# Patient Record
Sex: Female | Born: 1960 | Race: White | Hispanic: No | Smoking: Former smoker
Health system: Southern US, Community
[De-identification: ages and names within clinical notes are randomized; demographics above are authoritative.]

## PROBLEM LIST (undated history)

## (undated) DIAGNOSIS — D131 Benign neoplasm of stomach: Secondary | ICD-10-CM

## (undated) DIAGNOSIS — F419 Anxiety disorder, unspecified: Secondary | ICD-10-CM

## (undated) DIAGNOSIS — R42 Dizziness and giddiness: Secondary | ICD-10-CM

## (undated) DIAGNOSIS — M1990 Unspecified osteoarthritis, unspecified site: Secondary | ICD-10-CM

## (undated) DIAGNOSIS — Z9221 Personal history of antineoplastic chemotherapy: Secondary | ICD-10-CM

## (undated) DIAGNOSIS — K219 Gastro-esophageal reflux disease without esophagitis: Secondary | ICD-10-CM

## (undated) DIAGNOSIS — C569 Malignant neoplasm of unspecified ovary: Secondary | ICD-10-CM

## (undated) DIAGNOSIS — K8020 Calculus of gallbladder without cholecystitis without obstruction: Secondary | ICD-10-CM

## (undated) HISTORY — PX: DIAGNOSTIC LAPAROSCOPY: SUR761

## (undated) HISTORY — PX: LAPAROSCOPIC OOPHERECTOMY: SHX6507

## (undated) HISTORY — DX: Unspecified osteoarthritis, unspecified site: M19.90

## (undated) HISTORY — DX: Dizziness and giddiness: R42

## (undated) HISTORY — DX: Malignant neoplasm of unspecified ovary: C56.9

## (undated) HISTORY — PX: CHOLECYSTECTOMY: SHX55

## (undated) HISTORY — PX: COLONOSCOPY: SHX5424

## (undated) HISTORY — PX: UPPER GI ENDOSCOPY: SHX6162

## (undated) HISTORY — DX: Gastro-esophageal reflux disease without esophagitis: K21.9

## (undated) HISTORY — DX: Benign neoplasm of stomach: D13.1

## (undated) HISTORY — DX: Calculus of gallbladder without cholecystitis without obstruction: K80.20

## (undated) HISTORY — PX: OOPHORECTOMY: SHX86

## (undated) HISTORY — DX: Anxiety disorder, unspecified: F41.9

---

## 1999-06-30 ENCOUNTER — Other Ambulatory Visit: Admission: RE | Admit: 1999-06-30 | Discharge: 1999-06-30 | Payer: Self-pay | Admitting: Obstetrics and Gynecology

## 2000-09-28 ENCOUNTER — Other Ambulatory Visit: Admission: RE | Admit: 2000-09-28 | Discharge: 2000-09-28 | Payer: Self-pay | Admitting: Obstetrics and Gynecology

## 2001-10-16 ENCOUNTER — Other Ambulatory Visit: Admission: RE | Admit: 2001-10-16 | Discharge: 2001-10-16 | Payer: Self-pay | Admitting: Obstetrics and Gynecology

## 2002-11-06 ENCOUNTER — Other Ambulatory Visit: Admission: RE | Admit: 2002-11-06 | Discharge: 2002-11-06 | Payer: Self-pay | Admitting: Obstetrics and Gynecology

## 2003-12-10 ENCOUNTER — Other Ambulatory Visit: Admission: RE | Admit: 2003-12-10 | Discharge: 2003-12-10 | Payer: Self-pay | Admitting: Obstetrics and Gynecology

## 2004-03-31 ENCOUNTER — Ambulatory Visit: Payer: Self-pay | Admitting: Family Medicine

## 2004-06-03 ENCOUNTER — Emergency Department (HOSPITAL_COMMUNITY): Admission: EM | Admit: 2004-06-03 | Discharge: 2004-06-03 | Payer: Self-pay | Admitting: Emergency Medicine

## 2004-09-10 ENCOUNTER — Ambulatory Visit: Payer: Self-pay | Admitting: Internal Medicine

## 2005-02-17 ENCOUNTER — Other Ambulatory Visit: Admission: RE | Admit: 2005-02-17 | Discharge: 2005-02-17 | Payer: Self-pay | Admitting: Obstetrics and Gynecology

## 2006-03-13 ENCOUNTER — Ambulatory Visit: Payer: Self-pay | Admitting: Family Medicine

## 2006-03-15 ENCOUNTER — Ambulatory Visit: Payer: Self-pay | Admitting: Family Medicine

## 2006-08-23 DIAGNOSIS — H019 Unspecified inflammation of eyelid: Secondary | ICD-10-CM | POA: Insufficient documentation

## 2006-08-25 ENCOUNTER — Ambulatory Visit: Payer: Self-pay | Admitting: *Deleted

## 2006-08-25 DIAGNOSIS — R42 Dizziness and giddiness: Secondary | ICD-10-CM | POA: Insufficient documentation

## 2006-12-07 ENCOUNTER — Ambulatory Visit: Payer: Self-pay | Admitting: Family Medicine

## 2006-12-07 DIAGNOSIS — S90129A Contusion of unspecified lesser toe(s) without damage to nail, initial encounter: Secondary | ICD-10-CM | POA: Insufficient documentation

## 2006-12-07 DIAGNOSIS — L408 Other psoriasis: Secondary | ICD-10-CM | POA: Insufficient documentation

## 2007-05-01 ENCOUNTER — Telehealth: Payer: Self-pay | Admitting: Family Medicine

## 2007-05-01 ENCOUNTER — Ambulatory Visit: Payer: Self-pay | Admitting: Family Medicine

## 2007-05-01 DIAGNOSIS — K219 Gastro-esophageal reflux disease without esophagitis: Secondary | ICD-10-CM | POA: Insufficient documentation

## 2007-07-02 ENCOUNTER — Ambulatory Visit: Payer: Self-pay | Admitting: Family Medicine

## 2007-07-02 DIAGNOSIS — H6990 Unspecified Eustachian tube disorder, unspecified ear: Secondary | ICD-10-CM | POA: Insufficient documentation

## 2007-07-02 DIAGNOSIS — H6980 Other specified disorders of Eustachian tube, unspecified ear: Secondary | ICD-10-CM | POA: Insufficient documentation

## 2008-01-02 ENCOUNTER — Ambulatory Visit: Payer: Self-pay | Admitting: Family Medicine

## 2008-01-02 DIAGNOSIS — R300 Dysuria: Secondary | ICD-10-CM | POA: Insufficient documentation

## 2008-01-02 LAB — CONVERTED CEMR LAB
Bilirubin Urine: NEGATIVE
Blood in Urine, dipstick: NEGATIVE
Glucose, Urine, Semiquant: NEGATIVE
Ketones, urine, test strip: NEGATIVE
Protein, U semiquant: NEGATIVE
Specific Gravity, Urine: 1.015
WBC Urine, dipstick: NEGATIVE
pH: 5

## 2008-01-04 ENCOUNTER — Encounter: Payer: Self-pay | Admitting: Family Medicine

## 2008-11-05 ENCOUNTER — Ambulatory Visit: Payer: Self-pay | Admitting: Family Medicine

## 2008-11-05 LAB — CONVERTED CEMR LAB
Bilirubin Urine: NEGATIVE
Glucose, Urine, Semiquant: NEGATIVE
Ketones, urine, test strip: NEGATIVE
RBC / HPF: 0
Specific Gravity, Urine: <1.005
pH: 7

## 2008-11-06 ENCOUNTER — Encounter: Payer: Self-pay | Admitting: Family Medicine

## 2008-11-21 ENCOUNTER — Ambulatory Visit: Payer: Self-pay | Admitting: Family Medicine

## 2008-11-21 DIAGNOSIS — M542 Cervicalgia: Secondary | ICD-10-CM | POA: Insufficient documentation

## 2009-01-23 ENCOUNTER — Telehealth: Payer: Self-pay | Admitting: Family Medicine

## 2010-06-11 ENCOUNTER — Telehealth: Payer: Self-pay | Admitting: Family Medicine

## 2010-06-15 NOTE — Progress Notes (Signed)
Summary: cough  Phone Note Call from Patient Call back at Home Phone [phone]   Caller: Patient Call For: Dr. Sharen Hones  Summary of Call: Patient says that she has had a dry cough that started early this week, is having no other symptoms. She is asking what she should take. Uses [facility] if needed.  Initial call taken by: Melody Comas,  June 11, 2010 8:54 AM  Follow-up for Phone Call        may try delsym or robitussin OTC.  no h/o HTN, unsure if smoker.  if not better, let us know. Follow-up by: Eustaquio Boyden  MD,  June 11, 2010 10:32 AM  Additional Follow-up for Phone Call Additional follow up Details #1::        Patient notified.  Additional Follow-up by: Melody Comas,  June 11, 2010 10:33 AM       Past History:  Past Medical History: Last updated: 07/02/2007 Dizziness or vertigo GERD PMH-FH-SH reviewed for relevance

## 2010-07-23 ENCOUNTER — Other Ambulatory Visit: Payer: Self-pay | Admitting: *Deleted

## 2010-07-23 MED ORDER — PANTOPRAZOLE SODIUM 40 MG PO TBEC: 40.0000 mg | DELAYED_RELEASE_TABLET | Freq: Every day | ORAL | Status: DC

## 2010-08-07 ENCOUNTER — Encounter: Payer: Self-pay | Admitting: Family Medicine

## 2010-08-10 ENCOUNTER — Ambulatory Visit: Payer: Self-pay | Admitting: Family Medicine

## 2010-08-13 ENCOUNTER — Encounter: Payer: Self-pay | Admitting: Family Medicine

## 2010-08-13 ENCOUNTER — Ambulatory Visit (INDEPENDENT_AMBULATORY_CARE_PROVIDER_SITE_OTHER): Payer: BC Managed Care – PPO | Admitting: Family Medicine

## 2010-08-13 DIAGNOSIS — K209 Esophagitis, unspecified without bleeding: Secondary | ICD-10-CM

## 2010-08-13 MED ORDER — PANTOPRAZOLE SODIUM 40 MG PO TBEC: 40.0000 mg | DELAYED_RELEASE_TABLET | Freq: Every day | ORAL | Status: DC

## 2010-08-13 NOTE — Assessment & Plan Note (Signed)
Doing well with protonix- does become symptomatic if she stops it  Will refil Disc inc in risk of OP with PPIs- and she voiced understanding  Will up the ca and vit D for that  Will disc screening with her gyn  refil for the year Disc diet for gerd  Will try to mt wt and exercise

## 2010-08-13 NOTE — Progress Notes (Signed)
  Subjective:    Patient ID: Debbie Richardson, female    DOB: [birth year], 50 y.o.   MRN: [id number]  HPI Here for f/u of GERD Needs a refil on her protonix -it works great  She can tell a big difference in how she feels - no more burning (symptoms were pretty severe in past)  If she goes off of it - entire digestive tract is messed up   Takes calcium and drinks a lot of milk  That has vitamin D in it   Sees gyn for annual exam and pap and OC - Dr Henderson Cloud  Going through some perimenopause -- and still has menses on her OC  Tried a different pill with less estrogen and that did not work --so back on yaz   Past Medical History  Diagnosis Date  . Dizziness     vertigo  . GERD (gastroesophageal reflux disease)     History   Social History  . Marital Status: Single    Spouse Name: N/A    Number of Children: 1  . Years of Education: N/A   Occupational History  . Not on file.   Social History Main Topics  . Smoking status: Never Smoker   . Smokeless tobacco: Not on file  . Alcohol Use: Not on file  . Drug Use: Not on file  . Sexually Active: Not on file   Other Topics Concern  . Not on file   Social History Narrative  . No narrative on file    Family History  Problem Relation Age of Onset  . Hypertension Mother   . Hyperlipidemia Mother   . GER disease Mother         Review of Systems Review of Systems  Constitutional: Negative for fever, appetite change, fatigue and unexpected weight change.  Eyes: Negative for pain and visual disturbance.  Respiratory: Negative for cough and shortness of breath.   Cardiovascular: Negative.   Gastrointestinal: Negative for nausea, diarrhea and constipation.neg for trouble swallowing   Genitourinary: Negative for urgency and frequency.  Skin: Negative for pallor.  Neurological: Negative for weakness, light-headedness, numbness and headaches.  Hematological: Negative for adenopathy. Does not bruise/bleed easily.    Psychiatric/Behavioral: Negative for dysphoric mood. The patient is not nervous/anxious.          Objective:   Physical Exam  Constitutional: She appears well-developed and well-nourished.  HENT:  Head: Normocephalic and atraumatic.  Mouth/Throat: Oropharynx is clear and moist.  Eyes: Conjunctivae and EOM are normal. Pupils are equal, round, and reactive to light.  Neck: No JVD present. No thyromegaly present.  Cardiovascular: Normal rate and regular rhythm.   Pulmonary/Chest: Effort normal and breath sounds normal. She has no wheezes.  Abdominal: Soft. Bowel sounds are normal. She exhibits no distension and no mass. There is no tenderness.  Lymphadenopathy:    She has no cervical adenopathy.  Neurological: She is alert. She has normal reflexes.  Skin: Skin is warm and dry.  Psychiatric: She has a normal mood and affect.          Assessment & Plan:

## 2010-08-13 NOTE — Patient Instructions (Signed)
Continue the protonix Try to get 1200-1500 mg of calcium per day with at least 1000 iu of vitamin D - for bone health  Avoid spicy foods and caffeine / and acidic foods with acid reflux Let me know if any problems

## 2011-04-01 ENCOUNTER — Ambulatory Visit (INDEPENDENT_AMBULATORY_CARE_PROVIDER_SITE_OTHER): Payer: BC Managed Care – PPO | Admitting: Family Medicine

## 2011-04-01 ENCOUNTER — Encounter: Payer: Self-pay | Admitting: Family Medicine

## 2011-04-01 VITALS — BP 110/80 | HR 85 | Temp 97.7°F | Wt 130.0 lb

## 2011-04-01 DIAGNOSIS — J069 Acute upper respiratory infection, unspecified: Secondary | ICD-10-CM

## 2011-04-01 MED ORDER — AZITHROMYCIN 250 MG PO TABS: ORAL_TABLET | ORAL | Status: AC

## 2011-04-01 NOTE — Progress Notes (Signed)
SUBJECTIVE:  Debbie Richardson is a 50 y.o. female who complains of coryza, congestion and bilateral sinus pain for 25 days. She denies a history of anorexia, chest pain, fatigue, shortness of breath, weakness, weight loss and wheezing and denies a history of asthma. Patient denies smoke cigarettes.   Patient Active Problem List  Diagnoses  . GERD  . PSORIASIS NEC  . NECK PAIN   Past Medical History  Diagnosis Date  . Dizziness     vertigo  . GERD (gastroesophageal reflux disease)    Past Surgical History  Procedure Date  . Cesarean section   . Cholecystectomy    History  Substance Use Topics  . Smoking status: Never Smoker   . Smokeless tobacco: Not on file  . Alcohol Use: Not on file   Family History  Problem Relation Age of Onset  . Hypertension Mother   . Hyperlipidemia Mother   . GER disease Mother    No Known Allergies Current Outpatient Prescriptions on File Prior to Visit  Medication Sig Dispense Refill  . drospirenone-ethinyl estradiol (YAZ) 3-0.02 MG per tablet Take 1 tablet by mouth daily.        . pantoprazole (PROTONIX) 40 MG tablet Take 1 tablet (40 mg total) by mouth daily.  30 tablet  11   The PMH, PSH, Social History, Family History, Medications, and allergies have been reviewed in [facility], and have been updated if relevant.  OBJECTIVE: BP 110/80  Pulse 85  Temp(Src) 97.7 F (36.5 C) (Oral)  Wt 130 lb (58.968 kg)  LMP 02/28/2011  She appears well, vital signs are as noted. TMs retracted bilaterally.  Throat and pharynx normal.  Neck supple. No adenopathy in the neck. Nose is congested. Sinuses tender to palp throughout. The chest is clear, without wheezes or rales.  ASSESSMENT:  sinusitis  PLAN: Given duration and progression of symptoms, will treat for bacterial sinusitis with Zpack (states she cannot tolerate GI side effects of amox or augmentin). Symptomatic therapy suggested: push fluids, rest and return office visit prn if symptoms persist or  worsen. Call or return to clinic prn if these symptoms worsen or fail to improve as anticipated.

## 2011-04-01 NOTE — Patient Instructions (Signed)
Hope you feel better soon.  Have a Happy New Year.  Take antibiotic as directed.  Drink lots of fluids.  Treat sympotmatically with Mucinex, nasal saline irrigation, and Tylenol/Ibuprofen. Also try claritin D or zyrtec D over the counter- two times a day as needed ( have to sign for them at pharmacy). You can use warm compresses.  Cough suppressant at night. Call if not improving as expected in 5-7 days.

## 2011-09-19 ENCOUNTER — Other Ambulatory Visit: Payer: Self-pay | Admitting: Family Medicine

## 2011-09-19 NOTE — Telephone Encounter (Signed)
Done 30-day supply; Pt overdue for OV/SLS

## 2011-11-09 ENCOUNTER — Other Ambulatory Visit: Payer: Self-pay | Admitting: Family Medicine

## 2011-11-09 NOTE — Telephone Encounter (Signed)
Last OV was 04/01/11. Last filled 09/19/11. Ok to refill?

## 2011-11-09 NOTE — Telephone Encounter (Signed)
Will refill electronically  

## 2011-12-16 ENCOUNTER — Encounter: Payer: Self-pay | Admitting: Internal Medicine

## 2011-12-16 ENCOUNTER — Ambulatory Visit (INDEPENDENT_AMBULATORY_CARE_PROVIDER_SITE_OTHER): Payer: BC Managed Care – PPO | Admitting: Internal Medicine

## 2011-12-16 VITALS — BP 112/72 | HR 86 | Temp 98.3°F | Wt 126.2 lb

## 2011-12-16 DIAGNOSIS — R300 Dysuria: Secondary | ICD-10-CM

## 2011-12-16 DIAGNOSIS — N390 Urinary tract infection, site not specified: Secondary | ICD-10-CM | POA: Insufficient documentation

## 2011-12-16 LAB — POCT URINALYSIS DIPSTICK: Spec Grav, UA: 1.01

## 2011-12-16 MED ORDER — CIPROFLOXACIN HCL 500 MG PO TABS: 500.0000 mg | ORAL_TABLET | Freq: Two times a day (BID) | ORAL | Status: AC

## 2011-12-16 NOTE — Assessment & Plan Note (Signed)
Not clear if cystitis only or UTI Will treat with cipro If goes away right away, may only need 3 days

## 2011-12-16 NOTE — Progress Notes (Signed)
  Subjective:    Patient ID: Debbie Richardson, female    DOB: [birth year], 51 y.o.   MRN: [id number]  HPI Feels like she has UTI Started with suprapubic pain and pressure for 2--3 days Tried some azo---helped and then stopped---the pain then recurred   Has had sporadic cystitis in past May have had low grade temp today May have seen a little blood this AM  Some burning dysuria Urgency also Has increased her fluids  Current Outpatient Prescriptions on File Prior to Visit  Medication Sig Dispense Refill  . drospirenone-ethinyl estradiol (YAZ) 3-0.02 MG per tablet Take 1 tablet by mouth daily.        . pantoprazole (PROTONIX) 40 MG tablet TAKE ONE (1) TABLET BY MOUTH EVERY      DAY  30 tablet  5    No Known Allergies  Past Medical History  Diagnosis Date  . Dizziness     vertigo  . GERD (gastroesophageal reflux disease)     Past Surgical History  Procedure Date  . Cesarean section   . Cholecystectomy     Family History  Problem Relation Age of Onset  . Hypertension Mother   . Hyperlipidemia Mother   . GER disease Mother     History   Social History  . Marital Status: Single    Spouse Name: N/A    Number of Children: 1  . Years of Education: N/A   Occupational History  . Not on file.   Social History Main Topics  . Smoking status: Never Smoker   . Smokeless tobacco: Not on file  . Alcohol Use: Not on file  . Drug Use: Not on file  . Sexually Active: Not on file   Other Topics Concern  . Not on file   Social History Narrative  . No narrative on file   Review of Systems No nausea or vomiting Last intercourse about 2 weeks ago--no problems Periods still regular    Objective:   Physical Exam  Constitutional: She appears well-developed and well-nourished. No distress.  Abdominal: Soft. She exhibits no distension.       Mild suprapubic tenderness  Musculoskeletal:       Slight left CVA tenderness          Assessment & Plan:

## 2012-05-07 ENCOUNTER — Ambulatory Visit (INDEPENDENT_AMBULATORY_CARE_PROVIDER_SITE_OTHER): Payer: BC Managed Care – PPO | Admitting: Family Medicine

## 2012-05-07 ENCOUNTER — Encounter: Payer: Self-pay | Admitting: Family Medicine

## 2012-05-07 VITALS — BP 122/82 | HR 74 | Temp 98.2°F | Ht 62.5 in | Wt 126.8 lb

## 2012-05-07 DIAGNOSIS — B309 Viral conjunctivitis, unspecified: Secondary | ICD-10-CM

## 2012-05-07 MED ORDER — PANTOPRAZOLE SODIUM 40 MG PO TBEC: 40.0000 mg | DELAYED_RELEASE_TABLET | Freq: Every day | ORAL | Status: DC

## 2012-05-07 NOTE — Assessment & Plan Note (Signed)
Suspect assoc with mild uri  Disc symptomatic care - see instructions on AVS  If worse - disc s/s of bacterial infx to watch for incl swelling/ colored eye d/c and inc pain  Update if not starting to improve in a week or if worsening

## 2012-05-07 NOTE — Progress Notes (Signed)
  Subjective:    Patient ID: Debbie Richardson, female    DOB: 05/30/1960, 52 y.o.   MRN: [id number]  HPI Here for eye problems   Started to bother her last week  R worse than the left  Itching and burning and also some swelling under the eye (puffiness) Had some sinus symptoms that are improved  Not too red Some discharge also dry crusting in the am   Used some allergy drops  No vision change   No known pink eye exposure   Patient Active Problem List  Diagnosis  . GERD  . PSORIASIS NEC  . NECK PAIN  . UTI (lower urinary tract infection)   Past Medical History  Diagnosis Date  . Dizziness     vertigo  . GERD (gastroesophageal reflux disease)    Past Surgical History  Procedure Date  . Cesarean section   . Cholecystectomy    History  Substance Use Topics  . Smoking status: Never Smoker   . Smokeless tobacco: Not on file  . Alcohol Use: No   Family History  Problem Relation Age of Onset  . Hypertension Mother   . Hyperlipidemia Mother   . GER disease Mother    No Known Allergies Current Outpatient Prescriptions on File Prior to Visit  Medication Sig Dispense Refill  . levonorgestrel-ethinyl estradiol (PORTIA-28) 0.15-30 MG-MCG tablet Take 1 tablet by mouth daily.      . pantoprazole (PROTONIX) 40 MG tablet TAKE ONE (1) TABLET BY MOUTH EVERY      DAY  30 tablet  5      Review of Systems Review of Systems  Constitutional: Negative for fever, appetite change, fatigue and unexpected weight change.  Eyes: Negative for pain and visual disturbance. pos for irritation/ redness and d/c ENT pos for some congestion and post nasal drip  Respiratory: Negative for cough and shortness of breath.   Cardiovascular: Negative for cp or palpitations    Gastrointestinal: Negative for nausea, diarrhea and constipation.  Genitourinary: Negative for urgency and frequency.  Skin: Negative for pallor or rash   Neurological: Negative for weakness, light-headedness, numbness and  headaches.  Hematological: Negative for adenopathy. Does not bruise/bleed easily.  Psychiatric/Behavioral: Negative for dysphoric mood. The patient is not nervous/anxious.         Objective:   Physical Exam  Constitutional: She appears well-developed and well-nourished. No distress.  HENT:  Head: Normocephalic and atraumatic.  Right Ear: External ear normal.  Left Ear: External ear normal.  Mouth/Throat: Oropharynx is clear and moist. No oropharyngeal exudate.       Nares are boggy  Eyes: EOM are normal. Pupils are equal, round, and reactive to light. Right eye exhibits discharge. Left eye exhibits discharge. No scleral icterus.       bilat conj injection (mild) worse on R Clear d/c and tearing  She is wearing eye makeup Nl vision No lid swelling or redness   Neck: Normal range of motion. Neck supple.  Cardiovascular: Normal rate and regular rhythm.   Pulmonary/Chest: Effort normal and breath sounds normal. She has no wheezes.  Lymphadenopathy:    She has no cervical adenopathy.  Neurological: She is alert.  Skin: Skin is warm and dry. No rash noted.  Psychiatric: She has a normal mood and affect.          Assessment & Plan:

## 2012-05-07 NOTE — Patient Instructions (Addendum)
I think you have viral conjunctivitis  Use saline / artificial tears  Wash hands frequently and no not touch eyes  Throw out any eye makeup you have worn and buy new when you are better  Cool compresses help itch and irritation Warm washcloth in am - will help get the gunk out of your lashes  If worse/ pain/swelling/ discharge or any vision change - call and let me know

## 2012-07-04 ENCOUNTER — Ambulatory Visit (INDEPENDENT_AMBULATORY_CARE_PROVIDER_SITE_OTHER): Payer: BC Managed Care – PPO | Admitting: Family Medicine

## 2012-07-04 ENCOUNTER — Encounter: Payer: Self-pay | Admitting: Family Medicine

## 2012-07-04 VITALS — BP 110/70 | HR 92 | Temp 98.4°F | Ht 62.5 in | Wt 128.0 lb

## 2012-07-04 DIAGNOSIS — H1013 Acute atopic conjunctivitis, bilateral: Secondary | ICD-10-CM

## 2012-07-04 DIAGNOSIS — J309 Allergic rhinitis, unspecified: Secondary | ICD-10-CM

## 2012-07-04 DIAGNOSIS — H1010 Acute atopic conjunctivitis, unspecified eye: Secondary | ICD-10-CM | POA: Insufficient documentation

## 2012-07-04 DIAGNOSIS — J019 Acute sinusitis, unspecified: Secondary | ICD-10-CM

## 2012-07-04 DIAGNOSIS — B9689 Other specified bacterial agents as the cause of diseases classified elsewhere: Secondary | ICD-10-CM

## 2012-07-04 MED ORDER — OLOPATADINE HCL 0.2 % OP SOLN: 1.0000 [drp] | Freq: Two times a day (BID) | OPHTHALMIC | Status: DC

## 2012-07-04 MED ORDER — FLUTICASONE PROPIONATE 50 MCG/ACT NA SUSP: 2.0000 | Freq: Every day | NASAL | Status: DC

## 2012-07-04 MED ORDER — AMOXICILLIN 500 MG PO CAPS: 500.0000 mg | ORAL_CAPSULE | Freq: Three times a day (TID) | ORAL | Status: DC

## 2012-07-04 NOTE — Patient Instructions (Addendum)
Use the eye drops twice daily one drop in each eye - for allergic conjunctivitis Try the flonase for allergies  Take the amoxicillin for sinus infection Update if not starting to improve in a week or if worsening

## 2012-07-04 NOTE — Assessment & Plan Note (Signed)
Ongoing intermittent mild eye itching and also congestion / drip Trial of flonase since pt does not like how dry she gets with claritin pataday drops Update if worse or not imp

## 2012-07-04 NOTE — Progress Notes (Signed)
Subjective:    Patient ID: Debbie Richardson, female    DOB: 02/05/1961, 52 y.o.   MRN: [id number]  HPI Her symptoms of presumed viral conjunctivitis - waxed and waned and not totally gone In the interim seen by dentist for facial pain - wonders about sinus or allergy problems   Worse in the L eye , is getting cloudy d/c from it - not crusting shut  At times is puffy (aleve helps this )  Other eye is a little irritated  Lower lids are the biggest problem Has thrown out all makeup   Last week thought she has a bit of a cold ? Allergies Took claritin - dried her out too much   Constant nose blowing  Itchy nose and throat  Throat feels full/not sore Ears were full last week   A little yellow nasal discharge  Sinus pain and pressure in R maxillary sinus -this continues with pressure in gums and mouth   No fever   Patient Active Problem List  Diagnosis  . GERD  . PSORIASIS NEC  . Viral conjunctivitis   Past Medical History  Diagnosis Date  . Dizziness     vertigo  . GERD (gastroesophageal reflux disease)    Past Surgical History  Procedure Laterality Date  . Cesarean section    . Cholecystectomy     History  Substance Use Topics  . Smoking status: Never Smoker   . Smokeless tobacco: Not on file  . Alcohol Use: No   Family History  Problem Relation Age of Onset  . Hypertension Mother   . Hyperlipidemia Mother   . GER disease Mother    No Known Allergies Current Outpatient Prescriptions on File Prior to Visit  Medication Sig Dispense Refill  . levonorgestrel-ethinyl estradiol (PORTIA-28) 0.15-30 MG-MCG tablet Take 1 tablet by mouth daily.      . pantoprazole (PROTONIX) 40 MG tablet Take 1 tablet (40 mg total) by mouth daily.  30 tablet  11   No current facility-administered medications on file prior to visit.      Review of Systems Review of Systems  Constitutional: Negative for fever, appetite change, fatigue and unexpected weight change.  ENT pos for  congestion/ facial puffiness/ sinus pain , neg for ear pain or st Eyes: Negative for pain and visual disturbance. pos for itching and slt redness of eyes Respiratory: Negative for cough and shortness of breath.  neg for wheeze  Cardiovascular: Negative for cp or palpitations    Gastrointestinal: Negative for nausea, diarrhea and constipation.  Genitourinary: Negative for urgency and frequency.  Skin: Negative for pallor or rash   Neurological: Negative for weakness, light-headedness, numbness and headaches.  Hematological: Negative for adenopathy. Does not bruise/bleed easily.  Psychiatric/Behavioral: Negative for dysphoric mood. The patient is not nervous/anxious.         Objective:   Physical Exam  Constitutional: She appears well-developed and well-nourished. No distress.  HENT:  Head: Normocephalic and atraumatic.  Right Ear: External ear normal.  Left Ear: External ear normal.  Mouth/Throat: Oropharynx is clear and moist. No oropharyngeal exudate.  Nares are injected and congested  R ethmoid and maxillary sinus tenderness Some clear rhinorrhea   Eyes: EOM are normal. Pupils are equal, round, and reactive to light. Right eye exhibits no discharge. Left eye exhibits no discharge. No scleral icterus.  Very mild conj injection bilat  R lower lid is slt pink No dishcarge  Neck: Normal range of motion. Neck supple.  Cardiovascular: Normal rate,  regular rhythm and normal heart sounds.   Pulmonary/Chest: Effort normal and breath sounds normal. No respiratory distress. She has no wheezes. She has no rales.  Musculoskeletal: She exhibits no edema.  Lymphadenopathy:    She has no cervical adenopathy.  Neurological: She is alert. No cranial nerve deficit.  Skin: Skin is warm and dry. No rash noted. No erythema. No pallor.  Psychiatric: She has a normal mood and affect.          Assessment & Plan:

## 2012-07-04 NOTE — Assessment & Plan Note (Signed)
Seems to be R maxillary sinus -pain/ pressure and some purulent d/c in setting of several uri and also all rhinitis tx with amox (augmentin upsets stomach) Disc symptomatic care - see instructions on AVS  Update if not starting to improve in a week or if worsening   Will also try flonase for congestion

## 2013-03-19 ENCOUNTER — Other Ambulatory Visit: Payer: Self-pay | Admitting: Obstetrics and Gynecology

## 2013-03-19 DIAGNOSIS — R928 Other abnormal and inconclusive findings on diagnostic imaging of breast: Secondary | ICD-10-CM

## 2013-04-02 ENCOUNTER — Ambulatory Visit
Admission: RE | Admit: 2013-04-02 | Discharge: 2013-04-02 | Disposition: A | Discharge status: Home / Self Care | Payer: BC Managed Care – PPO | Source: Ambulatory Visit | Attending: Obstetrics and Gynecology | Admitting: Obstetrics and Gynecology

## 2013-04-02 DIAGNOSIS — R928 Other abnormal and inconclusive findings on diagnostic imaging of breast: Secondary | ICD-10-CM

## 2013-07-10 ENCOUNTER — Other Ambulatory Visit: Payer: Self-pay | Admitting: Family Medicine

## 2013-07-11 NOTE — Telephone Encounter (Signed)
Pt does not have any recent appts or future appts scheduled, electronic RX request. Please advise.

## 2013-07-11 NOTE — Telephone Encounter (Signed)
Please refill for 6 month

## 2013-07-12 NOTE — Telephone Encounter (Signed)
done

## 2013-08-19 ENCOUNTER — Ambulatory Visit (INDEPENDENT_AMBULATORY_CARE_PROVIDER_SITE_OTHER): Payer: BC Managed Care – PPO | Admitting: Family Medicine

## 2013-08-19 ENCOUNTER — Encounter: Payer: Self-pay | Admitting: Family Medicine

## 2013-08-19 VITALS — BP 110/64 | HR 78 | Temp 98.5°F | Ht 62.5 in | Wt 132.0 lb

## 2013-08-19 DIAGNOSIS — Z23 Encounter for immunization: Secondary | ICD-10-CM

## 2013-08-19 DIAGNOSIS — S61411A Laceration without foreign body of right hand, initial encounter: Secondary | ICD-10-CM | POA: Insufficient documentation

## 2013-08-19 DIAGNOSIS — S61409A Unspecified open wound of unspecified hand, initial encounter: Secondary | ICD-10-CM

## 2013-08-19 MED ORDER — CEPHALEXIN 500 MG PO CAPS: 500.0000 mg | ORAL_CAPSULE | Freq: Three times a day (TID) | ORAL | Status: DC

## 2013-08-19 NOTE — Assessment & Plan Note (Signed)
From injury over 3 days ago  Is clean with scant redness Update Tdap  tx with keflex  Reassuring exam Disc dressing /will use abx ointment and clean with abx soap and water Adv not to submerge  Update if not starting to improve in a week or if worsening

## 2013-08-19 NOTE — Patient Instructions (Signed)
Keep wound clean with antibacterial soap and water  Use antibiotic ointment and cover it lightly to protect it  Take keflex as directed  If worse/red/swollen/drainge or fever - please alert me  Tetanus shot today

## 2013-08-19 NOTE — Progress Notes (Signed)
   Subjective:    Patient ID: Debbie Richardson, female    DOB: 10-05-1960, 53 y.o.   MRN: [id number]  HPI Here with R hand injury   Cut her hand cleaning out a garage - Friday  A pc of metal/ scrap - probably dirty  It did not bleed too much  Cleaned it with peroxide and neosporin on it   A little swollen today (had also been walking on the beach)   Needs Tetanus  update   Scant redness around it   Patient Active Problem List   Diagnosis Date Noted  . Allergic conjunctivitis and rhinitis 07/04/2012  . Acute bacterial sinusitis 07/04/2012  . GERD 05/01/2007  . PSORIASIS NEC 12/07/2006   Past Medical History  Diagnosis Date  . Dizziness     vertigo  . GERD (gastroesophageal reflux disease)    Past Surgical History  Procedure Laterality Date  . Cesarean section    . Cholecystectomy     History  Substance Use Topics  . Smoking status: Never Smoker   . Smokeless tobacco: Not on file  . Alcohol Use: No   Family History  Problem Relation Age of Onset  . Hypertension Mother   . Hyperlipidemia Mother   . GER disease Mother    Allergies  Allergen Reactions  . Ciprofloxacin    Current Outpatient Prescriptions on File Prior to Visit  Medication Sig Dispense Refill  . pantoprazole (PROTONIX) 40 MG tablet TAKE ONE (1) TABLET BY MOUTH EVERY DAY  30 tablet  5   No current facility-administered medications on file prior to visit.     Review of Systems Review of Systems  Constitutional: Negative for fever, appetite change, fatigue and unexpected weight change.  Eyes: Negative for pain and visual disturbance.  Respiratory: Negative for cough and shortness of breath.   Cardiovascular: Negative for cp or palpitations    Gastrointestinal: Negative for nausea, diarrhea and constipation.  Genitourinary: Negative for urgency and frequency.  Skin: Negative for pallor or rash  pos for laceration that is sore  Neurological: Negative for weakness, light-headedness, numbness  and headaches.  Hematological: Negative for adenopathy. Does not bruise/bleed easily.  Psychiatric/Behavioral: Negative for dysphoric mood. The patient is not nervous/anxious.         Objective:   Physical Exam  Constitutional: She appears well-developed and well-nourished. No distress.  HENT:  Head: Normocephalic and atraumatic.  Eyes: Conjunctivae and EOM are normal. Pupils are equal, round, and reactive to light.  Cardiovascular: Normal rate and regular rhythm.   Musculoskeletal:  No acute joint changes  Nl rom of R hand   Neurological: She is alert. No sensory deficit.  Skin: Skin is warm and dry. There is erythema.  1.5 cm laceration on dorsal lateral R  hand above thumb Appears clean with wound edges well approximated  Scant 1-2 mm erythema surrounding with scant swelling  Nl rom hand / tendon w/o pain  Nl perf and sensation   Psychiatric: She has a normal mood and affect.          Assessment & Plan:

## 2013-08-19 NOTE — Progress Notes (Signed)
Pre visit review using our clinic review tool, if applicable. No additional management support is needed unless otherwise documented below in the visit note. 

## 2014-01-15 ENCOUNTER — Telehealth: Payer: Self-pay | Admitting: Family Medicine

## 2014-01-15 NOTE — Telephone Encounter (Signed)
Pt notified of Dr. Tower's comments/recommendations and verbalized understanding  

## 2014-01-15 NOTE — Telephone Encounter (Signed)
Get some otc zantac and take 150 mg now and tonight and tomorrow am  Continue protonix  Will see her tomorrow

## 2014-01-15 NOTE — Telephone Encounter (Signed)
Patient Information:  Caller Name: Baldo Ash  Phone: [phone]  Patient: Debbie Richardson  Gender: Female  DOB: [birth year]  Age: 53 Years  PCP: Tower, [facility], [facility])  Pregnant: No  Office Follow Up:  Does the office need to follow up with this patient?: Yes  Instructions For The Office: Patient requesting to seen today or consideration of change in medication  RN Note:  Patient calling with c/o heartburn.  Currently taking Protonix 40 mg, but minimal relief last PM.  Also c/o mild diarrhea.  All triage negative.  Appointment is scheduled for 01/16/14 for visit.  Advised to eat bland diet, small frequent meals.  Patient requesting consideration of increase in medication verses visit today.  Symptoms  Reason For Call & Symptoms: heartburn, diarrhea  Reviewed Health History In EMR: Yes  Reviewed Medications In EMR: Yes  Reviewed Allergies In EMR: Yes  Reviewed Surgeries / Procedures: Yes  Date of Onset of Symptoms: 01/15/2014 OB / GYN:  LMP: Unknown  Guideline(s) Used:  Acid Indigestion, Heartburn, and Sour Stomach  Chest Pain  No Protocol Available - Sick Adult  Disposition Per Guideline:   See Today or Tomorrow in Office  Reason For Disposition Reached:   Nursing judgment  Advice Given:  N/A  Patient Will Follow Care Advice:  YES

## 2014-01-16 ENCOUNTER — Ambulatory Visit (INDEPENDENT_AMBULATORY_CARE_PROVIDER_SITE_OTHER): Payer: BC Managed Care – PPO | Admitting: Family Medicine

## 2014-01-16 ENCOUNTER — Encounter: Payer: Self-pay | Admitting: Family Medicine

## 2014-01-16 VITALS — BP 120/78 | HR 87 | Temp 98.3°F | Ht 62.5 in | Wt 128.5 lb

## 2014-01-16 DIAGNOSIS — A084 Viral intestinal infection, unspecified: Secondary | ICD-10-CM

## 2014-01-16 DIAGNOSIS — R079 Chest pain, unspecified: Secondary | ICD-10-CM

## 2014-01-16 DIAGNOSIS — K219 Gastro-esophageal reflux disease without esophagitis: Secondary | ICD-10-CM

## 2014-01-16 NOTE — Progress Notes (Signed)
Pre visit review using our clinic review tool, if applicable. No additional management support is needed unless otherwise documented below in the visit note. 

## 2014-01-16 NOTE — Progress Notes (Signed)
   Dr. Frederico Hamman T. Braedyn Kauk, MD, [facility] [place], [address] Phone: [phone] Fax: [phone]  01/16/2014  Patient: Debbie Richardson, MRN: [id number], DOB: 1960-05-07, 53 y.o.  Primary Physician:  Loura Pardon, MD  Chief Complaint: Heartburn and Diarrhea  Subjective:   Debbie Richardson is a 53 y.o. very pleasant female patient generally healthy with FH CAD in her father who presents with the following:  Bad heartburn for a couple of days. Woke up in the middle of the night. Tuesday, had dinner and felt like the diarrhea and up most of the night with diarrhea. Copious watery diarrhea without blood or mucous.  Did not sleep well. Drank fluids yesterday. Diarrhea gone. Has been stable on protonix for a long time now.  Did take some Zantac.   F with CHF, 20% EF Grandparents.  No tobacco or drug use. No HTN, normal lipids per report, no dm  Past Medical History, Surgical History, Social History, Family History, Problem List, Medications, and Allergies have been reviewed and updated if relevant.   GEN: as above GI: as above Pulm: No SOB Interactive and getting along well at home.  Otherwise, ROS is as per the HPI.  Objective:   BP 120/78  Pulse 87  Temp(Src) 98.3 F (36.8 C) (Oral)  Ht 5' 2.5" (1.588 m)  Wt 128 lb 8 oz (58.287 kg)  BMI 23.11 kg/m2  LMP 03/30/2013  GEN: WDWN, NAD, Non-toxic, A & O x 3 HEENT: Atraumatic, Normocephalic. Neck supple. No masses, No LAD. Ears and Nose: No external deformity. CV: RRR, No M/G/R. No JVD. No thrill. No extra heart sounds. PULM: CTA B, no wheezes, crackles, rhonchi. No retractions. No resp. distress. No accessory muscle use. ABD: S, NT, ND, hyperactive BS, No rebound, No HSM  EXTR: No c/c/e NEURO Normal gait.  PSYCH: Normally interactive. Conversant. Not depressed or anxious appearing.  Calm demeanor.   Laboratory and Imaging Data:  Assessment and Plan:    Chest pain, unspecified chest pain type - Plan: EKG 12-Lead  Viral gastroenteritis  Gastroesophageal reflux disease, esophagitis presence not specified  Resolved GI illness with exacerbated GERD. Bland diet, add zantac bid for the next week to protonix.  I think all heartburn. Good normal exercise tolerance with FH as only risk factor. Classic GERD.  EKG: NSR, no ST elevation or depression. Disagree with computer. Regular QRS complex and regular p waves  Follow-up: No Follow-up on file.  New Prescriptions   No medications on file   Orders Placed This Encounter  Procedures  . EKG 12-Lead    Signed,  Atzel Mccambridge T. Kazi Reppond, MD   Patient's Medications  New Prescriptions   No medications on file  Previous Medications   ESTRADIOL-NORETHINDRONE (COMBIPATCH) 0.05-0.14 MG/DAY    Place 1 patch onto the skin 2 (two) times a week.   PANTOPRAZOLE (PROTONIX) 40 MG TABLET    TAKE ONE (1) TABLET BY MOUTH EVERY DAY   RANITIDINE (ZANTAC) 150 MG TABLET    Take 150 mg by mouth 2 (two) times daily.  Modified Medications   No medications on file  Discontinued Medications   CEPHALEXIN (KEFLEX) 500 MG CAPSULE    Take 1 capsule (500 mg total) by mouth 3 (three) times daily.

## 2014-03-04 ENCOUNTER — Encounter: Payer: Self-pay | Admitting: Family Medicine

## 2014-03-04 ENCOUNTER — Ambulatory Visit (INDEPENDENT_AMBULATORY_CARE_PROVIDER_SITE_OTHER): Payer: BC Managed Care – PPO | Admitting: Family Medicine

## 2014-03-04 VITALS — BP 116/78 | HR 87 | Temp 98.6°F | Ht 62.5 in | Wt 130.0 lb

## 2014-03-04 DIAGNOSIS — K219 Gastro-esophageal reflux disease without esophagitis: Secondary | ICD-10-CM

## 2014-03-04 DIAGNOSIS — R1030 Lower abdominal pain, unspecified: Secondary | ICD-10-CM

## 2014-03-04 DIAGNOSIS — R194 Change in bowel habit: Secondary | ICD-10-CM

## 2014-03-04 LAB — POCT URINALYSIS DIPSTICK
Bilirubin, UA: NEGATIVE
GLUCOSE UA: NEGATIVE
Ketones, UA: NEGATIVE
Nitrite, UA: NEGATIVE
Protein, UA: NEGATIVE
Spec Grav, UA: 1.01
Urobilinogen, UA: 0.2
pH, UA: 6

## 2014-03-04 NOTE — Patient Instructions (Signed)
Make sure to drink lots of fluids Try citrucel fiber supplement over the counter once daily - to see if it helps bowel habits  Also continue protonix  Avoid heavy and spicy foods and carbonation  Stop at check out for referral to GI

## 2014-03-04 NOTE — Assessment & Plan Note (Signed)
Symptoms have worsened lately -even with protonix  Stress plays a role as well  Also ? New anemia when checked at gyn Disc gerd diet  Ref to GI

## 2014-03-04 NOTE — Assessment & Plan Note (Signed)
Intermittent diarrhea and constipation with low abd bloating and pain  Also new anemia at gyn  Will ref to GI for further eval  She desired colon cancer screening as well  Disc trial of daily fiber to see if this helps in the meantime

## 2014-03-04 NOTE — Progress Notes (Signed)
Subjective:    Patient ID: Debbie Richardson, female    DOB: 11/13/1960, 53 y.o.   MRN: [id number]  HPI Here for abdominal discomfort   Has a lot of low abdomen pressure along with change in bowel habits  Constipation intermittently with diarrhea  occ rectal pressure and she thinks she needs to go and cannot    Over the summer had a lot of constipation  More heartburn with stress (work) in the fall -- even on the protonix (bad enough to cause nausea) Added zantac and that helped   Ate stew at a family event  Since then just does not feel great    Saw Dr Gaetano Net for her gyn annual exam last week - all was good gyn wise  No ovarian masses  Hb was low - no longer having menses   (was around 9)  She asked for ref for colonoscopy   Patient Active Problem List   Diagnosis Date Noted  . Change in bowel habits 03/04/2014  . Laceration of hand, right 08/19/2013  . Allergic conjunctivitis and rhinitis 07/04/2012  . GERD (gastroesophageal reflux disease) 05/01/2007  . PSORIASIS NEC 12/07/2006   Past Medical History  Diagnosis Date  . Dizziness     vertigo  . GERD (gastroesophageal reflux disease)   . Anxiety   . Gallstones    Past Surgical History  Procedure Laterality Date  . Cesarean section    . Cholecystectomy     History  Substance Use Topics  . Smoking status: Former Research scientist (life sciences)  . Smokeless tobacco: Never Used  . Alcohol Use: 0.0 oz/week    0 Not specified per week     Comment: 1 drink daily   Family History  Problem Relation Age of Onset  . Hypertension Mother   . Hyperlipidemia Mother   . GER disease Mother   . Heart disease Paternal Grandmother   . Heart disease Father   . Breast cancer Paternal Grandmother   . Kidney disease Neg Hx   . Liver disease Neg Hx   . Colon cancer Neg Hx   . Colon polyps Neg Hx   . Esophageal cancer Neg Hx   . Pancreatic cancer Neg Hx    Allergies  Allergen Reactions  . Ciprofloxacin    Current Outpatient Prescriptions  on File Prior to Visit  Medication Sig Dispense Refill  . estradiol-norethindrone (COMBIPATCH) 0.05-0.14 MG/DAY Place 1 patch onto the skin 2 (two) times a week.     No current facility-administered medications on file prior to visit.    Review of Systems Review of Systems  Constitutional: Negative for fever, appetite change, fatigue and unexpected weight change.  Eyes: Negative for pain and visual disturbance.  Respiratory: Negative for cough and shortness of breath.   Cardiovascular: Negative for cp or palpitations    Gastrointestinal: Negative for blood in stool/ black colored stool or vomiting  Genitourinary: Negative for urgency and frequency.  Skin: Negative for pallor or rash   Neurological: Negative for weakness, light-headedness, numbness and headaches.  Hematological: Negative for adenopathy. Does not bruise/bleed easily.  Psychiatric/Behavioral: Negative for dysphoric mood. The patient is not nervous/anxious.         Objective:   Physical Exam  Constitutional: She appears well-developed and well-nourished. No distress.  HENT:  Head: Normocephalic and atraumatic.  Mouth/Throat: Oropharynx is clear and moist.  Eyes: Conjunctivae and EOM are normal. Pupils are equal, round, and reactive to light. Right eye exhibits no discharge. Left eye exhibits no  discharge. No scleral icterus.  Neck: Normal range of motion. Neck supple. No JVD present. Carotid bruit is not present. No thyromegaly present.  Cardiovascular: Normal rate, regular rhythm and normal heart sounds.   Pulmonary/Chest: Effort normal and breath sounds normal. No respiratory distress. She has no wheezes. She exhibits no tenderness.  Abdominal: Soft. Bowel sounds are normal. She exhibits no distension and no mass. There is no hepatosplenomegaly. There is tenderness in the epigastric area and left upper quadrant. There is no rigidity, no rebound, no guarding, no tenderness at McBurney's point and negative Murphy's sign.    Musculoskeletal: She exhibits no edema.  Lymphadenopathy:    She has no cervical adenopathy.  Neurological: She is alert. She has normal reflexes. No cranial nerve deficit. She exhibits normal muscle tone. Coordination normal.  Skin: Skin is warm and dry. No rash noted. No pallor.  No jaundice   Psychiatric: She has a normal mood and affect.          Assessment & Plan:   Problem List Items Addressed This Visit      Digestive   GERD (gastroesophageal reflux disease)    Symptoms have worsened lately -even with protonix  Stress plays a role as well  Also ? New anemia when checked at gyn Disc gerd diet  Ref to GI      Relevant Orders      Ambulatory referral to Gastroenterology     Other   Change in bowel habits    Intermittent diarrhea and constipation with low abd bloating and pain  Also new anemia at gyn  Will ref to GI for further eval  She desired colon cancer screening as well  Disc trial of daily fiber to see if this helps in the meantime     Relevant Orders      Ambulatory referral to Gastroenterology    Other Visit Diagnoses    Lower abdominal pain    -  Primary    Relevant Orders       POCT urinalysis dipstick (Completed)       Ambulatory referral to Gastroenterology

## 2014-03-04 NOTE — Progress Notes (Signed)
Pre visit review using our clinic review tool, if applicable. No additional management support is needed unless otherwise documented below in the visit note. 

## 2014-03-06 ENCOUNTER — Other Ambulatory Visit (INDEPENDENT_AMBULATORY_CARE_PROVIDER_SITE_OTHER): Payer: BC Managed Care – PPO

## 2014-03-06 ENCOUNTER — Ambulatory Visit (INDEPENDENT_AMBULATORY_CARE_PROVIDER_SITE_OTHER): Payer: BC Managed Care – PPO | Admitting: Physician Assistant

## 2014-03-06 ENCOUNTER — Encounter: Payer: Self-pay | Admitting: Physician Assistant

## 2014-03-06 VITALS — BP 110/80 | HR 96 | Ht 62.5 in | Wt 130.2 lb

## 2014-03-06 DIAGNOSIS — K219 Gastro-esophageal reflux disease without esophagitis: Secondary | ICD-10-CM

## 2014-03-06 DIAGNOSIS — K5900 Constipation, unspecified: Secondary | ICD-10-CM

## 2014-03-06 DIAGNOSIS — K625 Hemorrhage of anus and rectum: Secondary | ICD-10-CM

## 2014-03-06 DIAGNOSIS — K648 Other hemorrhoids: Secondary | ICD-10-CM

## 2014-03-06 LAB — CBC WITH DIFFERENTIAL/PLATELET
BASOS ABS: 0 10*3/uL (ref 0.0–0.1)
BASOS PCT: 0.4 % (ref 0.0–3.0)
Eosinophils Absolute: 0.1 10*3/uL (ref 0.0–0.7)
Eosinophils Relative: 1 % (ref 0.0–5.0)
HCT: 41 % (ref 36.0–46.0)
Hemoglobin: 13.9 g/dL (ref 12.0–15.0)
LYMPHS PCT: 27.7 % (ref 12.0–46.0)
Lymphs Abs: 1.5 10*3/uL (ref 0.7–4.0)
MCHC: 33.9 g/dL (ref 30.0–36.0)
MCV: 93.3 fl (ref 78.0–100.0)
Monocytes Absolute: 0.4 10*3/uL (ref 0.1–1.0)
Monocytes Relative: 7.3 % (ref 3.0–12.0)
Neutro Abs: 3.5 10*3/uL (ref 1.4–7.7)
Neutrophils Relative %: 63.6 % (ref 43.0–77.0)
Platelets: 255 10*3/uL (ref 150.0–400.0)
RBC: 4.39 Mil/uL (ref 3.87–5.11)
RDW: 13.1 % (ref 11.5–15.5)
WBC: 5.4 10*3/uL (ref 4.0–10.5)

## 2014-03-06 LAB — IRON: IRON: 113 ug/dL (ref 42–145)

## 2014-03-06 LAB — FERRITIN: Ferritin: 52.8 ng/mL (ref 10.0–291.0)

## 2014-03-06 LAB — TSH: TSH: 1.58 u[IU]/mL (ref 0.35–4.50)

## 2014-03-06 MED ORDER — RANITIDINE HCL 150 MG PO TABS: ORAL_TABLET | ORAL | Status: DC

## 2014-03-06 MED ORDER — PANTOPRAZOLE SODIUM 40 MG PO TBEC: 40.0000 mg | DELAYED_RELEASE_TABLET | Freq: Every day | ORAL | Status: DC

## 2014-03-06 MED ORDER — HYDROCORTISONE ACETATE 25 MG RE SUPP: 25.0000 mg | Freq: Two times a day (BID) | RECTAL | Status: DC

## 2014-03-06 NOTE — Progress Notes (Signed)
Agree w/ Ms. Hvozdovic's note and mangement.  

## 2014-03-06 NOTE — Patient Instructions (Addendum)
You have been scheduled for an endoscopy and colonoscopy. Please follow the written instructions given to you at your visit today. Please pick up your prep at the pharmacy within the next 1-3 days. If you use inhalers (even only as needed), please bring them with you on the day of your procedure. Your physician has requested that you go to www.startemmi.com and enter the access code given to you at your visit today. This web site gives a general overview about your procedure. However, you should still follow specific instructions given to you by our office regarding your preparation for the procedure.  High-Fiber Diet Fiber is found in fruits, vegetables, and grains. A high-fiber diet encourages the addition of more whole grains, legumes, fruits, and vegetables in your diet. The recommended amount of fiber for adult males is 38 g per day. For adult females, it is 25 g per day. Pregnant and lactating women should get 28 g of fiber per day. If you have a digestive or bowel problem, ask your caregiver for advice before adding high-fiber foods to your diet. Eat a variety of high-fiber foods instead of only a select few type of foods.  PURPOSE  To increase stool bulk.  To make bowel movements more regular to prevent constipation.  To lower cholesterol.  To prevent overeating. WHEN IS THIS DIET USED?  It may be used if you have constipation and hemorrhoids.  It may be used if you have uncomplicated diverticulosis (intestine condition) and irritable bowel syndrome.  It may be used if you need help with weight management.  It may be used if you want to add it to your diet as a protective measure against atherosclerosis, diabetes, and cancer. SOURCES OF FIBER  Whole-grain breads and cereals.  Fruits, such as apples, oranges, bananas, berries, prunes, and pears.  Vegetables, such as green peas, carrots, sweet potatoes, beets, broccoli, cabbage, spinach, and artichokes.  Legumes, such split  peas, soy, lentils.  Almonds. FIBER CONTENT IN FOODS Starches and Grains / Dietary Fiber (g)  Cheerios, 1 cup / 3 g  Corn Flakes cereal, 1 cup / 0.7 g  Rice crispy treat cereal, 1 cup / 0.3 g  Instant oatmeal (cooked),  cup / 2 g  Frosted wheat cereal, 1 cup / 5.1 g  Brown, long-grain rice (cooked), 1 cup / 3.5 g  White, long-grain rice (cooked), 1 cup / 0.6 g  Enriched macaroni (cooked), 1 cup / 2.5 g Legumes / Dietary Fiber (g)  Baked beans (canned, plain, or vegetarian),  cup / 5.2 g  Kidney beans (canned),  cup / 6.8 g  Pinto beans (cooked),  cup / 5.5 g Breads and Crackers / Dietary Fiber (g)  Plain or honey graham crackers, 2 squares / 0.7 g  Saltine crackers, 3 squares / 0.3 g  Plain, salted pretzels, 10 pieces / 1.8 g  Whole-wheat bread, 1 slice / 1.9 g  White bread, 1 slice / 0.7 g  Raisin bread, 1 slice / 1.2 g  Plain bagel, 3 oz / 2 g  Flour tortilla, 1 oz / 0.9 g  Corn tortilla, 1 small / 1.5 g  Hamburger or hotdog bun, 1 small / 0.9 g Fruits / Dietary Fiber (g)  Apple with skin, 1 medium / 4.4 g  Sweetened applesauce,  cup / 1.5 g  Banana,  medium / 1.5 g  Grapes, 10 grapes / 0.4 g  Orange, 1 small / 2.3 g  Raisin, 1.5 oz / 1.6 g  Melon,  1 cup / 1.4 g Vegetables / Dietary Fiber (g)  Green beans (canned),  cup / 1.3 g  Carrots (cooked),  cup / 2.3 g  Broccoli (cooked),  cup / 2.8 g  Peas (cooked),  cup / 4.4 g  Mashed potatoes,  cup / 1.6 g  Lettuce, 1 cup / 0.5 g  Corn (canned),  cup / 1.6 g  Tomato,  cup / 1.1 g Document Released: 03/21/2005 Document Revised: 09/20/2011 Document Reviewed: 06/23/2011 ExitCare Patient Information 2015 [place], [place]. This information is not intended to replace advice given to you by your health care provider. Make sure you discuss any questions you have with your health care provider.  Use Benefiber 1 heaping tbsp every day with water intake Use Colace as needed to soften  stools  Food Choices for Gastroesophageal Reflux Disease When you have gastroesophageal reflux disease (GERD), the foods you eat and your eating habits are very important. Choosing the right foods can help ease the discomfort of GERD. WHAT GENERAL GUIDELINES DO I NEED TO FOLLOW?  Choose fruits, vegetables, whole grains, low-fat dairy products, and low-fat meat, fish, and poultry.  Limit fats such as oils, salad dressings, butter, nuts, and avocado.  Keep a food diary to identify foods that cause symptoms.  Avoid foods that cause reflux. These may be different for different people.  Eat frequent small meals instead of three large meals each day.  Eat your meals slowly, in a relaxed setting.  Limit fried foods.  Cook foods using methods other than frying.  Avoid drinking alcohol.  Avoid drinking large amounts of liquids with your meals.  Avoid bending over or lying down until 2-3 hours after eating. WHAT FOODS ARE NOT RECOMMENDED? The following are some foods and drinks that may worsen your symptoms: Vegetables Tomatoes. Tomato juice. Tomato and spaghetti sauce. Chili peppers. Onion and garlic. Horseradish. Fruits Oranges, grapefruit, and lemon (fruit and juice). Meats High-fat meats, fish, and poultry. This includes hot dogs, ribs, ham, sausage, salami, and bacon. Dairy Whole milk and chocolate milk. Sour cream. Cream. Butter. Ice cream. Cream cheese.  Beverages Coffee and tea, with or without caffeine. Carbonated beverages or energy drinks. Condiments Hot sauce. Barbecue sauce.  Sweets/Desserts Chocolate and cocoa. Donuts. Peppermint and spearmint. Fats and Oils High-fat foods, including [place] fries and potato chips. Other Vinegar. Strong spices, such as black pepper, white pepper, red pepper, cayenne, curry powder, cloves, ginger, and chili powder. The items listed above may not be a complete list of foods and beverages to avoid. Contact your dietitian for more  information. Document Released: 03/21/2005 Document Revised: 03/26/2013 Document Reviewed: 01/23/2013 [facility] Patient Information 2015 [place], [place]. This information is not intended to replace advice given to you by your health care provider. Make sure you discuss any questions you have with your health care provider.   We will send in your prescriptions to your pharmacy Protonix,Ranitidine,Anusol [facility] suppositories  Go to the basement for labs today

## 2014-03-06 NOTE — Progress Notes (Signed)
Patient ID: Debbie Richardson, female   DOB: Feb 14, 1961, 53 y.o.   MRN: [id number]    HPI:  [name] is a 53 year old female referred by Dr. Glori Bickers due to reflux and a change in bowel habits.  [name] is a 53 year old female who states she has been troubled with heartburn for 7 or 8 years. Initially she would use toms or Rolaids but these fail to provide any relief. Approximately 5 years ago she was started on Protonix and it worked very well. About a year ago her insurance changed her to generic pantoprazole and she began to experience breakthrough heartburn. Over the past year she has also been under a lot of stress as her husband and daughter have been ill and she's been getting a lot of heartburn she takes her pantoprazole first thing in the morning on an empty stomach but by mid morning has breakthrough heartburn. She has no nocturnal cough but she feels as if she is getting choked in her sleep she has rare nausea but she has had a couple nights several weeks ago where she woke up with nausea. She saw her PCP for this and was advised to use Zantac at bedtime which has helped. She discontinued this a couple weeks ago because she was feeling better. She continues to feel that her heartburn still is not under as good control as it had been when she was on brand name Protonix. She has no dysphasia or odynophagia her appetite as been good and her weight has been stable. She states she had an EGD 20-22 years ago in [place] at which time she was diagnosed with H. pylori and was treated.  She also states that her stools have always been normal and she would have a bowel movement on a daily basis. However, over the past 6 months she has become very constipated she passes hard nugget like stools and skips a day between bowel movements. She feels this is due to stress. She has had blood on the toilet tissue and has attributed this to her hemorrhoids. She has no rectal pain but she has been having rectal itching.  She gets very bloated after meals and this is relieved with defecation. There is no family history of colon cancer colon polyps or inflammatory bowel disease.   Past Medical History  Diagnosis Date  . Dizziness     vertigo  . GERD (gastroesophageal reflux disease)   . Anxiety   . Gallstones     Past Surgical History  Procedure Laterality Date  . Cesarean section    . Cholecystectomy     Family History  Problem Relation Age of Onset  . Hypertension Mother   . Hyperlipidemia Mother   . GER disease Mother   . Heart disease Paternal Grandmother   . Heart disease Father   . Breast cancer Paternal Grandmother   . Kidney disease Neg Hx   . Liver disease Neg Hx   . Colon cancer Neg Hx   . Colon polyps Neg Hx   . Esophageal cancer Neg Hx   . Pancreatic cancer Neg Hx    History  Substance Use Topics  . Smoking status: Former Research scientist (life sciences)  . Smokeless tobacco: Never Used  . Alcohol Use: 0.0 oz/week    0 Not specified per week     Comment: 1 drink daily   Current Outpatient Prescriptions  Medication Sig Dispense Refill  . estradiol-norethindrone (COMBIPATCH) 0.05-0.14 MG/DAY Place 1 patch onto the skin 2 (two) times a week.    Marland Kitchen  pantoprazole (PROTONIX) 40 MG tablet Take 1 tablet (40 mg total) by mouth daily. 30 minutes prior to breakfast 30 tablet 5  . hydrocortisone (ANUCORT-HC) 25 MG suppository Place 1 suppository (25 mg total) rectally 2 (two) times daily. 14 suppository 1  . ranitidine (ZANTAC) 150 MG tablet Take 300mg  by mouth daily at bedtime 60 tablet 3   No current facility-administered medications for this visit.   Allergies  Allergen Reactions  . Ciprofloxacin      Review of Systems: Gen: Denies any fever, chills, sweats, anorexia, fatigue, weakness, malaise, weight loss, and sleep disorder CV: Denies chest pain, angina, palpitations, syncope, orthopnea, PND, peripheral edema, and claudication. Resp: Denies dyspnea at rest, dyspnea with exercise, cough, sputum,  wheezing, coughing up blood, and pleurisy. GI: Denies vomiting blood, jaundice, and fecal incontinence.   Denies dysphagia or odynophagia. GU : Denies urinary burning, blood in urine, urinary frequency, urinary hesitancy, nocturnal urination, and urinary incontinence. MS: Denies joint pain, limitation of movement, and swelling, stiffness, low back pain, extremity pain. Denies muscle weakness, cramps, atrophy.  Derm: Denies rash, itching, dry skin, hives, moles, warts, or unhealing ulcers.  Psych: Denies depression, anxiety, memory loss, suicidal ideation, hallucinations, paranoia, and confusion. Heme: Denies bruising, bleeding, and enlarged lymph nodes. Neuro:  Denies any headaches, dizziness, paresthesias. Endo:  Denies any problems with DM, thyroid, adrenal function   Physical Exam: BP 110/80 mmHg  Pulse 96  Ht 5' 2.5" (1.588 m)  Wt 130 lb 3.2 oz (59.058 kg)  BMI 23.42 kg/m2  LMP 03/30/2013 Constitutional: Pleasant,well-developed, pleasant,female in no acute distress. HEENT: Normocephalic and atraumatic. Conjunctivae are normal. No scleral icterus. Neck supple.  Cardiovascular: Normal rate, regular rhythm.  Pulmonary/chest: Effort normal and breath sounds normal. No wheezing, rales or rhonchi. Abdominal: Soft, nondistended, nontender. Bowel sounds active throughout. There are no masses palpable. No hepatomegaly. Rectal: hard stool, hem negative, small hemorrhoids Extremities: no edema Lymphadenopathy: No cervical adenopathy noted. Neurological: Alert and oriented to person place and time. Skin: Skin is warm and dry. No rashes noted. Psychiatric: Normal mood and affect. Behavior is normal.  ASSESSMENT AND PLAN: #1 GERD. An antireflux regimen has been reviewed with the patient she has been advised to continue Protonix 40 mg by mouth every morning 30 minutes prior to breakfast and will use ranitidine 300 mg at at bedtime. She will be scheduled for an upper endoscopy to evaluate for  esophagitis, gastritis, ulcer etc.The risks, benefits, and alternatives to endoscopy with possible biopsy and possible dilation were discussed with the patient and they consent to proceed.   #2. Constipation, rectal bleeding, hemorrhoids. Her hemorrhoids and rectal bleeding are likely due to the constipation. She has been advised to adhere to a high-fiber diet and to increase fluid in her diet she will try Benefiber a heaping tablespoon daily she will use Colace as needed to soften her stools for her hemorrhoids she will be given a trial of Anusol HC suppositories 1 per rectum twice daily for 7 days. She will be scheduled for a colonoscopy to evaluate for polyps, neoplasia, or possible inflammatory bowel disease.The risks, benefits, and alternatives to colonoscopy with possible biopsy and possible polypectomy were discussed with the patient and they consent to proceed. The procedures will be scheduled with Dr. Carlean Purl.  Further recommendations will be made pending the findings of her endoscopy and colonoscopy.    Pilar Westergaard, Vita Barley PA-C 03/06/2014, 11:37 AM

## 2014-03-21 ENCOUNTER — Encounter: Payer: Self-pay | Admitting: Internal Medicine

## 2014-04-04 DIAGNOSIS — C569 Malignant neoplasm of unspecified ovary: Secondary | ICD-10-CM

## 2014-04-04 HISTORY — DX: Malignant neoplasm of unspecified ovary: C56.9

## 2014-04-15 ENCOUNTER — Encounter: Payer: BC Managed Care – PPO | Admitting: Internal Medicine

## 2014-04-16 ENCOUNTER — Ambulatory Visit (AMBULATORY_SURGERY_CENTER): Payer: BLUE CROSS/BLUE SHIELD | Admitting: Internal Medicine

## 2014-04-16 ENCOUNTER — Encounter: Payer: Self-pay | Admitting: Internal Medicine

## 2014-04-16 VITALS — BP 108/79 | HR 85 | Temp 99.2°F | Resp 18 | Ht 62.5 in | Wt 130.0 lb

## 2014-04-16 DIAGNOSIS — R194 Change in bowel habit: Secondary | ICD-10-CM

## 2014-04-16 DIAGNOSIS — K649 Unspecified hemorrhoids: Secondary | ICD-10-CM

## 2014-04-16 DIAGNOSIS — K625 Hemorrhage of anus and rectum: Secondary | ICD-10-CM

## 2014-04-16 DIAGNOSIS — K317 Polyp of stomach and duodenum: Secondary | ICD-10-CM

## 2014-04-16 DIAGNOSIS — K219 Gastro-esophageal reflux disease without esophagitis: Secondary | ICD-10-CM

## 2014-04-16 DIAGNOSIS — K5730 Diverticulosis of large intestine without perforation or abscess without bleeding: Secondary | ICD-10-CM

## 2014-04-16 MED ORDER — SODIUM CHLORIDE 0.9 % IV SOLN: 500.0000 mL | INTRAVENOUS | Status: DC

## 2014-04-16 MED ORDER — OMEPRAZOLE 40 MG PO CPDR: 40.0000 mg | DELAYED_RELEASE_CAPSULE | Freq: Every day | ORAL | Status: DC

## 2014-04-16 NOTE — Op Note (Signed)
[place]  [address] [place], [id number]   ENDOSCOPY PROCEDURE REPORT  PATIENT: Xin, Klawitter  MR#: [id number] BIRTHDATE: 1960-07-03 , 53  yrs. old GENDER: female ENDOSCOPIST: Gatha Mayer, MD, [facility] PROCEDURE DATE:  04/16/2014 PROCEDURE:  EGD w/ biopsy ASA CLASS:     Class II INDICATIONS:  reflux despite PPI and H2 Blocker. MEDICATIONS: Propofol 150 mg IV and Monitored anesthesia care TOPICAL ANESTHETIC: none  DESCRIPTION OF PROCEDURE: After the risks benefits and alternatives of the procedure were thoroughly explained, informed consent was obtained.  The LB [id number] endoscope was introduced through the mouth and advanced to the second portion of the duodenum , Without limitations.  The instrument was slowly withdrawn as the mucosa was fully examined.    1) a few diminutive gastric polyps - biopsied 2) Otherwise normal esophagus, stomach and duodenum.  Retroflexed views revealed no abnormalities.     The scope was then withdrawn from the patient and the procedure completed.  COMPLICATIONS: There were no immediate complications.  ENDOSCOPIC IMPRESSION: 1) a ew diminutive gastric polyps - biopsied - do not think these are a problem 2) Otherwise normal esophagus, stomach and duodenum  RECOMMENDATIONS: 1.  Change pantoprazole to omeprazole 40 mg daily may need sucralfate await pathology 2.  Strict reflux prevention diet   eSigned:  Gatha Mayer, MD, [facility] 04/16/2014 3:36 PM    CC:The Patient and Loura Pardon, MD

## 2014-04-16 NOTE — Progress Notes (Signed)
Called to room to assist during endoscopic procedure.  Patient ID and intended procedure confirmed with present staff. Received instructions for my participation in the procedure from the performing physician.  

## 2014-04-16 NOTE — Progress Notes (Signed)
  [facility] Post-op Note  Patient: Debbie Richardson  Procedure(s) Performed: colonoscopy and endoscopy  Patient Location: [facility] - Recovery Area  Anesthesia Type: Deep Sedation/Propofol  Level of Consciousness: awake, oriented and patient cooperative  Airway and Oxygen Therapy: Patient Spontanous Breathing  Post-op Pain: none  Post-op Assessment:  Post-op Vital signs reviewed, Patient's Cardiovascular Status Stable, Respiratory Function Stable, Patent Airway, No signs of Nausea or vomiting and Pain level controlled  Post-op Vital Signs: Reviewed and stable  Complications: No apparent anesthesia complications  Madi Bonfiglio E 3:29 PM

## 2014-04-16 NOTE — Op Note (Signed)
[place]  [address]. [place], [address]   COLONOSCOPY PROCEDURE REPORT  PATIENT: Debbie, Richardson  MR#: [id number] BIRTHDATE: [birth year] , 40  yrs. old GENDER: female ENDOSCOPIST: Gatha Mayer, MD, [facility] PROCEDURE DATE:  04/16/2014 PROCEDURE:   Colonoscopy, diagnostic First Screening Colonoscopy - Avg.  risk and is 50 yrs.  old or older - No.  Prior Negative Screening - Now for repeat screening. N/A  History of Adenoma - Now for follow-up colonoscopy & has been > or = to 3 yrs.  N/A  Polyps Removed Today? No.  Polyps Removed Today? No.  Recommend repeat exam, <10 yrs? Polyps Removed Today? No.  Recommend repeat exam, <10 yrs? No. ASA CLASS:   Class II INDICATIONS:change in bowel habits and rectal bleeding. MEDICATIONS: Residual sedation present, Monitored anesthesia care, and Propofol 250 mg IV  DESCRIPTION OF PROCEDURE:   After the risks benefits and alternatives of the procedure were thoroughly explained, informed consent was obtained.  The digital rectal exam revealed no abnormalities of the rectum.   The LB [id number]  endoscope was introduced through the anus and advanced to the cecum, which was identified by both the appendix and ileocecal valve. No adverse events experienced.   The quality of the prep was excellent, using MiraLax  The instrument was then slowly withdrawn as the colon was fully examined.   COLON FINDINGS: 1) Mild-moderate sigmoid diverticulosis 2) small external hemorrhoids 3) Otherwise normal colonoscopy.  Retroflexed views revealed no abnormalities. The time to cecum=3 minutes 03 seconds.  Withdrawal time=7 minutes 07 seconds.  The scope was withdrawn and the procedure completed. COMPLICATIONS: There were no immediate complications.  ENDOSCOPIC IMPRESSION: 1) Mild-moderate sigmoid diverticulosis 2) small external hemorrhoids 3) Otherwise normal colonoscopy  RECOMMENDATIONS: Benefiber 2 tbsp daily Anusol HC  prn repeat colonoscopy 10 years  eSigned:  Gatha Mayer, MD, [facility] [address] 04/16/2014 3:41 PM   cc: The Patient and Loura Pardon, MD

## 2014-04-16 NOTE — Patient Instructions (Addendum)
There were some small polyps in the stomach - I took biopsies. They are not usually a problem and do not cause symptoms. I will let you know about biopsy results.  The colonoscopy showed diverticulosis and small hemorrhoids - no polyps or cancer. Please take Benefiber 2 tablespoons daily to regulate bowels and reduce hemorrhoid problems.  Next routine colonoscopy in 10 years - 2026  I appreciate the opportunity to care for you. Gatha Mayer, MD, FACG     YOU HAD AN ENDOSCOPIC PROCEDURE TODAY AT [facility]: Refer to the procedure report that was given to you for any specific questions about what was found during the examination.  If the procedure report does not answer your questions, please call your gastroenterologist to clarify.  If you requested that your care partner not be given the details of your procedure findings, then the procedure report has been included in a sealed envelope for you to review at your convenience later.  YOU SHOULD EXPECT: Some feelings of bloating in the abdomen. Passage of more gas than usual.  Walking can help get rid of the air that was put into your GI tract during the procedure and reduce the bloating. If you had a lower endoscopy (such as a colonoscopy or flexible sigmoidoscopy) you may notice spotting of blood in your stool or on the toilet paper. If you underwent a bowel prep for your procedure, then you may not have a normal bowel movement for a few days.  DIET: Your first meal following the procedure should be a light meal and then it is ok to progress to your normal diet.  A half-sandwich or bowl of soup is an example of a good first meal.  Heavy or fried foods are harder to digest and may make you feel nauseous or bloated.  Likewise meals heavy in dairy and vegetables can cause extra gas to form and this can also increase the bloating.  Drink plenty of fluids but you should avoid alcoholic beverages for 24 hours.  ACTIVITY:  Your care partner should take you home directly after the procedure.  You should plan to take it easy, moving slowly for the rest of the day.  You can resume normal activity the day after the procedure however you should NOT DRIVE or use heavy machinery for 24 hours (because of the sedation medicines used during the test).    SYMPTOMS TO REPORT IMMEDIATELY: A gastroenterologist can be reached at any hour.  During normal business hours, 8:30 AM to 5:00 PM Monday through Friday, call [phone].  After hours and on weekends, please call the GI answering service at [phone] who will take a message and have the physician on call contact you.   Following lower endoscopy (colonoscopy or flexible sigmoidoscopy):  Excessive amounts of blood in the stool  Significant tenderness or worsening of abdominal pains  Swelling of the abdomen that is new, acute  Fever of 100F or higher  Following upper endoscopy (EGD)  Vomiting of blood or coffee ground material  New chest pain or pain under the shoulder blades  Painful or persistently difficult swallowing  New shortness of breath  Fever of 100F or higher  Black, tarry-looking stools  FOLLOW UP: If any biopsies were taken you will be contacted by phone or by letter within the next 1-3 weeks.  Call your gastroenterologist if you have not heard about the biopsies in 3 weeks.  Our staff will call the home number listed  on your records the next business day following your procedure to check on you and address any questions or concerns that you may have at that time regarding the information given to you following your procedure. This is a courtesy call and so if there is no answer at the home number and we have not heard from you through the emergency physician on call, we will assume that you have returned to your regular daily activities without incident.  SIGNATURES/CONFIDENTIALITY: You and/or your care partner have signed paperwork which will be  entered into your electronic medical record.  These signatures attest to the fact that that the information above on your After Visit Summary has been reviewed and is understood.  Full responsibility of the confidentiality of this discharge information lies with you and/or your care-partner.   Resume medications. Information given on GERD, Diverticulosis and Hemorrhoids with discharge instructions.

## 2014-04-17 ENCOUNTER — Telehealth: Payer: Self-pay | Admitting: *Deleted

## 2014-04-17 NOTE — Telephone Encounter (Signed)
  Follow up Call-  Call back number 04/16/2014  Post procedure Call Back phone  # [phone]  Permission to leave phone message Yes     Patient questions:  Do you have a fever, pain , or abdominal swelling? No. Pain Score  0 *  Have you tolerated food without any problems? Yes.    Have you been able to return to your normal activities? Yes.    Do you have any questions about your discharge instructions: Diet   No. Medications  No. Follow up visit  No.  Do you have questions or concerns about your Care? Yes.    Actions: * If pain score is 4 or above: No action needed, pain <4. Patient stating she has had a lot of friends tel her to check into whether she is gluten intolerant. Requested if [facility] offers dietary classes. Informed her to call main [facility] # and ask for Dietary services. Encouraged patient to follow through with Dr. Celesta Aver recommendations. Patient agreed.

## 2014-04-23 ENCOUNTER — Encounter: Payer: Self-pay | Admitting: Internal Medicine

## 2014-04-24 ENCOUNTER — Encounter: Payer: Self-pay | Admitting: Internal Medicine

## 2014-04-24 NOTE — Progress Notes (Signed)
Quick Note:  Fundic gland polyps No f/u needed ______

## 2014-04-26 LAB — CBC WITH DIFFERENTIAL/PLATELET
Basophil #: 0 10*3/uL (ref 0.0–0.1)
Basophil %: 0.3 %
EOS PCT: 1 %
Eosinophil #: 0.1 10*3/uL (ref 0.0–0.7)
HCT: 41.2 % (ref 35.0–47.0)
HGB: 14 g/dL (ref 12.0–16.0)
LYMPHS ABS: 1.4 10*3/uL (ref 1.0–3.6)
Lymphocyte %: 15.6 %
MCH: 32 pg (ref 26.0–34.0)
MCHC: 34 g/dL (ref 32.0–36.0)
MCV: 94 fL (ref 80–100)
Monocyte #: 0.5 x10 3/mm (ref 0.2–0.9)
Monocyte %: 5.4 %
NEUTROS ABS: 7.1 10*3/uL — AB (ref 1.4–6.5)
Neutrophil %: 77.7 %
Platelet: 237 10*3/uL (ref 150–440)
RBC: 4.37 10*6/uL (ref 3.80–5.20)
RDW: 12.8 % (ref 11.5–14.5)
WBC: 9.1 10*3/uL (ref 3.6–11.0)

## 2014-04-26 LAB — URINALYSIS, COMPLETE
BACTERIA: NONE SEEN
Bilirubin,UR: NEGATIVE
Blood: NEGATIVE
Glucose,UR: NEGATIVE mg/dL (ref 0–75)
Ketone: NEGATIVE
Leukocyte Esterase: NEGATIVE
NITRITE: NEGATIVE
Ph: 5 (ref 4.5–8.0)
Protein: NEGATIVE
Specific Gravity: 1.024 (ref 1.003–1.030)
Squamous Epithelial: <1

## 2014-04-26 LAB — COMPREHENSIVE METABOLIC PANEL
ALK PHOS: 47 U/L
ALT: 24 U/L
Albumin: 4.3 g/dL (ref 3.4–5.0)
Anion Gap: 8 (ref 7–16)
BILIRUBIN TOTAL: 0.5 mg/dL (ref 0.2–1.0)
BUN: 13 mg/dL (ref 7–18)
CO2: 26 mmol/L (ref 21–32)
Calcium, Total: 8.6 mg/dL (ref 8.5–10.1)
Chloride: 106 mmol/L (ref 98–107)
Creatinine: 0.8 mg/dL (ref 0.60–1.30)
EGFR (African American): >60
EGFR (Non-African Amer.): >60
Glucose: 97 mg/dL (ref 65–99)
Osmolality: 279 (ref 275–301)
Potassium: 4.1 mmol/L (ref 3.5–5.1)
SGOT(AST): 25 U/L (ref 15–37)
Sodium: 140 mmol/L (ref 136–145)
Total Protein: 7.6 g/dL (ref 6.4–8.2)

## 2014-04-26 LAB — LIPASE, BLOOD: Lipase: 192 U/L (ref 73–393)

## 2014-04-27 ENCOUNTER — Observation Stay: Payer: Self-pay | Admitting: Obstetrics and Gynecology

## 2014-04-28 LAB — CA 125: CA 125: 45.5 U/mL — ABNORMAL HIGH (ref 0.0–34.0)

## 2014-05-07 ENCOUNTER — Ambulatory Visit: Payer: Self-pay | Admitting: Obstetrics and Gynecology

## 2014-05-19 ENCOUNTER — Ambulatory Visit: Payer: Self-pay | Admitting: Vascular Surgery

## 2014-06-03 ENCOUNTER — Ambulatory Visit
Admit: 2014-06-03 | Disposition: A | Discharge status: Home / Self Care | Payer: Self-pay | Attending: Oncology | Admitting: Oncology

## 2014-06-03 ENCOUNTER — Ambulatory Visit
Admit: 2014-06-03 | Disposition: A | Discharge status: Home / Self Care | Payer: Self-pay | Attending: Obstetrics and Gynecology | Admitting: Obstetrics and Gynecology

## 2014-06-30 LAB — CBC CANCER CENTER
BASOS ABS: 0.2 x10 3/mm — AB (ref 0.0–0.1)
BASOS PCT: 1.2 %
EOS ABS: 0 x10 3/mm (ref 0.0–0.7)
EOS PCT: 0.3 %
HCT: 34.2 % — ABNORMAL LOW (ref 35.0–47.0)
HGB: 11.9 g/dL — ABNORMAL LOW (ref 12.0–16.0)
LYMPHS ABS: 2.5 x10 3/mm (ref 1.0–3.6)
Lymphocyte %: 16.5 %
MCH: 31.6 pg (ref 26.0–34.0)
MCHC: 34.7 g/dL (ref 32.0–36.0)
MCV: 91 fL (ref 80–100)
MONO ABS: 1.4 x10 3/mm — AB (ref 0.2–0.9)
Monocyte %: 9.4 %
NEUTROS ABS: 10.9 x10 3/mm — AB (ref 1.4–6.5)
Neutrophil %: 72.6 %
PLATELETS: 193 x10 3/mm (ref 150–440)
RBC: 3.76 10*6/uL — ABNORMAL LOW (ref 3.80–5.20)
RDW: 14.6 % — ABNORMAL HIGH (ref 11.5–14.5)
WBC: 15.1 x10 3/mm — ABNORMAL HIGH (ref 3.6–11.0)

## 2014-07-04 ENCOUNTER — Ambulatory Visit
Admit: 2014-07-04 | Disposition: A | Discharge status: Home / Self Care | Payer: Self-pay | Attending: Oncology | Admitting: Oncology

## 2014-07-04 ENCOUNTER — Ambulatory Visit
Admit: 2014-07-04 | Disposition: A | Discharge status: Home / Self Care | Payer: Self-pay | Attending: Obstetrics and Gynecology | Admitting: Obstetrics and Gynecology

## 2014-07-05 LAB — CREATININE, SERUM: Creatine, Serum: 0.73

## 2014-07-07 LAB — CBC CANCER CENTER
Basophil #: 0 x10 3/mm (ref 0.0–0.1)
Basophil %: 0.3 %
EOS ABS: 0 x10 3/mm (ref 0.0–0.7)
Eosinophil %: 0 %
HCT: 35.1 % (ref 35.0–47.0)
HGB: 12 g/dL (ref 12.0–16.0)
LYMPHS ABS: 1 x10 3/mm (ref 1.0–3.6)
Lymphocyte %: 10.4 %
MCH: 32.1 pg (ref 26.0–34.0)
MCHC: 34.3 g/dL (ref 32.0–36.0)
MCV: 94 fL (ref 80–100)
MONO ABS: 0.3 x10 3/mm (ref 0.2–0.9)
Monocyte %: 3 %
Neutrophil #: 8 x10 3/mm — ABNORMAL HIGH (ref 1.4–6.5)
Neutrophil %: 86.3 %
PLATELETS: 135 x10 3/mm — AB (ref 150–440)
RBC: 3.75 10*6/uL — ABNORMAL LOW (ref 3.80–5.20)
RDW: 15.7 % — AB (ref 11.5–14.5)
WBC: 9.2 x10 3/mm (ref 3.6–11.0)

## 2014-07-14 LAB — COMPREHENSIVE METABOLIC PANEL
Albumin: 4.4 g/dL
Alkaline Phosphatase: 45 U/L
Anion Gap: 8 (ref 7–16)
BUN: 10 mg/dL
Bilirubin,Total: 0.7 mg/dL
CALCIUM: 9.1 mg/dL
Chloride: 106 mmol/L
Co2: 21 mmol/L — ABNORMAL LOW
Creatinine: 0.6 mg/dL
GLUCOSE: 138 mg/dL — AB
Potassium: 2.9 mmol/L — ABNORMAL LOW
SGOT(AST): 24 U/L
SGPT (ALT): 20 U/L
Sodium: 135 mmol/L
Total Protein: 7.1 g/dL

## 2014-07-14 LAB — CBC CANCER CENTER
BASOS ABS: 0 x10 3/mm (ref 0.0–0.1)
Basophil %: 0.1 %
EOS ABS: 0 x10 3/mm (ref 0.0–0.7)
Eosinophil %: 0 %
HCT: 35.5 % (ref 35.0–47.0)
HGB: 12.2 g/dL (ref 12.0–16.0)
Lymphocyte #: 1.1 x10 3/mm (ref 1.0–3.6)
Lymphocyte %: 11 %
MCH: 32.4 pg (ref 26.0–34.0)
MCHC: 34.4 g/dL (ref 32.0–36.0)
MCV: 94 fL (ref 80–100)
Monocyte #: 0.7 x10 3/mm (ref 0.2–0.9)
Monocyte %: 6.8 %
NEUTROS ABS: 8.1 x10 3/mm — AB (ref 1.4–6.5)
NEUTROS PCT: 82.1 %
PLATELETS: 271 x10 3/mm (ref 150–440)
RBC: 3.77 10*6/uL — ABNORMAL LOW (ref 3.80–5.20)
RDW: 16.6 % — ABNORMAL HIGH (ref 11.5–14.5)
WBC: 9.8 x10 3/mm (ref 3.6–11.0)

## 2014-07-14 LAB — CREATININE, SERUM: Creatine, Serum: 0.6

## 2014-07-15 LAB — CA 125: CA 125: 16.5 U/mL (ref 0.0–34.0)

## 2014-07-21 LAB — MAGNESIUM: Magnesium: 1.8 mg/dL

## 2014-07-21 LAB — POTASSIUM: Potassium: 3.2 mmol/L — ABNORMAL LOW

## 2014-07-22 LAB — CBC CANCER CENTER
Basophil #: 0.1 x10 3/mm (ref 0.0–0.1)
Basophil %: 0.7 %
EOS ABS: 0 x10 3/mm (ref 0.0–0.7)
Eosinophil %: 0.3 %
HCT: 32.8 % — AB (ref 35.0–47.0)
HGB: 11.3 g/dL — ABNORMAL LOW (ref 12.0–16.0)
LYMPHS ABS: 1.6 x10 3/mm (ref 1.0–3.6)
Lymphocyte %: 18.4 %
MCH: 32.6 pg (ref 26.0–34.0)
MCHC: 34.5 g/dL (ref 32.0–36.0)
MCV: 95 fL (ref 80–100)
Monocyte #: 0.6 x10 3/mm (ref 0.2–0.9)
Monocyte %: 6.3 %
Neutrophil #: 6.6 x10 3/mm — ABNORMAL HIGH (ref 1.4–6.5)
Neutrophil %: 74.3 %
PLATELETS: 138 x10 3/mm — AB (ref 150–440)
RBC: 3.47 10*6/uL — ABNORMAL LOW (ref 3.80–5.20)
RDW: 15.8 % — AB (ref 11.5–14.5)
WBC: 8.9 x10 3/mm (ref 3.6–11.0)

## 2014-07-23 ENCOUNTER — Other Ambulatory Visit: Payer: Self-pay | Admitting: Family Medicine

## 2014-07-23 DIAGNOSIS — C569 Malignant neoplasm of unspecified ovary: Secondary | ICD-10-CM

## 2014-07-28 ENCOUNTER — Other Ambulatory Visit: Payer: Self-pay | Admitting: Oncology

## 2014-07-28 LAB — CBC CANCER CENTER
Basophil #: 0 x10 3/mm (ref 0.0–0.1)
Basophil %: 0.4 %
EOS ABS: 0 x10 3/mm (ref 0.0–0.7)
Eosinophil %: 0.1 %
HCT: 36.2 % (ref 35.0–47.0)
HGB: 12.3 g/dL (ref 12.0–16.0)
LYMPHS PCT: 24.8 %
Lymphocyte #: 1.7 x10 3/mm (ref 1.0–3.6)
MCH: 32.9 pg (ref 26.0–34.0)
MCHC: 34.1 g/dL (ref 32.0–36.0)
MCV: 97 fL (ref 80–100)
MONOS PCT: 4.8 %
Monocyte #: 0.3 x10 3/mm (ref 0.2–0.9)
NEUTROS ABS: 4.8 x10 3/mm (ref 1.4–6.5)
NEUTROS PCT: 69.9 %
Platelet: 150 x10 3/mm (ref 150–440)
RBC: 3.75 10*6/uL — AB (ref 3.80–5.20)
RDW: 16.9 % — ABNORMAL HIGH (ref 11.5–14.5)
WBC: 6.9 x10 3/mm (ref 3.6–11.0)

## 2014-07-28 LAB — SURGICAL PATHOLOGY

## 2014-07-29 ENCOUNTER — Other Ambulatory Visit: Payer: Self-pay | Admitting: Family Medicine

## 2014-07-29 DIAGNOSIS — C569 Malignant neoplasm of unspecified ovary: Secondary | ICD-10-CM

## 2014-07-30 ENCOUNTER — Other Ambulatory Visit: Payer: Self-pay

## 2014-07-30 ENCOUNTER — Other Ambulatory Visit: Payer: Self-pay | Admitting: Family Medicine

## 2014-07-30 DIAGNOSIS — C569 Malignant neoplasm of unspecified ovary: Secondary | ICD-10-CM

## 2014-08-03 NOTE — Op Note (Signed)
PATIENT NAME:  NAVADA, OSTERHOUT MR#:  [id number] DATE OF BIRTH:  [birth year]  DATE OF PROCEDURE:  05/19/2014  PREOPERATIVE DIAGNOSIS:  Ovarian cancer with poor venous access.   POSTOPERATIVE DIAGNOSIS:  Ovarian cancer with poor venous access.   PROCEDURES:  1.  Ultrasound guidance for vascular access, right internal jugular vein.  2.  Fluoroscopic guidance for placement of catheter.  3.  Placement of CT compatible Port-A-Cath, right internal jugular vein.   SURGEON:  Leotis Pain, MD  ANESTHESIA:  Local with moderate conscious sedation.   FLUOROSCOPY TIME:  Less than 1 minute.   CONTRAST:  Zero.   ESTIMATED BLOOD LOSS: 25 mL.  INDICATION FOR PROCEDURE: A 54 year old female with ovarian cancer who needs a Port-A-Cath for chemotherapy and durable venous access. We are asked to place this by the oncology service. Risks and benefits were discussed. Informed consent was obtained.   DESCRIPTION OF THE PROCEDURE:  The patient was brought to the vascular and interventional radiology suite. The right neck and chest were sterilely prepped and draped, and a sterile surgical field was created. Ultrasound was used to help visualize a patent right internal jugular vein. This was then accessed under direct ultrasound guidance without difficulty with a Seldinger needle and a permanent image was recorded. A J-wire was placed. After skin nick and dilatation, the peel-away sheath was then placed over the wire. I then anesthetized an area under the clavicle approximately 2 fingerbreadths. A transverse incision was created and an inferior pocket was created with electrocautery and blunt dissection. The port was then brought onto the field, placed into the pocket and secured to the chest wall with 2 Prolene sutures. The catheter was connected to the port and tunneled from the subclavicular incision to the access site. Fluoroscopic guidance was used to cut the catheter to an appropriate length. The catheter was then  placed through the peel-away sheath and the peel-away sheath was removed. The catheter tip was parked in excellent location in the cavoatrial junction. The pocket was then irrigated with antibiotic-impregnated saline and the wound was closed with a running 3-0 Vicryl and a 4-0 Monocryl. The access incision was closed with a single 4-0 Monocryl. The Huber needle was used to withdraw blood and flush the port with heparinized saline. Dermabond was then placed as a dressing. The patient tolerated the procedure well and was taken to the recovery room in stable condition.   ____________________________ Algernon Huxley, MD jsd:sb D: 05/19/2014 16:32:13 ET T: 05/19/2014 16:58:29 ET JOB#: [id number]  cc: Algernon Huxley, MD, <Dictator> Algernon Huxley MD ELECTRONICALLY SIGNED 05/28/2014 16:37

## 2014-08-03 NOTE — Op Note (Signed)
PATIENT NAME:  Debbie Richardson, Debbie Richardson MR#:  [id number] DATE OF BIRTH:  22-Jan-1961  DATE OF PROCEDURE:  04/27/2014  PREOPERATIVE DIAGNOSES:  1.  Acute left lower quadrant abdominal pain.  2.  Bilateral complex ovarian cyst.  3.  Concern for left ovarian torsion.   POSTOPERATIVE DIAGNOSES: 1.  Acute left lower quadrant abdominal pain.  2.  Bilateral complex ovarian cyst.  3.  Concern for left ovarian torsion.   PROCEDURE PERFORMED:  1.  Operative laparoscopy.  2.  Bilateral ovarian cystectomy.  3.  Bilateral salpingo-oophorectomy.  4.  Cystoscopy.   ANESTHESIA: General.   SURGEON: Will Bonnet, MD   ASSISTANT SURGEON: Colleen L. Danise Mina, CNM  ESTIMATED BLOOD LOSS: 75 mL.   OPERATIVE FLUIDS: 1700 mL.   COMPLICATIONS: None.   FINDINGS:  1.  Normal-appearing liver capsule.  2.  Normal-appearing appendix.  3.  Large complex right and left ovarian cysts.  4.  Evidence of torsion of left ovary.  5.  Bilateral efflux of ureters on cystoscopy.   SPECIMENS:   1.  Pelvic washings.  2.  Right ovary and fallopian tube.  3.  Left ovary and fallopian tube.   CONDITION AT END OF THE PROCEDURE: Stable.   PROCEDURE IN DETAIL: The patient was taken to the Operating Room where general anesthesia was administered and found to be adequate. The patient was placed in the dorsal supine lithotomy position in [place] stirrups and prepped and draped in the usual sterile fashion. After a timeout was called, an indwelling catheter was placed in her bladder. A sterile speculum was placed in the vagina and a single-tooth tenaculum was affixed to the anterior lip of the cervix. An acorn uterine manipulator was affixed to the tenaculum.   Attention was turned to the abdomen, where after injection of 0.5% Sensorcaine for local anesthetic, a 5 mm incision was made in the umbilicus. Laparoscopic entrance into the abdomen was gained via direct visualization with the camera through the trocar. Verification of  entry into the abdomen was obtained through opening pressures. After verification, the abdomen was insufflated with CO2. The camera was reintroduced through the trocar and atraumatic entry was verified. A left lower quadrant 11 mm port was placed under direct intra-abdominal camera visualization without difficulty. A 5 mm suprapubic trocar was placed under direct intra-abdominal camera visualization without difficulty. The abdomen and pelvis were surveyed with the above-noted findings. Pelvic washing were obtained at this point.  The right ovary was identified and the IP ligament was identified. The right ureter was identified and found to be well away from the operative area of interest. The IP ligament was cauterized using bipolar electrocautery and transected using the Harmonic scalpel. The rest of the ovary and fallopian tube pedicle was taken using the Harmonic scalpel from lateral to medial taking the fallopian tube as proximal to the uterus as possible. After liberation of the right ovary, the Endo Catch bag was introduced through the left lower quadrant port and the ovary and fallopian tube were placed into the Endo Catch bag. Of note, the ovary did rupture and contents of the cyst were spilled into the pelvis. This was due to some adherence of the right ovary to the posterior leaf of the broad ligament.  Rupture occurred during careful dissection of the ovary, as this was near the course of the ureter.  The ovary was removed through the left lower quadrant port. The pedicle on the right adnexa where the ovary and fallopian tube were removed was  inspected and the ureter was identified and found to be difficult to visualize, but did not appear to be transected.   Attention was turned to the left ureter which was unable to be visualized; however, the IP ligament was visualized and no ureter was found to be close to the IP ligament. In a similar fashion, the left IP ligament was cauterized and transected, and  the entire left ovary and cyst and fallopian tube were removed in a lateral to medial fashion without difficulty. The ovary had to be de-torsed prior to removal.  The Endo Catch bag was placed in left lower quadrant and the fallopian tube and ovary were placed in the Endo Catch bag and the left ovary and fallopian tube were removed without spilling any ovarian contents. At this point, hemostasis was noted at both pedicles.   Cystoscopy was undertaken with noted bilateral efflux of urine from the ureteral orifices. Also noted was no damage to the bladder. The bladder was drained using a red rubber catheter at this point. The acorn uterine manipulator and single-tooth tenaculum were also removed. Hemostasis was noted on the cervix. The vagina was free of any instrumentation at this point.   The abdomen was desufflated of CO2 and all trocars removed without difficulty. The port sites were closed using a subcutaneous 4-0 Vicryl stitch and the skin was reapproximated using Dermabond. A total of 17 mL of 0.5% Sensorcaine plain was used for local anesthetic at the incision sites.   The patient tolerated the procedure well. Sponge, lap, and needle counts were correct x 2. The patient was awakened in the Operating Room and taken to recovery in stable condition.     ____________________________ Will Bonnet, MD sdj:ts D: 04/27/2014 02:50:00 ET T: 04/27/2014 04:33:07 ET JOB#: [id number]  cc: Will Bonnet, MD, <Dictator> Will Bonnet MD ELECTRONICALLY SIGNED 05/07/2014 0:27

## 2014-08-04 ENCOUNTER — Other Ambulatory Visit: Payer: Self-pay | Admitting: *Deleted

## 2014-08-04 ENCOUNTER — Encounter: Payer: Self-pay | Admitting: Oncology

## 2014-08-04 ENCOUNTER — Inpatient Hospital Stay: Payer: BLUE CROSS/BLUE SHIELD | Attending: Oncology

## 2014-08-04 ENCOUNTER — Inpatient Hospital Stay: Payer: BLUE CROSS/BLUE SHIELD

## 2014-08-04 ENCOUNTER — Inpatient Hospital Stay (HOSPITAL_BASED_OUTPATIENT_CLINIC_OR_DEPARTMENT_OTHER): Payer: BLUE CROSS/BLUE SHIELD | Admitting: Oncology

## 2014-08-04 VITALS — BP 122/80 | HR 103 | Temp 96.4°F | Wt 124.6 lb

## 2014-08-04 DIAGNOSIS — L658 Other specified nonscarring hair loss: Secondary | ICD-10-CM | POA: Insufficient documentation

## 2014-08-04 DIAGNOSIS — C569 Malignant neoplasm of unspecified ovary: Secondary | ICD-10-CM | POA: Diagnosis not present

## 2014-08-04 DIAGNOSIS — R109 Unspecified abdominal pain: Secondary | ICD-10-CM | POA: Diagnosis not present

## 2014-08-04 DIAGNOSIS — Z87891 Personal history of nicotine dependence: Secondary | ICD-10-CM | POA: Insufficient documentation

## 2014-08-04 DIAGNOSIS — K219 Gastro-esophageal reflux disease without esophagitis: Secondary | ICD-10-CM

## 2014-08-04 DIAGNOSIS — Z5111 Encounter for antineoplastic chemotherapy: Secondary | ICD-10-CM | POA: Insufficient documentation

## 2014-08-04 DIAGNOSIS — M1990 Unspecified osteoarthritis, unspecified site: Secondary | ICD-10-CM | POA: Diagnosis not present

## 2014-08-04 DIAGNOSIS — Z79899 Other long term (current) drug therapy: Secondary | ICD-10-CM | POA: Diagnosis not present

## 2014-08-04 DIAGNOSIS — T451X5S Adverse effect of antineoplastic and immunosuppressive drugs, sequela: Secondary | ICD-10-CM | POA: Insufficient documentation

## 2014-08-04 DIAGNOSIS — R21 Rash and other nonspecific skin eruption: Secondary | ICD-10-CM

## 2014-08-04 DIAGNOSIS — G620 Drug-induced polyneuropathy: Secondary | ICD-10-CM | POA: Diagnosis not present

## 2014-08-04 DIAGNOSIS — Z888 Allergy status to other drugs, medicaments and biological substances status: Secondary | ICD-10-CM | POA: Diagnosis not present

## 2014-08-04 DIAGNOSIS — Z418 Encounter for other procedures for purposes other than remedying health state: Secondary | ICD-10-CM | POA: Insufficient documentation

## 2014-08-04 LAB — CBC WITH DIFFERENTIAL/PLATELET
Basophils Absolute: 0 10*3/uL (ref 0–0.1)
Basophils Relative: 0 %
Eosinophils Absolute: 0 10*3/uL (ref 0–0.7)
Eosinophils Relative: 0 %
HEMATOCRIT: 33 % — AB (ref 35.0–47.0)
HEMOGLOBIN: 11.2 g/dL — AB (ref 12.0–16.0)
LYMPHS ABS: 0.9 10*3/uL — AB (ref 1.0–3.6)
Lymphocytes Relative: 12 %
MCH: 33.8 pg (ref 26.0–34.0)
MCHC: 34 g/dL (ref 32.0–36.0)
MCV: 99.2 fL (ref 80.0–100.0)
Monocytes Absolute: 0.5 10*3/uL (ref 0.2–0.9)
Monocytes Relative: 7 %
NEUTROS ABS: 5.8 10*3/uL (ref 1.4–6.5)
Neutrophils Relative %: 81 %
PLATELETS: 222 10*3/uL (ref 150–440)
RBC: 3.33 MIL/uL — ABNORMAL LOW (ref 3.80–5.20)
RDW: 17.9 % — AB (ref 11.5–14.5)
WBC: 7.2 10*3/uL (ref 3.6–11.0)

## 2014-08-04 LAB — COMPREHENSIVE METABOLIC PANEL
ALT: 20 U/L (ref 14–54)
AST: 27 U/L (ref 15–41)
Albumin: 4.2 g/dL (ref 3.5–5.0)
Alkaline Phosphatase: 47 U/L (ref 38–126)
Anion gap: 5 (ref 5–15)
BILIRUBIN TOTAL: 0.6 mg/dL (ref 0.3–1.2)
BUN: 13 mg/dL (ref 6–20)
CO2: 20 mmol/L — ABNORMAL LOW (ref 22–32)
CREATININE: 0.61 mg/dL (ref 0.44–1.00)
Calcium: 8.9 mg/dL (ref 8.9–10.3)
Chloride: 110 mmol/L (ref 101–111)
GFR calc Af Amer: >60 mL/min (ref >60)
GLUCOSE: 134 mg/dL — AB (ref 65–99)
POTASSIUM: 3.1 mmol/L — AB (ref 3.5–5.1)
Sodium: 135 mmol/L (ref 135–145)
Total Protein: 6.6 g/dL (ref 6.5–8.1)

## 2014-08-04 LAB — MAGNESIUM: MAGNESIUM: 1.9 mg/dL (ref 1.7–2.4)

## 2014-08-04 MED ORDER — DIPHENHYDRAMINE HCL 50 MG/ML IJ SOLN
INTRAMUSCULAR | Status: AC
  Filled 2014-08-04: qty 1

## 2014-08-04 MED ORDER — DEXAMETHASONE 4 MG PO TABS: 4.0000 mg | ORAL_TABLET | Freq: Two times a day (BID) | ORAL | Status: DC

## 2014-08-04 MED ORDER — PALONOSETRON HCL INJECTION 0.25 MG/5ML
0.2500 mg | Freq: Once | INTRAVENOUS | Status: AC
  Administered 2014-08-04: 0.25 mg via INTRAVENOUS
  Filled 2014-08-04: qty 5

## 2014-08-04 MED ORDER — SODIUM CHLORIDE 0.9 % IV SOLN
Freq: Once | INTRAVENOUS | Status: AC
  Administered 2014-08-04: 12:00:00 via INTRAVENOUS
  Filled 2014-08-04: qty 5

## 2014-08-04 MED ORDER — SODIUM CHLORIDE 0.9 % IV SOLN
491.0000 mg | Freq: Once | INTRAVENOUS | Status: AC
  Administered 2014-08-04: 490 mg via INTRAVENOUS
  Filled 2014-08-04: qty 49

## 2014-08-04 MED ORDER — SODIUM CHLORIDE 0.9 % IV SOLN
Freq: Once | INTRAVENOUS | Status: AC
  Administered 2014-08-04: 12:00:00 via INTRAVENOUS
  Filled 2014-08-04: qty 250

## 2014-08-04 MED ORDER — HEPARIN SOD (PORK) LOCK FLUSH 100 UNIT/ML IV SOLN
500.0000 [IU] | Freq: Once | INTRAVENOUS | Status: AC | PRN
  Filled 2014-08-04: qty 5

## 2014-08-04 MED ORDER — DOCETAXEL CHEMO INJECTION 160 MG/16ML
70.0000 mg/m2 | Freq: Once | INTRAVENOUS | Status: AC
  Administered 2014-08-04: 110 mg via INTRAVENOUS
  Filled 2014-08-04: qty 11

## 2014-08-04 MED ORDER — DIPHENHYDRAMINE HCL 50 MG/ML IJ SOLN
25.0000 mg | Freq: Once | INTRAMUSCULAR | Status: AC
  Administered 2014-08-04: 25 mg via INTRAVENOUS

## 2014-08-04 MED ORDER — ONDANSETRON HCL 4 MG PO TABS: 4.0000 mg | ORAL_TABLET | Freq: Four times a day (QID) | ORAL | Status: DC | PRN

## 2014-08-04 MED ORDER — SODIUM CHLORIDE 0.9 % IJ SOLN
10.0000 mL | INTRAMUSCULAR | Status: DC | PRN
  Administered 2014-08-04: 10 mL
  Filled 2014-08-04: qty 10

## 2014-08-04 NOTE — Progress Notes (Signed)
[place] @ [facility] [phone]  Fax:(336) [place] OB: [birth year]  MR#: [id number]  QZR#:[id number]  Patient Care Team: [name], MD as PCP - General  CHIEF COMPLAINT:  Chief Complaint  Patient presents with  . 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 patient had severe allergic reaction to Taxol.  Given during desensitizing protocol with Taxol patient broke out in hives.  Patient is here for initiation of chemotherapy with carboplatinum and Taxotere (June 02, 2014)     Ovarian cancer   07/23/2014 Initial Diagnosis Ovarian cancer    No flowsheet data found.   INTERVAL HISTORY:  SHE  is here for further follow-up is finished total 6 cycles of chemotherapy for ovarian cancer. Grade 1 neuropathy.  Alopecia.  No other significant side effect.   REVIEW OF SYSTEMS:   Review of Systems  Constitutional: Negative for fever, chills, weight loss, malaise/fatigue and diaphoresis.  HENT: Negative for congestion, ear discharge, ear pain, hearing loss, nosebleeds, sore throat and tinnitus.   Eyes: Negative for blurred vision, double vision, photophobia, pain, discharge and redness.  Respiratory: Negative for cough, hemoptysis, sputum production, shortness of breath, wheezing and stridor.   Cardiovascular: Negative for chest pain, palpitations, orthopnea, claudication, leg swelling and PND.  Gastrointestinal: Negative for heartburn, nausea, vomiting, abdominal pain, diarrhea, constipation, blood in stool and melena.  Genitourinary: Negative for dysuria, urgency, frequency, hematuria and flank pain.  Musculoskeletal: Positive for joint pain. Negative for myalgias, back pain, falls and neck pain.  Skin: Positive for rash (Rash improved). Negative for itching.  Neurological: Negative for  dizziness, tingling, tremors, sensory change, speech change, focal weakness, seizures, loss of consciousness, weakness and headaches.  Endo/Heme/Allergies: Negative for environmental allergies and polydipsia. Does not bruise/bleed easily.  Psychiatric/Behavioral: Negative for depression, suicidal ideas, hallucinations, memory loss and substance abuse. The patient is not nervous/anxious and does not have insomnia.     As per HPI. Otherwise, a complete review of systems is negatve.  PAST MEDICAL HISTORY: Past Medical History  Diagnosis Date  . Dizziness     vertigo  . GERD (gastroesophageal reflux disease)   . Anxiety   . Gallstones   . Benign fundic gland polyps of stomach   . Ovarian cancer   . Arthritis     PAST SURGICAL HISTORY: Past Surgical History  Procedure Laterality Date  . Cholecystectomy    . Cesarean section    . Laparoscopic oopherectomy    . Colonoscopy    . Upper gi endoscopy    . Oophorectomy      FAMILY HISTORY Family History  Problem Relation Age of Onset  . Hypertension Mother   . Hyperlipidemia Mother   . GER disease Mother   . Heart disease Paternal Grandmother   . Heart disease Father   . Breast cancer Paternal Grandmother   . Kidney disease Neg Hx   . Liver disease Neg Hx   . Colon cancer Neg Hx   . Colon polyps Neg Hx   . Esophageal cancer Neg Hx   . Pancreatic cancer Neg Hx     GYNECOLOGIC HISTORY:  Patient's last menstrual period was 03/30/2013.     ADVANCED DIRECTIVES:    HEALTH MAINTENANCE: History  Substance Use Topics  . Smoking status: Former Research scientist (life sciences)  . Smokeless  tobacco: Never Used  . Alcohol Use: 0.0 oz/week    0 Standard drinks or equivalent per week     Comment: 1 drink daily     Colonoscopy:  PAP:  Bone density:  Lipid panel:  Allergies  Allergen Reactions  . Ciprofloxacin Anaphylaxis  . Paclitaxel Shortness Of Breath and Other (See Comments)    Other reaction(s): Tight chest (finding) Chest and facial  flushing Flushing, chest tightness, SOB w/ Taxol on 05/26/14 at [place].    Current Outpatient Prescriptions  Medication Sig Dispense Refill  . dexamethasone (DECADRON) 4 MG tablet Take 1 tablet (4 mg total) by mouth 2 (two) times daily with a meal. Day before chemo and day after chemo 30 tablet 0  . lidocaine-prilocaine (EMLA) cream Apply 1 application topically as needed.    . loratadine (CLARITIN) 10 MG tablet Take 10 mg by mouth daily.    Marland Kitchen LORazepam (ATIVAN) 0.5 MG tablet Take 0.5 mg by mouth every 8 (eight) hours.    Marland Kitchen LORazepam (ATIVAN) 1 MG tablet Take 1 mg by mouth every 6 (six) hours as needed for anxiety.    Marland Kitchen omeprazole (PRILOSEC) 40 MG capsule Take 1 capsule (40 mg total) by mouth daily before breakfast. 30 capsule 11  . ondansetron (ZOFRAN) 4 MG tablet Take 1 tablet (4 mg total) by mouth every 6 (six) hours as needed for nausea or vomiting. 30 tablet 3  . potassium chloride (K-DUR) 10 MEQ tablet Take 10 mEq by mouth daily.    . COMBIPATCH 0.05-0.25 MG/DAY   0  . hydrocortisone (ANUCORT-HC) 25 MG suppository Place 1 suppository (25 mg total) rectally 2 (two) times daily. (Patient not taking: Reported on 04/16/2014) 14 suppository 1  . ibuprofen (ADVIL,MOTRIN) 600 MG tablet Take 600 mg by mouth every 6 (six) hours as needed.    . ranitidine (ZANTAC) 150 MG tablet Take 300mg  by mouth daily at bedtime (Patient not taking: Reported on 07/15/2014) 60 tablet 3   No current facility-administered medications for this visit.    OBJECTIVE: Filed Vitals:   08/04/14 0859  BP: 122/80  Pulse: 103  Temp: 96.4 F (35.8 C)     Body mass index is 21.74 kg/(m^2).    ECOG FS:0 - Asymptomatic  Physical Exam  Constitutional: She is oriented to person, place, and time and well-developed, well-nourished, and in no distress. No distress.  HENT:  Head: Atraumatic.  Right Ear: External ear normal.  Left Ear: External ear normal.  Nose: Nose normal.  Mouth/Throat: Oropharynx is clear and  moist.  Eyes: Conjunctivae and EOM are normal.  Neck: Normal range of motion. No JVD present. No tracheal deviation present. No thyromegaly present.  Cardiovascular: Normal rate and regular rhythm.  Exam reveals no friction rub.   No murmur heard. Pulmonary/Chest: No stridor. No respiratory distress. She has no wheezes. She has no rales. She exhibits no tenderness.  Abdominal: Soft. Bowel sounds are normal. She exhibits no distension and no mass. There is no tenderness. There is no rebound.  Musculoskeletal: She exhibits no edema or tenderness.  Neurological: She is alert and oriented to person, place, and time. She has normal reflexes. Gait normal. GCS score is 15.  Skin: Skin is warm and dry. No rash noted. She is not diaphoretic.  Psychiatric: Mood, memory and affect normal.     LAB RESULTS:     Component Value Date/Time   NA 135 08/04/2014 0835   NA 135 07/14/2014 0955   K 3.1* 08/04/2014 7622  K 3.2* 07/21/2014 1518   CL 110 08/04/2014 0835   CL 106 07/14/2014 0955   CO2 20* 08/04/2014 0835   CO2 21* 07/14/2014 0955   GLUCOSE 134* 08/04/2014 0835   GLUCOSE 138* 07/14/2014 0955   BUN 13 08/04/2014 0835   BUN 10 07/14/2014 0955   CREATININE 0.61 08/04/2014 0835   CREATININE 0.60 07/14/2014 0955   CALCIUM 8.9 08/04/2014 0835   CALCIUM 9.1 07/14/2014 0955   PROT 6.6 08/04/2014 0835   PROT 7.1 07/14/2014 0955   ALBUMIN 4.2 08/04/2014 0835   ALBUMIN 4.4 07/14/2014 0955   AST 27 08/04/2014 0835   AST 24 07/14/2014 0955   ALT 20 08/04/2014 0835   ALT 20 07/14/2014 0955   ALKPHOS 47 08/04/2014 0835   ALKPHOS 45 07/14/2014 0955   BILITOT 0.6 08/04/2014 0835   GFRNONAA >60 08/04/2014 0835   GFRNONAA >60 07/14/2014 0955   GFRAA >60 08/04/2014 0835   GFRAA >60 07/14/2014 0955    No results found for: SPEP, UPEP  Lab Results  Component Value Date   WBC 6.9 07/28/2014   NEUTROABS 4.8 07/28/2014   HGB 12.3 07/28/2014   HCT 36.2 07/28/2014   MCV 97 07/28/2014   PLT  150 07/28/2014      Chemistry      Component Value Date/Time   NA 135 08/04/2014 0835   NA 135 07/14/2014 0955   K 3.1* 08/04/2014 0835   K 3.2* 07/21/2014 1518   CL 110 08/04/2014 0835   CL 106 07/14/2014 0955   CO2 20* 08/04/2014 0835   CO2 21* 07/14/2014 0955   BUN 13 08/04/2014 0835   BUN 10 07/14/2014 0955   CREATININE 0.61 08/04/2014 0835   CREATININE 0.60 07/14/2014 0955      Component Value Date/Time   CALCIUM 8.9 08/04/2014 0835   CALCIUM 9.1 07/14/2014 0955   ALKPHOS 47 08/04/2014 0835   ALKPHOS 45 07/14/2014 0955   AST 27 08/04/2014 0835   AST 24 07/14/2014 0955   ALT 20 08/04/2014 0835   ALT 20 07/14/2014 0955   BILITOT 0.6 08/04/2014 0835       Lab Results  Component Value Date   CA125 18.0 10/08/2014       Component Value Date/Time   COLORURINE yellow 11/05/2008 1453   APPEARANCEUR Clear 11/05/2008 1453   LABSPEC <1.005 11/05/2008 1453   PHURINE 7.0 11/05/2008 1453   HGBUR moderate 11/05/2008 1453   BILIRUBINUR neg. 03/04/2014 1558   BILIRUBINUR negative 11/05/2008 1453   PROTEINUR neg. 03/04/2014 1558   UROBILINOGEN 0.2 03/04/2014 1558   UROBILINOGEN 0.2 11/05/2008 1453   NITRITE neg. 03/04/2014 1558   NITRITE negative 11/05/2008 1453   LEUKOCYTESUR Trace 03/04/2014 1558    STUDIES: No results found.  ASSESSMENT: Carcinoma of ovary stage IIb status post initial surgery Patient has finished total 6 cycles of chemotherapy No significant side effect except for alopecia and grade 1 neuropathy  PLAN:   CT scan of abdomen and pelvis has been reviewed.  CT scan of the chest has been reviewed there is no evidence of recurrent disease.  CA-125 is within normal limit. Patient will be referred back to Dr. Alto Denver for possibility of surgical intervention.\ Total duration of visit was 45 minutes.  50% or more time was spent in counseling patient and family regarding prognosis and options of treatment and available resources Patient expressed  understanding and was in agreement with this plan. She also understands that She can call clinic at any time  with any questions, concerns, or complaints.    Ovarian cancer   Staging form: Ovary, AJCC 7th Edition     Clinical stage from 07/23/2014: Stage Unknown (yT2c, NX, M0) - Signed by Evlyn Kanner, NP on 07/23/2014   Forest Gleason, MD   08/04/2014 10:30 AM

## 2014-08-05 ENCOUNTER — Inpatient Hospital Stay: Payer: BLUE CROSS/BLUE SHIELD | Admitting: *Deleted

## 2014-08-05 VITALS — BP 105/74 | HR 87 | Temp 98.3°F | Resp 18

## 2014-08-05 DIAGNOSIS — C569 Malignant neoplasm of unspecified ovary: Secondary | ICD-10-CM | POA: Diagnosis not present

## 2014-08-05 MED ORDER — PEGFILGRASTIM INJECTION 6 MG/0.6ML
6.0000 mg | Freq: Once | SUBCUTANEOUS | Status: AC
  Administered 2014-08-05: 6 mg via SUBCUTANEOUS
  Filled 2014-08-05: qty 0.6

## 2014-08-11 ENCOUNTER — Telehealth: Payer: Self-pay | Admitting: *Deleted

## 2014-08-11 ENCOUNTER — Other Ambulatory Visit: Payer: Self-pay | Admitting: *Deleted

## 2014-08-11 ENCOUNTER — Inpatient Hospital Stay: Payer: BLUE CROSS/BLUE SHIELD

## 2014-08-11 DIAGNOSIS — E876 Hypokalemia: Secondary | ICD-10-CM

## 2014-08-11 DIAGNOSIS — C569 Malignant neoplasm of unspecified ovary: Secondary | ICD-10-CM

## 2014-08-11 LAB — CBC WITH DIFFERENTIAL/PLATELET
Basophils Absolute: 0.1 10*3/uL (ref 0–0.1)
Basophils Relative: 1 %
EOS ABS: 0 10*3/uL (ref 0–0.7)
HEMATOCRIT: 35.1 % (ref 35.0–47.0)
HEMOGLOBIN: 12 g/dL (ref 12.0–16.0)
LYMPHS ABS: 2 10*3/uL (ref 1.0–3.6)
MCH: 33.3 pg (ref 26.0–34.0)
MCHC: 34.1 g/dL (ref 32.0–36.0)
MCV: 97.6 fL (ref 80.0–100.0)
Monocytes Absolute: 1.7 10*3/uL — ABNORMAL HIGH (ref 0.2–0.9)
Monocytes Relative: 12 %
Neutro Abs: 10.3 10*3/uL — ABNORMAL HIGH (ref 1.4–6.5)
Neutrophils Relative %: 73 %
Platelets: 180 10*3/uL (ref 150–440)
RBC: 3.59 MIL/uL — ABNORMAL LOW (ref 3.80–5.20)
RDW: 16.5 % — ABNORMAL HIGH (ref 11.5–14.5)
WBC: 14.2 10*3/uL — ABNORMAL HIGH (ref 3.6–11.0)

## 2014-08-11 LAB — POTASSIUM: POTASSIUM: 3.2 mmol/L — AB (ref 3.5–5.1)

## 2014-08-11 NOTE — Telephone Encounter (Signed)
Called pt to inform her K+ was 3.2 today. MD wants her to increase her K+ from 10 meq to 20 meq daily.  Will recheck at next lab appt. Pt verbalized understanding.

## 2014-08-18 ENCOUNTER — Inpatient Hospital Stay: Payer: BLUE CROSS/BLUE SHIELD

## 2014-08-18 DIAGNOSIS — C569 Malignant neoplasm of unspecified ovary: Secondary | ICD-10-CM | POA: Diagnosis not present

## 2014-08-18 LAB — CBC WITH DIFFERENTIAL/PLATELET
Basophils Absolute: 0 10*3/uL (ref 0–0.1)
Basophils Relative: 1 %
Eosinophils Absolute: 0 10*3/uL (ref 0–0.7)
Eosinophils Relative: 0 %
HCT: 36.5 % (ref 35.0–47.0)
HEMOGLOBIN: 12.2 g/dL (ref 12.0–16.0)
LYMPHS ABS: 1.6 10*3/uL (ref 1.0–3.6)
LYMPHS PCT: 20 %
MCH: 33.5 pg (ref 26.0–34.0)
MCHC: 33.5 g/dL (ref 32.0–36.0)
MCV: 100 fL (ref 80.0–100.0)
MONOS PCT: 5 %
Monocytes Absolute: 0.4 10*3/uL (ref 0.2–0.9)
NEUTROS ABS: 5.8 10*3/uL (ref 1.4–6.5)
Neutrophils Relative %: 74 %
Platelets: 146 10*3/uL — ABNORMAL LOW (ref 150–440)
RBC: 3.65 MIL/uL — AB (ref 3.80–5.20)
RDW: 16.8 % — ABNORMAL HIGH (ref 11.5–14.5)
WBC: 7.8 10*3/uL (ref 3.6–11.0)

## 2014-08-25 ENCOUNTER — Encounter (INDEPENDENT_AMBULATORY_CARE_PROVIDER_SITE_OTHER): Payer: Self-pay

## 2014-08-25 ENCOUNTER — Inpatient Hospital Stay (HOSPITAL_BASED_OUTPATIENT_CLINIC_OR_DEPARTMENT_OTHER): Payer: BLUE CROSS/BLUE SHIELD | Admitting: Oncology

## 2014-08-25 ENCOUNTER — Inpatient Hospital Stay: Payer: BLUE CROSS/BLUE SHIELD

## 2014-08-25 VITALS — BP 118/78 | HR 102 | Temp 97.0°F | Wt 128.3 lb

## 2014-08-25 DIAGNOSIS — K219 Gastro-esophageal reflux disease without esophagitis: Secondary | ICD-10-CM

## 2014-08-25 DIAGNOSIS — F419 Anxiety disorder, unspecified: Secondary | ICD-10-CM

## 2014-08-25 DIAGNOSIS — Z888 Allergy status to other drugs, medicaments and biological substances status: Secondary | ICD-10-CM | POA: Diagnosis not present

## 2014-08-25 DIAGNOSIS — Z90722 Acquired absence of ovaries, bilateral: Secondary | ICD-10-CM | POA: Diagnosis not present

## 2014-08-25 DIAGNOSIS — M1990 Unspecified osteoarthritis, unspecified site: Secondary | ICD-10-CM

## 2014-08-25 DIAGNOSIS — R109 Unspecified abdominal pain: Secondary | ICD-10-CM | POA: Diagnosis not present

## 2014-08-25 DIAGNOSIS — C569 Malignant neoplasm of unspecified ovary: Secondary | ICD-10-CM

## 2014-08-25 DIAGNOSIS — Z79899 Other long term (current) drug therapy: Secondary | ICD-10-CM

## 2014-08-25 LAB — CBC WITH DIFFERENTIAL/PLATELET
BASOS PCT: 0 %
Basophils Absolute: 0 10*3/uL (ref 0–0.1)
EOS ABS: 0 10*3/uL (ref 0–0.7)
Eosinophils Relative: 0 %
HCT: 34.5 % — ABNORMAL LOW (ref 35.0–47.0)
Hemoglobin: 11.9 g/dL — ABNORMAL LOW (ref 12.0–16.0)
LYMPHS PCT: 13 %
Lymphs Abs: 0.9 10*3/uL — ABNORMAL LOW (ref 1.0–3.6)
MCH: 34.6 pg — ABNORMAL HIGH (ref 26.0–34.0)
MCHC: 34.6 g/dL (ref 32.0–36.0)
MCV: 99.8 fL (ref 80.0–100.0)
MONO ABS: 0.6 10*3/uL (ref 0.2–0.9)
Monocytes Relative: 9 %
Neutro Abs: 5.3 10*3/uL (ref 1.4–6.5)
Neutrophils Relative %: 78 %
Platelets: 200 10*3/uL (ref 150–440)
RBC: 3.45 MIL/uL — ABNORMAL LOW (ref 3.80–5.20)
RDW: 16.3 % — ABNORMAL HIGH (ref 11.5–14.5)
WBC: 6.8 10*3/uL (ref 3.6–11.0)

## 2014-08-25 LAB — COMPREHENSIVE METABOLIC PANEL
ALBUMIN: 4.4 g/dL (ref 3.5–5.0)
ALT: 24 U/L (ref 14–54)
AST: 36 U/L (ref 15–41)
Alkaline Phosphatase: 45 U/L (ref 38–126)
Anion gap: 7 (ref 5–15)
BUN: 14 mg/dL (ref 6–20)
CHLORIDE: 105 mmol/L (ref 101–111)
CO2: 21 mmol/L — ABNORMAL LOW (ref 22–32)
Calcium: 8.8 mg/dL — ABNORMAL LOW (ref 8.9–10.3)
Creatinine, Ser: 0.68 mg/dL (ref 0.44–1.00)
Glucose, Bld: 140 mg/dL — ABNORMAL HIGH (ref 65–99)
POTASSIUM: 3.1 mmol/L — AB (ref 3.5–5.1)
Sodium: 133 mmol/L — ABNORMAL LOW (ref 135–145)
TOTAL PROTEIN: 6.9 g/dL (ref 6.5–8.1)
Total Bilirubin: 0.9 mg/dL (ref 0.3–1.2)

## 2014-08-25 MED ORDER — DIPHENHYDRAMINE HCL 50 MG/ML IJ SOLN
25.0000 mg | Freq: Once | INTRAMUSCULAR | Status: AC
  Administered 2014-08-25: 25 mg via INTRAVENOUS
  Filled 2014-08-25: qty 1

## 2014-08-25 MED ORDER — SODIUM CHLORIDE 0.9 % IV SOLN
Freq: Once | INTRAVENOUS | Status: AC
  Administered 2014-08-25: 10:00:00 via INTRAVENOUS
  Filled 2014-08-25: qty 250

## 2014-08-25 MED ORDER — SODIUM CHLORIDE 0.9 % IV SOLN
Freq: Once | INTRAVENOUS | Status: AC
  Administered 2014-08-25: 10:00:00 via INTRAVENOUS
  Filled 2014-08-25: qty 5

## 2014-08-25 MED ORDER — HEPARIN SOD (PORK) LOCK FLUSH 100 UNIT/ML IV SOLN: 500.0000 [IU] | Freq: Once | INTRAVENOUS | Status: AC

## 2014-08-25 MED ORDER — PALONOSETRON HCL INJECTION 0.25 MG/5ML
0.2500 mg | Freq: Once | INTRAVENOUS | Status: AC
  Administered 2014-08-25: 0.25 mg via INTRAVENOUS
  Filled 2014-08-25: qty 5

## 2014-08-25 MED ORDER — HEPARIN SOD (PORK) LOCK FLUSH 100 UNIT/ML IV SOLN
500.0000 [IU] | Freq: Once | INTRAVENOUS | Status: AC | PRN
  Administered 2014-08-25: 500 [IU]
  Filled 2014-08-25: qty 5

## 2014-08-25 MED ORDER — SODIUM CHLORIDE 0.9 % IJ SOLN
10.0000 mL | INTRAMUSCULAR | Status: DC | PRN
  Administered 2014-08-25: 10 mL via INTRAVENOUS
  Filled 2014-08-25: qty 10

## 2014-08-25 MED ORDER — POTASSIUM CHLORIDE 20 MEQ/100ML IV SOLN: 20.0000 meq | Freq: Once | INTRAVENOUS | Status: DC

## 2014-08-25 MED ORDER — SODIUM CHLORIDE 0.9 % IJ SOLN
10.0000 mL | INTRAMUSCULAR | Status: DC | PRN
  Filled 2014-08-25: qty 10

## 2014-08-25 MED ORDER — ESTRADIOL-NORETHINDRONE ACET 0.05-0.14 MG/DAY TD PTTW: 1.0000 | MEDICATED_PATCH | TRANSDERMAL | Status: DC

## 2014-08-25 MED ORDER — FAMOTIDINE IN NACL 20-0.9 MG/50ML-% IV SOLN
20.0000 mg | Freq: Once | INTRAVENOUS | Status: AC
  Administered 2014-08-25: 20 mg via INTRAVENOUS
  Filled 2014-08-25: qty 50

## 2014-08-25 MED ORDER — SODIUM CHLORIDE 0.9 % IV SOLN
491.0000 mg | Freq: Once | INTRAVENOUS | Status: AC
  Administered 2014-08-25: 490 mg via INTRAVENOUS
  Filled 2014-08-25: qty 49

## 2014-08-25 MED ORDER — DOCETAXEL CHEMO INJECTION 160 MG/16ML
70.0000 mg/m2 | Freq: Once | INTRAVENOUS | Status: AC
  Administered 2014-08-25: 110 mg via INTRAVENOUS
  Filled 2014-08-25: qty 11

## 2014-08-26 ENCOUNTER — Encounter: Payer: Self-pay | Admitting: Oncology

## 2014-08-26 ENCOUNTER — Ambulatory Visit: Payer: BLUE CROSS/BLUE SHIELD

## 2014-08-26 LAB — CA 125: CA 125: 16.1 U/mL (ref 0.0–38.1)

## 2014-08-26 NOTE — Progress Notes (Signed)
[place] @ [facility] Telephone:([phone]  Fax:(336) [place] OB: [birth year]  MR#: [id number]  [id number]#:[id number]  Patient Care Team: [name], MD as PCP - General  CHIEF COMPLAINT:  Chief Complaint  Patient presents with  . 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 patient had severe allergic reaction to Taxol.  Given during desensitizing protocol with Taxol patient broke out in hives.  Patient is here for initiation of chemotherapy with carboplatinum and Taxotere (June 02, 2014)     Ovarian cancer   07/23/2014 Initial Diagnosis Ovarian cancer    Oncology Flowsheet 08/04/2014 08/05/2014 08/25/2014  Day, Cycle Day 1, Cycle 4 - Day 1, Cycle 5  CARBOplatin (PARAPLATIN) IV 490 mg - 490 mg  dexamethasone (DECADRON) IV [ 12 mg ] - [ 12 mg ]  DOCEtaxel (TAXOTERE) IV 70 mg/m2 - 70 mg/m2  fosaprepitant (EMEND) IV [ 150 mg ] - [ 150 mg ]  palonosetron (ALOXI) IV 0.25 mg - 0.25 mg  pegfilgrastim (NEULASTA) [place] - 6 mg -    INTERVAL HISTORY: 54 year old lady came today with a history of stage III ovarian cancer.  This is the fifth cycle of chemotherapy.  Tolerating treatment very well.  No chills.  No fever.  No rash.  No tingling numbness.  Patient has intermittent abdominal pain.  REVIEW OF SYSTEMS:   Review of Systems  Gastrointestinal: Negative for melena.   Gen. status: No chills and fever.  Tiredness. HEENT: No headache.  No dizziness.  No soreness in the mouth.  No difficulty in swallowing.  No neck pain.  No palpable masses in the neck.  Alopecia. Cardiac: No chest pain.  No paroxysmal dyspnea.  No orthopnea.  No palpitation Lungs: No cough.  No hemoptysis.  No chest pain.  No shortness of breath. Abdomen: No abdominal pain.  No diarrhea.  No nausea.  No vomiting.  Appetite is stable.  No rectal  bleeding.  Tenderness in epigastric area and the left upper quadrant intermittent no constipation Skin: No ecchymosis.  No rash.  No itching. Neuro: No headache.  No dizziness.  No weakness in upper or lower extremity.  No evidence of tingling numbness. GU: No hematuria or dysuria As per HPI. Otherwise, a complete review of systems is negatve.  PAST MEDICAL HISTORY: Past Medical History  Diagnosis Date  . Dizziness     vertigo  . GERD (gastroesophageal reflux disease)   . Anxiety   . Gallstones   . Benign fundic gland polyps of stomach   . Ovarian cancer   . Arthritis     PAST SURGICAL HISTORY: Past Surgical History  Procedure Laterality Date  . Cholecystectomy    . Cesarean section    . Laparoscopic oopherectomy    . Colonoscopy    . Upper gi endoscopy    . Oophorectomy      FAMILY HISTORY Family History  Problem Relation Age of Onset  . Hypertension Mother   . Hyperlipidemia Mother   . GER disease Mother   . Heart disease Paternal Grandmother   . Heart disease Father   . Breast cancer Paternal Grandmother   . Kidney disease Neg Hx   . Liver disease Neg Hx   . Colon cancer Neg Hx   . Colon polyps Neg Hx   .  Esophageal cancer Neg Hx   . Pancreatic cancer Neg Hx     GYNECOLOGIC HISTORY:  Patient's last menstrual period was 03/30/2013.     ADVANCED DIRECTIVES: Does not have advance care directive.. Information has been given.  Appointment with chaplain has been discussed with the patient and family   HEALTH MAINTENANCE: History  Substance Use Topics  . Smoking status: Former Research scientist (life sciences)  . Smokeless tobacco: Never Used  . Alcohol Use: 0.0 oz/week    0 Standard drinks or equivalent per week     Comment: 1 drink daily     Colonoscopy:  PAP:  Bone density:  Lipid panel:  Allergies  Allergen Reactions  . Ciprofloxacin Anaphylaxis  . Paclitaxel Shortness Of Breath and Other (See Comments)    Other reaction(s): Tight chest (finding) Chest and facial  flushing Flushing, chest tightness, SOB w/ Taxol on 05/26/14 at [place].    Current Outpatient Prescriptions  Medication Sig Dispense Refill  . dexamethasone (DECADRON) 4 MG tablet Take 1 tablet (4 mg total) by mouth 2 (two) times daily with a meal. Day before chemo and day after chemo 30 tablet 0  . lidocaine-prilocaine (EMLA) cream Apply 1 application topically as needed.    . loratadine (CLARITIN) 10 MG tablet Take 10 mg by mouth daily.    Marland Kitchen LORazepam (ATIVAN) 0.5 MG tablet Take 0.5 mg by mouth every 8 (eight) hours.    Marland Kitchen omeprazole (PRILOSEC) 40 MG capsule Take 1 capsule (40 mg total) by mouth daily before breakfast. 30 capsule 11  . ondansetron (ZOFRAN) 4 MG tablet Take 1 tablet (4 mg total) by mouth every 6 (six) hours as needed for nausea or vomiting. 30 tablet 3  . potassium chloride (K-DUR) 10 MEQ tablet Take 20 mEq by mouth daily.     . ranitidine (ZANTAC) 150 MG tablet Take 300mg  by mouth daily at bedtime 60 tablet 3  . COMBIPATCH 0.05-0.25 MG/DAY   0  . estradiol-norethindrone (COMBIPATCH) 0.05-0.14 MG/DAY Place 1 patch onto the skin 2 (two) times a week. 8 patch 0  . hydrocortisone (ANUCORT-HC) 25 MG suppository Place 1 suppository (25 mg total) rectally 2 (two) times daily. (Patient not taking: Reported on 04/16/2014) 14 suppository 1  . ibuprofen (ADVIL,MOTRIN) 600 MG tablet Take 600 mg by mouth every 6 (six) hours as needed.    Marland Kitchen LORazepam (ATIVAN) 1 MG tablet Take 1 mg by mouth every 6 (six) hours as needed for anxiety.     No current facility-administered medications for this visit.   Facility-Administered Medications Ordered in Other Visits  Medication Dose Route Frequency Provider Last Rate Last Dose  . sodium chloride 0.9 % injection 10 mL  10 mL Intracatheter PRN Forest Gleason, MD   10 mL at 08/04/14 1130    OBJECTIVE: Filed Vitals:   08/25/14 0858  BP: 118/78  Pulse: 102  Temp: 97 F (36.1 C)     Body mass index is 22.4 kg/(m^2).    ECOG FS:0 -  Asymptomatic  Physical Exam  Constitutional: She is oriented to person, place, and time and well-developed, well-nourished, and in no distress. No distress.  HENT:  Head: Atraumatic.  Right Ear: External ear normal.  Left Ear: External ear normal.  Nose: Nose normal.  Mouth/Throat: Oropharynx is clear and moist.  Eyes: Conjunctivae and EOM are normal.  Neck: Normal range of motion. No JVD present. No tracheal deviation present. No thyromegaly present.  Cardiovascular: Normal rate and regular rhythm.  Exam reveals no friction rub.  No murmur heard. Pulmonary/Chest: No stridor. No respiratory distress. She has no wheezes. She has no rales. She exhibits no tenderness.  Abdominal: Soft. Bowel sounds are normal. She exhibits no distension and no mass. There is no tenderness. There is no rebound.  Musculoskeletal: She exhibits no edema or tenderness.  Neurological: She is alert and oriented to person, place, and time. She has normal reflexes. Gait normal. GCS score is 15.  Skin: Skin is warm and dry. No rash noted. She is not diaphoretic.  Psychiatric: Mood, memory and affect normal.     LAB RESULTS:     Component Value Date/Time   NA 133* 08/25/2014 0839   NA 135 07/14/2014 0955   K 3.1* 08/25/2014 0839   K 3.2* 07/21/2014 1518   CL 105 08/25/2014 0839   CL 106 07/14/2014 0955   CO2 21* 08/25/2014 0839   CO2 21* 07/14/2014 0955   GLUCOSE 140* 08/25/2014 0839   GLUCOSE 138* 07/14/2014 0955   BUN 14 08/25/2014 0839   BUN 10 07/14/2014 0955   CREATININE 0.68 08/25/2014 0839   CREATININE 0.60 07/14/2014 0955   CALCIUM 8.8* 08/25/2014 0839   CALCIUM 9.1 07/14/2014 0955   PROT 6.9 08/25/2014 0839   PROT 7.1 07/14/2014 0955   ALBUMIN 4.4 08/25/2014 0839   ALBUMIN 4.4 07/14/2014 0955   AST 36 08/25/2014 0839   AST 24 07/14/2014 0955   ALT 24 08/25/2014 0839   ALT 20 07/14/2014 0955   ALKPHOS 45 08/25/2014 0839   ALKPHOS 45 07/14/2014 0955   BILITOT 0.9 08/25/2014 0839    GFRNONAA >60 08/25/2014 0839   GFRNONAA >60 07/14/2014 0955   GFRAA >60 08/25/2014 0839   GFRAA >60 07/14/2014 0955    No results found for: SPEP, UPEP  Lab Results  Component Value Date   WBC 6.8 08/25/2014   NEUTROABS 5.3 08/25/2014   HGB 11.9* 08/25/2014   HCT 34.5* 08/25/2014   MCV 99.8 08/25/2014   PLT 200 08/25/2014      Chemistry      Component Value Date/Time   NA 133* 08/25/2014 0839   NA 135 07/14/2014 0955   K 3.1* 08/25/2014 0839   K 3.2* 07/21/2014 1518   CL 105 08/25/2014 0839   CL 106 07/14/2014 0955   CO2 21* 08/25/2014 0839   CO2 21* 07/14/2014 0955   BUN 14 08/25/2014 0839   BUN 10 07/14/2014 0955   CREATININE 0.68 08/25/2014 0839   CREATININE 0.60 07/14/2014 0955      Component Value Date/Time   CALCIUM 8.8* 08/25/2014 0839   CALCIUM 9.1 07/14/2014 0955   ALKPHOS 45 08/25/2014 0839   ALKPHOS 45 07/14/2014 0955   AST 36 08/25/2014 0839   AST 24 07/14/2014 0955   ALT 24 08/25/2014 0839   ALT 20 07/14/2014 0955   BILITOT 0.9 08/25/2014 0839       No results found for: LABCA2  No components found for: ONGEX528  No results for input(s): INR in the last 168 hours.     Component Value Date/Time   COLORURINE yellow 11/05/2008 1453   APPEARANCEUR Clear 11/05/2008 1453   LABSPEC <1.005 11/05/2008 1453   PHURINE 7.0 11/05/2008 1453   HGBUR moderate 11/05/2008 1453   BILIRUBINUR neg. 03/04/2014 1558   BILIRUBINUR negative 11/05/2008 1453   PROTEINUR neg. 03/04/2014 1558   UROBILINOGEN 0.2 03/04/2014 1558   UROBILINOGEN 0.2 11/05/2008 1453   NITRITE neg. 03/04/2014 1558   NITRITE negative 11/05/2008 1453   LEUKOCYTESUR Trace 03/04/2014  59     ASSESSMENT: Carcinoma of ovary stage IIb status post initial surgery Patient starting neoadjuvant chemotherapy Patient has received fifth cycle of chemotherapy.  Overall tolerance is fairly good Tolerating Neulasta very well In my absence patient would be seen by a nurse practitioner or my  associate and patient will receive 6 cycle of chemotherapy.  After that a CT scan of abdomen pelvis and chest would be ordered and patient will be evaluated by a doctor [place] or Dr. Georgia Dom for debulking surgery  PLAN:  No problem-specific assessment & plan notes found for this encounter. 1.  Carcinoma of ovary patient is starting fourth cycle of chemotherapy Tolerating treatment very well  Patient expressed understanding and was in agreement with this plan. She also understands that She can call clinic at any time with any questions, concerns, or complaints.    Ovarian cancer   Staging form: Ovary, AJCC 7th Edition     Clinical stage from 07/23/2014: Stage Unknown (yT2c, NX, M0) - Signed by Evlyn Kanner, NP on 07/23/2014   Forest Gleason, MD   08/26/2014 8:30 AM

## 2014-08-27 ENCOUNTER — Inpatient Hospital Stay: Payer: BLUE CROSS/BLUE SHIELD

## 2014-08-27 DIAGNOSIS — C569 Malignant neoplasm of unspecified ovary: Secondary | ICD-10-CM

## 2014-08-27 MED ORDER — PEGFILGRASTIM INJECTION 6 MG/0.6ML
6.0000 mg | Freq: Once | SUBCUTANEOUS | Status: AC
  Administered 2014-08-27: 6 mg via SUBCUTANEOUS
  Filled 2014-08-27: qty 0.6

## 2014-09-02 ENCOUNTER — Inpatient Hospital Stay: Payer: BLUE CROSS/BLUE SHIELD

## 2014-09-02 DIAGNOSIS — C569 Malignant neoplasm of unspecified ovary: Secondary | ICD-10-CM

## 2014-09-02 LAB — CBC WITH DIFFERENTIAL/PLATELET
Basophils Absolute: 0.1 10*3/uL (ref 0–0.1)
Basophils Relative: 1 %
EOS PCT: 0 %
Eosinophils Absolute: 0 10*3/uL (ref 0–0.7)
HEMATOCRIT: 33.6 % — AB (ref 35.0–47.0)
HEMOGLOBIN: 11.3 g/dL — AB (ref 12.0–16.0)
LYMPHS ABS: 2.1 10*3/uL (ref 1.0–3.6)
LYMPHS PCT: 16 %
MCH: 33.7 pg (ref 26.0–34.0)
MCHC: 33.8 g/dL (ref 32.0–36.0)
MCV: 99.9 fL (ref 80.0–100.0)
MONOS PCT: 9 %
Monocytes Absolute: 1.2 10*3/uL — ABNORMAL HIGH (ref 0.2–0.9)
Neutro Abs: 9.6 10*3/uL — ABNORMAL HIGH (ref 1.4–6.5)
Neutrophils Relative %: 74 %
Platelets: 128 10*3/uL — ABNORMAL LOW (ref 150–440)
RBC: 3.36 MIL/uL — AB (ref 3.80–5.20)
RDW: 14.9 % — ABNORMAL HIGH (ref 11.5–14.5)
WBC: 12.9 10*3/uL — ABNORMAL HIGH (ref 3.6–11.0)

## 2014-09-03 ENCOUNTER — Telehealth: Payer: Self-pay | Admitting: *Deleted

## 2014-09-03 NOTE — Telephone Encounter (Signed)
Patient states she felt really bad after her last neulasta injection due to it being delayed 48 hrs after her chemo tx. Wants to know if she can be given additional steroids to get her through that period.  Will discuss at next appt, per [place].

## 2014-09-04 NOTE — Telephone Encounter (Signed)
Called patient back to inform her this will be taken care of at her next appointment.  Patient verbalized understanding.

## 2014-09-08 ENCOUNTER — Inpatient Hospital Stay: Payer: BLUE CROSS/BLUE SHIELD | Attending: Oncology

## 2014-09-08 DIAGNOSIS — M791 Myalgia: Secondary | ICD-10-CM | POA: Insufficient documentation

## 2014-09-08 DIAGNOSIS — C569 Malignant neoplasm of unspecified ovary: Secondary | ICD-10-CM | POA: Diagnosis present

## 2014-09-08 DIAGNOSIS — Z5111 Encounter for antineoplastic chemotherapy: Secondary | ICD-10-CM | POA: Insufficient documentation

## 2014-09-08 DIAGNOSIS — F419 Anxiety disorder, unspecified: Secondary | ICD-10-CM | POA: Insufficient documentation

## 2014-09-08 DIAGNOSIS — G4700 Insomnia, unspecified: Secondary | ICD-10-CM | POA: Diagnosis not present

## 2014-09-08 DIAGNOSIS — K317 Polyp of stomach and duodenum: Secondary | ICD-10-CM | POA: Diagnosis not present

## 2014-09-08 DIAGNOSIS — K219 Gastro-esophageal reflux disease without esophagitis: Secondary | ICD-10-CM | POA: Diagnosis not present

## 2014-09-08 DIAGNOSIS — Z87891 Personal history of nicotine dependence: Secondary | ICD-10-CM | POA: Insufficient documentation

## 2014-09-08 DIAGNOSIS — Z888 Allergy status to other drugs, medicaments and biological substances status: Secondary | ICD-10-CM | POA: Insufficient documentation

## 2014-09-08 DIAGNOSIS — Z79899 Other long term (current) drug therapy: Secondary | ICD-10-CM | POA: Insufficient documentation

## 2014-09-08 DIAGNOSIS — Z418 Encounter for other procedures for purposes other than remedying health state: Secondary | ICD-10-CM | POA: Insufficient documentation

## 2014-09-08 DIAGNOSIS — Z800 Family history of malignant neoplasm of digestive organs: Secondary | ICD-10-CM | POA: Insufficient documentation

## 2014-09-08 LAB — CBC WITH DIFFERENTIAL/PLATELET
Basophils Absolute: 0 10*3/uL (ref 0–0.1)
Basophils Relative: 0 %
Eosinophils Absolute: 0 10*3/uL (ref 0–0.7)
Eosinophils Relative: 0 %
HEMATOCRIT: 36.3 % (ref 35.0–47.0)
Hemoglobin: 12 g/dL (ref 12.0–16.0)
Lymphocytes Relative: 13 %
Lymphs Abs: 1 10*3/uL (ref 1.0–3.6)
MCH: 33.5 pg (ref 26.0–34.0)
MCHC: 33.2 g/dL (ref 32.0–36.0)
MCV: 100.9 fL — AB (ref 80.0–100.0)
MONOS PCT: 4 %
Monocytes Absolute: 0.3 10*3/uL (ref 0.2–0.9)
NEUTROS ABS: 5.9 10*3/uL (ref 1.4–6.5)
NEUTROS PCT: 83 %
Platelets: 144 10*3/uL — ABNORMAL LOW (ref 150–440)
RBC: 3.59 MIL/uL — AB (ref 3.80–5.20)
RDW: 15.4 % — AB (ref 11.5–14.5)
WBC: 7.2 10*3/uL (ref 3.6–11.0)

## 2014-09-16 ENCOUNTER — Inpatient Hospital Stay (HOSPITAL_BASED_OUTPATIENT_CLINIC_OR_DEPARTMENT_OTHER): Payer: BLUE CROSS/BLUE SHIELD | Admitting: Hematology and Oncology

## 2014-09-16 ENCOUNTER — Inpatient Hospital Stay: Payer: BLUE CROSS/BLUE SHIELD

## 2014-09-16 ENCOUNTER — Encounter: Payer: Self-pay | Admitting: Hematology and Oncology

## 2014-09-16 ENCOUNTER — Ambulatory Visit: Payer: BLUE CROSS/BLUE SHIELD

## 2014-09-16 VITALS — BP 120/80 | HR 92 | Resp 16

## 2014-09-16 DIAGNOSIS — G4700 Insomnia, unspecified: Secondary | ICD-10-CM

## 2014-09-16 DIAGNOSIS — M791 Myalgia: Secondary | ICD-10-CM

## 2014-09-16 DIAGNOSIS — K219 Gastro-esophageal reflux disease without esophagitis: Secondary | ICD-10-CM | POA: Diagnosis not present

## 2014-09-16 DIAGNOSIS — C569 Malignant neoplasm of unspecified ovary: Secondary | ICD-10-CM

## 2014-09-16 DIAGNOSIS — F419 Anxiety disorder, unspecified: Secondary | ICD-10-CM

## 2014-09-16 DIAGNOSIS — K317 Polyp of stomach and duodenum: Secondary | ICD-10-CM

## 2014-09-16 DIAGNOSIS — Z79899 Other long term (current) drug therapy: Secondary | ICD-10-CM

## 2014-09-16 DIAGNOSIS — Z87891 Personal history of nicotine dependence: Secondary | ICD-10-CM

## 2014-09-16 DIAGNOSIS — Z888 Allergy status to other drugs, medicaments and biological substances status: Secondary | ICD-10-CM

## 2014-09-16 LAB — COMPREHENSIVE METABOLIC PANEL
ALT: 21 U/L (ref 14–54)
AST: 26 U/L (ref 15–41)
Albumin: 4.3 g/dL (ref 3.5–5.0)
Alkaline Phosphatase: 45 U/L (ref 38–126)
Anion gap: 7 (ref 5–15)
BUN: 13 mg/dL (ref 6–20)
CALCIUM: 9.3 mg/dL (ref 8.9–10.3)
CO2: 22 mmol/L (ref 22–32)
CREATININE: 0.56 mg/dL (ref 0.44–1.00)
Chloride: 109 mmol/L (ref 101–111)
GFR calc non Af Amer: >60 mL/min (ref >60)
Glucose, Bld: 112 mg/dL — ABNORMAL HIGH (ref 65–99)
Potassium: 3.7 mmol/L (ref 3.5–5.1)
Sodium: 138 mmol/L (ref 135–145)
Total Bilirubin: 0.6 mg/dL (ref 0.3–1.2)
Total Protein: 6.8 g/dL (ref 6.5–8.1)

## 2014-09-16 LAB — CBC WITH DIFFERENTIAL/PLATELET
Basophils Absolute: 0 10*3/uL (ref 0–0.1)
Basophils Relative: 0 %
Eosinophils Absolute: 0 10*3/uL (ref 0–0.7)
Eosinophils Relative: 0 %
HEMATOCRIT: 35.1 % (ref 35.0–47.0)
Hemoglobin: 11.8 g/dL — ABNORMAL LOW (ref 12.0–16.0)
Lymphocytes Relative: 9 %
Lymphs Abs: 0.6 10*3/uL — ABNORMAL LOW (ref 1.0–3.6)
MCH: 34.3 pg — ABNORMAL HIGH (ref 26.0–34.0)
MCHC: 33.7 g/dL (ref 32.0–36.0)
MCV: 101.9 fL — ABNORMAL HIGH (ref 80.0–100.0)
MONO ABS: 0.7 10*3/uL (ref 0.2–0.9)
Monocytes Relative: 10 %
NEUTROS ABS: 5.7 10*3/uL (ref 1.4–6.5)
Neutrophils Relative %: 81 %
PLATELETS: 209 10*3/uL (ref 150–440)
RBC: 3.44 MIL/uL — AB (ref 3.80–5.20)
RDW: 15.4 % — AB (ref 11.5–14.5)
WBC: 7.1 10*3/uL (ref 3.6–11.0)

## 2014-09-16 MED ORDER — SODIUM CHLORIDE 0.9 % IV SOLN
Freq: Once | INTRAVENOUS | Status: AC
  Administered 2014-09-16: 10:00:00 via INTRAVENOUS
  Filled 2014-09-16: qty 5

## 2014-09-16 MED ORDER — DIPHENHYDRAMINE HCL 50 MG/ML IJ SOLN
25.0000 mg | Freq: Once | INTRAMUSCULAR | Status: AC
  Administered 2014-09-16: 25 mg via INTRAVENOUS

## 2014-09-16 MED ORDER — DOCETAXEL CHEMO INJECTION 160 MG/16ML
70.0000 mg/m2 | Freq: Once | INTRAVENOUS | Status: AC
  Administered 2014-09-16 (×2): 110 mg via INTRAVENOUS
  Filled 2014-09-16: qty 11

## 2014-09-16 MED ORDER — CARBOPLATIN CHEMO INJECTION 600 MG/60ML
491.0000 mg | Freq: Once | INTRAVENOUS | Status: AC
  Administered 2014-09-16: 490 mg via INTRAVENOUS
  Filled 2014-09-16: qty 49

## 2014-09-16 MED ORDER — HEPARIN SOD (PORK) LOCK FLUSH 100 UNIT/ML IV SOLN
500.0000 [IU] | Freq: Once | INTRAVENOUS | Status: AC | PRN
  Administered 2014-09-16: 500 [IU]
  Filled 2014-09-16: qty 5

## 2014-09-16 MED ORDER — SODIUM CHLORIDE 0.9 % IJ SOLN
10.0000 mL | INTRAMUSCULAR | Status: DC | PRN
Start: 2014-09-16 — End: 2014-09-16
  Administered 2014-09-16: 10 mL
  Filled 2014-09-16: qty 10

## 2014-09-16 MED ORDER — SODIUM CHLORIDE 0.9 % IV SOLN
Freq: Once | INTRAVENOUS | Status: AC
  Administered 2014-09-16: 10:00:00 via INTRAVENOUS
  Filled 2014-09-16: qty 1000

## 2014-09-16 MED ORDER — DIPHENHYDRAMINE HCL 50 MG/ML IJ SOLN
INTRAMUSCULAR | Status: AC
  Filled 2014-09-16: qty 1

## 2014-09-16 MED ORDER — FAMOTIDINE IN NACL 20-0.9 MG/50ML-% IV SOLN
20.0000 mg | Freq: Once | INTRAVENOUS | Status: AC
  Administered 2014-09-16: 20 mg via INTRAVENOUS

## 2014-09-16 MED ORDER — PALONOSETRON HCL INJECTION 0.25 MG/5ML
0.2500 mg | Freq: Once | INTRAVENOUS | Status: AC
  Administered 2014-09-16: 0.25 mg via INTRAVENOUS
  Filled 2014-09-16: qty 5

## 2014-09-16 NOTE — Progress Notes (Signed)
[facility] day:  09/16/2014  Chief Complaint: Debbie Richardson is an 54 y.o. 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  HPI: The patient was last seen in the medical oncology clinic on 08/25/2014.  At that time,  she received cycle #5 carboplatin and Taxotere. She states that her chemotherapy is going well. She has had little nausea.  She now receives her Neulasta  48 hours after chemotherapy. She notes that she has poor sleep. Her muscles hurt "like a train hit me".  Symptoms typically last a day.  She denies any neuropathy.  She notes a little bit of pain in her left side since surgery. She describes the pain as annoying imprints he is level 2-4).  She does well with eating small meals.  Past Medical History  Diagnosis Date  . Dizziness     vertigo  . GERD (gastroesophageal reflux disease)   . Anxiety   . Gallstones   . Benign fundic gland polyps of stomach   . Arthritis   . Ovarian cancer     Past Surgical History  Procedure Laterality Date  . Cholecystectomy    . Cesarean section    . Laparoscopic oopherectomy    . Colonoscopy    . Upper gi endoscopy    . Oophorectomy    . Laparoscopic hysterectomy N/A 10/29/2014    Procedure: HYSTERECTOMY TOTAL LAPAROSCOPIC, OMENTUMECTOMY;  Surgeon: Gillis Ends, MD;  Location: [facility] ORS;  Service: Gynecology;  Laterality: N/A;    Family History  Problem Relation Age of Onset  . Hypertension Mother   . Hyperlipidemia Mother   . GER disease Mother   . Heart disease Paternal Grandmother   . Heart disease Father   . Breast cancer Paternal Grandmother   . Kidney disease Neg Hx   . Liver disease Neg Hx   . Colon cancer Neg Hx   . Colon polyps Neg Hx   . Esophageal cancer Neg Hx   . Pancreatic cancer Neg Hx     Social History:  reports that she has quit smoking. She has never used smokeless  tobacco. She reports that she drinks alcohol. She reports that she does not use illicit drugs.  The patient is accompanied by her daughter.  Allergies:  Allergies  Allergen Reactions  . Ciprofloxacin Anaphylaxis  . Paclitaxel Shortness Of Breath and Other (See Comments)    Other reaction(s): Tight chest (finding) Chest and facial flushing Flushing, chest tightness, SOB w/ Taxol on 05/26/14 at [place].    Current Medications: Current Outpatient Prescriptions  Medication Sig Dispense Refill  . loratadine (CLARITIN) 10 MG tablet Take 10 mg by mouth daily.    Marland Kitchen omeprazole (PRILOSEC) 40 MG capsule Take 1 capsule (40 mg total) by mouth daily before breakfast. 30 capsule 11  . acetaminophen (TYLENOL) 500 MG tablet Take 2 tablets (1,000 mg total) by mouth every 6 (six) hours. 30 tablet 0  . lidocaine (LIDODERM) 5 % Place 1 patch onto the skin daily. Remove & Discard patch within 12 hours or as directed by MD 30 patch 0   No current facility-administered medications for this visit.   Facility-Administered Medications Ordered in Other Visits  Medication Dose Route Frequency Provider Last Rate Last Dose  . sodium chloride 0.9 % injection 10 mL  10 mL Intracatheter PRN Forest Gleason, MD   10 mL at 08/04/14 1130  .  sodium chloride 0.9 % injection 10 mL  10 mL Intravenous PRN Forest Gleason, MD   10 mL at 11/19/14 1628    Review of Systems:  GENERAL:  Feels pretty good.  No fevers, sweats or weight loss. PERFORMANCE STATUS (ECOG):  1 HEENT:  No visual changes, runny nose, sore throat, mouth sores or tenderness. Lungs: No shortness of breath or cough.  No hemoptysis. Cardiac:  No chest pain, palpitations, orthopnea, or PND. GI:  Little nausea.  No vomiting, diarrhea, constipation, melena or hematochezia. GU:  No urgency, frequency, dysuria, or hematuria. Musculoskeletal:  No back pain.  No joint pain.  No muscle tenderness. Extremities:  No pain or swelling. Skin:  No rashes or skin  changes. Neuro:  No headache, numbness or weakness, balance or coordination issues. Endocrine:  No diabetes, thyroid issues, hot flashes or night sweats. Psych:  No mood changes, depression or anxiety. Pain:  No focal pain. Review of systems:  All other systems reviewed and found to be negative.   Physical Exam: Blood pressure 119/82, pulse 96, temperature 97.7 F (36.5 C), temperature source Tympanic, resp. rate 16, weight 129 lb 3 oz (58.6 kg), last menstrual period 03/30/2013. GENERAL:  Well developed, well nourished, sitting comfortably in the exam room in no acute distress. MENTAL STATUS:  Alert and oriented to person, place and time. HEAD:  Wearing a hat.  Alopecia.  Normocephalic, atraumatic, face symmetric, no Cushingoid features. EYES:  Glasses.  Pupils equal round and reactive to light and accomodation.  No conjunctivitis or scleral icterus. ENT:  Oropharynx clear without lesion.  Tongue normal. Mucous membranes moist.  RESPIRATORY:  Clear to auscultation without rales, wheezes or rhonchi. CARDIOVASCULAR:  Regular rate and rhythm without murmur, rub or gallop. ABDOMEN:  Soft, non-tender, with active bowel sounds, and no hepatosplenomegaly.  No masses. SKIN:  No rashes, ulcers or lesions. EXTREMITIES: No edema, no skin discoloration or tenderness.  No palpable cords. LYMPH NODES: No palpable cervical, supraclavicular, axillary or inguinal adenopathy  NEUROLOGICAL: Unremarkable. PSYCH:  Appropriate.  Office Visit on 09/16/2014  Component Date Value Ref Range Status  . Sodium 09/16/2014 138  135 - 145 mmol/L Final  . Potassium 09/16/2014 3.7  3.5 - 5.1 mmol/L Final  . Chloride 09/16/2014 109  101 - 111 mmol/L Final  . CO2 09/16/2014 22  22 - 32 mmol/L Final  . Glucose, Bld 09/16/2014 112* 65 - 99 mg/dL Final  . BUN 09/16/2014 13  6 - 20 mg/dL Final  . Creatinine, Ser 09/16/2014 0.56  0.44 - 1.00 mg/dL Final  . Calcium 09/16/2014 9.3  8.9 - 10.3 mg/dL Final  . Total Protein  09/16/2014 6.8  6.5 - 8.1 g/dL Final  . Albumin 09/16/2014 4.3  3.5 - 5.0 g/dL Final  . AST 09/16/2014 26  15 - 41 U/L Final  . ALT 09/16/2014 21  14 - 54 U/L Final  . Alkaline Phosphatase 09/16/2014 45  38 - 126 U/L Final  . Total Bilirubin 09/16/2014 0.6  0.3 - 1.2 mg/dL Final  . GFR calc non Af Amer 09/16/2014 >60  >60 mL/min Final  . GFR calc Af Amer 09/16/2014 >60  >60 mL/min Final   Comment: (NOTE) The eGFR has been calculated using the CKD EPI equation. This calculation has not been validated in all clinical situations. eGFR's persistently <60 mL/min signify possible Chronic Kidney Disease.   . Anion gap 09/16/2014 7  5 - 15 Final  . WBC 09/16/2014 7.1  3.6 - 11.0 K/uL Final  A-LINE DRAW  . RBC 09/16/2014 3.44* 3.80 - 5.20 MIL/uL Final  . Hemoglobin 09/16/2014 11.8* 12.0 - 16.0 g/dL Final  . HCT 09/16/2014 35.1  35.0 - 47.0 % Final  . MCV 09/16/2014 101.9* 80.0 - 100.0 fL Final  . MCH 09/16/2014 34.3* 26.0 - 34.0 pg Final  . MCHC 09/16/2014 33.7  32.0 - 36.0 g/dL Final  . RDW 09/16/2014 15.4* 11.5 - 14.5 % Final  . Platelets 09/16/2014 209  150 - 440 K/uL Final  . Neutrophils Relative % 09/16/2014 81   Final  . Neutro Abs 09/16/2014 5.7  1.4 - 6.5 K/uL Final  . Lymphocytes Relative 09/16/2014 9   Final  . Lymphs Abs 09/16/2014 0.6* 1.0 - 3.6 K/uL Final  . Monocytes Relative 09/16/2014 10   Final  . Monocytes Absolute 09/16/2014 0.7  0.2 - 0.9 K/uL Final  . Eosinophils Relative 09/16/2014 0   Final  . Eosinophils Absolute 09/16/2014 0.0  0 - 0.7 K/uL Final  . Basophils Relative 09/16/2014 0   Final  . Basophils Absolute 09/16/2014 0.0  0 - 0.1 K/uL Final    Assessment:  Debbie Richardson is an 54 y.o. 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  She experienced an allergic reaction to Taxol.  Desensitzation to Taxol was unsuccessful.  She has received 5 cycles of carboplatin and Taxotere (last 08/25/2014).  She is tolerating her chemotherapy well.  Exam is  unremarkable.  Plan: 1. Labs today:  CBC with diff, CMP. 2. Cycle # 6 carboplatin and Taxotere today. 3. RTC at least 24 hours after chemotherapy for Neulasta. 4. Chest/abdomen/pelvic CT scan in 2 1/2 weeks. 5. RTC in 3 weeks for MD (Dr. Oliva Bustard) assessment and review of CT scan.   Lequita Asal, MD  09/16/2014, 8:36 AM

## 2014-09-17 ENCOUNTER — Inpatient Hospital Stay: Payer: BLUE CROSS/BLUE SHIELD

## 2014-09-17 VITALS — BP 112/78 | HR 98 | Temp 98.0°F | Resp 18

## 2014-09-17 DIAGNOSIS — C569 Malignant neoplasm of unspecified ovary: Secondary | ICD-10-CM | POA: Diagnosis not present

## 2014-09-17 MED ORDER — PEGFILGRASTIM INJECTION 6 MG/0.6ML
6.0000 mg | Freq: Once | SUBCUTANEOUS | Status: AC
  Administered 2014-09-17: 6 mg via SUBCUTANEOUS
  Filled 2014-09-17: qty 0.6

## 2014-09-18 ENCOUNTER — Other Ambulatory Visit: Payer: BLUE CROSS/BLUE SHIELD

## 2014-09-18 ENCOUNTER — Ambulatory Visit: Payer: BLUE CROSS/BLUE SHIELD

## 2014-09-18 ENCOUNTER — Other Ambulatory Visit: Payer: Self-pay | Admitting: *Deleted

## 2014-09-18 ENCOUNTER — Ambulatory Visit: Payer: BLUE CROSS/BLUE SHIELD | Admitting: Family Medicine

## 2014-09-18 DIAGNOSIS — C569 Malignant neoplasm of unspecified ovary: Secondary | ICD-10-CM

## 2014-09-22 ENCOUNTER — Other Ambulatory Visit: Payer: BLUE CROSS/BLUE SHIELD

## 2014-09-22 ENCOUNTER — Ambulatory Visit: Payer: BLUE CROSS/BLUE SHIELD

## 2014-09-22 ENCOUNTER — Ambulatory Visit: Payer: BLUE CROSS/BLUE SHIELD | Admitting: Oncology

## 2014-09-23 ENCOUNTER — Inpatient Hospital Stay: Payer: BLUE CROSS/BLUE SHIELD

## 2014-09-23 DIAGNOSIS — C569 Malignant neoplasm of unspecified ovary: Secondary | ICD-10-CM | POA: Diagnosis not present

## 2014-09-23 LAB — CBC WITH DIFFERENTIAL/PLATELET
BASOS ABS: 0.1 10*3/uL (ref 0–0.1)
BASOS PCT: 1 %
Eosinophils Absolute: 0 10*3/uL (ref 0–0.7)
Eosinophils Relative: 0 %
HCT: 34 % — ABNORMAL LOW (ref 35.0–47.0)
Hemoglobin: 11.7 g/dL — ABNORMAL LOW (ref 12.0–16.0)
Lymphocytes Relative: 21 %
Lymphs Abs: 1.5 10*3/uL (ref 1.0–3.6)
MCH: 34.1 pg — AB (ref 26.0–34.0)
MCHC: 34.3 g/dL (ref 32.0–36.0)
MCV: 99.4 fL (ref 80.0–100.0)
Monocytes Absolute: 0.9 10*3/uL (ref 0.2–0.9)
Monocytes Relative: 13 %
Neutro Abs: 4.8 10*3/uL (ref 1.4–6.5)
Neutrophils Relative %: 65 %
PLATELETS: 127 10*3/uL — AB (ref 150–440)
RBC: 3.42 MIL/uL — ABNORMAL LOW (ref 3.80–5.20)
RDW: 14.6 % — AB (ref 11.5–14.5)
WBC: 7.3 10*3/uL (ref 3.6–11.0)

## 2014-09-25 ENCOUNTER — Telehealth: Payer: Self-pay | Admitting: *Deleted

## 2014-09-25 NOTE — Telephone Encounter (Signed)
Patient called with the following questions:  Is MD okay with her having scan next week?  Per Dr. Oliva Bustard - yes.  Is there anything she needs to do to prepare for scan?  There is no preparation unless she has been instructed.  Does she need to follow up with Dr. [name]?  Appointment will be scheduled and mailed to patient.  Called patient and left message to answer her questions.

## 2014-09-30 ENCOUNTER — Inpatient Hospital Stay: Payer: BLUE CROSS/BLUE SHIELD

## 2014-09-30 DIAGNOSIS — C569 Malignant neoplasm of unspecified ovary: Secondary | ICD-10-CM

## 2014-09-30 LAB — CBC WITH DIFFERENTIAL/PLATELET
Basophils Absolute: 0 10*3/uL (ref 0–0.1)
Basophils Relative: 0 %
EOS ABS: 0 10*3/uL (ref 0–0.7)
Eosinophils Relative: 0 %
HCT: 35.7 % (ref 35.0–47.0)
Hemoglobin: 11.8 g/dL — ABNORMAL LOW (ref 12.0–16.0)
Lymphocytes Relative: 13 %
Lymphs Abs: 1.1 10*3/uL (ref 1.0–3.6)
MCH: 34 pg (ref 26.0–34.0)
MCHC: 33.2 g/dL (ref 32.0–36.0)
MCV: 102.4 fL — ABNORMAL HIGH (ref 80.0–100.0)
Monocytes Absolute: 0.3 10*3/uL (ref 0.2–0.9)
Monocytes Relative: 4 %
Neutro Abs: 7.1 10*3/uL — ABNORMAL HIGH (ref 1.4–6.5)
Neutrophils Relative %: 83 %
PLATELETS: 152 10*3/uL (ref 150–440)
RBC: 3.48 MIL/uL — ABNORMAL LOW (ref 3.80–5.20)
RDW: 15.5 % — ABNORMAL HIGH (ref 11.5–14.5)
WBC: 8.5 10*3/uL (ref 3.6–11.0)

## 2014-10-02 ENCOUNTER — Ambulatory Visit
Admission: RE | Admit: 2014-10-02 | Discharge: 2014-10-02 | Disposition: A | Discharge status: Home / Self Care | Payer: BLUE CROSS/BLUE SHIELD | Source: Ambulatory Visit | Attending: Hematology and Oncology | Admitting: Hematology and Oncology

## 2014-10-02 ENCOUNTER — Ambulatory Visit: Admission: RE | Admit: 2014-10-02 | Payer: BLUE CROSS/BLUE SHIELD | Source: Ambulatory Visit

## 2014-10-02 DIAGNOSIS — C569 Malignant neoplasm of unspecified ovary: Secondary | ICD-10-CM | POA: Diagnosis not present

## 2014-10-02 DIAGNOSIS — K5730 Diverticulosis of large intestine without perforation or abscess without bleeding: Secondary | ICD-10-CM | POA: Diagnosis not present

## 2014-10-02 DIAGNOSIS — D259 Leiomyoma of uterus, unspecified: Secondary | ICD-10-CM | POA: Insufficient documentation

## 2014-10-02 MED ORDER — IOHEXOL 350 MG/ML SOLN
100.0000 mL | Freq: Once | INTRAVENOUS | Status: AC | PRN
  Administered 2014-10-02: 100 mL via INTRAVENOUS

## 2014-10-07 ENCOUNTER — Ambulatory Visit: Payer: BLUE CROSS/BLUE SHIELD | Admitting: Oncology

## 2014-10-08 ENCOUNTER — Inpatient Hospital Stay: Payer: BLUE CROSS/BLUE SHIELD | Attending: Oncology | Admitting: Oncology

## 2014-10-08 ENCOUNTER — Inpatient Hospital Stay: Payer: BLUE CROSS/BLUE SHIELD

## 2014-10-08 ENCOUNTER — Ambulatory Visit: Payer: BLUE CROSS/BLUE SHIELD | Admitting: Oncology

## 2014-10-08 VITALS — BP 113/78 | HR 101 | Temp 98.6°F | Wt 129.4 lb

## 2014-10-08 DIAGNOSIS — Z452 Encounter for adjustment and management of vascular access device: Secondary | ICD-10-CM | POA: Diagnosis not present

## 2014-10-08 DIAGNOSIS — Z87891 Personal history of nicotine dependence: Secondary | ICD-10-CM | POA: Diagnosis not present

## 2014-10-08 DIAGNOSIS — Z79899 Other long term (current) drug therapy: Secondary | ICD-10-CM | POA: Diagnosis not present

## 2014-10-08 DIAGNOSIS — Z888 Allergy status to other drugs, medicaments and biological substances status: Secondary | ICD-10-CM

## 2014-10-08 DIAGNOSIS — R5383 Other fatigue: Secondary | ICD-10-CM | POA: Diagnosis not present

## 2014-10-08 DIAGNOSIS — Z90722 Acquired absence of ovaries, bilateral: Secondary | ICD-10-CM | POA: Insufficient documentation

## 2014-10-08 DIAGNOSIS — K219 Gastro-esophageal reflux disease without esophagitis: Secondary | ICD-10-CM | POA: Diagnosis not present

## 2014-10-08 DIAGNOSIS — F419 Anxiety disorder, unspecified: Secondary | ICD-10-CM | POA: Diagnosis not present

## 2014-10-08 DIAGNOSIS — E876 Hypokalemia: Secondary | ICD-10-CM | POA: Insufficient documentation

## 2014-10-08 DIAGNOSIS — R109 Unspecified abdominal pain: Secondary | ICD-10-CM | POA: Diagnosis not present

## 2014-10-08 DIAGNOSIS — C569 Malignant neoplasm of unspecified ovary: Secondary | ICD-10-CM | POA: Insufficient documentation

## 2014-10-08 DIAGNOSIS — D259 Leiomyoma of uterus, unspecified: Secondary | ICD-10-CM | POA: Diagnosis not present

## 2014-10-08 DIAGNOSIS — M1990 Unspecified osteoarthritis, unspecified site: Secondary | ICD-10-CM | POA: Diagnosis not present

## 2014-10-08 DIAGNOSIS — R531 Weakness: Secondary | ICD-10-CM | POA: Insufficient documentation

## 2014-10-08 DIAGNOSIS — Z9221 Personal history of antineoplastic chemotherapy: Secondary | ICD-10-CM | POA: Diagnosis not present

## 2014-10-08 LAB — CBC WITH DIFFERENTIAL/PLATELET
Basophils Absolute: 0 10*3/uL (ref 0–0.1)
Basophils Relative: 1 %
EOS PCT: 1 %
Eosinophils Absolute: 0 10*3/uL (ref 0–0.7)
HEMATOCRIT: 37.5 % (ref 35.0–47.0)
Hemoglobin: 12.5 g/dL (ref 12.0–16.0)
LYMPHS ABS: 1.3 10*3/uL (ref 1.0–3.6)
Lymphocytes Relative: 24 %
MCH: 34.1 pg — ABNORMAL HIGH (ref 26.0–34.0)
MCHC: 33.4 g/dL (ref 32.0–36.0)
MCV: 102.1 fL — AB (ref 80.0–100.0)
MONO ABS: 0.5 10*3/uL (ref 0.2–0.9)
Monocytes Relative: 10 %
Neutro Abs: 3.5 10*3/uL (ref 1.4–6.5)
Neutrophils Relative %: 64 %
Platelets: 193 10*3/uL (ref 150–440)
RBC: 3.68 MIL/uL — ABNORMAL LOW (ref 3.80–5.20)
RDW: 15 % — AB (ref 11.5–14.5)
WBC: 5.3 10*3/uL (ref 3.6–11.0)

## 2014-10-08 LAB — COMPREHENSIVE METABOLIC PANEL
ALBUMIN: 4.5 g/dL (ref 3.5–5.0)
ALK PHOS: 45 U/L (ref 38–126)
ALT: 27 U/L (ref 14–54)
AST: 30 U/L (ref 15–41)
Anion gap: 9 (ref 5–15)
BILIRUBIN TOTAL: 0.7 mg/dL (ref 0.3–1.2)
BUN: 17 mg/dL (ref 6–20)
CHLORIDE: 105 mmol/L (ref 101–111)
CO2: 25 mmol/L (ref 22–32)
CREATININE: 0.82 mg/dL (ref 0.44–1.00)
Calcium: 8.8 mg/dL — ABNORMAL LOW (ref 8.9–10.3)
GFR calc Af Amer: >60 mL/min (ref >60)
Glucose, Bld: 88 mg/dL (ref 65–99)
POTASSIUM: 3.4 mmol/L — AB (ref 3.5–5.1)
Sodium: 139 mmol/L (ref 135–145)
Total Protein: 6.9 g/dL (ref 6.5–8.1)

## 2014-10-08 NOTE — Progress Notes (Signed)
Patient does have living will.  Former smoker. 

## 2014-10-09 ENCOUNTER — Encounter: Payer: Self-pay | Admitting: Oncology

## 2014-10-09 LAB — CA 125: CA 125: 18 U/mL (ref 0.0–38.1)

## 2014-10-09 NOTE — Progress Notes (Signed)
[place] @ [facility] Telephone:([phone]) [phone]  Fax:(336) [place] [facility]: 24-Jul-1960  MR#: [id number]  KCM#:[id number]  Patient Care Team: [name], MD as PCP - General  CHIEF COMPLAINT:  Chief Complaint  Patient presents with  . 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 patient had severe allergic reaction to Taxol.  Given during desensitizing protocol with Taxol patient broke out in hives.  Patient is here for initiation of chemotherapy with carboplatinum and Taxotere (June 02, 2014) 2.  Finished total 6 cycles of chemotherapy with carboplatinum and Taxotere June of 2016     Ovarian cancer   07/23/2014 Initial Diagnosis Ovarian cancer    Oncology Flowsheet 08/04/2014 08/05/2014 08/25/2014 08/27/2014 09/16/2014 09/16/2014 09/17/2014  Day, Cycle Day 1, Cycle 4 - Day 1, Cycle 5 - Day 1, Cycle 6   -  CARBOplatin (PARAPLATIN) IV 490 mg - 490 mg - 490 mg   -  dexamethasone (DECADRON) IV [ 12 mg ] - [ 12 mg ] - [ 12 mg ]   -  DOCEtaxel (TAXOTERE) IV 70 mg/m2 - 70 mg/m2 - 70 mg/m2 70 mg/m2 -  fosaprepitant (EMEND) IV [ 150 mg ] - [ 150 mg ] - [ 150 mg ]   -  palonosetron (ALOXI) IV 0.25 mg - 0.25 mg - 0.25 mg   -  pegfilgrastim (NEULASTA) [place] - 6 mg - 6 mg - - 6 mg    INTERVAL HISTORY: 54 year old lady came today with a history of stage III ovarian cancer.  This is the fifth cycle of chemotherapy.  Tolerating treatment very well.  No chills.  No fever.  No rash.  No tingling numbness.  Patient has intermittent abdominal pain. Patient is here for ongoing evaluation and treatment consideration.  Patient has finished total 6 cycles of chemotherapy tolerated treatment very well without any significant problem.  Patient had a rash after first chemotherapy with Taxotere.  With the help of Claritin and steroid rash resolved. A  repeat CT scan here to discuss the result and further planning of treatment  REVIEW OF SYSTEMS:   ROS  general status: Patient is feeling weak and tired.  No change in a performance status.  No chills.  No fever. HEENT: Alopecia.  No evidence of stomatitis Lungs: No cough or shortness of breath Cardiac: No chest pain or paroxysmal nocturnal dyspnea GI: No nausea no vomiting no diarrhea no abdominal pain Skin: No rash Lower extremity no swelling Neurological system: No tingling.  No numbness.  No other focal signs Musculoskeletal system no bony pains   PAST MEDICAL HISTORY: Past Medical History  Diagnosis Date  . Dizziness     vertigo  . GERD (gastroesophageal reflux disease)   . Anxiety   . Gallstones   . Benign fundic gland polyps of stomach   . Arthritis   . Ovarian cancer     PAST SURGICAL HISTORY: Past Surgical History  Procedure Laterality Date  . Cholecystectomy    . Cesarean section    . Laparoscopic oopherectomy    . Colonoscopy    . Upper gi endoscopy    . Oophorectomy      FAMILY HISTORY Family History  Problem Relation Age of Onset  . Hypertension Mother   . Hyperlipidemia Mother   . GER disease  Mother   . Heart disease Paternal Grandmother   . Heart disease Father   . Breast cancer Paternal Grandmother   . Kidney disease Neg Hx   . Liver disease Neg Hx   . Colon cancer Neg Hx   . Colon polyps Neg Hx   . Esophageal cancer Neg Hx   . Pancreatic cancer Neg Hx         ADVANCED DIRECTIVES: Does not have advance care directive.. Information has been given.  Appointment with chaplain has been discussed with the patient and family   HEALTH MAINTENANCE: History  Substance Use Topics  . Smoking status: Former Research scientist (life sciences)  . Smokeless tobacco: Never Used  . Alcohol Use: 0.0 oz/week    0 Standard drinks or equivalent per week     Comment: 1 drink daily      Allergies  Allergen Reactions  . Ciprofloxacin Anaphylaxis  . Paclitaxel Shortness Of  Breath and Other (See Comments)    Other reaction(s): Tight chest (finding) Chest and facial flushing Flushing, chest tightness, SOB w/ Taxol on 05/26/14 at [place].    Current Outpatient Prescriptions  Medication Sig Dispense Refill  . COMBIPATCH 0.05-0.25 MG/DAY   0  . dexamethasone (DECADRON) 4 MG tablet Take 1 tablet (4 mg total) by mouth 2 (two) times daily with a meal. Day before chemo and day after chemo 30 tablet 0  . estradiol-norethindrone (COMBIPATCH) 0.05-0.14 MG/DAY Place 1 patch onto the skin 2 (two) times a week. 8 patch 0  . hydrocortisone (ANUCORT-HC) 25 MG suppository Place 1 suppository (25 mg total) rectally 2 (two) times daily. 14 suppository 1  . ibuprofen (ADVIL,MOTRIN) 600 MG tablet Take 600 mg by mouth every 6 (six) hours as needed.    . lidocaine-prilocaine (EMLA) cream Apply 1 application topically as needed.    . loratadine (CLARITIN) 10 MG tablet Take 10 mg by mouth daily.    Marland Kitchen LORazepam (ATIVAN) 0.5 MG tablet Take 0.5 mg by mouth every 8 (eight) hours.    Marland Kitchen LORazepam (ATIVAN) 1 MG tablet Take 1 mg by mouth every 6 (six) hours as needed for anxiety.    Marland Kitchen omeprazole (PRILOSEC) 40 MG capsule Take 1 capsule (40 mg total) by mouth daily before breakfast. 30 capsule 11  . ondansetron (ZOFRAN) 4 MG tablet Take 1 tablet (4 mg total) by mouth every 6 (six) hours as needed for nausea or vomiting. 30 tablet 3  . potassium chloride (K-DUR) 10 MEQ tablet Take 20 mEq by mouth daily.     . ranitidine (ZANTAC) 150 MG tablet Take 300mg  by mouth daily at bedtime 60 tablet 3   No current facility-administered medications for this visit.   Facility-Administered Medications Ordered in Other Visits  Medication Dose Route Frequency Provider Last Rate Last Dose  . sodium chloride 0.9 % injection 10 mL  10 mL Intracatheter PRN Forest Gleason, MD   10 mL at 08/04/14 1130    OBJECTIVE: Filed Vitals:   10/08/14 1449  BP: 113/78  Pulse: 101  Temp: 98.6 F (37 C)     Body  mass index is 22.59 kg/(m^2).    ECOG FS:0 - Asymptomatic  Physical Exam GENERAL:  Well developed, well nourished, sitting comfortably in the exam room in no acute distress. MENTAL STATUS:  Alert and oriented to person, place and time. HEAD:  Alopecia  Normocephalic, atraumatic, face symmetric, no Cushingoid features. EYES:  .  Pupils equal round and reactive to light and accomodation.  No conjunctivitis or  scleral icterus. ENT:  Oropharynx clear without lesion.  Tongue normal. Mucous membranes moist.  RESPIRATORY:  Clear to auscultation without rales, wheezes or rhonchi. CARDIOVASCULAR:  Regular rate and rhythm without murmur, rub or gallop.  ABDOMEN:  Soft, non-tender, with active bowel sounds, and no hepatosplenomegaly.  No masses. BACK:  No CVA tenderness.  No tenderness on percussion of the back or rib cage. SKIN:  No rashes, ulcers or lesions. EXTREMITIES: No edema, no skin discoloration or tenderness.  No palpable cords. LYMPH NODES: No palpable cervical, supraclavicular, axillary or inguinal adenopathy  NEUROLOGICAL: Unremarkable. PSYCH:  Appropriate.  LAB RESULTS:     Component Value Date/Time   NA 139 10/08/2014 1543   NA 135 07/14/2014 0955   K 3.4* 10/08/2014 1543   K 3.2* 07/21/2014 1518   CL 105 10/08/2014 1543   CL 106 07/14/2014 0955   CO2 25 10/08/2014 1543   CO2 21* 07/14/2014 0955   GLUCOSE 88 10/08/2014 1543   GLUCOSE 138* 07/14/2014 0955   BUN 17 10/08/2014 1543   BUN 10 07/14/2014 0955   CREATININE 0.82 10/08/2014 1543   CREATININE 0.60 07/14/2014 0955   CALCIUM 8.8* 10/08/2014 1543   CALCIUM 9.1 07/14/2014 0955   PROT 6.9 10/08/2014 1543   PROT 7.1 07/14/2014 0955   ALBUMIN 4.5 10/08/2014 1543   ALBUMIN 4.4 07/14/2014 0955   AST 30 10/08/2014 1543   AST 24 07/14/2014 0955   ALT 27 10/08/2014 1543   ALT 20 07/14/2014 0955   ALKPHOS 45 10/08/2014 1543   ALKPHOS 45 07/14/2014 0955   BILITOT 0.7 10/08/2014 1543   GFRNONAA >60 10/08/2014 1543    GFRNONAA >60 07/14/2014 0955   GFRAA >60 10/08/2014 1543   GFRAA >60 07/14/2014 0955    No results found for: SPEP, UPEP  Lab Results  Component Value Date   WBC 5.3 10/08/2014   NEUTROABS 3.5 10/08/2014   HGB 12.5 10/08/2014   HCT 37.5 10/08/2014   MCV 102.1* 10/08/2014   PLT 193 10/08/2014      Chemistry      Component Value Date/Time   NA 139 10/08/2014 1543   NA 135 07/14/2014 0955   K 3.4* 10/08/2014 1543   K 3.2* 07/21/2014 1518   CL 105 10/08/2014 1543   CL 106 07/14/2014 0955   CO2 25 10/08/2014 1543   CO2 21* 07/14/2014 0955   BUN 17 10/08/2014 1543   BUN 10 07/14/2014 0955   CREATININE 0.82 10/08/2014 1543   CREATININE 0.60 07/14/2014 0955      Component Value Date/Time   CALCIUM 8.8* 10/08/2014 1543   CALCIUM 9.1 07/14/2014 0955   ALKPHOS 45 10/08/2014 1543   ALKPHOS 45 07/14/2014 0955   AST 30 10/08/2014 1543   AST 24 07/14/2014 0955   ALT 27 10/08/2014 1543   ALT 20 07/14/2014 0955   BILITOT 0.7 10/08/2014 1543            Component Value Date/Time   COLORURINE yellow 11/05/2008 1453   APPEARANCEUR Clear 11/05/2008 1453   LABSPEC <1.005 11/05/2008 1453   PHURINE 7.0 11/05/2008 1453   HGBUR moderate 11/05/2008 1453   BILIRUBINUR neg. 03/04/2014 1558   BILIRUBINUR negative 11/05/2008 1453   PROTEINUR neg. 03/04/2014 1558   UROBILINOGEN 0.2 03/04/2014 1558   UROBILINOGEN 0.2 11/05/2008 1453   NITRITE neg. 03/04/2014 1558   NITRITE negative 11/05/2008 1453   LEUKOCYTESUR Trace 03/04/2014 1558   Lab Results  Component Value Date   CA125 18.0 10/08/2014  ASSESSMENT: Carcinoma of ovary stage IIb status post initial surgery Patient had scrotal 6 cycles of chemotherapy with carboplatinum and Taxotere tolerated well. Repeat CT scan is essentially without any evidence of metastases. Hypokalemia CT scan has been reviewed independently  PLAN:   It is scan has been reviewed.  CA-125 is 18 which is stable.  Patient will be referred to  gynecologist for definitive surgery.  Depending on the surgical results further maintenance therapy would be discussed.  Patient expressed understanding and was in agreement with this plan. She also understands that She can call clinic at any time with any questions, concerns, or complaints.    Ovarian cancer   Staging form: Ovary, AJCC 7th Edition     Clinical stage from 07/23/2014: Stage Unknown (yT2c, NX, M0) - Signed by Evlyn Kanner, NP on 07/23/2014   Forest Gleason, MD   10/09/2014 8:44 AM

## 2014-10-13 ENCOUNTER — Encounter: Payer: Self-pay | Admitting: Oncology

## 2014-10-15 ENCOUNTER — Inpatient Hospital Stay: Payer: BLUE CROSS/BLUE SHIELD

## 2014-10-15 ENCOUNTER — Inpatient Hospital Stay (HOSPITAL_BASED_OUTPATIENT_CLINIC_OR_DEPARTMENT_OTHER): Payer: BLUE CROSS/BLUE SHIELD | Admitting: Obstetrics and Gynecology

## 2014-10-15 VITALS — BP 119/82 | HR 92 | Temp 98.4°F | Wt 129.9 lb

## 2014-10-15 DIAGNOSIS — Z90722 Acquired absence of ovaries, bilateral: Secondary | ICD-10-CM | POA: Diagnosis not present

## 2014-10-15 DIAGNOSIS — Z9221 Personal history of antineoplastic chemotherapy: Secondary | ICD-10-CM

## 2014-10-15 DIAGNOSIS — D259 Leiomyoma of uterus, unspecified: Secondary | ICD-10-CM | POA: Diagnosis not present

## 2014-10-15 DIAGNOSIS — C569 Malignant neoplasm of unspecified ovary: Secondary | ICD-10-CM

## 2014-10-15 DIAGNOSIS — M1990 Unspecified osteoarthritis, unspecified site: Secondary | ICD-10-CM

## 2014-10-15 DIAGNOSIS — Z79899 Other long term (current) drug therapy: Secondary | ICD-10-CM

## 2014-10-15 DIAGNOSIS — K219 Gastro-esophageal reflux disease without esophagitis: Secondary | ICD-10-CM

## 2014-10-15 DIAGNOSIS — C801 Malignant (primary) neoplasm, unspecified: Secondary | ICD-10-CM

## 2014-10-15 MED ORDER — HEPARIN SOD (PORK) LOCK FLUSH 100 UNIT/ML IV SOLN
500.0000 [IU] | Freq: Once | INTRAVENOUS | Status: AC
  Administered 2014-10-15: 500 [IU] via INTRAVENOUS

## 2014-10-15 MED ORDER — HEPARIN SOD (PORK) LOCK FLUSH 100 UNIT/ML IV SOLN
INTRAVENOUS | Status: AC
  Filled 2014-10-15: qty 5

## 2014-10-15 MED ORDER — SODIUM CHLORIDE 0.9 % IJ SOLN
10.0000 mL | Freq: Once | INTRAMUSCULAR | Status: AC
  Administered 2014-10-15: 10 mL via INTRAVENOUS
  Filled 2014-10-15: qty 10

## 2014-10-15 NOTE — Progress Notes (Signed)
Gynecologic Oncology Interval Note  Referring Provider: Dr. Forest Gleason, Dr. 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 woman who presents today for continued management of ovarian cancer.  She has completed chemotherapy and needs to be scheduled for completion surgery.  CT scan two weeks ago was negative. She feels fine and has no major complaints complaints.  She has some thigh weakness and fatigue.  No neuropathy.  CT IMPRESSION 10/02/14: 1. Interval bilateral salpingo-oophorectomy. No recurrent adnexal mass or evidence of metastatic disease in the chest, abdomen or pelvis. 2. Lobularity of the uterus with heterogeneous enhancement consistent with fibroids. 3. Mild distal colonic diverticulosis.  Oncology Treatment History:  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 Her history is as follows. She presented to the emergency room with acute pelvic pain in the left lower quadrant and suspected by ovarian torsion. Pelvic ultrasound on 04/26/2014 revealed multiple large septated cyst within both ovaries with mild thickening in septations. A uterine fibroid was noted in the uterine fundus measuring 2.5 cm. The uterus measured 7.9 x 4.2 x 4.7 cm. A CT scan was negative for any evidence of metastatic disease. CA125 was 45.  She was taken emergently to the operating room on 04/28/2014 by Dr. Glennon Mac and a laparoscopic procedure was performed.  Bilateral salpingo-oophorectomy with  placement of the cystic lesions in the Endo Catch bag and removal through the LLQ port. Per the operative note the right ovary did rupture and contents of the cysts were spilled into the pelvis. At the time of the surgery the upper abdominal exam was unremarkable and a cystoscopy was also performed which was negative. The final pathology revealed high-grade serous adenocarcinoma involving both ovaries and  fallopian tubes. Washings also revealed clusters of highly atypical glandular cells and surface involvement of the ovary was present.   She has received 6 cycles of carboplatin/taxane chemotherapy via an IV port, last treatment 4 weeks ago.  She had an allergy to taxol and was switched to taxotere for the last 4 cycles.    Genetic testing was negative.   Problem List: Patient Active Problem List   Diagnosis Date Noted  . Ovarian cancer 07/23/2014  . Change in bowel habits 03/04/2014  . Laceration of hand, right 08/19/2013  . Allergic conjunctivitis and rhinitis 07/04/2012  . GERD (gastroesophageal reflux disease) 05/01/2007  . PSORIASIS NEC 12/07/2006    Past Medical History: Past Medical History  Diagnosis Date  . Dizziness     vertigo  . GERD (gastroesophageal reflux disease)   . Anxiety   . Gallstones   . Benign fundic gland polyps of stomach   . Arthritis   . Ovarian cancer     Past Surgical History: Past Surgical History  Procedure Laterality Date  . Cholecystectomy    . Cesarean section    . Laparoscopic oopherectomy    . Colonoscopy    . Upper gi endoscopy    . Oophorectomy      Family History: Family History  Problem Relation Age of Onset  . Hypertension Mother   . Hyperlipidemia Mother   . GER disease Mother   . Heart disease Paternal Grandmother   . Heart disease Father   . Breast cancer Paternal Grandmother   . Kidney disease Neg Hx   . Liver disease Neg Hx   . Colon cancer Neg Hx   . Colon polyps Neg  Hx   . Esophageal cancer Neg Hx   . Pancreatic cancer Neg Hx     Social History: History   Social History  . Marital Status: Married    Spouse Name: N/A  . Number of Children: 1  . Years of Education: N/A   Occupational History  . Not on file.   Social History Main Topics  . Smoking status: Former Research scientist (life sciences)  . Smokeless tobacco: Never Used  . Alcohol Use: 0.0 oz/week    0 Standard drinks or equivalent per week     Comment: 1 drink daily   . Drug Use: No  . Sexual Activity: Not on file   Other Topics Concern  . Not on file   Social History Narrative    Allergies: Allergies  Allergen Reactions  . Ciprofloxacin Anaphylaxis  . Paclitaxel Shortness Of Breath and Other (See Comments)    Other reaction(s): Tight chest (finding) Chest and facial flushing Flushing, chest tightness, SOB w/ Taxol on 05/26/14 at [facility].    Current Medications: Current Outpatient Prescriptions  Medication Sig Dispense Refill  . COMBIPATCH 0.05-0.25 MG/DAY   0  . dexamethasone (DECADRON) 4 MG tablet Take 1 tablet (4 mg total) by mouth 2 (two) times daily with a meal. Day before chemo and day after chemo 30 tablet 0  . estradiol-norethindrone (COMBIPATCH) 0.05-0.14 MG/DAY Place 1 patch onto the skin 2 (two) times a week. 8 patch 0  . hydrocortisone (ANUCORT-HC) 25 MG suppository Place 1 suppository (25 mg total) rectally 2 (two) times daily. 14 suppository 1  . ibuprofen (ADVIL,MOTRIN) 600 MG tablet Take 600 mg by mouth every 6 (six) hours as needed.    . lidocaine-prilocaine (EMLA) cream Apply 1 application topically as needed.    . loratadine (CLARITIN) 10 MG tablet Take 10 mg by mouth daily.    Marland Kitchen LORazepam (ATIVAN) 0.5 MG tablet Take 0.5 mg by mouth every 8 (eight) hours.    Marland Kitchen LORazepam (ATIVAN) 1 MG tablet Take 1 mg by mouth every 6 (six) hours as needed for anxiety.    Marland Kitchen omeprazole (PRILOSEC) 40 MG capsule Take 1 capsule (40 mg total) by mouth daily before breakfast. 30 capsule 11  . ondansetron (ZOFRAN) 4 MG tablet Take 1 tablet (4 mg total) by mouth every 6 (six) hours as needed for nausea or vomiting. 30 tablet 3  . potassium chloride (K-DUR) 10 MEQ tablet Take 20 mEq by mouth daily.     . ranitidine (ZANTAC) 150 MG tablet Take 300mg  by mouth daily at bedtime 60 tablet 3   No current facility-administered medications for this visit.   Facility-Administered Medications Ordered in Other Visits  Medication Dose Route Frequency  Provider Last Rate Last Dose  . sodium chloride 0.9 % injection 10 mL  10 mL Intracatheter PRN Forest Gleason, MD   10 mL at 08/04/14 1130    Genetic Testing: negative   Review of Systems A comprehensive review of systems was negative.  Objective:  LMP 03/30/2013   ECOG Performance Status: 0 - Asymptomatic  General appearance: alert, cooperative and appears stated age HEENT:PERRLA, extra ocular movement intact, sclera clear, anicteric, neck supple with midline trachea and thyroid without masses Lymph node survey: non-palpable Chest: IV port in place in right upper chest. Cardiovascular: regular rate and rhythm Respiratory: normal air entry, lungs clear to auscultation Abdomen: soft, non-tender, without masses or organomegaly, no hernias and well healed incision Back: inspection of back is normal. Extremities: extremities normal, atraumatic, no cyanosis or  edema Skin exam - normal coloration and turgor, no rashes, no suspicious skin lesions noted. Neurological exam reveals alert, oriented, normal speech, no focal findings or movement disorder noted.  Pelvic: exam chaperoned by nurse;  Vulva: normal appearing vulva with no masses, tenderness or lesions; Vagina: normal vagina; Adnexa: nontender and no masses; Uterus: uterus is normal size, shape, consistency and nontender; Cervix: no lesions; Rectal: not indicated and normal rectal, no masses  Lab Review CA-125 pending. Lab Results  Component Value Date   CA125 18.0 10/08/2014   CA125 16.1 08/25/2014   CA125 16.5 07/14/2014    Assessment:  Debbie Richardson is a 54 y.o. female with a history of stage II at least high grade serous ovarian cancer. Status post LS BSO and 6 cycles of platin/taxane chemotherapy.  No evidence of disease on CT or exam and CA125 normal.  Genetic testing .  Plan:   Problem List Items Addressed This Visit    Ovarian cancer - Primary     She will undergo completion surgery with diagnostic laparoscopy,  TLH, omentectomy and biopsies on 10/29/14 with Dr [name] and Dr Glennon Mac to assist.   Risks including but not limited to:   Bleeding - possibly requiring transfusion  Infection- possibly requiring antibiotics, drain placement, and/or opening of incision  Damage to nearby organs: bowel, bladder, blood vessels, nerves, ureters.  Need for further surgery or re-exploration   Hernia or breakdown of incision  Separation of vaginal cuff  Delayed wound healing  Anesthesia risks  Medical complications: thromboembolic events (blood clot to lung, brain, legs), pneumonia, chronic pain, heart attack, stroke, death  Lymphedema or lymphocyst if lymph nodes removed    Suggested return to clinic postop.  She will call in the interval with any concerning symptoms.  She is comfortable with the plan and had her questions answered.    Mellody Drown, MD  CC:  [name], MD [address] [address] [place], [place] [address] [phone]

## 2014-10-16 ENCOUNTER — Telehealth: Payer: Self-pay | Admitting: *Deleted

## 2014-10-16 NOTE — Telephone Encounter (Signed)
Patient contacted. Discussed with patient that her preadmit testing will be on Monday, July 18 at 1430 in [place], [address].  Surgery date will be with Dr. [name] on October 29, 2014.  I am still waiting to confirm which [facility] provider will be assisting. Teach back process peformed.

## 2014-10-19 ENCOUNTER — Other Ambulatory Visit: Payer: Self-pay | Admitting: Oncology

## 2014-10-20 ENCOUNTER — Encounter
Admission: RE | Admit: 2014-10-20 | Discharge: 2014-10-20 | Disposition: A | Discharge status: Home / Self Care | Payer: BLUE CROSS/BLUE SHIELD | Source: Ambulatory Visit | Attending: Obstetrics and Gynecology | Admitting: Obstetrics and Gynecology

## 2014-10-20 DIAGNOSIS — Z01812 Encounter for preprocedural laboratory examination: Secondary | ICD-10-CM | POA: Insufficient documentation

## 2014-10-20 LAB — COMPREHENSIVE METABOLIC PANEL
ALBUMIN: 4.5 g/dL (ref 3.5–5.0)
ALK PHOS: 43 U/L (ref 38–126)
ALT: 15 U/L (ref 14–54)
ANION GAP: 9 (ref 5–15)
AST: 23 U/L (ref 15–41)
BUN: 12 mg/dL (ref 6–20)
CHLORIDE: 103 mmol/L (ref 101–111)
CO2: 28 mmol/L (ref 22–32)
Calcium: 9.1 mg/dL (ref 8.9–10.3)
Creatinine, Ser: 0.68 mg/dL (ref 0.44–1.00)
Glucose, Bld: 95 mg/dL (ref 65–99)
POTASSIUM: 3 mmol/L — AB (ref 3.5–5.1)
SODIUM: 140 mmol/L (ref 135–145)
Total Bilirubin: 0.9 mg/dL (ref 0.3–1.2)
Total Protein: 7 g/dL (ref 6.5–8.1)

## 2014-10-20 LAB — CBC WITH DIFFERENTIAL/PLATELET
BASOS PCT: 1 %
Basophils Absolute: 0 10*3/uL (ref 0–0.1)
EOS PCT: 3 %
Eosinophils Absolute: 0.1 10*3/uL (ref 0–0.7)
HEMATOCRIT: 37.3 % (ref 35.0–47.0)
HEMOGLOBIN: 12.9 g/dL (ref 12.0–16.0)
Lymphocytes Relative: 32 %
Lymphs Abs: 1.5 10*3/uL (ref 1.0–3.6)
MCH: 34.8 pg — ABNORMAL HIGH (ref 26.0–34.0)
MCHC: 34.5 g/dL (ref 32.0–36.0)
MCV: 100.6 fL — ABNORMAL HIGH (ref 80.0–100.0)
MONO ABS: 0.3 10*3/uL (ref 0.2–0.9)
Monocytes Relative: 7 %
NEUTROS ABS: 2.7 10*3/uL (ref 1.4–6.5)
NEUTROS PCT: 57 %
PLATELETS: 196 10*3/uL (ref 150–440)
RBC: 3.71 MIL/uL — ABNORMAL LOW (ref 3.80–5.20)
RDW: 14 % (ref 11.5–14.5)
WBC: 4.7 10*3/uL (ref 3.6–11.0)

## 2014-10-20 LAB — PROTIME-INR
INR: 1.1
Prothrombin Time: 14.4 seconds (ref 11.4–15.0)

## 2014-10-20 LAB — TYPE AND SCREEN
ABO/RH(D): A NEG
Antibody Screen: NEGATIVE

## 2014-10-20 LAB — APTT: aPTT: 28 seconds (ref 24–36)

## 2014-10-20 NOTE — Patient Instructions (Signed)
  Your procedure is scheduled on: Wednesday 10/29/2014 Report to Day Surgery. 2ND FLOOR MEDICAL MALL ENTRANCE To find out your arrival time please call [phone] between 1PM - 3PM on Tuesday 10/28/2014.  Remember: Instructions that are not followed completely may result in serious medical risk, up to and including death, or upon the discretion of your surgeon and anesthesiologist your surgery may need to be rescheduled.    __X__ 1. Do not eat food or drink liquids after midnight. No gum chewing or hard candies.     __X__ 2. No Alcohol for 24 hours before or after surgery.   ____ 3. Bring all medications with you on the day of surgery if instructed.    __X__ 4. Notify your doctor if there is any change in your medical condition     (cold, fever, infections).     Do not wear jewelry, make-up, hairpins, clips or nail polish.  Do not wear lotions, powders, or perfumes.   Do not shave 48 hours prior to surgery. Men may shave face and neck.  Do not bring valuables to the hospital.    [facility] is not responsible for any belongings or valuables.               Contacts, dentures or bridgework may not be worn into surgery.  Leave your suitcase in the car. After surgery it may be brought to your room.  For patients admitted to the hospital, discharge time is determined by your                treatment team.   Patients discharged the day of surgery will not be allowed to drive home.   Please read over the following fact sheets that you were given:   Surgical Site Infection Prevention   __X__ Take these medicines the morning of surgery with A SIP OF WATER:    1. OMEPRAZOLE  2.   3.   4.  5.  6.  ____ Fleet Enema (as directed)   __X__ Use CHG Soap as directed  ____ Use inhalers on the day of surgery  ____ Stop metformin 2 days prior to surgery    ____ Take 1/2 of usual insulin dose the night before surgery and none on the morning of surgery.   ____ Stop  Coumadin/Plavix/aspirin on   __X__ Stop Anti-inflammatories on  STOP IBUPROFEN UNTIL AFTER SURGERY   ____ Stop supplements until after surgery.    ____ Bring C-Pap to the hospital.

## 2014-10-21 ENCOUNTER — Other Ambulatory Visit: Payer: Self-pay | Admitting: *Deleted

## 2014-10-21 LAB — ABO/RH: ABO/RH(D): A NEG

## 2014-10-21 NOTE — Progress Notes (Signed)
Received message from Preadmit testing. Patient's potassium level is 3.0.  This serum potassium needs to increase in level before next Wednesday.  I spoke with Dr. Oliva Bustard. He recommended that the patient take Potassium 20 meq daily x 7 days. A redraw potassium has been ordered by anesthesia.  I contacted the patient and she has not taken her potassium in several weeks. The potassium makes my nauseated and I don't like to take it. Pt has potassium 10 meq at home. Pt instructed to take 2 tablets of potassium 10 meq to equal 20 meq. She was instructed to take the tablet with a full glass of water and applesauce to ease the gastric distress. Pt expressed verbal understanding. Teach back process performed.  Sherry in preadmit testing made aware of plan.

## 2014-10-22 ENCOUNTER — Ambulatory Visit: Payer: BLUE CROSS/BLUE SHIELD

## 2014-10-29 ENCOUNTER — Ambulatory Visit: Payer: BLUE CROSS/BLUE SHIELD | Admitting: Anesthesiology

## 2014-10-29 ENCOUNTER — Encounter
Admission: RE | Disposition: A | Discharge status: Home / Self Care | Payer: Self-pay | Source: Ambulatory Visit | Attending: Obstetrics and Gynecology

## 2014-10-29 ENCOUNTER — Encounter: Payer: Self-pay | Admitting: *Deleted

## 2014-10-29 ENCOUNTER — Observation Stay
Admission: RE | Admit: 2014-10-29 | Discharge: 2014-10-30 | Disposition: A | Discharge status: Home / Self Care | Payer: BLUE CROSS/BLUE SHIELD | Source: Ambulatory Visit | Attending: Obstetrics and Gynecology | Admitting: Obstetrics and Gynecology

## 2014-10-29 DIAGNOSIS — Z87892 Personal history of anaphylaxis: Secondary | ICD-10-CM | POA: Diagnosis not present

## 2014-10-29 DIAGNOSIS — Z90722 Acquired absence of ovaries, bilateral: Secondary | ICD-10-CM | POA: Diagnosis not present

## 2014-10-29 DIAGNOSIS — Z87891 Personal history of nicotine dependence: Secondary | ICD-10-CM | POA: Insufficient documentation

## 2014-10-29 DIAGNOSIS — Z881 Allergy status to other antibiotic agents status: Secondary | ICD-10-CM | POA: Diagnosis not present

## 2014-10-29 DIAGNOSIS — Z9889 Other specified postprocedural states: Secondary | ICD-10-CM | POA: Insufficient documentation

## 2014-10-29 DIAGNOSIS — K5730 Diverticulosis of large intestine without perforation or abscess without bleeding: Secondary | ICD-10-CM | POA: Diagnosis not present

## 2014-10-29 DIAGNOSIS — Z8489 Family history of other specified conditions: Secondary | ICD-10-CM | POA: Insufficient documentation

## 2014-10-29 DIAGNOSIS — Z7952 Long term (current) use of systemic steroids: Secondary | ICD-10-CM | POA: Diagnosis not present

## 2014-10-29 DIAGNOSIS — Z79899 Other long term (current) drug therapy: Secondary | ICD-10-CM | POA: Diagnosis not present

## 2014-10-29 DIAGNOSIS — N879 Dysplasia of cervix uteri, unspecified: Secondary | ICD-10-CM | POA: Diagnosis not present

## 2014-10-29 DIAGNOSIS — Z793 Long term (current) use of hormonal contraceptives: Secondary | ICD-10-CM | POA: Diagnosis not present

## 2014-10-29 DIAGNOSIS — K219 Gastro-esophageal reflux disease without esophagitis: Secondary | ICD-10-CM | POA: Insufficient documentation

## 2014-10-29 DIAGNOSIS — Z791 Long term (current) use of non-steroidal anti-inflammatories (NSAID): Secondary | ICD-10-CM | POA: Insufficient documentation

## 2014-10-29 DIAGNOSIS — Z888 Allergy status to other drugs, medicaments and biological substances status: Secondary | ICD-10-CM | POA: Insufficient documentation

## 2014-10-29 DIAGNOSIS — J309 Allergic rhinitis, unspecified: Secondary | ICD-10-CM | POA: Insufficient documentation

## 2014-10-29 DIAGNOSIS — Z9049 Acquired absence of other specified parts of digestive tract: Secondary | ICD-10-CM | POA: Diagnosis not present

## 2014-10-29 DIAGNOSIS — Z8249 Family history of ischemic heart disease and other diseases of the circulatory system: Secondary | ICD-10-CM | POA: Insufficient documentation

## 2014-10-29 DIAGNOSIS — Z9221 Personal history of antineoplastic chemotherapy: Secondary | ICD-10-CM | POA: Diagnosis not present

## 2014-10-29 DIAGNOSIS — F419 Anxiety disorder, unspecified: Secondary | ICD-10-CM | POA: Diagnosis not present

## 2014-10-29 DIAGNOSIS — Z803 Family history of malignant neoplasm of breast: Secondary | ICD-10-CM | POA: Insufficient documentation

## 2014-10-29 DIAGNOSIS — L409 Psoriasis, unspecified: Secondary | ICD-10-CM | POA: Insufficient documentation

## 2014-10-29 DIAGNOSIS — D251 Intramural leiomyoma of uterus: Secondary | ICD-10-CM | POA: Insufficient documentation

## 2014-10-29 DIAGNOSIS — M1990 Unspecified osteoarthritis, unspecified site: Secondary | ICD-10-CM | POA: Insufficient documentation

## 2014-10-29 DIAGNOSIS — Z90710 Acquired absence of both cervix and uterus: Secondary | ICD-10-CM | POA: Diagnosis present

## 2014-10-29 DIAGNOSIS — N809 Endometriosis, unspecified: Secondary | ICD-10-CM | POA: Diagnosis not present

## 2014-10-29 DIAGNOSIS — C569 Malignant neoplasm of unspecified ovary: Principal | ICD-10-CM | POA: Insufficient documentation

## 2014-10-29 HISTORY — PX: LAPAROSCOPIC HYSTERECTOMY: SHX1926

## 2014-10-29 LAB — URINALYSIS COMPLETE WITH MICROSCOPIC (ARMC ONLY)
BACTERIA UA: NONE SEEN
Bilirubin Urine: NEGATIVE
GLUCOSE, UA: NEGATIVE mg/dL
Leukocytes, UA: NEGATIVE
NITRITE: NEGATIVE
Protein, ur: 30 mg/dL — AB
Specific Gravity, Urine: 1.03 (ref 1.005–1.030)
pH: 5 (ref 5.0–8.0)

## 2014-10-29 LAB — GLUCOSE, CAPILLARY: GLUCOSE-CAPILLARY: 106 mg/dL — AB (ref 65–99)

## 2014-10-29 LAB — POTASSIUM: Potassium: 4.2 mmol/L (ref 3.5–5.1)

## 2014-10-29 SURGERY — HYSTERECTOMY, TOTAL, LAPAROSCOPIC
Anesthesia: General

## 2014-10-29 MED ORDER — OXYCODONE HCL 5 MG PO TABS: 10.0000 mg | ORAL_TABLET | ORAL | Status: DC | PRN

## 2014-10-29 MED ORDER — KETAMINE HCL 50 MG/ML IJ SOLN
INTRAMUSCULAR | Status: DC | PRN
  Administered 2014-10-29: 35 mg via INTRAMUSCULAR

## 2014-10-29 MED ORDER — MENTHOL 3 MG MT LOZG: 1.0000 | LOZENGE | OROMUCOSAL | Status: DC | PRN

## 2014-10-29 MED ORDER — CEFAZOLIN SODIUM-DEXTROSE 2-3 GM-% IV SOLR
INTRAVENOUS | Status: AC
  Filled 2014-10-29: qty 50

## 2014-10-29 MED ORDER — ACETAMINOPHEN 500 MG PO TABS
1000.0000 mg | ORAL_TABLET | Freq: Four times a day (QID) | ORAL | Status: DC
  Administered 2014-10-29 – 2014-10-30 (×3): 1000 mg via ORAL
  Filled 2014-10-29 (×3): qty 2

## 2014-10-29 MED ORDER — ONDANSETRON HCL 4 MG PO TABS: 4.0000 mg | ORAL_TABLET | Freq: Four times a day (QID) | ORAL | Status: DC | PRN

## 2014-10-29 MED ORDER — LACTATED RINGERS IV SOLN: INTRAVENOUS | Status: DC

## 2014-10-29 MED ORDER — ONDANSETRON HCL 4 MG/2ML IJ SOLN: 4.0000 mg | Freq: Four times a day (QID) | INTRAMUSCULAR | Status: DC | PRN

## 2014-10-29 MED ORDER — LACTATED RINGERS IV SOLN
INTRAVENOUS | Status: DC
  Administered 2014-10-29 (×2): via INTRAVENOUS

## 2014-10-29 MED ORDER — ONDANSETRON HCL 4 MG/2ML IJ SOLN
INTRAMUSCULAR | Status: DC | PRN
  Administered 2014-10-29: 4 mg via INTRAVENOUS

## 2014-10-29 MED ORDER — FENTANYL CITRATE (PF) 100 MCG/2ML IJ SOLN
INTRAMUSCULAR | Status: AC
  Administered 2014-10-29: 25 ug via INTRAVENOUS
  Filled 2014-10-29: qty 2

## 2014-10-29 MED ORDER — OXYCODONE HCL 5 MG PO TABS
5.0000 mg | ORAL_TABLET | ORAL | Status: DC | PRN
  Administered 2014-10-29: 5 mg via ORAL
  Filled 2014-10-29 (×2): qty 1

## 2014-10-29 MED ORDER — GLYCOPYRROLATE 0.2 MG/ML IJ SOLN
INTRAMUSCULAR | Status: DC | PRN
  Administered 2014-10-29: 0.6 mg via INTRAVENOUS

## 2014-10-29 MED ORDER — MIDAZOLAM HCL 2 MG/2ML IJ SOLN
INTRAMUSCULAR | Status: DC | PRN
  Administered 2014-10-29: 2 mg via INTRAVENOUS

## 2014-10-29 MED ORDER — ACETAMINOPHEN 10 MG/ML IV SOLN
INTRAVENOUS | Status: DC | PRN
  Administered 2014-10-29: 1000 mg via INTRAVENOUS

## 2014-10-29 MED ORDER — DEXAMETHASONE SODIUM PHOSPHATE 4 MG/ML IJ SOLN
INTRAMUSCULAR | Status: DC | PRN
  Administered 2014-10-29: 5 mg via INTRAVENOUS

## 2014-10-29 MED ORDER — LIDOCAINE HCL (CARDIAC) 20 MG/ML IV SOLN
INTRAVENOUS | Status: DC | PRN
  Administered 2014-10-29: 100 mg via INTRAVENOUS

## 2014-10-29 MED ORDER — PANTOPRAZOLE SODIUM 40 MG PO TBEC
40.0000 mg | DELAYED_RELEASE_TABLET | Freq: Every day | ORAL | Status: DC
  Administered 2014-10-30: 40 mg via ORAL
  Filled 2014-10-29: qty 1

## 2014-10-29 MED ORDER — CHLORHEXIDINE GLUCONATE 4 % EX LIQD: 1.0000 "application " | Freq: Once | CUTANEOUS | Status: DC

## 2014-10-29 MED ORDER — BUPIVACAINE HCL 0.5 % IJ SOLN
INTRAMUSCULAR | Status: DC | PRN
  Administered 2014-10-29: 9 mL

## 2014-10-29 MED ORDER — DOCUSATE SODIUM 100 MG PO CAPS
100.0000 mg | ORAL_CAPSULE | Freq: Two times a day (BID) | ORAL | Status: DC
  Administered 2014-10-29 – 2014-10-30 (×2): 100 mg via ORAL
  Filled 2014-10-29 (×2): qty 1

## 2014-10-29 MED ORDER — NEOSTIGMINE METHYLSULFATE 10 MG/10ML IV SOLN
INTRAVENOUS | Status: DC | PRN
  Administered 2014-10-29: 3.5 mg via INTRAVENOUS

## 2014-10-29 MED ORDER — ROCURONIUM BROMIDE 100 MG/10ML IV SOLN
INTRAVENOUS | Status: DC | PRN
  Administered 2014-10-29: 15 mg via INTRAVENOUS
  Administered 2014-10-29: 35 mg via INTRAVENOUS
  Administered 2014-10-29 (×2): 10 mg via INTRAVENOUS

## 2014-10-29 MED ORDER — SIMETHICONE 80 MG PO CHEW
80.0000 mg | CHEWABLE_TABLET | Freq: Four times a day (QID) | ORAL | Status: DC | PRN
  Administered 2014-10-29 – 2014-10-30 (×3): 80 mg via ORAL
  Filled 2014-10-29 (×3): qty 1

## 2014-10-29 MED ORDER — PROPOFOL 10 MG/ML IV BOLUS
INTRAVENOUS | Status: DC | PRN
  Administered 2014-10-29: 140 mg via INTRAVENOUS

## 2014-10-29 MED ORDER — ACETAMINOPHEN 10 MG/ML IV SOLN
INTRAVENOUS | Status: AC
  Filled 2014-10-29: qty 100

## 2014-10-29 MED ORDER — ONDANSETRON HCL 4 MG/2ML IJ SOLN: 4.0000 mg | Freq: Once | INTRAMUSCULAR | Status: DC | PRN

## 2014-10-29 MED ORDER — IBUPROFEN 600 MG PO TABS
600.0000 mg | ORAL_TABLET | Freq: Four times a day (QID) | ORAL | Status: DC | PRN
  Administered 2014-10-29 – 2014-10-30 (×3): 600 mg via ORAL
  Filled 2014-10-29 (×3): qty 1

## 2014-10-29 MED ORDER — FENTANYL CITRATE (PF) 100 MCG/2ML IJ SOLN
25.0000 ug | INTRAMUSCULAR | Status: DC | PRN
  Administered 2014-10-29 (×5): 25 ug via INTRAVENOUS

## 2014-10-29 MED ORDER — PHENYLEPHRINE HCL 10 MG/ML IJ SOLN
INTRAMUSCULAR | Status: DC | PRN
  Administered 2014-10-29 (×3): 200 ug via INTRAVENOUS

## 2014-10-29 MED ORDER — FLEET ENEMA 7-19 GM/118ML RE ENEM
1.0000 | ENEMA | Freq: Once | RECTAL | Status: AC
  Administered 2014-10-29: 1 via RECTAL

## 2014-10-29 MED ORDER — BUPIVACAINE HCL (PF) 0.5 % IJ SOLN
INTRAMUSCULAR | Status: AC
  Filled 2014-10-29: qty 30

## 2014-10-29 MED ORDER — CEFAZOLIN SODIUM-DEXTROSE 2-3 GM-% IV SOLR
2.0000 g | INTRAVENOUS | Status: AC
  Administered 2014-10-29: 2 g via INTRAVENOUS

## 2014-10-29 MED ORDER — FENTANYL CITRATE (PF) 100 MCG/2ML IJ SOLN
INTRAMUSCULAR | Status: DC | PRN
  Administered 2014-10-29: 50 ug via INTRAVENOUS
  Administered 2014-10-29 (×2): 100 ug via INTRAVENOUS
  Administered 2014-10-29: 150 ug via INTRAVENOUS
  Administered 2014-10-29: 100 ug via INTRAVENOUS

## 2014-10-29 SURGICAL SUPPLY — 73 items
BAG URO DRAIN 2000ML W/SPOUT (MISCELLANEOUS) ×2 IMPLANT
BLADE SURG SZ11 CARB STEEL (BLADE) ×2 IMPLANT
CANISTER SUCT 1200ML W/VALVE (MISCELLANEOUS) ×3 IMPLANT
CANNULA DILATOR 5 W/SLV (CANNULA) ×2 IMPLANT
CATH FOLEY 2WAY  5CC 16FR (CATHETERS) ×1
CATH FOLEY 2WAY 5CC 16FR (CATHETERS) ×1
CATH TRAY 16F METER LATEX (MISCELLANEOUS) ×2 IMPLANT
CATH URTH 16FR FL 2W BLN LF (CATHETERS) ×1 IMPLANT
CHLORAPREP W/TINT 26ML (MISCELLANEOUS) ×2 IMPLANT
CNTNR SPEC 2.5X3XGRAD LEK (MISCELLANEOUS)
CONT SPEC 4OZ STER OR WHT (MISCELLANEOUS)
CONT SPEC 4OZ STRL OR WHT (MISCELLANEOUS)
CONTAINER SPEC 2.5X3XGRAD LEK (MISCELLANEOUS) ×1 IMPLANT
DEVICE SUTURE ENDOST 10MM (ENDOMECHANICALS) ×1 IMPLANT
DRAPE LAPAROTOMY 100X77 ABD (DRAPES) ×2 IMPLANT
DRAPE UNDER BUTTOCK W/FLU (DRAPES) ×2 IMPLANT
DRESSING SURGICEL FIBRLLR 1X2 (HEMOSTASIS) IMPLANT
DRSG SURGICEL FIBRILLAR 1X2 (HEMOSTASIS) ×2
DRSG TELFA 3X8 NADH (GAUZE/BANDAGES/DRESSINGS) ×2 IMPLANT
ELECT BLADE 6 FLAT ULTRCLN (ELECTRODE) ×1 IMPLANT
ELECT CAUTERY BLADE 6.4 (BLADE) ×1 IMPLANT
FILTER LAP SMOKE EVAC STRL (MISCELLANEOUS) ×2 IMPLANT
GAUZE SPONGE 4X4 12PLY STRL (GAUZE/BANDAGES/DRESSINGS) ×2 IMPLANT
GLOVE BIO SURGEON STRL SZ 6.5 (GLOVE) ×17 IMPLANT
GLOVE INDICATOR 7.0 STRL GRN (GLOVE) ×8 IMPLANT
GOWN STRL REUS W/ TWL LRG LVL3 (GOWN DISPOSABLE) ×4 IMPLANT
GOWN STRL REUS W/TWL LRG LVL3 (GOWN DISPOSABLE) ×16
GRASPER ENDO ROTC 5X36 (INSTRUMENTS) ×1 IMPLANT
GRASPER SUT TROCAR 14GX15 (MISCELLANEOUS) ×1 IMPLANT
IRRIGATION STRYKERFLOW (MISCELLANEOUS) ×1 IMPLANT
IRRIGATOR STRYKERFLOW (MISCELLANEOUS) ×2
IV LACTATED RINGERS 1000ML (IV SOLUTION) ×2 IMPLANT
KIT RM TURNOVER CYSTO AR (KITS) ×2 IMPLANT
LABEL OR SOLS (LABEL) ×2 IMPLANT
LIGASURE BLUNT 5MM 37CM (INSTRUMENTS) ×1 IMPLANT
LIQUID BAND (GAUZE/BANDAGES/DRESSINGS) ×3 IMPLANT
MORCELLATOR XCISE  COR (MISCELLANEOUS)
MORCELLATOR XCISE COR (MISCELLANEOUS) IMPLANT
NDL INSUFF 14G 150MM VS150000 (NEEDLE) IMPLANT
NDL INSUFF ACCESS 14 VERSASTEP (NEEDLE) ×1 IMPLANT
NS IRRIG 1000ML POUR BTL (IV SOLUTION) ×2 IMPLANT
NS IRRIG 500ML POUR BTL (IV SOLUTION) ×2 IMPLANT
OCCLUDER COLPOPNEUMO (BALLOONS) ×1 IMPLANT
PACK BASIN MAJOR ARMC (MISCELLANEOUS) ×3 IMPLANT
PACK GYN LAPAROSCOPIC (MISCELLANEOUS) ×3 IMPLANT
PAD DRESSING TELFA 3X8 NADH (GAUZE/BANDAGES/DRESSINGS) ×1 IMPLANT
PAD GROUND ADULT SPLIT (MISCELLANEOUS) ×2 IMPLANT
PAD OB MATERNITY 4.3X12.25 (PERSONAL CARE ITEMS) ×2 IMPLANT
PAD PREP 24X41 OB/GYN DISP (PERSONAL CARE ITEMS) ×2 IMPLANT
POUCH ENDO CATCH II 15MM (MISCELLANEOUS) ×1 IMPLANT
SCISSORS METZENBAUM CVD 33 (INSTRUMENTS) ×1 IMPLANT
SHEARS HARMONIC ACE PLUS 36CM (ENDOMECHANICALS) ×2 IMPLANT
SLEEVE ENDOPATH XCEL 5M (ENDOMECHANICALS) ×2 IMPLANT
SPONGE LAP 18X18 5 PK (GAUZE/BANDAGES/DRESSINGS) ×1 IMPLANT
STAPLER SKIN PROX 35W (STAPLE) ×1 IMPLANT
STRAP SAFETY BODY (MISCELLANEOUS) ×1 IMPLANT
SUT MAXON ABS #0 GS21 30IN (SUTURE) ×1 IMPLANT
SUT MNCRL 3 0 RB1 (SUTURE) IMPLANT
SUT MONOCRYL 3 0 RB1 (SUTURE) ×2
SUT PDS AB 1 TP1 96 (SUTURE) ×1 IMPLANT
SUT VIC AB 0 CT1 27 (SUTURE)
SUT VIC AB 0 CT1 27XCR 8 STRN (SUTURE) ×2 IMPLANT
SUT VIC AB 0 CT1 36 (SUTURE) ×1 IMPLANT
SUT VICRYL 0 AB UR-6 (SUTURE) ×6 IMPLANT
SUT VICRYL AB 3-0 FS1 BRD 27IN (SUTURE) ×1 IMPLANT
SYR 50ML LL SCALE MARK (SYRINGE) ×1 IMPLANT
SYR BULB IRRIG 60ML STRL (SYRINGE) ×2 IMPLANT
SYRINGE 10CC LL (SYRINGE) ×2 IMPLANT
TROCAR 130MM GELPORT  DAV (MISCELLANEOUS) ×1 IMPLANT
TROCAR ENDO BLADELESS 11MM (ENDOMECHANICALS) ×1 IMPLANT
TROCAR VERSASTEP PLUS 12MM (TROCAR) ×1 IMPLANT
TROCAR XCEL NON-BLD 5MMX100MML (ENDOMECHANICALS) ×1 IMPLANT
TUBING INSUFFLATOR HEATED (MISCELLANEOUS) ×2 IMPLANT

## 2014-10-29 NOTE — Transfer of Care (Signed)
Immediate Anesthesia Transfer of Care Note  Patient: Debbie Richardson  Procedure(s) Performed: Procedure(s): HYSTERECTOMY TOTAL LAPAROSCOPIC, OMENTUMECTOMY (N/A)  Patient Location: PACU  Anesthesia Type:General  Level of Consciousness: awake, alert , oriented and patient cooperative  Airway & Oxygen Therapy: Patient Spontanous Breathing and Patient connected to nasal cannula oxygen  Post-op Assessment: Report given to RN and Post -op Vital signs reviewed and stable  Post vital signs: Reviewed and stable  Last Vitals:  Filed Vitals:   10/29/14 1439  BP: 114/79  Pulse:   Temp: 37.7 C  Resp: 14    Complications: No apparent anesthesia complications

## 2014-10-29 NOTE — Op Note (Addendum)
Operative Note   10/29/2014 2:29 PM  PRE-OP DIAGNOSIS: OVARIAN CANCER    POST-OP DIAGNOSIS: Same  SURGEON: Surgeon(s) and Role:    * Debbie Bonnet, MD  - Primary    *  Debbie Gaetana Michaelis, MD- Assisting  ANESTHESIA: General  PROCEDURE: Procedure(s): HYSTERECTOMY TOTAL LAPAROSCOPIC, INFRACOLIC OMENTECTOMY, AND WASHINGS  ESTIMATED BLOOD LOSS: less than 50 mL  DRAINS: NONE   SPECIMENS: UTERUS, CERVIX, LEFT IP LIGAMENT, OMENTUM, WASHINGS  COMPLICATIONS: NONE   DISPOSITION: PACU - hemodynamically stable.  CONDITION: stable  INDICATIONS: History of stage IIc ovarian cancer s/p chemotherapy for completion hysterectomy and omentectomy.   FINDINGS: Exam under anesthesia negative for masses. The uterus was small to palpation. On laparoscopic assessment the upper abdomen including omentum, stomach, liver, diaphragm and bowel were normal. There was no evidence of right para-aortic or pelvic adenopathy. The uterus was notable for anterior fibroid and nodularity on the posterior surface. The uterus was small, 4 cm, and retroverted. The cervix was very stenotic.   PROCEDURE IN DETAIL: After informed consent was obtained, the patient was taken to the operating room where anesthesia was obtained without difficulty. The patient was positioned in the dorsal lithotomy position in [place] and her arms were carefully tucked at her sides and the usual precautions were taken.  She was prepped and draped in normal sterile fashion.  Time-out was performed and a Foley catheter was placed into the bladder. The cervix was very stenotic and dilation was performed. The uterus was very small and perforation occurred.  A standard VCare uterine manipulator could not be placed because of the uterine size. Therefore the manipulator was sewn to the cervix with 0-vicryl at 6 and 12 o'clock to maintain its position and delineate the vaginal cuff laparoscopically.   An open Hasson technique was used to  place an infraumbilical 24-MW baloon trocar under direct visualization. The laparoscope was introduced and CO2 gas was infused for pneumoperitoneum to a pressure of 15 mm Hg.  Right and left lateral 5-mm ports and a 5-12 mm suprapubic port were placed under direct visualization of the laparoscope using an EndoStep technique.  Cytologic washings were obtained.  The patient was placed in Trendelenburg and the bowel was displaced up into the upper abdomen.  Round ligaments were divided on each side with the EndoShears and the retroperitoneal space was opened on the left.  The ureters were identified and preserved. The remainder of the left infundibulopelvic ligaments were skeletonized with care to avoid the ureter, sealed and divided with the LigaSure device. On the right the entire IP ligament has been removed. A bladder flap was created and the bladder was dissected down off the lower uterine segment and cervix using endoshears and electrocautery.  The uterine arteries were skeletonized bilaterally, sealed and divided with the LigaSure device.  A colpotomy was performed circumferentially along the V-Care ring with electrocautery and the cervix was incised from the vagina and the specimen was removed through the vagina.  A pneumo balloon was placed in the vagina.    Infracolic omentectomy was performed using the Ligasure. The specimen was placed in the 15 mm EndoCatch bag and removed through the vagina.   A pneumo balloon was placed in the vagina and the vaginal cuff was then closed in a running continuous fashion using the EndoStitch technique with 0 V-Lock suture with careful attention to include the vaginal cuff angles and the vaginal mucosa within the closure.   Hemostasis was observed. The intraperitoneal pressure was dropped,  and all planes of dissection, vascular pedicles and the vaginal cuff were found to be hemostatic.  The suprapubic trocar was removed and the fascia was closed with 0 Vicryl suture  using the Endoclose technique. The lateral trocars were removed under visualization.   Before the umbilical trocar was removed the CO2 gas was released.  The fascia there was closed with 0 Vicryl suture in interrupted technique.   The skin incision at the umbilicus was closed with subcuticular stitch.  The remaining skin incisions were closed with Indermil glue.  The patient tolerated the procedure well.  Sponge, lap and needle counts were correct x2.  The patient was taken to recovery room in excellent condition.  Antibiotics: Ancef given within one hour of incision, discontinued after one dose.   VTE prophylaxis: was ordered perioperatively.  Wound: clean contaminated   Gillis Ends, MD   I was present throughout this entire procedure and participated as the primary surgeon in every aspect of this surgery.  I have read through Dr. Gershon Crane account of the surgery and agree with the account in its entirety.  Debbie Bonnet, MD, [place] 10/29/2014 5:17 PM

## 2014-10-29 NOTE — Progress Notes (Signed)
Ok to start antibiotic on call per Gustavo Lah, RN Do not need to send sacral dressing to OR with patient per Gustavo Lah, RN

## 2014-10-29 NOTE — Interval H&P Note (Signed)
History and Physical Interval Note:  10/29/2014 10:14 AM  Debbie Richardson  has presented today for surgery, with the diagnosis of OVARIAN CANCER  The various methods of treatment have been discussed with the patient by Dr. Mellody Drown. After consideration of risks, benefits and other options for treatment, the patient has consented to  Procedure(s): LAPAROSCOPIC SUPRACERVICAL HYSTERECTOMY (N/A) PELVIC LYMPH NODE DISSECTION (N/A) as a surgical intervention .  The patient's history has been reviewed, patient examined, no change in status, stable for surgery.  I have reviewed the patient's chart and labs.  Questions were answered to the patient's satisfaction.     Devrin Monforte ALVAREZ

## 2014-10-29 NOTE — H&P (View-Only) (Signed)
Gynecologic Oncology Interval Note  Referring Provider: Dr. Forest Gleason, Dr. 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 woman who presents today for continued management of ovarian cancer.  She has completed chemotherapy and needs to be scheduled for completion surgery.  CT scan two weeks ago was negative. She feels fine and has no major complaints complaints.  She has some thigh weakness and fatigue.  No neuropathy.  CT IMPRESSION 10/02/14: 1. Interval bilateral salpingo-oophorectomy. No recurrent adnexal mass or evidence of metastatic disease in the chest, abdomen or pelvis. 2. Lobularity of the uterus with heterogeneous enhancement consistent with fibroids. 3. Mild distal colonic diverticulosis.  Oncology Treatment History:  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 Her history is as follows. She presented to the emergency room with acute pelvic pain in the left lower quadrant and suspected by ovarian torsion. Pelvic ultrasound on 04/26/2014 revealed multiple large septated cyst within both ovaries with mild thickening in septations. A uterine fibroid was noted in the uterine fundus measuring 2.5 cm. The uterus measured 7.9 x 4.2 x 4.7 cm. A CT scan was negative for any evidence of metastatic disease. CA125 was 45.  She was taken emergently to the operating room on 04/28/2014 by Dr. Glennon Mac and a laparoscopic procedure was performed.  Bilateral salpingo-oophorectomy with  placement of the cystic lesions in the Endo Catch bag and removal through the LLQ port. Per the operative note the right ovary did rupture and contents of the cysts were spilled into the pelvis. At the time of the surgery the upper abdominal exam was unremarkable and a cystoscopy was also performed which was negative. The final pathology revealed high-grade serous adenocarcinoma involving both ovaries and  fallopian tubes. Washings also revealed clusters of highly atypical glandular cells and surface involvement of the ovary was present.   She has received 6 cycles of carboplatin/taxane chemotherapy via an IV port, last treatment 4 weeks ago.  She had an allergy to taxol and was switched to taxotere for the last 4 cycles.    Genetic testing was negative.   Problem List: Patient Active Problem List   Diagnosis Date Noted  . Ovarian cancer 07/23/2014  . Change in bowel habits 03/04/2014  . Laceration of hand, right 08/19/2013  . Allergic conjunctivitis and rhinitis 07/04/2012  . GERD (gastroesophageal reflux disease) 05/01/2007  . PSORIASIS NEC 12/07/2006    Past Medical History: Past Medical History  Diagnosis Date  . Dizziness     vertigo  . GERD (gastroesophageal reflux disease)   . Anxiety   . Gallstones   . Benign fundic gland polyps of stomach   . Arthritis   . Ovarian cancer     Past Surgical History: Past Surgical History  Procedure Laterality Date  . Cholecystectomy    . Cesarean section    . Laparoscopic oopherectomy    . Colonoscopy    . Upper gi endoscopy    . Oophorectomy      Family History: Family History  Problem Relation Age of Onset  . Hypertension Mother   . Hyperlipidemia Mother   . GER disease Mother   . Heart disease Paternal Grandmother   . Heart disease Father   . Breast cancer Paternal Grandmother   . Kidney disease Neg Hx   . Liver disease Neg Hx   . Colon cancer Neg Hx   . Colon polyps Neg  Hx   . Esophageal cancer Neg Hx   . Pancreatic cancer Neg Hx     Social History: History   Social History  . Marital Status: Married    Spouse Name: N/A  . Number of Children: 1  . Years of Education: N/A   Occupational History  . Not on file.   Social History Main Topics  . Smoking status: Former Research scientist (life sciences)  . Smokeless tobacco: Never Used  . Alcohol Use: 0.0 oz/week    0 Standard drinks or equivalent per week     Comment: 1 drink daily   . Drug Use: No  . Sexual Activity: Not on file   Other Topics Concern  . Not on file   Social History Narrative    Allergies: Allergies  Allergen Reactions  . Ciprofloxacin Anaphylaxis  . Paclitaxel Shortness Of Breath and Other (See Comments)    Other reaction(s): Tight chest (finding) Chest and facial flushing Flushing, chest tightness, SOB w/ Taxol on 05/26/14 at [place].    Current Medications: Current Outpatient Prescriptions  Medication Sig Dispense Refill  . COMBIPATCH 0.05-0.25 MG/DAY   0  . dexamethasone (DECADRON) 4 MG tablet Take 1 tablet (4 mg total) by mouth 2 (two) times daily with a meal. Day before chemo and day after chemo 30 tablet 0  . estradiol-norethindrone (COMBIPATCH) 0.05-0.14 MG/DAY Place 1 patch onto the skin 2 (two) times a week. 8 patch 0  . hydrocortisone (ANUCORT-HC) 25 MG suppository Place 1 suppository (25 mg total) rectally 2 (two) times daily. 14 suppository 1  . ibuprofen (ADVIL,MOTRIN) 600 MG tablet Take 600 mg by mouth every 6 (six) hours as needed.    . lidocaine-prilocaine (EMLA) cream Apply 1 application topically as needed.    . loratadine (CLARITIN) 10 MG tablet Take 10 mg by mouth daily.    Marland Kitchen LORazepam (ATIVAN) 0.5 MG tablet Take 0.5 mg by mouth every 8 (eight) hours.    Marland Kitchen LORazepam (ATIVAN) 1 MG tablet Take 1 mg by mouth every 6 (six) hours as needed for anxiety.    Marland Kitchen omeprazole (PRILOSEC) 40 MG capsule Take 1 capsule (40 mg total) by mouth daily before breakfast. 30 capsule 11  . ondansetron (ZOFRAN) 4 MG tablet Take 1 tablet (4 mg total) by mouth every 6 (six) hours as needed for nausea or vomiting. 30 tablet 3  . potassium chloride (K-DUR) 10 MEQ tablet Take 20 mEq by mouth daily.     . ranitidine (ZANTAC) 150 MG tablet Take 300mg  by mouth daily at bedtime 60 tablet 3   No current facility-administered medications for this visit.   Facility-Administered Medications Ordered in Other Visits  Medication Dose Route Frequency  Provider Last Rate Last Dose  . sodium chloride 0.9 % injection 10 mL  10 mL Intracatheter PRN Forest Gleason, MD   10 mL at 08/04/14 1130    Genetic Testing: negative   Review of Systems A comprehensive review of systems was negative.  Objective:  LMP 03/30/2013   ECOG Performance Status: 0 - Asymptomatic  General appearance: alert, cooperative and appears stated age HEENT:PERRLA, extra ocular movement intact, sclera clear, anicteric, neck supple with midline trachea and thyroid without masses Lymph node survey: non-palpable Chest: IV port in place in right upper chest. Cardiovascular: regular rate and rhythm Respiratory: normal air entry, lungs clear to auscultation Abdomen: soft, non-tender, without masses or organomegaly, no hernias and well healed incision Back: inspection of back is normal. Extremities: extremities normal, atraumatic, no cyanosis or  edema Skin exam - normal coloration and turgor, no rashes, no suspicious skin lesions noted. Neurological exam reveals alert, oriented, normal speech, no focal findings or movement disorder noted.  Pelvic: exam chaperoned by nurse;  Vulva: normal appearing vulva with no masses, tenderness or lesions; Vagina: normal vagina; Adnexa: nontender and no masses; Uterus: uterus is normal size, shape, consistency and nontender; Cervix: no lesions; Rectal: not indicated and normal rectal, no masses  Lab Review CA-125 pending. Lab Results  Component Value Date   CA125 18.0 10/08/2014   CA125 16.1 08/25/2014   CA125 16.5 07/14/2014    Assessment:  Debbie Richardson is a 54 y.o. female with a history of stage II at least high grade serous ovarian cancer. Status post LS BSO and 6 cycles of platin/taxane chemotherapy.  No evidence of disease on CT or exam and CA125 normal.  Genetic testing .  Plan:   Problem List Items Addressed This Visit    Ovarian cancer - Primary     She will undergo completion surgery with diagnostic laparoscopy,  TLH, omentectomy and biopsies on 10/29/14 with Dr [name] and Dr Glennon Mac to assist.   Risks including but not limited to:   Bleeding - possibly requiring transfusion  Infection- possibly requiring antibiotics, drain placement, and/or opening of incision  Damage to nearby organs: bowel, bladder, blood vessels, nerves, ureters.  Need for further surgery or re-exploration   Hernia or breakdown of incision  Separation of vaginal cuff  Delayed wound healing  Anesthesia risks  Medical complications: thromboembolic events (blood clot to lung, brain, legs), pneumonia, chronic pain, heart attack, stroke, death  Lymphedema or lymphocyst if lymph nodes removed    Suggested return to clinic postop.  She will call in the interval with any concerning symptoms.  She is comfortable with the plan and had her questions answered.    Mellody Drown, MD  CC:  [name], MD [address] [place], [place] [address] [phone]

## 2014-10-29 NOTE — Anesthesia Preprocedure Evaluation (Signed)
Anesthesia Evaluation  Patient identified by MRN, date of birth, ID band Patient awake    Reviewed: Allergy & Precautions, NPO status , Patient's Chart, lab work & pertinent test results  History of Anesthesia Complications Negative for: history of anesthetic complications  Airway Mallampati: II       Dental  (+) Teeth Intact   Pulmonary former smoker,    Pulmonary exam normal       Cardiovascular negative cardio ROS Normal cardiovascular exam    Neuro/Psych    GI/Hepatic Neg liver ROS, GERD-  ,  Endo/Other  negative endocrine ROS  Renal/GU negative Renal ROS     Musculoskeletal   Abdominal   Peds negative pediatric ROS (+)  Hematology negative hematology ROS (+)   Anesthesia Other Findings   Reproductive/Obstetrics                             Anesthesia Physical Anesthesia Plan  ASA: II  Anesthesia Plan: General   Post-op Pain Management:    Induction: Intravenous  Airway Management Planned: Oral ETT  Additional Equipment:   Intra-op Plan:   Post-operative Plan: Extubation in OR  Informed Consent: I have reviewed the patients History and Physical, chart, labs and discussed the procedure including the risks, benefits and alternatives for the proposed anesthesia with the patient or authorized representative who has indicated his/her understanding and acceptance.     Plan Discussed with: CRNA  Anesthesia Plan Comments:         Anesthesia Quick Evaluation

## 2014-10-29 NOTE — Anesthesia Procedure Notes (Signed)
Procedure Name: Intubation Date/Time: 10/29/2014 11:30 AM Performed by: Rosaria Ferries, Dannya Pitkin Pre-anesthesia Checklist: Patient identified, Emergency Drugs available, Suction available and Patient being monitored Patient Re-evaluated:Patient Re-evaluated prior to inductionOxygen Delivery Method: Circle system utilized Preoxygenation: Pre-oxygenation with 100% oxygen Intubation Type: IV induction Laryngoscope Size: Mac and 3 Grade View: Grade I Tube type: Oral Tube size: 7.0 mm Number of attempts: 1

## 2014-10-30 DIAGNOSIS — C569 Malignant neoplasm of unspecified ovary: Secondary | ICD-10-CM | POA: Diagnosis not present

## 2014-10-30 LAB — CBC
HCT: 32.8 % — ABNORMAL LOW (ref 35.0–47.0)
Hemoglobin: 11.4 g/dL — ABNORMAL LOW (ref 12.0–16.0)
MCH: 34.5 pg — ABNORMAL HIGH (ref 26.0–34.0)
MCHC: 34.6 g/dL (ref 32.0–36.0)
MCV: 99.7 fL (ref 80.0–100.0)
PLATELETS: 127 10*3/uL — AB (ref 150–440)
RBC: 3.29 MIL/uL — ABNORMAL LOW (ref 3.80–5.20)
RDW: 13 % (ref 11.5–14.5)
WBC: 5.9 10*3/uL (ref 3.6–11.0)

## 2014-10-30 LAB — BASIC METABOLIC PANEL
Anion gap: 6 (ref 5–15)
BUN: 8 mg/dL (ref 6–20)
CO2: 26 mmol/L (ref 22–32)
Calcium: 8.4 mg/dL — ABNORMAL LOW (ref 8.9–10.3)
Chloride: 107 mmol/L (ref 101–111)
Creatinine, Ser: 0.57 mg/dL (ref 0.44–1.00)
GFR calc Af Amer: >60 mL/min (ref >60)
GFR calc non Af Amer: >60 mL/min (ref >60)
GLUCOSE: 93 mg/dL (ref 65–99)
Potassium: 3.8 mmol/L (ref 3.5–5.1)
Sodium: 139 mmol/L (ref 135–145)

## 2014-10-30 MED ORDER — IBUPROFEN 600 MG PO TABS: 600.0000 mg | ORAL_TABLET | Freq: Four times a day (QID) | ORAL | Status: DC | PRN

## 2014-10-30 MED ORDER — OXYCODONE HCL 5 MG PO TABS: 5.0000 mg | ORAL_TABLET | ORAL | Status: DC | PRN

## 2014-10-30 MED ORDER — ACETAMINOPHEN 500 MG PO TABS: 1000.0000 mg | ORAL_TABLET | Freq: Four times a day (QID) | ORAL | Status: DC

## 2014-10-30 NOTE — Discharge Summary (Signed)
DC Summary Discharge Summary   Patient ID: Debbie Richardson [id number] 54 y.o. Jun 19, 1960  Admit date: 10/29/2014  Discharge date: 10/30/2014  Principal Diagnoses:  Ovarian cancer  Secondary Diagnoses:  none  Procedures performed during the hospitalization:  Total laparoscopic hysterectomy, omentectomy  HPI: the patient is a 54 y.o. female who has a history of at least stage IIC ovarian cancer diagnosed in January of this year, who is status post BSO and now s/p 6 cycles of chemotherapy.  She presents for completion surgery.  Past Medical History  Diagnosis Date  . Dizziness     vertigo  . GERD (gastroesophageal reflux disease)   . Anxiety   . Gallstones   . Benign fundic gland polyps of stomach   . Arthritis   . Ovarian cancer     Past Surgical History  Procedure Laterality Date  . Cholecystectomy    . Cesarean section    . Laparoscopic oopherectomy    . Colonoscopy    . Upper gi endoscopy    . Oophorectomy    . Laparoscopic hysterectomy N/A 10/29/2014    Procedure: HYSTERECTOMY TOTAL LAPAROSCOPIC, OMENTUMECTOMY;  Surgeon: Gillis Ends, MD;  Location: [facility] ORS;  Service: Gynecology;  Laterality: N/A;    Allergies  Allergen Reactions  . Ciprofloxacin Anaphylaxis  . Paclitaxel Shortness Of Breath and Other (See Comments)    Other reaction(s): Tight chest (finding) Chest and facial flushing Flushing, chest tightness, SOB w/ Taxol on 05/26/14 at [place].    History  Substance Use Topics  . Smoking status: Former Research scientist (life sciences)  . Smokeless tobacco: Never Used  . Alcohol Use: 0.0 oz/week    0 Standard drinks or equivalent per week     Comment: 1 drink daily    Family History  Problem Relation Age of Onset  . Hypertension Mother   . Hyperlipidemia Mother   . GER disease Mother   . Heart disease Paternal Grandmother   . Heart disease Father   . Breast cancer Paternal Grandmother   . Kidney disease Neg Hx   . Liver disease Neg Hx   . Colon  cancer Neg Hx   . Colon polyps Neg Hx   . Esophageal cancer Neg Hx   . Pancreatic cancer Neg Hx     Hospital Course:  Admitted for the above surgery, which occurred without difficulty. She had a routine post-operative course and was ambulating, tolerating a PO diet, voiding spontaneously, and her pain is well controlled on PO pain medication.  Throughout her hospital course her vitals were normal and stable and her labs were appropriate on postop day #1.  Discharge Exam: BP 111/68 mmHg  Pulse 88  Temp(Src) 98.1 F (36.7 C) (Oral)  Resp 18  Ht 5\' 2"  (1.575 m)  Wt 129 lb (58.514 kg)  BMI 23.59 kg/m2  SpO2 99%  LMP 03/30/2013 General  no apparent distress   CV  RRR   Pulmonary  clear to ausculatation bllaterally   Abdomen  Bowel sounds: present  Incision: clean, dry, intact   Extremities  no edema, symmetric, SCDs in place    Condition at Discharge: Stable  Complications affecting treatment: None  Discharge Medications:    Medication List    STOP taking these medications        COMBIPATCH 0.05-0.14 MG/DAY  Generic drug:  estradiol-norethindrone     COMBIPATCH 0.05-0.25 MG/DAY  Generic drug:  estradiol-norethindrone     dexamethasone 4 MG tablet  Commonly known as:  DECADRON  LORazepam 0.5 MG tablet  Commonly known as:  ATIVAN     LORazepam 1 MG tablet  Commonly known as:  ATIVAN     ondansetron 4 MG tablet  Commonly known as:  ZOFRAN     ranitidine 150 MG tablet  Commonly known as:  ZANTAC      TAKE these medications        acetaminophen 500 MG tablet  Commonly known as:  TYLENOL  Take 2 tablets (1,000 mg total) by mouth every 6 (six) hours.     ibuprofen 600 MG tablet  Commonly known as:  ADVIL,MOTRIN  Take 1 tablet (600 mg total) by mouth every 6 (six) hours as needed (mild pain).     lidocaine-prilocaine cream  Commonly known as:  EMLA  Apply 1 application topically as needed.     loratadine 10 MG tablet  Commonly known as:  CLARITIN  Take  10 mg by mouth daily.     omeprazole 40 MG capsule  Commonly known as:  PRILOSEC  Take 1 capsule (40 mg total) by mouth daily before breakfast.     oxyCODONE 5 MG immediate release tablet  Commonly known as:  Oxy IR/ROXICODONE  Take 1 tablet (5 mg total) by mouth every 4 (four) hours as needed for moderate pain or severe pain (pain 4-7 out of 10).     potassium chloride 10 MEQ tablet  Commonly known as:  K-DUR  Take 20 mEq by mouth daily.        Follow-up arrangements:  Follow-up Information    Follow up with Gillis Ends, MD. Schedule an appointment as soon as possible for a visit in 6 weeks.   Specialty:  Obstetrics and Gynecology   Why:  post op check at [facility] (not [facility] in [place])   [place] information:   [facility] [address] [address] [phone]       Discharge Disposition: Home in stable condition.  Signed: Will Bonnet, MD 10/30/2014 7:35 AM

## 2014-10-30 NOTE — Progress Notes (Signed)
pt discharged homept discharged home with infant.  Discharge instructions, prescriptions and follow up appointment given to and reviewed with pt.  Pt verbalized understanding, all questions answered.  Escorted by auxiliary.Marland Kitchen  Discharge instructions, prescriptions and follow up appointment given to and reviewed with pt.  Pt verbalized understanding, all questions answered.  Escorted by auxiliary.

## 2014-10-31 LAB — SURGICAL PATHOLOGY

## 2014-10-31 LAB — CYTOLOGY - NON PAP

## 2014-10-31 NOTE — Anesthesia Postprocedure Evaluation (Signed)
  Anesthesia Post-op Note  Patient: Debbie Richardson  Procedure(s) Performed: Procedure(s): HYSTERECTOMY TOTAL LAPAROSCOPIC, OMENTUMECTOMY (N/A)  Anesthesia type:General  Patient location: PACU  Post pain: Pain level controlled  Post assessment: Post-op Vital signs reviewed, Patient's Cardiovascular Status Stable, Respiratory Function Stable, Patent Airway and No signs of Nausea or vomiting  Post vital signs: Reviewed and stable  Last Vitals:  Filed Vitals:   10/30/14 1100  BP: 122/77  Pulse: 90  Temp: 36.8 C  Resp: 18    Level of consciousness: awake, alert  and patient cooperative  Complications: No apparent anesthesia complications

## 2014-11-03 ENCOUNTER — Telehealth: Payer: Self-pay | Admitting: *Deleted

## 2014-11-03 NOTE — Telephone Encounter (Signed)
Received msg from patient. Pt wants to know if she needs to see the gyn sooner than her 6 week post op appointment. She states that her incision site is a little swollen. No signs of infection.  Attempted to call patient back at 1328. No need to see gyn oncologist any sooner than scheduled in Sept. However, if patient continues to have problems or concerns, she can see Dr. Glennon Mac or [name] or [facility] if necessary.

## 2014-11-05 ENCOUNTER — Telehealth: Payer: Self-pay | Admitting: *Deleted

## 2014-11-05 NOTE — Telephone Encounter (Signed)
Patient called. Would like RN to review pathology.  Discussed with patient that the final pathology did not demonstrate any signs of cancer. Pathology did demonstrate a leiomyoma and endometriosis. Patient states that she is grateful for the information.

## 2014-11-10 ENCOUNTER — Telehealth: Payer: Self-pay | Admitting: *Deleted

## 2014-11-10 NOTE — Telephone Encounter (Signed)
I spoke with Dr. Fransisca Connors via email and he was reassured as pt denied fever and redness to site. He stated that pt could be seen between his OR procedures if need be... I reassured Ms. Faerber that she was in the healing phase, but to give a call back tomorrow should the incision become tender and red; or if she should develop a fever. Pt was very agreeable to this.Marland KitchenMarland Kitchen

## 2014-11-10 NOTE — Telephone Encounter (Signed)
Pt reports that she noticed some puffiness and swelling at the incision in her belly button; the last few days. Denies having a fever... 1510~I returned pt's call and informed her that I was waiting to hear back from Dr. Fransisca Connors; and that would I give her a call back soon.Marland KitchenMarland Kitchen

## 2014-11-19 ENCOUNTER — Inpatient Hospital Stay (HOSPITAL_BASED_OUTPATIENT_CLINIC_OR_DEPARTMENT_OTHER): Payer: BLUE CROSS/BLUE SHIELD | Admitting: Oncology

## 2014-11-19 ENCOUNTER — Inpatient Hospital Stay: Payer: BLUE CROSS/BLUE SHIELD | Attending: Oncology

## 2014-11-19 ENCOUNTER — Encounter: Payer: Self-pay | Admitting: *Deleted

## 2014-11-19 ENCOUNTER — Inpatient Hospital Stay: Payer: BLUE CROSS/BLUE SHIELD

## 2014-11-19 VITALS — BP 113/78 | HR 105 | Temp 98.1°F | Wt 124.2 lb

## 2014-11-19 DIAGNOSIS — R630 Anorexia: Secondary | ICD-10-CM | POA: Diagnosis not present

## 2014-11-19 DIAGNOSIS — Z9221 Personal history of antineoplastic chemotherapy: Secondary | ICD-10-CM | POA: Diagnosis not present

## 2014-11-19 DIAGNOSIS — F419 Anxiety disorder, unspecified: Secondary | ICD-10-CM | POA: Insufficient documentation

## 2014-11-19 DIAGNOSIS — Z888 Allergy status to other drugs, medicaments and biological substances status: Secondary | ICD-10-CM | POA: Insufficient documentation

## 2014-11-19 DIAGNOSIS — K219 Gastro-esophageal reflux disease without esophagitis: Secondary | ICD-10-CM

## 2014-11-19 DIAGNOSIS — Z9889 Other specified postprocedural states: Secondary | ICD-10-CM | POA: Diagnosis not present

## 2014-11-19 DIAGNOSIS — M1990 Unspecified osteoarthritis, unspecified site: Secondary | ICD-10-CM | POA: Insufficient documentation

## 2014-11-19 DIAGNOSIS — Z90722 Acquired absence of ovaries, bilateral: Secondary | ICD-10-CM | POA: Diagnosis not present

## 2014-11-19 DIAGNOSIS — C569 Malignant neoplasm of unspecified ovary: Secondary | ICD-10-CM | POA: Diagnosis not present

## 2014-11-19 DIAGNOSIS — R5383 Other fatigue: Secondary | ICD-10-CM | POA: Insufficient documentation

## 2014-11-19 DIAGNOSIS — R531 Weakness: Secondary | ICD-10-CM

## 2014-11-19 DIAGNOSIS — Z79899 Other long term (current) drug therapy: Secondary | ICD-10-CM | POA: Diagnosis not present

## 2014-11-19 DIAGNOSIS — R109 Unspecified abdominal pain: Secondary | ICD-10-CM | POA: Diagnosis not present

## 2014-11-19 DIAGNOSIS — C801 Malignant (primary) neoplasm, unspecified: Secondary | ICD-10-CM

## 2014-11-19 DIAGNOSIS — K5730 Diverticulosis of large intestine without perforation or abscess without bleeding: Secondary | ICD-10-CM | POA: Diagnosis not present

## 2014-11-19 LAB — COMPREHENSIVE METABOLIC PANEL
ALT: 18 U/L (ref 14–54)
ANION GAP: 9 (ref 5–15)
AST: 22 U/L (ref 15–41)
Albumin: 4.9 g/dL (ref 3.5–5.0)
Alkaline Phosphatase: 41 U/L (ref 38–126)
BUN: 12 mg/dL (ref 6–20)
CO2: 25 mmol/L (ref 22–32)
Calcium: 8.9 mg/dL (ref 8.9–10.3)
Chloride: 99 mmol/L — ABNORMAL LOW (ref 101–111)
Creatinine, Ser: 0.67 mg/dL (ref 0.44–1.00)
GFR calc Af Amer: >60 mL/min (ref >60)
GFR calc non Af Amer: >60 mL/min (ref >60)
Glucose, Bld: 86 mg/dL (ref 65–99)
POTASSIUM: 3.2 mmol/L — AB (ref 3.5–5.1)
SODIUM: 133 mmol/L — AB (ref 135–145)
Total Bilirubin: 1 mg/dL (ref 0.3–1.2)
Total Protein: 7.4 g/dL (ref 6.5–8.1)

## 2014-11-19 LAB — CBC WITH DIFFERENTIAL/PLATELET
BASOS PCT: 1 %
Basophils Absolute: 0 10*3/uL (ref 0–0.1)
EOS ABS: 0.1 10*3/uL (ref 0–0.7)
Eosinophils Relative: 2 %
HCT: 35.6 % (ref 35.0–47.0)
Hemoglobin: 12.3 g/dL (ref 12.0–16.0)
Lymphocytes Relative: 33 %
Lymphs Abs: 1.6 10*3/uL (ref 1.0–3.6)
MCH: 33.2 pg (ref 26.0–34.0)
MCHC: 34.6 g/dL (ref 32.0–36.0)
MCV: 95.8 fL (ref 80.0–100.0)
MONOS PCT: 6 %
Monocytes Absolute: 0.3 10*3/uL (ref 0.2–0.9)
NEUTROS ABS: 2.9 10*3/uL (ref 1.4–6.5)
NEUTROS PCT: 58 %
Platelets: 194 10*3/uL (ref 150–440)
RBC: 3.71 MIL/uL — AB (ref 3.80–5.20)
RDW: 12.2 % (ref 11.5–14.5)
WBC: 4.9 10*3/uL (ref 3.6–11.0)

## 2014-11-19 MED ORDER — HEPARIN SOD (PORK) LOCK FLUSH 100 UNIT/ML IV SOLN
500.0000 [IU] | Freq: Once | INTRAVENOUS | Status: AC
  Administered 2014-11-19: 500 [IU] via INTRAVENOUS
  Filled 2014-11-19: qty 5

## 2014-11-19 MED ORDER — SODIUM CHLORIDE 0.9 % IJ SOLN
10.0000 mL | INTRAMUSCULAR | Status: DC | PRN
  Administered 2014-11-19: 10 mL via INTRAVENOUS
  Filled 2014-11-19: qty 10

## 2014-11-19 NOTE — Progress Notes (Signed)
Patient does not have living will.  Former smoker.  Patient here today for survivorship visit.

## 2014-11-19 NOTE — Progress Notes (Unsigned)
Survivorship care plan visit completed.  SCP reviewed along with treatment summary and given to patient.  Talked to patient about cancer transitions and other available resources.  CARE program and exercise programs encouraged.  Encouraged patient to call or notify us for any questions or concerns.

## 2014-11-20 LAB — CA 125: CA 125: 30.8 U/mL (ref 0.0–38.1)

## 2014-11-21 ENCOUNTER — Encounter: Payer: Self-pay | Admitting: Oncology

## 2014-11-21 NOTE — Progress Notes (Signed)
Debbie Richardson @ [facility] [phone]  Fax:([phone]) [place]: 02-21-1961  MR#: [id number]  CBJ#:[id number]  Patient Care Team: [name], MD as PCP - General  CHIEF COMPLAINT:  Chief Complaint  Patient presents with  . 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 patient had severe allergic reaction to Taxol.  Given during desensitizing protocol with Taxol patient broke out in hives.  Patient is here for initiation of chemotherapy with carboplatinum and Taxotere (June 02, 2014) 2.  Finished total 6 cycles of chemotherapy with carboplatinum and Taxotere June of 2016 3.  Patient underwent exploratory laparotomy bilateral oophorectomy and omentectomy in July of 2016 evidence of malignancy was found.     Ovarian cancer   07/23/2014 Initial Diagnosis Ovarian cancer    Oncology Flowsheet 08/05/2014 08/25/2014 08/27/2014 09/16/2014 09/16/2014 09/17/2014 10/29/2014  Day, Cycle - Day 1, Cycle 5 - Day 1, Cycle 6   - -  CARBOplatin (PARAPLATIN) IV - 490 mg - 490 mg   - -  dexamethasone (DECADRON) IJ - - - - - - -  dexamethasone (DECADRON) IV - [ 12 mg ] - [ 12 mg ]   - -  DOCEtaxel (TAXOTERE) IV - 70 mg/m2 - 70 mg/m2 70 mg/m2 - -  fosaprepitant (EMEND) IV - [ 150 mg ] - [ 150 mg ]   - -  ondansetron (ZOFRAN) IV - - - - - - -  palonosetron (ALOXI) IV - 0.25 mg - 0.25 mg   - -  pegfilgrastim (NEULASTA) [place] 6 mg - 6 mg - - 6 mg -    INTERVAL HISTORY: 54 year old lady lady came today with a history of stage III ovarian cancer.  This is the fifth cycle of chemotherapy.  Tolerating treatment very well.  No chills.  No fever.  No rash.  No tingling numbness.  Patient has intermittent abdominal pain. Patient is here for ongoing evaluation and treatment consideration.  Patient has finished total 6 cycles of chemotherapy tolerated  treatment very well without any significant problem.  Patient had a rash after first chemotherapy with Taxotere.  With the help of Claritin and steroid rash resolved. A repeat CT scan here to discuss the result and further planning of treatment November 20, 2014 Patient came back for further evaluation after surgical intervention.  No evidence of malignancy was found. She is gradually recovering from surgery.  He has lower abdominal discomfort.  Wound has healed well.  No chills.  No fever.   REVIEW OF SYSTEMS:   ROS  general status: Patient is feeling weak and tired.  No change in a performance status.  No chills.  No fever. HEENT: Alopecia.  No evidence of stomatitis Lungs: No cough or shortness of breath Cardiac: No chest pain or paroxysmal nocturnal dyspnea GI: No nausea no vomiting no diarrhea no abdominal pain Skin: No rash Lower extremity no swelling Neurological system: No tingling.  No numbness.  No other focal signs Musculoskeletal system no bony pains   PAST MEDICAL HISTORY: Past Medical History  Diagnosis Date  . Dizziness     vertigo  . GERD (gastroesophageal reflux disease)   . Anxiety   . Gallstones   . Benign fundic gland polyps of stomach   . Arthritis   . Ovarian cancer  PAST SURGICAL HISTORY: Past Surgical History  Procedure Laterality Date  . Cholecystectomy    . Cesarean section    . Laparoscopic oopherectomy    . Colonoscopy    . Upper gi endoscopy    . Oophorectomy    . Laparoscopic hysterectomy N/A 10/29/2014    Procedure: HYSTERECTOMY TOTAL LAPAROSCOPIC, OMENTUMECTOMY;  Surgeon: Gillis Ends, MD;  Location: [facility] ORS;  Service: Gynecology;  Laterality: N/A;    FAMILY HISTORY Family History  Problem Relation Age of Onset  . Hypertension Mother   . Hyperlipidemia Mother   . GER disease Mother   . Heart disease Paternal Grandmother   . Heart disease Father   . Breast cancer Paternal Grandmother   . Kidney disease Neg Hx   . Liver  disease Neg Hx   . Colon cancer Neg Hx   . Colon polyps Neg Hx   . Esophageal cancer Neg Hx   . Pancreatic cancer Neg Hx         ADVANCED DIRECTIVES: Does not have advance care directive.. Information has been given.  Appointment with chaplain has been discussed with the patient and family   HEALTH MAINTENANCE: Social History  Substance Use Topics  . Smoking status: Former Research scientist (life sciences)  . Smokeless tobacco: Never Used  . Alcohol Use: 0.0 oz/week    0 Standard drinks or equivalent per week     Comment: 1 drink daily      Allergies  Allergen Reactions  . Ciprofloxacin Anaphylaxis  . Paclitaxel Shortness Of Breath and Other (See Comments)    Other reaction(s): Tight chest (finding) Chest and facial flushing Flushing, chest tightness, SOB w/ Taxol on 05/26/14 at [place].    Current Outpatient Prescriptions  Medication Sig Dispense Refill  . acetaminophen (TYLENOL) 500 MG tablet Take 2 tablets (1,000 mg total) by mouth every 6 (six) hours. 30 tablet 0  . ibuprofen (ADVIL,MOTRIN) 600 MG tablet Take 1 tablet (600 mg total) by mouth every 6 (six) hours as needed (mild pain). 30 tablet 0  . lidocaine-prilocaine (EMLA) cream Apply 1 application topically as needed.    . loratadine (CLARITIN) 10 MG tablet Take 10 mg by mouth daily.    Marland Kitchen omeprazole (PRILOSEC) 40 MG capsule Take 1 capsule (40 mg total) by mouth daily before breakfast. 30 capsule 11  . oxyCODONE (OXY IR/ROXICODONE) 5 MG immediate release tablet Take 1 tablet (5 mg total) by mouth every 4 (four) hours as needed for moderate pain or severe pain (pain 4-7 out of 10). 30 tablet 0  . potassium chloride (K-DUR) 10 MEQ tablet Take 20 mEq by mouth daily.      No current facility-administered medications for this visit.   Facility-Administered Medications Ordered in Other Visits  Medication Dose Route Frequency Provider Last Rate Last Dose  . sodium chloride 0.9 % injection 10 mL  10 mL Intracatheter PRN Forest Gleason, MD    10 mL at 08/04/14 1130  . sodium chloride 0.9 % injection 10 mL  10 mL Intravenous PRN Forest Gleason, MD   10 mL at 11/19/14 1628    OBJECTIVE: Filed Vitals:   11/19/14 1524  BP: 113/78  Pulse: 105  Temp: 98.1 F (36.7 C)     Body mass index is 22.72 kg/(m^2).    ECOG FS:0 - Asymptomatic  Physical Exam GENERAL:  Well developed, well nourished, sitting comfortably in the exam room in no acute distress. MENTAL STATUS:  Alert and oriented to person, place and time. HEAD:  Alopecia  Normocephalic, atraumatic, face symmetric, no Cushingoid features. EYES:  .  Pupils equal round and reactive to light and accomodation.  No conjunctivitis or scleral icterus. ENT:  Oropharynx clear without lesion.  Tongue normal. Mucous membranes moist.  RESPIRATORY:  Clear to auscultation without rales, wheezes or rhonchi. CARDIOVASCULAR:  Regular rate and rhythm without murmur, rub or gallop.  ABDOMEN:  Soft, non-tender, with active bowel sounds, and no hepatosplenomegaly.  No masses. Tenderness in lower abdominal area which is postoperative.  Wound has healed. BACK:  No CVA tenderness.  No tenderness on percussion of the back or rib cage. SKIN:  No rashes, ulcers or lesions. EXTREMITIES: No edema, no skin discoloration or tenderness.  No palpable cords. LYMPH NODES: No palpable cervical, supraclavicular, axillary or inguinal adenopathy  NEUROLOGICAL: Unremarkable. PSYCH:  Appropriate.  LAB RESULTS:     Component Value Date/Time   NA 133* 11/19/2014 1624   NA 135 07/14/2014 0955   K 3.2* 11/19/2014 1624   K 3.2* 07/21/2014 1518   CL 99* 11/19/2014 1624   CL 106 07/14/2014 0955   CO2 25 11/19/2014 1624   CO2 21* 07/14/2014 0955   GLUCOSE 86 11/19/2014 1624   GLUCOSE 138* 07/14/2014 0955   BUN 12 11/19/2014 1624   BUN 10 07/14/2014 0955   CREATININE 0.67 11/19/2014 1624   CREATININE 0.60 07/14/2014 0955   CALCIUM 8.9 11/19/2014 1624   CALCIUM 9.1 07/14/2014 0955   PROT 7.4 11/19/2014 1624    PROT 7.1 07/14/2014 0955   ALBUMIN 4.9 11/19/2014 1624   ALBUMIN 4.4 07/14/2014 0955   AST 22 11/19/2014 1624   AST 24 07/14/2014 0955   ALT 18 11/19/2014 1624   ALT 20 07/14/2014 0955   ALKPHOS 41 11/19/2014 1624   ALKPHOS 45 07/14/2014 0955   BILITOT 1.0 11/19/2014 1624   BILITOT 0.7 07/14/2014 0955   GFRNONAA >60 11/19/2014 1624   GFRNONAA >60 07/14/2014 0955   GFRNONAA >60 04/26/2014 1622   GFRAA >60 11/19/2014 1624   GFRAA >60 07/14/2014 0955   GFRAA >60 04/26/2014 1622    No results found for: SPEP, UPEP  Lab Results  Component Value Date   WBC 4.9 11/19/2014   NEUTROABS 2.9 11/19/2014   HGB 12.3 11/19/2014   HCT 35.6 11/19/2014   MCV 95.8 11/19/2014   PLT 194 11/19/2014      Chemistry      Component Value Date/Time   NA 133* 11/19/2014 1624   NA 135 07/14/2014 0955   K 3.2* 11/19/2014 1624   K 3.2* 07/21/2014 1518   CL 99* 11/19/2014 1624   CL 106 07/14/2014 0955   CO2 25 11/19/2014 1624   CO2 21* 07/14/2014 0955   BUN 12 11/19/2014 1624   BUN 10 07/14/2014 0955   CREATININE 0.67 11/19/2014 1624   CREATININE 0.60 07/14/2014 0955      Component Value Date/Time   CALCIUM 8.9 11/19/2014 1624   CALCIUM 9.1 07/14/2014 0955   ALKPHOS 41 11/19/2014 1624   ALKPHOS 45 07/14/2014 0955   AST 22 11/19/2014 1624   AST 24 07/14/2014 0955   ALT 18 11/19/2014 1624   ALT 20 07/14/2014 0955   BILITOT 1.0 11/19/2014 1624   BILITOT 0.7 07/14/2014 0955            Component Value Date/Time   COLORURINE AMBER* 10/29/2014 1406   COLORURINE Yellow 04/26/2014 1622   APPEARANCEUR CLOUDY* 10/29/2014 1406   APPEARANCEUR Clear 04/26/2014 1622   LABSPEC 1.030 10/29/2014 1406  LABSPEC 1.024 04/26/2014 1622   PHURINE 5.0 10/29/2014 1406   PHURINE 5.0 04/26/2014 1622   GLUCOSEU NEGATIVE 10/29/2014 1406   GLUCOSEU Negative 04/26/2014 1622   HGBUR 1+* 10/29/2014 1406   HGBUR Negative 04/26/2014 1622   HGBUR moderate 11/05/2008 1453   BILIRUBINUR NEGATIVE 10/29/2014  1406   BILIRUBINUR Negative 04/26/2014 1622   BILIRUBINUR neg. 03/04/2014 1558   KETONESUR TRACE* 10/29/2014 1406   KETONESUR Negative 04/26/2014 1622   PROTEINUR 30* 10/29/2014 1406   PROTEINUR Negative 04/26/2014 1622   PROTEINUR neg. 03/04/2014 1558   UROBILINOGEN 0.2 03/04/2014 1558   UROBILINOGEN 0.2 11/05/2008 1453   NITRITE NEGATIVE 10/29/2014 1406   NITRITE Negative 04/26/2014 1622   NITRITE neg. 03/04/2014 1558   LEUKOCYTESUR NEGATIVE 10/29/2014 1406   LEUKOCYTESUR Negative 04/26/2014 1622   Lab Results  Component Value Date   CA125 30.8 11/19/2014    ASSESSMENT: Carcinoma of ovary stage IIb status post initial surgery Patient had scrotal 6 cycles of chemotherapy with carboplatinum and Taxotere tolerated well. Patient has undergone bilateral oophorectomy omentectomy.  No evidence of tumor was found. Patient tolerated treatment very well without any significant neuropathy.  Recovering from the side effects.  PLAN:  All lab data has been reviewed  Tumor markers remain stable  Patient and number of questions regarding lifestyle which has been answered.  Patient has met our survivorship beam.  I also discussed possibility of GOG 205 study of lifestyle intervention Information was given and patient is going to think about participating in the trial. Evaluation in 6 months and in the interim.  Patient would be evaluated by Dr. [name] in November of 2016  Patient expressed understanding and was in agreement with this plan. She also understands that She can call clinic at any time with any questions, concerns, or complaints.    Ovarian cancer   Staging form: Ovary, AJCC 7th Edition     Clinical stage from 07/23/2014: Stage Unknown (yT2c, NX, M0) - Signed by Evlyn Kanner, NP on 07/23/2014   Forest Gleason, MD   11/21/2014 8:03 AM

## 2014-11-26 ENCOUNTER — Inpatient Hospital Stay (HOSPITAL_BASED_OUTPATIENT_CLINIC_OR_DEPARTMENT_OTHER): Payer: BLUE CROSS/BLUE SHIELD | Admitting: Obstetrics and Gynecology

## 2014-11-26 ENCOUNTER — Telehealth: Payer: Self-pay | Admitting: *Deleted

## 2014-11-26 ENCOUNTER — Encounter: Payer: Self-pay | Admitting: Obstetrics and Gynecology

## 2014-11-26 VITALS — BP 113/59 | HR 89 | Temp 97.7°F | Resp 18 | Ht 62.0 in | Wt 121.2 lb

## 2014-11-26 DIAGNOSIS — R1030 Lower abdominal pain, unspecified: Secondary | ICD-10-CM | POA: Insufficient documentation

## 2014-11-26 DIAGNOSIS — C569 Malignant neoplasm of unspecified ovary: Secondary | ICD-10-CM

## 2014-11-26 DIAGNOSIS — Z48816 Encounter for surgical aftercare following surgery on the genitourinary system: Secondary | ICD-10-CM

## 2014-11-26 DIAGNOSIS — R1032 Left lower quadrant pain: Secondary | ICD-10-CM

## 2014-11-26 MED ORDER — LIDOCAINE 5 % EX PTCH: 1.0000 | MEDICATED_PATCH | CUTANEOUS | Status: DC

## 2014-11-26 NOTE — Telephone Encounter (Signed)
Patient Spoke with md. Will see patient today as add on at 115 or 130pm.

## 2014-11-26 NOTE — Patient Instructions (Signed)
Lidocaine dermal patch What is this medicine? LIDOCAINE (LYE doe kane) causes loss of feeling in the skin and surrounding area. The medicine helps treat nerve pain from herpes (shingles) infection. This medicine may be used for other purposes; ask your health care provider or pharmacist if you have questions. COMMON BRAND NAME(S): Lidoderm What should I tell my health care provider before I take this medicine? They need to know if you have any of these conditions: -liver disease -skin rash or inflamed and irritated skin -an unusual or allergic reaction to parabens, lidocaine, other medicines, foods, dyes, or preservatives -pregnant or trying to get pregnant -breast-feeding How should I use this medicine? Apply the patches over the most painful areas of skin. Make sure the skin does not have any open sores or rashes. If irritation or burning feelings occur, remove the patch or patches, and do not apply the patch again until the irritation resolves. Do not touch your eyes after touching a patch. The medicine can irritate your eyes. If medicine gets in your eye, flush the eye with water, and protect the eye until sensation returns. You may apply up to 3 patches to different skin areas at one time. Leave the patches on for only 12 hours. After a patch has been on your skin for up to 12 hours, remove the patch and throw it away. Do not apply another patch or patches for at least 12 hours. If you use more than 3 patches at a time or leave a patch on your skin for more than 12 hours, you may have serious side effects. Patches may be cut into smaller sizes with scissors before removing the adhesive liner. You may wear clothing over the patches. Do not use a heating pad or electric blanket over the patch. The patch may not stick to the skin if it gets wet. Avoid contact with water when wearing the patch. Use this medicine as directed. Talk to your pediatrician regarding the use of this medicine in children.  Special care may be needed. Overdosage: If you think you have taken too much of this medicine contact a poison control center or emergency room at once. NOTE: This medicine is only for you. Do not share this medicine with others. What if I miss a dose? Apply the patches as needed for pain. What may interact with this medicine? -dofetilide -heart medicines -MAOIs like Carbex, Eldepryl, Marplan, Nardil, and Parnate -other ointments or creams that may contain anesthetic medicine This list may not describe all possible interactions. Give your health care provider a list of all the medicines, herbs, non-prescription drugs, or dietary supplements you use. Also tell them if you smoke, drink alcohol, or use illegal drugs. Some items may interact with your medicine. What should I watch for while using this medicine? Be careful to avoid injury while the area is numb from the medicine, and you are not aware of pain. If you are going to have a MRI procedure, let your MRI technician know about the use of these patches. Some drug patches contain an aluminized backing that can become heated when exposed to MRI and may cause burns. You may need to temporarily remove the patch during the MRI procedure. What side effects may I notice from receiving this medicine? Side effects that you should report to your doctor or health care professional as soon as possible: -chest pain -dizziness -skin rash -swelling of face, lips, or tongue -wheezing or difficulty breathing Side effects that usually do not require medical  attention (report to your doctor or health care professional if they continue or are bothersome): -skin irritation such as redness or swelling -unusual sensations such as numbness, tingling, or burning feelings This list may not describe all possible side effects. Call your doctor for medical advice about side effects. You may report side effects to FDA at 1-800-FDA-1088. Where should I keep my  medicine? Keep out of the reach of children or pets. Accidental chewing or swallowing of a new or used patch may cause serious and life-threatening effects. A used patch still contains enough medicine to cause serious side effects and even death to children or pets. Store at room temperature between 15 and 30 degrees C (59 and 86 degrees F). Do not store the patches out of their wrappers. Throw away any unused medicine after the expiration date. NOTE: This sheet is a summary. It may not cover all possible information. If you have questions about this medicine, talk to your doctor, pharmacist, or health care provider.  2015, [employer]vier/Gold Standard. (2012-11-13 15:01:52)

## 2014-11-26 NOTE — Progress Notes (Signed)
Gynecologic Oncology Interval Note  Referring Provider: Dr. Forest Gleason, Dr. 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 woman who presents today for postoperative evaluation after undergoing interval TLH/omentectomy on 10/29/2014 for ovarian cancer. Her final pathology was negative. She complains of incisional pain involving her umbilical port but is otherwise doing well.     DIAGNOSIS:  A. UTERUS WITH CERVIX; HYSTERECTOMY:  - CERVIX WITH TUBAL METAPLASIA.  - INACTIVE ENDOMETRIUM.  - INTRAMURAL, SUBSEROSAL, AND SUBMUCOSAL LEIOMYOMAS (75 G UTERUS).  - FOCAL SEROSAL ENDOMETRIOSIS.  - NEGATIVE FOR MALIGNANCY.   B. OMENTUM; OMENTECTOMY:  - NEGATIVE FOR MALIGNANCY.   CYTOLOGYDIAGNOSIS:  A. PELVIC WASHINGS:  - NEGATIVE FOR MALIGNANCY.  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 Her history is as follows. She presented to the emergency room with acute pelvic pain in the left lower quadrant and suspected by ovarian torsion. Pelvic ultrasound on 04/26/2014 revealed multiple large septated cyst within both ovaries with mild thickening in septations. A uterine fibroid was noted in the uterine fundus measuring 2.5 cm. The uterus measured 7.9 x 4.2 x 4.7 cm. A CT scan was negative for any evidence of metastatic disease. CA125 was 45.  She was taken emergently to the operating room on 04/28/2014 by Dr. Glennon Mac and a laparoscopic procedure was performed.  Bilateral salpingo-oophorectomy with  placement of the cystic lesions in the Endo Catch bag and removal through the LLQ port. Per the operative note the right ovary did rupture and contents of the cysts were spilled into the pelvis. At the time of the surgery the upper abdominal exam was unremarkable and a cystoscopy was also performed which was negative. The final pathology  revealed high-grade serous adenocarcinoma involving both ovaries and fallopian tubes. Washings also revealed clusters of highly atypical glandular cells and surface involvement of the ovary was present. Staging was felt to be stage II at least high grade serous ovarian cancer.  She has received 6 cycles of carboplatin/taxane chemotherapy via an IV port, last treatment June 2016.  She had an allergy to taxol and was switched to taxotere for the last 4 cycles.    Postchemotherapy CT IMPRESSION 10/02/14: 1. Interval bilateral salpingo-oophorectomy. No recurrent adnexal mass or evidence of metastatic disease in the chest, abdomen or pelvis. 2. Lobularity of the uterus with heterogeneous enhancement consistent with fibroids. 3. Mild distal colonic diverticulosis.   Genetic testing was negative.   Problem List: Patient Active Problem List   Diagnosis Date Noted  . Abdominal pain 11/26/2014  . Status post laparoscopic hysterectomy 10/29/2014  . Ovarian cancer 07/23/2014  . Change in bowel habits 03/04/2014  . Laceration of hand, right 08/19/2013  . Allergic conjunctivitis and rhinitis 07/04/2012  . GERD (gastroesophageal reflux disease) 05/01/2007  . PSORIASIS NEC 12/07/2006    Past Medical History: Past Medical History  Diagnosis Date  . Dizziness     vertigo  . GERD (gastroesophageal reflux disease)   . Anxiety   . Gallstones   . Benign fundic gland polyps of stomach   . Arthritis   . Ovarian cancer     Past Surgical History: Past Surgical History  Procedure Laterality Date  . Cholecystectomy    . Cesarean section    . Laparoscopic oopherectomy    . Colonoscopy    . Upper gi endoscopy    . Oophorectomy    .  Laparoscopic hysterectomy N/A 10/29/2014    Procedure: HYSTERECTOMY TOTAL LAPAROSCOPIC, OMENTUMECTOMY;  Surgeon: Gillis Ends, MD;  Location: [facility] ORS;  Service: Gynecology;  Laterality: N/A;    Family History: Family History  Problem Relation Age of Onset   . Hypertension Mother   . Hyperlipidemia Mother   . GER disease Mother   . Heart disease Paternal Grandmother   . Heart disease Father   . Breast cancer Paternal Grandmother   . Kidney disease Neg Hx   . Liver disease Neg Hx   . Colon cancer Neg Hx   . Colon polyps Neg Hx   . Esophageal cancer Neg Hx   . Pancreatic cancer Neg Hx     Social History: Social History   Social History  . Marital Status: Married    Spouse Name: N/A  . Number of Children: 1  . Years of Education: N/A   Occupational History  . Not on file.   Social History Main Topics  . Smoking status: Former Research scientist (life sciences)  . Smokeless tobacco: Never Used  . Alcohol Use: 0.0 oz/week    0 Standard drinks or equivalent per week     Comment: 1 drink daily  . Drug Use: No  . Sexual Activity: Not on file   Other Topics Concern  . Not on file   Social History Narrative    Allergies: Allergies  Allergen Reactions  . Ciprofloxacin Anaphylaxis  . Paclitaxel Shortness Of Breath and Other (See Comments)    Other reaction(s): Tight chest (finding) Chest and facial flushing Flushing, chest tightness, SOB w/ Taxol on 05/26/14 at [place].    Current Medications: Current Outpatient Prescriptions  Medication Sig Dispense Refill  . acetaminophen (TYLENOL) 500 MG tablet Take 2 tablets (1,000 mg total) by mouth every 6 (six) hours. 30 tablet 0  . loratadine (CLARITIN) 10 MG tablet Take 10 mg by mouth daily.    Marland Kitchen omeprazole (PRILOSEC) 40 MG capsule Take 1 capsule (40 mg total) by mouth daily before breakfast. 30 capsule 11  . lidocaine (LIDODERM) 5 % Place 1 patch onto the skin daily. Remove & Discard patch within 12 hours or as directed by MD 30 patch 0   No current facility-administered medications for this visit.   Facility-Administered Medications Ordered in Other Visits  Medication Dose Route Frequency Provider Last Rate Last Dose  . sodium chloride 0.9 % injection 10 mL  10 mL Intracatheter PRN Forest Gleason, MD   10 mL at 08/04/14 1130  . sodium chloride 0.9 % injection 10 mL  10 mL Intravenous PRN Forest Gleason, MD   10 mL at 11/19/14 1628    Genetic Testing: negative   Review of Systems General: decreased appetite  HEENT: no complaints  Lungs: no complaints  Cardiac: no complaints  GI: abdominal pain around the left aspect of the umbilical incision  GU: no complaints  Musculoskeletal: no complaints  Extremities: no complaints  Skin: no complaints  Neuro: no complaints  Endocrine: no complaints  Psych: no complaints       Objective:   Filed Vitals:   11/26/14 1402  BP: 113/59  Pulse: 89  Temp: 97.7 F (36.5 C)  Resp: 18    Body mass index is 22.17 kg/(m^2).  ECOG Performance Status: 0 - Asymptomatic  General appearance: alert, cooperative and appears stated age HEENT:PERRLA, extra ocular movement intact, sclera clear, anicteric Abdomen: soft, ND, tender at the LLQ approximately 4-6 cm away from the umbilicus and fullness present. No  hernias or ascites and incisions well healed.  Extremities: extremities normal, atraumatic, no cyanosis or edema Neurological exam reveals alert, oriented, normal speech, no focal findings or movement disorder noted.  Pelvic: exam chaperoned by nurse;  Vulva: normal appearing vulva with no masses, tenderness or lesions; Vagina: normal vagina and cuff healing well with sutures intact; BME: nontender and no masses; UterusCervix: surgically absent. Rectal: not indicated and normal rectal, no masses  Lab Review CA-125 pending. Lab Results  Component Value Date   CA125 30.8 11/19/2014   CA125 18.0 10/08/2014   CA125 16.1 08/25/2014    Assessment:  NEELIE WELSHANS is a 54 y.o. female with a history of stage II at least high grade serous ovarian cancer. Status post LS BSO and 6 cycles of platin/taxane chemotherapy.  No evidence of disease on CT or exam and CA125 normal.  Interval TLH/Omentectomy negative for malignancy.   Plan:    Problem List Items Addressed This Visit      Genitourinary   Ovarian cancer (Chronic)   Relevant Orders   CA 125     Other   Abdominal pain - Primary   Relevant Medications   lidocaine (LIDODERM) 5 %     I recommended the lidoderm patch for pain control. We will continue to follow closely. If symptoms not improved in 4 weeks then obtain CT scan. If symptoms resolve we will start surveillance and alternate visits with Dr. Glennon Mac and [facility] every 3-4 months.   If continued NED I have recommended continued close follow up with exams, including pelvic exams, and CA125 levels every 3-4 months for the first 2 years, then every 6 months until 5 years and then annually thereafter. Gillis Ends, MD  CC:  Dr. Prentice Docker  Dr. Blain Pais

## 2014-11-26 NOTE — Telephone Encounter (Signed)
c/o belly button incision discomfort. "There is a nagging pinching sensation at belly button; pain is worse when sitting down for long periods of time. My umbilical area is tender and swollen. Pain gets better with standing and lying flat and is worse with moving feels pain with moving. states no fever; takes tylenol otc po for tenderness" pt reports normal bowel function. Pt does not want to wait until 3 more weeks to see gyn oncologist."

## 2014-12-17 ENCOUNTER — Ambulatory Visit: Payer: BLUE CROSS/BLUE SHIELD

## 2014-12-31 ENCOUNTER — Inpatient Hospital Stay (HOSPITAL_BASED_OUTPATIENT_CLINIC_OR_DEPARTMENT_OTHER): Payer: BLUE CROSS/BLUE SHIELD | Admitting: Obstetrics and Gynecology

## 2014-12-31 ENCOUNTER — Inpatient Hospital Stay: Payer: BLUE CROSS/BLUE SHIELD | Attending: Oncology

## 2014-12-31 VITALS — BP 118/83 | HR 96 | Temp 98.8°F | Ht 62.0 in | Wt 124.6 lb

## 2014-12-31 DIAGNOSIS — C569 Malignant neoplasm of unspecified ovary: Secondary | ICD-10-CM

## 2014-12-31 DIAGNOSIS — Z452 Encounter for adjustment and management of vascular access device: Secondary | ICD-10-CM | POA: Insufficient documentation

## 2014-12-31 DIAGNOSIS — C801 Malignant (primary) neoplasm, unspecified: Secondary | ICD-10-CM

## 2014-12-31 DIAGNOSIS — Z48816 Encounter for surgical aftercare following surgery on the genitourinary system: Secondary | ICD-10-CM

## 2014-12-31 DIAGNOSIS — R1032 Left lower quadrant pain: Secondary | ICD-10-CM

## 2014-12-31 MED ORDER — SODIUM CHLORIDE 0.9 % IJ SOLN
10.0000 mL | Freq: Once | INTRAMUSCULAR | Status: AC
  Administered 2014-12-31: 10 mL via INTRAVENOUS
  Filled 2014-12-31: qty 10

## 2014-12-31 MED ORDER — HEPARIN SOD (PORK) LOCK FLUSH 100 UNIT/ML IV SOLN
INTRAVENOUS | Status: AC
  Filled 2014-12-31: qty 5

## 2014-12-31 MED ORDER — HEPARIN SOD (PORK) LOCK FLUSH 100 UNIT/ML IV SOLN
500.0000 [IU] | Freq: Once | INTRAVENOUS | Status: AC
  Administered 2014-12-31: 500 [IU] via INTRAVENOUS

## 2014-12-31 NOTE — Addendum Note (Signed)
Addended by: Roselind Messier T on: 12/31/2014 10:26 AM   Modules accepted: Orders

## 2014-12-31 NOTE — Progress Notes (Signed)
Gynecologic Oncology Interval Note  Referring Keilani Terrance: Dr. Forest Gleason, Dr. 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 woman who presents today for postoperative evaluation after undergoing interval TLH/omentectomy on 10/29/2014 for ovarian cancer. Her final pathology was negative. She continues to complain of incisional pain involving the area to the left of her umbilical port.  It is tender and hurts when she moves.  No GI or GU complaints and is otherwise doing well.     DIAGNOSIS:  A. UTERUS WITH CERVIX; HYSTERECTOMY:  - CERVIX WITH TUBAL METAPLASIA.  - INACTIVE ENDOMETRIUM.  - INTRAMURAL, SUBSEROSAL, AND SUBMUCOSAL LEIOMYOMAS (75 G UTERUS).  - FOCAL SEROSAL ENDOMETRIOSIS.  - NEGATIVE FOR MALIGNANCY.   B. OMENTUM; OMENTECTOMY:  - NEGATIVE FOR MALIGNANCY.   CYTOLOGYDIAGNOSIS:  A. PELVIC WASHINGS:  - NEGATIVE FOR MALIGNANCY.  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 Her history is as follows. She presented to the emergency room with acute pelvic pain in the left lower quadrant and suspected by ovarian torsion. Pelvic ultrasound on 04/26/2014 revealed multiple large septated cyst within both ovaries with mild thickening in septations. A uterine fibroid was noted in the uterine fundus measuring 2.5 cm. The uterus measured 7.9 x 4.2 x 4.7 cm. A CT scan was negative for any evidence of metastatic disease. CA125 was 45.  She was taken emergently to the operating room on 04/28/2014 by Dr. Glennon Mac and a laparoscopic procedure was performed.  Bilateral salpingo-oophorectomy with  placement of the cystic lesions in the Endo Catch bag and removal through the LLQ port. Per the operative note the right ovary did rupture and contents of the cysts were spilled into the pelvis. At the time of the  surgery the upper abdominal exam was unremarkable and a cystoscopy was also performed which was negative. The final pathology revealed high-grade serous adenocarcinoma involving both ovaries and fallopian tubes. Washings also revealed clusters of highly atypical glandular cells and surface involvement of the ovary was present. Staging was felt to be stage II at least high grade serous ovarian cancer.  She received 6 cycles of carboplatin/taxane chemotherapy via an IV port, last treatment June 2016.  She had an allergy to taxol and was switched to taxotere for the last 4 cycles.    Postchemotherapy CT IMPRESSION 10/02/14: 1. Interval bilateral salpingo-oophorectomy. No recurrent adnexal mass or evidence of metastatic disease in the chest, abdomen or pelvis. 2. Lobularity of the uterus with heterogeneous enhancement consistent with fibroids. 3. Mild distal colonic diverticulosis.  Genetic testing was negative.   Problem List: Patient Active Problem List   Diagnosis Date Noted  . Abdominal pain 11/26/2014  . Status post laparoscopic hysterectomy 10/29/2014  . Ovarian cancer 07/23/2014  . Change in bowel habits 03/04/2014  . Laceration of hand, right 08/19/2013  . Allergic conjunctivitis and rhinitis 07/04/2012  . GERD (gastroesophageal reflux disease) 05/01/2007  . PSORIASIS NEC 12/07/2006    Past Medical History: Past Medical History  Diagnosis Date  . Dizziness     vertigo  . GERD (gastroesophageal reflux disease)   . Anxiety   . Gallstones   . Benign fundic gland polyps of stomach   . Arthritis   . Ovarian cancer     Past Surgical History: Past Surgical History  Procedure Laterality Date  . Cholecystectomy    . Cesarean  section    . Laparoscopic oopherectomy    . Colonoscopy    . Upper gi endoscopy    . Oophorectomy    . Laparoscopic hysterectomy N/A 10/29/2014    Procedure: HYSTERECTOMY TOTAL LAPAROSCOPIC, OMENTUMECTOMY;  Surgeon: Gillis Ends, MD;  Location:  [facility] ORS;  Service: Gynecology;  Laterality: N/A;    Family History: Family History  Problem Relation Age of Onset  . Hypertension Mother   . Hyperlipidemia Mother   . GER disease Mother   . Heart disease Paternal Grandmother   . Heart disease Father   . Breast cancer Paternal Grandmother   . Kidney disease Neg Hx   . Liver disease Neg Hx   . Colon cancer Neg Hx   . Colon polyps Neg Hx   . Esophageal cancer Neg Hx   . Pancreatic cancer Neg Hx     Social History: Social History   Social History  . Marital Status: Married    Spouse Name: N/A  . Number of Children: 1  . Years of Education: N/A   Occupational History  . Not on file.   Social History Main Topics  . Smoking status: Former Research scientist (life sciences)  . Smokeless tobacco: Never Used  . Alcohol Use: 0.0 oz/week    0 Standard drinks or equivalent per week     Comment: 1 drink daily  . Drug Use: No  . Sexual Activity: Not on file   Other Topics Concern  . Not on file   Social History Narrative    Allergies: Allergies  Allergen Reactions  . Ciprofloxacin Anaphylaxis  . Paclitaxel Shortness Of Breath and Other (See Comments)    Other reaction(s): Tight chest (finding) Chest and facial flushing Flushing, chest tightness, SOB w/ Taxol on 05/26/14 at [place].    Current Medications: Current Outpatient Prescriptions  Medication Sig Dispense Refill  . acetaminophen (TYLENOL) 500 MG tablet Take 2 tablets (1,000 mg total) by mouth every 6 (six) hours. 30 tablet 0  . lidocaine (LIDODERM) 5 % Place 1 patch onto the skin daily. Remove & Discard patch within 12 hours or as directed by MD 30 patch 0  . loratadine (CLARITIN) 10 MG tablet Take 10 mg by mouth daily.    Marland Kitchen omeprazole (PRILOSEC) 40 MG capsule Take 1 capsule (40 mg total) by mouth daily before breakfast. 30 capsule 11   No current facility-administered medications for this visit.   Facility-Administered Medications Ordered in Other Visits  Medication Dose  Route Frequency Danzel Marszalek Last Rate Last Dose  . sodium chloride 0.9 % injection 10 mL  10 mL Intracatheter PRN Forest Gleason, MD   10 mL at 08/04/14 1130  . sodium chloride 0.9 % injection 10 mL  10 mL Intravenous PRN Forest Gleason, MD   10 mL at 11/19/14 1628    Genetic Testing: negative   Review of Systems General: good energy  HEENT: no complaints  Lungs: no complaints  Cardiac: no complaints  GI: abdominal pain around the left aspect of the umbilical incision  GU: no complaints  Musculoskeletal: no complaints  Extremities: no complaints  Skin: no complaints  Neuro: no complaints  Endocrine: no complaints  Psych: no complaints       Objective:   Filed Vitals:   12/31/14 0824  BP: 118/83  Pulse: 96  Temp: 98.8 F (37.1 C)    Body mass index is 22.78 kg/(m^2).  ECOG Performance Status: 0 - Asymptomatic  General appearance: alert, cooperative and appears stated age 54,  extra ocular movement intact, sclera clear, anicteric Abdomen: soft, tender at the LLQ approximately 4-6 cm away from the umbilicus, no fullness present. No hernias, masses, infection or ascites and incisions well healed.  Extremities: extremities normal, atraumatic, no cyanosis or edema Neurological exam reveals alert, oriented, normal speech, no focal findings or movement disorder noted.  Pelvic: exam chaperoned by nurse;  Vulva: normal appearing vulva with no masses, tenderness or lesions; Vagina: normal vagina and cuff healing well; BME: nontender and no masses; UterusCervix: surgically absent. Rectal: no masses  Lab Results  Component Value Date   CA125 30.8 11/19/2014   CA125 18.0 10/08/2014   CA125 16.1 08/25/2014    Assessment:  Debbie Richardson is a 54 y.o. female with a history of stage II at least high grade serous ovarian cancer. Status post LS BSO and 6 cycles of platin/taxane chemotherapy.  No evidence of disease on CT or exam and CA125 normal.  Interval TLH/Omentectomy 7/16  negative for malignancy.  She comes back today because of continued tenderness in the abdominal wall lateral to the umbilical port site.  There is no evidence of hernia, fluid collection, infection or mass.   Plan:   Problem List Items Addressed This Visit      Genitourinary   Ovarian cancer - Primary (Chronic)     We will obtain CT scan of abdomen and pelvis in view of continued abdominal pain.  This seems to be in the abdominal wall, but I cannot detect a hernia or fluid collection. Perhaps the CT scan will be more revealing.  If it is normal it may be worthwhile referring her to the pain clinic to consider the possibility of some sort of nerve entrapment issue.  They might want to do a local anesthetic block to see if this alleviates the symptoms.   We will start surveillance and alternate visits with Dr. Glennon Mac and [facility] every 3-4 months with CA125.  Last CA125 was a bit higher, but this is consistent with being 1 month post op.   If continued NED I have recommended continued close follow up with exams, including pelvic exams, and CA125 levels every 3-4 months for the first 2 years, then every 6 months until 5 years and then annually thereafter. Mellody Drown, MD  CC:  Dr. Prentice Docker  Dr. Blain Pais

## 2014-12-31 NOTE — Progress Notes (Signed)
Patient here for follow-up complaints of persistent pain at the umbilicus.

## 2014-12-31 NOTE — Progress Notes (Signed)
Corrected order for CT

## 2015-01-06 ENCOUNTER — Ambulatory Visit: Payer: BLUE CROSS/BLUE SHIELD

## 2015-01-08 ENCOUNTER — Other Ambulatory Visit: Payer: BLUE CROSS/BLUE SHIELD

## 2015-01-08 ENCOUNTER — Ambulatory Visit: Payer: BLUE CROSS/BLUE SHIELD | Admitting: Oncology

## 2015-01-09 ENCOUNTER — Ambulatory Visit
Admission: RE | Admit: 2015-01-09 | Discharge: 2015-01-09 | Disposition: A | Discharge status: Home / Self Care | Payer: BLUE CROSS/BLUE SHIELD | Source: Ambulatory Visit | Attending: Obstetrics and Gynecology | Admitting: Obstetrics and Gynecology

## 2015-01-09 DIAGNOSIS — C569 Malignant neoplasm of unspecified ovary: Secondary | ICD-10-CM

## 2015-01-09 DIAGNOSIS — R1032 Left lower quadrant pain: Secondary | ICD-10-CM

## 2015-01-09 MED ORDER — IOHEXOL 300 MG/ML  SOLN
100.0000 mL | Freq: Once | INTRAMUSCULAR | Status: AC | PRN
  Administered 2015-01-09: 100 mL via INTRAVENOUS

## 2015-01-14 ENCOUNTER — Telehealth: Payer: Self-pay | Admitting: *Deleted

## 2015-01-14 NOTE — Telephone Encounter (Signed)
CLINICAL DATA: Left abdominal pain. Recent laparoscopic surgery for oophorectomy  EXAM: CT ABDOMEN AND PELVIS WITH CONTRAST  TECHNIQUE: Multidetector CT imaging of the abdomen and pelvis was performed using the standard protocol following bolus administration of intravenous contrast.  CONTRAST: 131mL OMNIPAQUE IOHEXOL 300 MG/ML SOLN  COMPARISON: 10/02/2014  FINDINGS: Lower chest: No pleural or pericardial effusion identified.  Hepatobiliary: There is no suspicious liver abnormality identified. Previous cholecystectomy. No significant biliary dilatation.  Pancreas: The pancreas appears normal.  Spleen: Negative.  Adrenals/Urinary Tract: The adrenal glands are normal. Normal appearance of the kidneys. Urinary bladder appears normal.  Stomach/Bowel: The stomach is within normal limits. The small bowel loops have a normal course and caliber. No obstruction. Normal appearance of the colon. The appendix is visualized and appears normal.  Vascular/Lymphatic: Normal appearance of the abdominal aorta. No enlarged retroperitoneal or mesenteric adenopathy. No enlarged pelvic or inguinal lymph nodes.  Reproductive: Previous hysterectomy and bilateral oophorectomy. No adnexal mass identified.  Other: No free fluid or fluid collections identified within the abdomen or the pelvis.  Musculoskeletal: No aggressive lytic or sclerotic bone lesions.  IMPRESSION: 1. No acute findings within the abdomen or pelvis. 2. No findings identified to explain patient's abdominal pain.   Electronically Signed  By: Kerby Moors M.D.  On: 01/09/2015 09:35

## 2015-01-22 ENCOUNTER — Telehealth: Payer: Self-pay | Admitting: *Deleted

## 2015-01-22 NOTE — Telephone Encounter (Signed)
Telephoned patient regarding Dr. Gershon Crane request that she have a PET if her pain was not improved. Patient requested that Dr. [name] regarding the need for a PET.

## 2015-01-26 ENCOUNTER — Telehealth: Payer: Self-pay

## 2015-01-26 ENCOUNTER — Telehealth: Payer: Self-pay | Admitting: Obstetrics and Gynecology

## 2015-01-26 NOTE — Telephone Encounter (Signed)
  Oncology Nurse Navigator Documentation    Navigator Encounter Type: Telephone (01/26/15 1000)                      Time Spent with Patient: 30 (01/26/15 1000)   Contacted Debbie Richardson regarding her recent questions about Pet scan. She reports that she is anxious that Dr [name] recommends PET scan if she is still having abdominal pain. Reiterated that recent CT was negative. She would like see Dr [name] or talk to her regarding this. Will help coordinate.

## 2015-01-26 NOTE — Telephone Encounter (Signed)
I contacted the patient and spoke with her regarding her abdominal pain and my recommendation for a PET scan. She states that her pain in improving. We will cancel the PET scan. If her pain persists and may be nerve pain we will have her see anesthesiology and determine if local anesthetic may be helpful. She is scheduled to see Dr. Oliva Bustard in one week. I asked our team to please set up an appt for her to see me Jan 2017 for follow up and surveillance.   We also discussed vasomotor symptoms. She is having severe hot flashes at night which started after she stopped the HRT combipatch. She has a serous cancer so if her symptoms are severe and limiting her QoL I think it is reasonable to start low dose ERT. At this time she declined.   Gillis Ends, MD

## 2015-01-26 NOTE — Telephone Encounter (Signed)
  Oncology Nurse Navigator Documentation    Navigator Encounter Type: Telephone (01/26/15 1600)                      Time Spent with Patient: 15 (01/26/15 1600)   Dr [name] called and spoke with Debbie Richardson. They have decided not to do PET scan and to schedule follow up in January. Appt made for 04/15/15 at 1000. Voicemail left regarding appt and mailed to home address.

## 2015-02-09 ENCOUNTER — Telehealth: Payer: Self-pay | Admitting: *Deleted

## 2015-02-09 DIAGNOSIS — R5383 Other fatigue: Secondary | ICD-10-CM

## 2015-02-09 DIAGNOSIS — R634 Abnormal weight loss: Secondary | ICD-10-CM

## 2015-02-09 DIAGNOSIS — C569 Malignant neoplasm of unspecified ovary: Secondary | ICD-10-CM

## 2015-02-09 NOTE — Telephone Encounter (Signed)
Called to report that she is feeling very sluggish, has decreased appetite, and has lost 10 lbs since July unintentionally. Has an appt for 11/16, but asking to move appt up. She has been rescheduled to 11/9

## 2015-02-11 ENCOUNTER — Inpatient Hospital Stay: Payer: BLUE CROSS/BLUE SHIELD

## 2015-02-11 ENCOUNTER — Encounter: Payer: Self-pay | Admitting: Oncology

## 2015-02-11 ENCOUNTER — Inpatient Hospital Stay: Payer: BLUE CROSS/BLUE SHIELD | Attending: Oncology | Admitting: Oncology

## 2015-02-11 VITALS — BP 101/73 | HR 116 | Temp 97.5°F | Wt 122.1 lb

## 2015-02-11 DIAGNOSIS — Z87891 Personal history of nicotine dependence: Secondary | ICD-10-CM

## 2015-02-11 DIAGNOSIS — C569 Malignant neoplasm of unspecified ovary: Secondary | ICD-10-CM

## 2015-02-11 DIAGNOSIS — Z79899 Other long term (current) drug therapy: Secondary | ICD-10-CM

## 2015-02-11 DIAGNOSIS — K317 Polyp of stomach and duodenum: Secondary | ICD-10-CM | POA: Diagnosis not present

## 2015-02-11 DIAGNOSIS — R634 Abnormal weight loss: Secondary | ICD-10-CM

## 2015-02-11 DIAGNOSIS — Z9221 Personal history of antineoplastic chemotherapy: Secondary | ICD-10-CM

## 2015-02-11 DIAGNOSIS — R5383 Other fatigue: Secondary | ICD-10-CM

## 2015-02-11 DIAGNOSIS — K219 Gastro-esophageal reflux disease without esophagitis: Secondary | ICD-10-CM | POA: Diagnosis not present

## 2015-02-11 DIAGNOSIS — Z803 Family history of malignant neoplasm of breast: Secondary | ICD-10-CM | POA: Insufficient documentation

## 2015-02-11 DIAGNOSIS — Z452 Encounter for adjustment and management of vascular access device: Secondary | ICD-10-CM | POA: Diagnosis not present

## 2015-02-11 DIAGNOSIS — F419 Anxiety disorder, unspecified: Secondary | ICD-10-CM

## 2015-02-11 DIAGNOSIS — Z90722 Acquired absence of ovaries, bilateral: Secondary | ICD-10-CM | POA: Diagnosis not present

## 2015-02-11 DIAGNOSIS — R531 Weakness: Secondary | ICD-10-CM | POA: Diagnosis not present

## 2015-02-11 DIAGNOSIS — Z888 Allergy status to other drugs, medicaments and biological substances status: Secondary | ICD-10-CM | POA: Insufficient documentation

## 2015-02-11 LAB — CBC WITH DIFFERENTIAL/PLATELET
BASOS ABS: 0 10*3/uL (ref 0–0.1)
Basophils Relative: 1 %
EOS ABS: 0 10*3/uL (ref 0–0.7)
EOS PCT: 0 %
HCT: 39 % (ref 35.0–47.0)
Hemoglobin: 13.6 g/dL (ref 12.0–16.0)
LYMPHS ABS: 1.7 10*3/uL (ref 1.0–3.6)
Lymphocytes Relative: 31 %
MCH: 32.1 pg (ref 26.0–34.0)
MCHC: 34.8 g/dL (ref 32.0–36.0)
MCV: 92 fL (ref 80.0–100.0)
Monocytes Absolute: 0.3 10*3/uL (ref 0.2–0.9)
Monocytes Relative: 6 %
Neutro Abs: 3.3 10*3/uL (ref 1.4–6.5)
Neutrophils Relative %: 62 %
PLATELETS: 184 10*3/uL (ref 150–440)
RBC: 4.24 MIL/uL (ref 3.80–5.20)
RDW: 12.8 % (ref 11.5–14.5)
WBC: 5.3 10*3/uL (ref 3.6–11.0)

## 2015-02-11 LAB — COMPREHENSIVE METABOLIC PANEL
ALBUMIN: 4.7 g/dL (ref 3.5–5.0)
ALK PHOS: 44 U/L (ref 38–126)
ALT: 16 U/L (ref 14–54)
AST: 21 U/L (ref 15–41)
Anion gap: 8 (ref 5–15)
BUN: 17 mg/dL (ref 6–20)
CALCIUM: 9.2 mg/dL (ref 8.9–10.3)
CHLORIDE: 101 mmol/L (ref 101–111)
CO2: 26 mmol/L (ref 22–32)
CREATININE: 0.67 mg/dL (ref 0.44–1.00)
GFR calc Af Amer: >60 mL/min (ref >60)
GFR calc non Af Amer: >60 mL/min (ref >60)
GLUCOSE: 144 mg/dL — AB (ref 65–99)
Potassium: 3.8 mmol/L (ref 3.5–5.1)
SODIUM: 135 mmol/L (ref 135–145)
Total Bilirubin: 0.9 mg/dL (ref 0.3–1.2)
Total Protein: 7.1 g/dL (ref 6.5–8.1)

## 2015-02-11 MED ORDER — CITALOPRAM HYDROBROMIDE 20 MG PO TABS: 20.0000 mg | ORAL_TABLET | Freq: Every day | ORAL | Status: DC

## 2015-02-11 NOTE — Progress Notes (Signed)
Patient here for follow up post hysterectomy.  States it took her a full 8 weeks to recuperate.  She has noticed some weight loss and is concerned about it.  She was scheduled for appointment for next week but wanted to be seen earlier due to concerns.

## 2015-02-11 NOTE — Progress Notes (Signed)
[address] @ [facility] [phone]  Fax:([phone]) [place] OB: 1960/05/19  MR#: [id number]  ONG#:[id number]  Patient Care Team: Debbie Greenspan, MD as PCP - General  CHIEF COMPLAINT:  Chief Complaint  Patient presents with  . 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 patient had severe allergic reaction to Taxol.  Given during desensitizing protocol with Taxol patient broke out in hives.  Patient is here for initiation of chemotherapy with carboplatinum and Taxotere (June 02, 2014) 2.  Finished total 6 cycles of chemotherapy with carboplatinum and Taxotere June of 2016 3.  Patient underwent exploratory laparotomy bilateral oophorectomy and omentectomy in July of 2016   No evidence of malignancy was found.       Oncology Flowsheet 08/05/2014 08/25/2014 08/27/2014 09/16/2014 09/16/2014 09/17/2014 10/29/2014  Day, Cycle - Day 1, Cycle 5 - Day 1, Cycle 6   - -  CARBOplatin (PARAPLATIN) IV - 490 mg - 490 mg   - -  dexamethasone (DECADRON) IJ - - - - - - -  dexamethasone (DECADRON) IV - [ 12 mg ] - [ 12 mg ]   - -  DOCEtaxel (TAXOTERE) IV - 70 mg/m2 - 70 mg/m2 70 mg/m2 - -  fosaprepitant (EMEND) IV - [ 150 mg ] - [ 150 mg ]   - -  ondansetron (ZOFRAN) IV - - - - - - -  palonosetron (ALOXI) IV - 0.25 mg - 0.25 mg   - -  pegfilgrastim (NEULASTA) [place] 6 mg - 6 mg - - 6 mg -    INTERVAL HISTORY: 54 year old lady came today with a history of stage III ovarian cancer.  This is the fifth cycle of chemotherapy.  Tolerating treatment very well.  No chills.  No fever.  No rash.  No tingling numbness.  Patient has intermittent abdominal pain. Patient is here for ongoing evaluation and treatment consideration.  Patient has finished total 6 cycles of chemotherapy tolerated treatment very well without any significant problem.     Patient has finished total 6 cycles of chemotherapy also had a surgical intervention Since then most of the chemotherapy side effects has been resolving.  But patient continues to feel weak and tired. There are no significant issues with appetite.  Alopecia has resolved. Generalized feeling off weak and tiredness  REVIEW OF SYSTEMS:   ROS  general status: Patient is feeling weak and tired.  No change in a performance status.  No chills.  No fever. HEENT: .  No evidence of stomatitis Lungs: No cough or shortness of breath Cardiac: No chest pain or paroxysmal nocturnal dyspnea GI: No nausea no vomiting no diarrhea no abdominal pain Skin: No rash Lower extremity no swelling Neurological system: No tingling.  No numbness.  No other focal signs Musculoskeletal system no bony pains   PAST MEDICAL HISTORY: Past Medical History  Diagnosis Date  . Dizziness     vertigo  . GERD (gastroesophageal reflux disease)   . Anxiety   . Gallstones   . Benign fundic gland polyps of stomach   . Arthritis   . Ovarian cancer ([place])     PAST SURGICAL HISTORY: Past Surgical History  Procedure Laterality Date  . Cholecystectomy    . Cesarean section    . Laparoscopic oopherectomy    . Colonoscopy    .  Upper gi endoscopy    . Oophorectomy    . Laparoscopic hysterectomy N/A 10/29/2014    Procedure: HYSTERECTOMY TOTAL LAPAROSCOPIC, OMENTUMECTOMY;  Surgeon: Gillis Ends, MD;  Location: [facility] ORS;  Service: Gynecology;  Laterality: N/A;    FAMILY HISTORY Family History  Problem Relation Age of Onset  . Hypertension Mother   . Hyperlipidemia Mother   . GER disease Mother   . Heart disease Paternal Grandmother   . Heart disease Father   . Breast cancer Paternal Grandmother   . Kidney disease Neg Hx   . Liver disease Neg Hx   . Colon cancer Neg Hx   . Colon polyps Neg Hx   . Esophageal cancer Neg Hx   . Pancreatic cancer Neg Hx         ADVANCED DIRECTIVES: Does not have advance  care directive.. Information has been given.  Appointment with chaplain has been discussed with the patient and family   HEALTH MAINTENANCE: Social History  Substance Use Topics  . Smoking status: Former Research scientist (life sciences)  . Smokeless tobacco: Never Used  . Alcohol Use: 0.0 oz/week    0 Standard drinks or equivalent per week     Comment: 1 drink daily      Allergies  Allergen Reactions  . Ciprofloxacin Anaphylaxis  . Paclitaxel Shortness Of Breath and Other (See Comments)    Other reaction(s): Tight chest (finding) Chest and facial flushing Flushing, chest tightness, SO[facility]/ [facility]    Current Outpatient Prescriptions  Medication Sig Dispense Refill  . acetaminophen (TYLENOL) 500 MG tablet Take 2 tablets (1,000 mg total) by mouth every 6 (six) hours. 30 tablet 0  . lidocaine (LIDODERM) 5 % Place 1 patch onto the skin daily. Remove & Discard patch within 12 hours or as directed by MD 30 patch 0  . loratadine (CLARITIN) 10 MG tablet Take 10 mg by mouth daily.    Marland Kitchen omeprazole (PRILOSEC) 40 MG capsule Take 1 capsule (40 mg total) by mouth daily before breakfast. 30 capsule 11   No current facility-administered medications for this visit.   Facility-Administered Medications Ordered in Other Visits  Medication Dose Route Frequency Provider Last Rate Last Dose  . sodium chloride 0.9 % injection 10 mL  10 mL Intracatheter PRN Forest Gleason, MD   10 mL at 08/04/14 1130  . sodium chloride 0.9 % injection 10 mL  10 mL Intravenous PRN Forest Gleason, MD   10 mL at 11/19/14 1628    OBJECTIVE: Filed Vitals:   02/11/15 1540  BP: 101/73  Pulse: 116  Temp: 97.5 F (36.4 C)     Body mass index is 22.33 kg/(m^2).    ECOG FS:0 - Asymptomatic  Physical Exam GENERAL:  Well developed, well nourished, sitting comfortably in the exam room in no acute distress. MENTAL STATUS:  Alert and oriented to person, place and time. HEAD:  Alopecia  Normocephalic, atraumatic, face  symmetric, no Cushingoid features. EYES:  .  Pupils equal round and reactive to light and accomodation.  No conjunctivitis or scleral icterus. ENT:  Oropharynx clear without lesion.  Tongue normal. Mucous membranes moist.  RESPIRATORY:  Clear to auscultation without rales, wheezes or rhonchi. CARDIOVASCULAR:  Regular rate and rhythm without murmur, rub or gallop.  ABDOMEN:  Soft, non-tender, with active bowel sounds, and no hepatosplenomegaly.  No masses. Tenderness in lower abdominal area which is postoperative.  Wound has healed. BACK:  No CVA tenderness.  No tenderness on percussion of  the back or rib cage. SKIN:  No rashes, ulcers or lesions. EXTREMITIES: No edema, no skin discoloration or tenderness.  No palpable cords. LYMPH NODES: No palpable cervical, supraclavicular, axillary or inguinal adenopathy  NEUROLOGICAL: Unremarkable. PSYCH:  Appropriate.  LAB RESULTS:   No results found for: SPEP, UPEP  Lab Results  Component Value Date   WBC 5.3 02/11/2015   NEUTROABS 3.3 02/11/2015   HGB 13.6 02/11/2015   HCT 39.0 02/11/2015   MCV 92.0 02/11/2015   PLT 184 02/11/2015      Chemistry      Component Value Date/Time   NA 135 02/11/2015 1511   NA 135 07/14/2014 0955   K 3.8 02/11/2015 1511   K 3.2* 07/21/2014 1518   CL 101 02/11/2015 1511   CL 106 07/14/2014 0955   CO2 26 02/11/2015 1511   CO2 21* 07/14/2014 0955   BUN 17 02/11/2015 1511   BUN 10 07/14/2014 0955   CREATININE 0.67 02/11/2015 1511   CREATININE 0.60 07/14/2014 0955      Component Value Date/Time   CALCIUM 9.2 02/11/2015 1511   CALCIUM 9.1 07/14/2014 0955   ALKPHOS 44 02/11/2015 1511   ALKPHOS 45 07/14/2014 0955   AST 21 02/11/2015 1511   AST 24 07/14/2014 0955   ALT 16 02/11/2015 1511   ALT 20 07/14/2014 0955   BILITOT 0.9 02/11/2015 1511   BILITOT 0.7 07/14/2014 0955            Component Value Date/Time   COLORURINE AMBER* 10/29/2014 1406   COLORURINE Yellow 04/26/2014 1622    APPEARANCEUR CLOUDY* 10/29/2014 1406   APPEARANCEUR Clear 04/26/2014 1622   LABSPEC 1.030 10/29/2014 1406   LABSPEC 1.024 04/26/2014 1622   PHURINE 5.0 10/29/2014 1406   PHURINE 5.0 04/26/2014 1622   GLUCOSEU NEGATIVE 10/29/2014 1406   GLUCOSEU Negative 04/26/2014 1622   HGBUR 1+* 10/29/2014 1406   HGBUR Negative 04/26/2014 1622   HGBUR moderate 11/05/2008 1453   BILIRUBINUR NEGATIVE 10/29/2014 1406   BILIRUBINUR Negative 04/26/2014 1622   BILIRUBINUR neg. 03/04/2014 1558   KETONESUR TRACE* 10/29/2014 1406   KETONESUR Negative 04/26/2014 1622   PROTEINUR 30* 10/29/2014 1406   PROTEINUR Negative 04/26/2014 1622   PROTEINUR neg. 03/04/2014 1558   UROBILINOGEN 0.2 03/04/2014 1558   UROBILINOGEN 0.2 11/05/2008 1453   NITRITE NEGATIVE 10/29/2014 1406   NITRITE Negative 04/26/2014 1622   NITRITE neg. 03/04/2014 1558   LEUKOCYTESUR NEGATIVE 10/29/2014 1406   LEUKOCYTESUR Negative 04/26/2014 1622   Lab Results  Component Value Date   CA125 30.8 11/19/2014    ASSESSMENT: Carcinoma of ovary stage IIb status post initial surgery Patient had scrotal 6 cycles of chemotherapy with carboplatinum and Taxotere tolerated well. Patient has undergone bilateral oophorectomy omentectomy.  No evidence of tumor was found. Patient tolerated treatment very well without any significant neuropathy.  Recovering from the side effects.  PLAN:  All lab data has been reviewed Part of the problem may be mild depression associated with undergoing chemotherapy and surgical treatment Patient would be started on Celexa Patient was encouraged to attend survivorship meeting Was referred to the care program for physiotherapy And was encouraged to participate the lifestyle intervention study  Patient and number of questions regarding lifestyle which has been answered.  Patient has met our survivorship beam.  I also discussed possibility of GOG 205 study of lifestyle intervention Information was given and  patient is going to think about participating in the trial. Evaluation in 6 months and in the interim.  Patient would be evaluated by Dr. [name] in November of 2016  Patient expressed understanding and was in agreement with this plan. She also understands that She can call clinic at any time with any questions, concerns, or complaints.    Ovarian cancer   Staging form: Ovary, AJCC 7th Edition     Clinical stage from 07/23/2014: Stage Unknown (yT2c, NX, M0) - Signed by Evlyn Kanner, NP on 07/23/2014   Forest Gleason, MD   02/11/2015 3:47 PM

## 2015-02-12 LAB — CA 125: CA 125: 10.4 U/mL (ref 0.0–38.1)

## 2015-02-18 ENCOUNTER — Other Ambulatory Visit: Payer: BLUE CROSS/BLUE SHIELD

## 2015-02-18 ENCOUNTER — Ambulatory Visit: Payer: BLUE CROSS/BLUE SHIELD | Admitting: Oncology

## 2015-02-24 ENCOUNTER — Emergency Department
Admission: EM | Admit: 2015-02-24 | Discharge: 2015-02-24 | Disposition: A | Discharge status: Home / Self Care | Payer: BLUE CROSS/BLUE SHIELD | Attending: Emergency Medicine | Admitting: Emergency Medicine

## 2015-02-24 ENCOUNTER — Encounter: Payer: Self-pay | Admitting: Emergency Medicine

## 2015-02-24 DIAGNOSIS — R112 Nausea with vomiting, unspecified: Secondary | ICD-10-CM | POA: Diagnosis present

## 2015-02-24 DIAGNOSIS — Z87891 Personal history of nicotine dependence: Secondary | ICD-10-CM | POA: Diagnosis not present

## 2015-02-24 DIAGNOSIS — K529 Noninfective gastroenteritis and colitis, unspecified: Secondary | ICD-10-CM | POA: Diagnosis not present

## 2015-02-24 DIAGNOSIS — F419 Anxiety disorder, unspecified: Secondary | ICD-10-CM | POA: Diagnosis not present

## 2015-02-24 LAB — CBC WITH DIFFERENTIAL/PLATELET
BASOS PCT: 0 %
Basophils Absolute: 0 10*3/uL (ref 0–0.1)
EOS ABS: 0 10*3/uL (ref 0–0.7)
Eosinophils Relative: 1 %
HCT: 37.3 % (ref 35.0–47.0)
Hemoglobin: 13 g/dL (ref 12.0–16.0)
LYMPHS ABS: 1 10*3/uL (ref 1.0–3.6)
Lymphocytes Relative: 17 %
MCH: 32.4 pg (ref 26.0–34.0)
MCHC: 34.8 g/dL (ref 32.0–36.0)
MCV: 93.1 fL (ref 80.0–100.0)
Monocytes Absolute: 0.3 10*3/uL (ref 0.2–0.9)
Monocytes Relative: 5 %
Neutro Abs: 4.5 10*3/uL (ref 1.4–6.5)
Neutrophils Relative %: 77 %
PLATELETS: 151 10*3/uL (ref 150–440)
RBC: 4.01 MIL/uL (ref 3.80–5.20)
RDW: 12.7 % (ref 11.5–14.5)
WBC: 5.8 10*3/uL (ref 3.6–11.0)

## 2015-02-24 LAB — URINALYSIS COMPLETE WITH MICROSCOPIC (ARMC ONLY)
BILIRUBIN URINE: NEGATIVE
Bacteria, UA: NONE SEEN
GLUCOSE, UA: NEGATIVE mg/dL
Ketones, ur: NEGATIVE mg/dL
Leukocytes, UA: NEGATIVE
Nitrite: NEGATIVE
Protein, ur: NEGATIVE mg/dL
Specific Gravity, Urine: 1.006 (ref 1.005–1.030)
Squamous Epithelial / LPF: NONE SEEN
pH: 5 (ref 5.0–8.0)

## 2015-02-24 LAB — COMPREHENSIVE METABOLIC PANEL
ALT: 18 U/L (ref 14–54)
ANION GAP: 5 (ref 5–15)
AST: 21 U/L (ref 15–41)
Albumin: 4.1 g/dL (ref 3.5–5.0)
Alkaline Phosphatase: 40 U/L (ref 38–126)
BUN: 13 mg/dL (ref 6–20)
CALCIUM: 9.2 mg/dL (ref 8.9–10.3)
CO2: 24 mmol/L (ref 22–32)
Chloride: 110 mmol/L (ref 101–111)
Creatinine, Ser: 0.55 mg/dL (ref 0.44–1.00)
GFR calc non Af Amer: >60 mL/min (ref >60)
GLUCOSE: 106 mg/dL — AB (ref 65–99)
Potassium: 3.8 mmol/L (ref 3.5–5.1)
Sodium: 139 mmol/L (ref 135–145)
Total Bilirubin: 0.8 mg/dL (ref 0.3–1.2)
Total Protein: 6.8 g/dL (ref 6.5–8.1)

## 2015-02-24 MED ORDER — SODIUM CHLORIDE 0.9 % IV SOLN
Freq: Once | INTRAVENOUS | Status: AC
  Administered 2015-02-24: 08:00:00 via INTRAVENOUS

## 2015-02-24 MED ORDER — SODIUM CHLORIDE 0.9 % IV SOLN
Freq: Once | INTRAVENOUS | Status: AC
  Administered 2015-02-24: 09:00:00 via INTRAVENOUS
  Filled 2015-02-24: qty 1000

## 2015-02-24 MED ORDER — ONDANSETRON HCL 4 MG PO TABS: 4.0000 mg | ORAL_TABLET | Freq: Every day | ORAL | Status: DC | PRN

## 2015-02-24 MED ORDER — ONDANSETRON HCL 4 MG/2ML IJ SOLN
4.0000 mg | Freq: Once | INTRAMUSCULAR | Status: AC
  Administered 2015-02-24: 4 mg via INTRAVENOUS
  Filled 2015-02-24: qty 2

## 2015-02-24 MED ORDER — LORAZEPAM 1 MG PO TABS: 1.0000 mg | ORAL_TABLET | Freq: Two times a day (BID) | ORAL | Status: DC

## 2015-02-24 MED ORDER — LORAZEPAM 2 MG/ML IJ SOLN
0.5000 mg | Freq: Once | INTRAMUSCULAR | Status: AC
Start: 2015-02-24 — End: 2015-02-24
  Administered 2015-02-24: 0.5 mg via INTRAVENOUS
  Filled 2015-02-24: qty 1

## 2015-02-24 NOTE — ED Provider Notes (Signed)
[facility] Emergency Department Provider Note     Time seen: ----------------------------------------- 7:45 AM on 02/24/2015 -----------------------------------------    I have reviewed the triage vital signs and the nursing notes.   HISTORY  Chief Complaint Nausea; Emesis; and Diarrhea    HPI Debbie Richardson is a 54 y.o. female who presents ER for nausea vomiting diarrhea since she woke up this morning. Patient states she's also having difficulty breathing like she's choking, patient states to start taking citalopram last night was concerned she may be having a reaction to this. Patient currently is complete with cancer treatment for ovarian cancer, last finished chemotherapy in June. 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   Past Medical History  Diagnosis Date  . Dizziness     vertigo  . GERD (gastroesophageal reflux disease)   . Anxiety   . Gallstones   . Benign fundic gland polyps of stomach   . Arthritis   . Ovarian cancer [facility])     Patient Active Problem List   Diagnosis Date Noted  . Abdominal pain 11/26/2014  . Status post laparoscopic hysterectomy 10/29/2014  . Ovarian cancer ([place]) 07/23/2014  . Change in bowel habits 03/04/2014  . Laceration of hand, right 08/19/2013  . Allergic conjunctivitis and rhinitis 07/04/2012  . GERD (gastroesophageal reflux disease) 05/01/2007  . PSORIASIS NEC 12/07/2006    Past Surgical History  Procedure Laterality Date  . Cholecystectomy    . Cesarean section    . Laparoscopic oopherectomy    . Colonoscopy    . Upper gi endoscopy    . Oophorectomy    . Laparoscopic hysterectomy N/A 10/29/2014    Procedure: HYSTERECTOMY TOTAL LAPAROSCOPIC, OMENTUMECTOMY;  Surgeon: Gillis Ends, MD;  Location: [facility] ORS;  Service: Gynecology;  Laterality: N/A;    Allergies Ciprofloxacin and Paclitaxel  Social History Social History  Substance Use Topics  .  Smoking status: Former Research scientist (life sciences)  . Smokeless tobacco: Never Used  . Alcohol Use: 0.0 oz/week    0 Standard drinks or equivalent per week     Comment: 1 drink daily    Review of Systems Constitutional: Negative for fever. Eyes: Negative for visual changes. ENT: Negative for sore throat. Cardiovascular: Negative for chest pain. Respiratory: Positive for shortness of breath Gastrointestinal: Negative for abdominal pain, positive for vomiting and diarrhea Genitourinary: Negative for dysuria. Musculoskeletal: Negative for back pain. Skin: Negative for rash. Neurological: Negative for headaches, focal weakness or numbness.  10-point ROS otherwise negative.  ____________________________________________   PHYSICAL EXAM:  VITAL SIGNS: ED Triage Vitals  Enc Vitals Group     BP 02/24/15 0732 121/83 mmHg     Pulse Rate 02/24/15 0732 82     Resp 02/24/15 0732 18     Temp 02/24/15 0732 97.9 F (36.6 C)     Temp Source 02/24/15 0732 Oral     SpO2 02/24/15 0732 97 %     Weight 02/24/15 0732 122 lb (55.339 kg)     Height 02/24/15 0732 5\' 2"  (1.575 m)     Head Cir --      Peak Flow --      Pain Score 02/24/15 0733 0     Pain Loc --      Pain Edu? --      Excl. in [place]? --     Constitutional: Alert and oriented. Well appearing and in no distress. Eyes: Conjunctivae are normal. PERRL. Normal extraocular movements. ENT   Head: Normocephalic  and atraumatic.   Nose: No congestion/rhinnorhea.   Mouth/Throat: Mucous membranes are moist.   Neck: No stridor. Cardiovascular: Normal rate, regular rhythm. Normal and symmetric distal pulses are present in all extremities. No murmurs, rubs, or gallops. Respiratory: Normal respiratory effort without tachypnea nor retractions. Breath sounds are clear and equal bilaterally. No wheezes/rales/rhonchi. Gastrointestinal: Soft and nontender. No distention. No abdominal bruits.  Musculoskeletal: Nontender with normal range of motion in all  extremities. No joint effusions.  No lower extremity tenderness nor edema. Neurologic:  Normal speech and language. No gross focal neurologic deficits are appreciated. Speech is normal. No gait instability. Skin:  Skin is warm, dry and intact. No rash noted. Psychiatric: Mood and affect are normal. Speech and behavior are normal. Patient exhibits appropriate insight and judgment. ____________________________________________  ED COURSE:  Pertinent labs & imaging results that were available during my care of the patient were reviewed by me and considered in my medical decision making (see chart for details). Patient's no acute distress, will check basic labs and give IV fluid bolus. ____________________________________________    LABS (pertinent positives/negatives)  Labs Reviewed  COMPREHENSIVE METABOLIC PANEL - Abnormal; Notable for the following:    Glucose, Bld 106 (*)    All other components within normal limits  CBC WITH DIFFERENTIAL/PLATELET  URINALYSIS COMPLETEWITH MICROSCOPIC ([facility] ONLY)   ____________________________________________  FINAL ASSESSMENT AND PLAN  Vomiting and diarrhea, anxiety  Plan: Patient with labs and imaging as dictated above. Patient is feeling better after fluids and IV Ativan. Some of this was likely anxiety related in addition to gastrointestinal illness. She'll be discharged with antiemetics and anxiety medicine take as needed.   Earleen Newport, MD   Earleen Newport, MD 02/24/15 [phone]

## 2015-02-24 NOTE — Discharge Instructions (Signed)
Panic Attacks Panic attacks are sudden, short-livedsurges of severe anxiety, fear, or discomfort. They may occur for no reason when you are relaxed, when you are anxious, or when you are sleeping. Panic attacks may occur for a number of reasons:   Healthy people occasionally have panic attacks in extreme, life-threatening situations, such as war or natural disasters. Normal anxiety is a protective mechanism of the body that helps [place] react to danger (fight or flight response).  Panic attacks are often seen with anxiety disorders, such as panic disorder, social anxiety disorder, generalized anxiety disorder, and phobias. Anxiety disorders cause excessive or uncontrollable anxiety. They may interfere with your relationships or other life activities.  Panic attacks are sometimes seen with other mental illnesses, such as depression and posttraumatic stress disorder.  Certain medical conditions, prescription medicines, and drugs of abuse can cause panic attacks. SYMPTOMS  Panic attacks start suddenly, peak within 20 minutes, and are accompanied by four or more of the following symptoms:  Pounding heart or fast heart rate (palpitations).  Sweating.  Trembling or shaking.  Shortness of breath or feeling smothered.  Feeling choked.  Chest pain or discomfort.  Nausea or strange feeling in your stomach.  Dizziness, light-headedness, or feeling like you will faint.  Chills or hot flushes.  Numbness or tingling in your lips or hands and feet.  Feeling that things are not real or feeling that you are not yourself.  Fear of losing control or going crazy.  Fear of dying. Some of these symptoms can mimic serious medical conditions. For example, you may think you are having a heart attack. Although panic attacks can be very scary, they are not life threatening. DIAGNOSIS  Panic attacks are diagnosed through an assessment by your health care provider. Your health care provider will ask  questions about your symptoms, such as where and when they occurred. Your health care provider will also ask about your medical history and use of alcohol and drugs, including prescription medicines. Your health care provider may order blood tests or other studies to rule out a serious medical condition. Your health care provider may refer you to a mental health professional for further evaluation. TREATMENT   Most healthy people who have one or two panic attacks in an extreme, life-threatening situation will not require treatment.  The treatment for panic attacks associated with anxiety disorders or other mental illness typically involves counseling with a mental health professional, medicine, or a combination of both. Your health care provider will help determine what treatment is best for you.  Panic attacks due to physical illness usually go away with treatment of the illness. If prescription medicine is causing panic attacks, talk with your health care provider about stopping the medicine, decreasing the dose, or substituting another medicine.  Panic attacks due to alcohol or drug abuse go away with abstinence. Some adults need professional help in order to stop drinking or using drugs. HOME CARE INSTRUCTIONS   Take all medicines as directed by your health care provider.   Schedule and attend follow-up visits as directed by your health care provider. It is important to keep all your appointments. SEEK MEDICAL CARE IF:  You are not able to take your medicines as prescribed.  Your symptoms do not improve or get worse. SEEK IMMEDIATE MEDICAL CARE IF:   You experience panic attack symptoms that are different than your usual symptoms.  You have serious thoughts about hurting yourself or others.  You are taking medicine for panic attacks and  have a serious side effect. MAKE SURE YOU:  Understand these instructions.  Will watch your condition.  Will get help right away if you are not  doing well or get worse.   This information is not intended to replace advice given to you by your health care provider. Make sure you discuss any questions you have with your health care provider.   Document Released: 03/21/2005 Document Revised: 03/26/2013 Document Reviewed: 11/02/2012 Elsevier Interactive Patient Education 2016 [employer]. Norovirus Infection A norovirus infection is caused by exposure to a virus in a group of similar viruses (noroviruses). This type of infection causes inflammation in your stomach and intestines (gastroenteritis). Norovirus is the most common cause of gastroenteritis. It also causes food poisoning. Anyone can get a norovirus infection. It spreads very easily (contagious). You can get it from contaminated food, water, surfaces, or other people. Norovirus is found in the stool or vomit of infected people. You can spread the infection as soon as you feel sick until 2 weeks after you recover.  Symptoms usually begin within 2 days after you become infected. Most norovirus symptoms affect the digestive system. CAUSES Norovirus infection is caused by contact with norovirus. You can catch norovirus if you:  Eat or drink something contaminated with norovirus.  Touch surfaces or objects contaminated with norovirus and then put your hand in your mouth.  Have direct contact with an infected person who has symptoms.  Share food, drink, or utensils with someone with who is sick with norovirus. SIGNS AND SYMPTOMS Symptoms of norovirus may include:  Nausea.  Vomiting.  Diarrhea.  Stomach cramps.  Fever.  Chills.  Headache.  Muscle aches.  Tiredness. DIAGNOSIS Your health care provider may suspect norovirus based on your symptoms and physical exam. Your health care provider may also test a sample of your stool or vomit for the virus.  TREATMENT There is no specific treatment for norovirus. Most people get better without treatment in about 2  days. HOME CARE INSTRUCTIONS  Replace lost fluids by drinking plenty of water or rehydration fluids containing important minerals called electrolytes. This prevents dehydration. Drink enough fluid to keep your urine clear or pale yellow.  Do not prepare food for others while you are infected. Wait at least 3 days after recovering from the illness to do that. PREVENTION   Wash your hands often, especially after using the toilet or changing a diaper.  Wash fruits and vegetables thoroughly before preparing or serving them.  Throw out any food that a sick person may have touched.  Disinfect contaminated surfaces immediately after someone in the household has been sick. Use a bleach-based household cleaner.  Immediately remove and wash soiled clothes or sheets. SEEK MEDICAL CARE IF:  Your vomiting, diarrhea, and stomach pain is getting worse.  Your symptoms of norovirus do not go away after 2-3 days. SEEK IMMEDIATE MEDICAL CARE IF:  You develop symptoms of dehydration that do not improve with fluid replacement. This may include:  Excessive sleepiness.  Lack of tears.  Dry mouth.  Dizziness when standing.  Weak pulse.   This information is not intended to replace advice given to you by your health care provider. Make sure you discuss any questions you have with your health care provider.   Document Released: 06/11/2002 Document Revised: 04/11/2014 Document Reviewed: 08/29/2013 Elsevier Interactive Patient Education [employer].

## 2015-02-24 NOTE — ED Notes (Signed)
C/o n,v,d since she woke up this am, states she also having some difficulty breathing, and "it feels like I am choking", pt states she just started taking citalopram last night

## 2015-02-25 ENCOUNTER — Inpatient Hospital Stay: Payer: BLUE CROSS/BLUE SHIELD

## 2015-02-25 DIAGNOSIS — C801 Malignant (primary) neoplasm, unspecified: Secondary | ICD-10-CM

## 2015-02-25 DIAGNOSIS — C569 Malignant neoplasm of unspecified ovary: Secondary | ICD-10-CM | POA: Diagnosis not present

## 2015-02-25 MED ORDER — SODIUM CHLORIDE 0.9 % IJ SOLN
10.0000 mL | INTRAMUSCULAR | Status: DC | PRN
  Administered 2015-02-25: 10 mL via INTRAVENOUS
  Filled 2015-02-25: qty 10

## 2015-02-25 MED ORDER — HEPARIN SOD (PORK) LOCK FLUSH 100 UNIT/ML IV SOLN
500.0000 [IU] | Freq: Once | INTRAVENOUS | Status: AC
  Administered 2015-02-25: 500 [IU] via INTRAVENOUS
  Filled 2015-02-25: qty 5

## 2015-03-03 ENCOUNTER — Telehealth: Payer: Self-pay | Admitting: *Deleted

## 2015-03-04 NOTE — Telephone Encounter (Signed)
Received phone call from patient concerning participation in research study. Please see Consent form Encounter note dated 02/25/15 for details of call.

## 2015-03-10 ENCOUNTER — Other Ambulatory Visit: Payer: Self-pay | Admitting: *Deleted

## 2015-03-10 DIAGNOSIS — C569 Malignant neoplasm of unspecified ovary: Secondary | ICD-10-CM

## 2015-03-12 ENCOUNTER — Inpatient Hospital Stay: Payer: BLUE CROSS/BLUE SHIELD | Attending: Oncology

## 2015-03-12 DIAGNOSIS — Z006 Encounter for examination for normal comparison and control in clinical research program: Secondary | ICD-10-CM | POA: Insufficient documentation

## 2015-03-12 DIAGNOSIS — C569 Malignant neoplasm of unspecified ovary: Secondary | ICD-10-CM | POA: Diagnosis present

## 2015-03-12 DIAGNOSIS — Z9221 Personal history of antineoplastic chemotherapy: Secondary | ICD-10-CM | POA: Diagnosis not present

## 2015-03-13 LAB — CA 125: CA 125: 10.7 U/mL (ref 0.0–38.1)

## 2015-03-25 ENCOUNTER — Inpatient Hospital Stay: Payer: BLUE CROSS/BLUE SHIELD

## 2015-04-02 ENCOUNTER — Telehealth: Payer: Self-pay | Admitting: *Deleted

## 2015-04-02 NOTE — Telephone Encounter (Signed)
In attempt to lighten md schedule, contacted patient to see if she would be willing to come on 04/08/15 instead of 04/15/15. She could see Dr. Fransisca Connors. The patient states that she would rather see Dr. [name] instead on 04/15/15.

## 2015-04-15 ENCOUNTER — Inpatient Hospital Stay: Payer: BLUE CROSS/BLUE SHIELD | Attending: Obstetrics and Gynecology | Admitting: Obstetrics and Gynecology

## 2015-04-15 ENCOUNTER — Encounter: Payer: Self-pay | Admitting: Obstetrics and Gynecology

## 2015-04-15 ENCOUNTER — Inpatient Hospital Stay: Payer: BLUE CROSS/BLUE SHIELD

## 2015-04-15 VITALS — BP 120/78 | HR 85 | Temp 97.3°F | Resp 16 | Wt 127.6 lb

## 2015-04-15 DIAGNOSIS — Z8543 Personal history of malignant neoplasm of ovary: Secondary | ICD-10-CM | POA: Diagnosis not present

## 2015-04-15 DIAGNOSIS — Z9221 Personal history of antineoplastic chemotherapy: Secondary | ICD-10-CM | POA: Diagnosis not present

## 2015-04-15 DIAGNOSIS — Z90722 Acquired absence of ovaries, bilateral: Secondary | ICD-10-CM | POA: Diagnosis not present

## 2015-04-15 DIAGNOSIS — C569 Malignant neoplasm of unspecified ovary: Secondary | ICD-10-CM

## 2015-04-15 DIAGNOSIS — C801 Malignant (primary) neoplasm, unspecified: Secondary | ICD-10-CM

## 2015-04-15 DIAGNOSIS — Z452 Encounter for adjustment and management of vascular access device: Secondary | ICD-10-CM | POA: Insufficient documentation

## 2015-04-15 DIAGNOSIS — Z87891 Personal history of nicotine dependence: Secondary | ICD-10-CM | POA: Insufficient documentation

## 2015-04-15 DIAGNOSIS — K219 Gastro-esophageal reflux disease without esophagitis: Secondary | ICD-10-CM | POA: Diagnosis not present

## 2015-04-15 MED ORDER — HEPARIN SOD (PORK) LOCK FLUSH 100 UNIT/ML IV SOLN
500.0000 [IU] | Freq: Once | INTRAVENOUS | Status: AC
  Administered 2015-04-15: 500 [IU] via INTRAVENOUS
  Filled 2015-04-15: qty 5

## 2015-04-15 MED ORDER — SODIUM CHLORIDE 0.9 % IJ SOLN
10.0000 mL | Freq: Once | INTRAMUSCULAR | Status: AC
  Administered 2015-04-15: 10 mL via INTRAVENOUS
  Filled 2015-04-15: qty 10

## 2015-04-15 NOTE — Progress Notes (Signed)
Gynecologic Oncology Interval Note  Referring Provider: Dr. Forest Gleason, Dr. 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 She is on GOG225.   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 See prior notes for complete details. She was taken emergently to the operating room on 04/28/2014 by Dr. Glennon Mac and a laparoscopic procedure was performed.  Bilateral salpingo-oophorectomy with  placement of the cystic lesions in the Endo Catch bag and removal through the LLQ port. Per the operative note the right ovary did rupture and contents of the cysts were spilled into the pelvis.  The final pathology revealed high-grade serous adenocarcinoma involving both ovaries and fallopian tubes. Washings also revealed clusters of highly atypical glandular cells and surface involvement of the ovary was present. Staging was felt to be stage II at least high grade serous ovarian cancer. Preoperative CA125 was 45.   She received 6 cycles of carboplatin/taxane chemotherapy via an IV port, last treatment June 2016.  She had an allergy to taxol and was switched to taxotere for the last 4 cycles. She underwent interval TLH/omentectomy on 10/29/2014 for ovarian cancer. Her final pathology was negative. She enrolled on GOG225.   Genetic testing was negative.   Problem List: Patient Active Problem List   Diagnosis Date Noted  . Abdominal pain 11/26/2014  . Status post laparoscopic hysterectomy 10/29/2014  . Ovarian cancer ([place]) 07/23/2014  . Change in bowel habits 03/04/2014  . Laceration of hand,  right 08/19/2013  . Allergic conjunctivitis and rhinitis 07/04/2012  . GERD (gastroesophageal reflux disease) 05/01/2007  . PSORIASIS NEC 12/07/2006    Past Medical History: Past Medical History  Diagnosis Date  . Dizziness     vertigo  . GERD (gastroesophageal reflux disease)   . Anxiety   . Gallstones   . Benign fundic gland polyps of stomach   . Arthritis   . Ovarian cancer [facility])     Past Surgical History: Past Surgical History  Procedure Laterality Date  . Cholecystectomy    . Cesarean section    . Laparoscopic oopherectomy    . Colonoscopy    . Upper gi endoscopy    . Oophorectomy    . Laparoscopic hysterectomy N/A 10/29/2014    Procedure: HYSTERECTOMY TOTAL LAPAROSCOPIC, OMENTUMECTOMY;  Surgeon: Gillis Ends, MD;  Location: [facility] ORS;  Service: Gynecology;  Laterality: N/A;    Family History: Family History  Problem Relation Age of Onset  . Hypertension Mother   . Hyperlipidemia Mother   . GER disease Mother   . Heart disease Paternal Grandmother   . Heart disease Father   . Breast cancer Paternal Grandmother   . Kidney disease Neg Hx   . Liver disease Neg Hx   . Colon cancer Neg Hx   . Colon polyps Neg Hx   . Esophageal cancer Neg Hx   . Pancreatic cancer Neg Hx     Social History: Social History   Social History  . Marital Status: Married    Spouse Name: N/A  . Number of Children: 1  . Years of Education: N/A   Occupational History  .  Not on file.   Social History Main Topics  . Smoking status: Former Research scientist (life sciences)  . Smokeless tobacco: Never Used  . Alcohol Use: 0.0 oz/week    0 Standard drinks or equivalent per week     Comment: 1 drink daily  . Drug Use: No  . Sexual Activity: Not on file   Other Topics Concern  . Not on file   Social History Narrative    Allergies: Allergies  Allergen Reactions  . Ciprofloxacin Anaphylaxis  . Paclitaxel Shortness Of Breath and Other (See Comments)    Other reaction(s): Tight chest  (finding) Chest and facial flushing Flushing, chest tightness, SOB w/ Taxol on 05/26/14 at [place].    Current Medications: Current Outpatient Prescriptions  Medication Sig Dispense Refill  . acetaminophen (TYLENOL) 500 MG tablet Take 2 tablets (1,000 mg total) by mouth every 6 (six) hours. (Patient taking differently: Take 1,000 mg by mouth every 6 (six) hours as needed for mild pain, moderate pain, fever or headache. ) 30 tablet 0  . citalopram (CELEXA) 20 MG tablet Take 1 tablet (20 mg total) by mouth daily. (Patient taking differently: Take 20 mg by mouth at bedtime. ) 30 tablet 3  . omeprazole (PRILOSEC) 40 MG capsule Take 1 capsule (40 mg total) by mouth daily before breakfast. 30 capsule 11  . lidocaine (LIDODERM) 5 % Place 1 patch onto the skin daily. Remove & Discard patch within 12 hours or as directed by MD (Patient not taking: Reported on 02/24/2015) 30 patch 0  . LORazepam (ATIVAN) 1 MG tablet Take 1 tablet (1 mg total) by mouth 2 (two) times daily. (Patient not taking: Reported on 04/15/2015) 20 tablet 0  . ondansetron (ZOFRAN) 4 MG tablet Take 1 tablet (4 mg total) by mouth daily as needed for nausea or vomiting. (Patient not taking: Reported on 04/15/2015) 20 tablet 1   No current facility-administered medications for this visit.   Facility-Administered Medications Ordered in Other Visits  Medication Dose Route Frequency Provider Last Rate Last Dose  . sodium chloride 0.9 % injection 10 mL  10 mL Intracatheter PRN Forest Gleason, MD   10 mL at 08/04/14 1130  . sodium chloride 0.9 % injection 10 mL  10 mL Intravenous PRN Forest Gleason, MD   10 mL at 11/19/14 1628    Genetic Testing: negative   Review of Systems General: fatigue, weakness  HEENT: no complaints  Lungs: SOB with exertion  Cardiac: no complaints  GI: negative  GU: no complaints  Musculoskeletal: no complaints  Extremities: no complaints  Skin: no complaints  Neuro: peripheral neuropathy  Endocrine: no  complaints  Psych: no complaints       Objective:   Filed Vitals:   04/15/15 1011  BP: 120/78  Pulse: 85  Temp: 97.3 F (36.3 C)  Resp: 16    Body mass index is 23.34 kg/(m^2).  ECOG Performance Status: 0 - Asymptomatic  General appearance: alert, cooperative and appears stated age HEENT:PERRLA, extra ocular movement intact, sclera clear, anicteric CV: RRR Lungs: B CTA Lymph node survey: Negative for axillary, supraclavicular or inguinal adenopathy Abdomen: soft, nontender and nondistended. No hernias, masses, infection or ascites and incisions well healed.  Extremities: extremities normal, atraumatic, no cyanosis or edema Neurological exam reveals alert, oriented, normal speech, no focal findings or movement disorder noted.  Pelvic: exam chaperoned by nurse;  Vulva: normal appearing vulva with no masses, tenderness or lesions; Vagina: normal vagina and cuff healed; BME: nontender and no masses; UterusCervix:  surgically absent. Rectal: no masses  Lab Results  Component Value Date   CA125 10.7 03/12/2015   CA125 10.4 02/11/2015   CA125 30.8 11/19/2014    Assessment:  Debbie Richardson is a 55 y.o. female with a history of stage II at least high grade serous ovarian cancer. Status post LS BSO and 6 cycles of platin/taxane chemotherapy.  No evidence of disease on CT or exam and CA125 normal.  Interval TLH/Omentectomy 7/16 negative for malignancy.  Fatigue, weakness, and SOB with exertion most likely secondary to deconditioning.    Plan:   Problem List Items Addressed This Visit      Genitourinary   Ovarian cancer ([place]) - Primary (Chronic)     She is on GOG225 and we need to determine how to alternative surveillance visits. I spoke with Sharyne Richters today and she will reach out to the chair of the protocol to determine if pelvic exams need to be performed each visit.   She is going to see Dr. Oliva Bustard for her mammogram and breast exam next visit when she sees him in  March 2017.  Gillis Ends, MD  CC:  Dr. Prentice Docker  Dr. Blain Pais

## 2015-04-16 ENCOUNTER — Telehealth: Payer: Self-pay | Admitting: *Deleted

## 2015-04-16 NOTE — Telephone Encounter (Signed)
04/16/2015 @11 :05am  T/C made to Debbie Richardson to discuss upcoming appointments. Patient saw Dr. [name] yesterday for GYN exam and there was some question regarding whether she would need a pelvic exam every 3 months per GOG protocol. Inquiry made to [id number] study chair, and received response from GOG stating that the pelvic exams could be performed per our institution guidelines or as medically necessary. Dr. Oliva Bustard and Dr. [name] informed of this. Debbie Richardson is to be scheduled for return visit with Dr. Oliva Bustard on 06/10/15 for H&P and breast exam. She has also requested to move her port flush appt to this day if possible. Dr. Oliva Bustard is to schedule her annual mammogram; however he states he wants to see the patient first and perform a clinical breast exam prior to scheduling the mammogram. Debbie Richardson informed of this and is in agreement. Her 6 month study appointment will be scheduled on 09/09/15 and she will be seen by both Dr. Oliva Bustard for H&P and Dr. [name] for GYN exam. Patient requests all appointments be scheduled on Wednesdays because she works from home on this day. Will schedule appointments for patient and have them mailed to her at her request.  KSS

## 2015-05-06 ENCOUNTER — Inpatient Hospital Stay: Payer: BLUE CROSS/BLUE SHIELD

## 2015-05-18 ENCOUNTER — Other Ambulatory Visit: Payer: Self-pay | Admitting: *Deleted

## 2015-05-18 NOTE — Telephone Encounter (Addendum)
This was changed by GI from Prilosec to Protonix and she wants oncology input on the fact that she is even needing to take this med again since had stopped taking it at all and now has reflux. She specifically requests that [facility] return her call

## 2015-05-18 NOTE — Telephone Encounter (Signed)
States since [place] has had flare-up of reflux. Pt wishes to know what she needs to do first. Instructed pt to continue with prilosec and contact GI doctor that has previously prescribed medication for further evaluation regarding reflux. Pt has appt in march with Dr. Oliva Bustard and will follow up with acid reflux at that time to see if any more intervention is necessary. Pt verbalized understanding.

## 2015-05-25 ENCOUNTER — Ambulatory Visit (INDEPENDENT_AMBULATORY_CARE_PROVIDER_SITE_OTHER): Payer: BLUE CROSS/BLUE SHIELD | Admitting: Family Medicine

## 2015-05-25 ENCOUNTER — Encounter: Payer: Self-pay | Admitting: Family Medicine

## 2015-05-25 VITALS — BP 112/66 | HR 100 | Temp 98.3°F | Ht 62.0 in | Wt 125.5 lb

## 2015-05-25 DIAGNOSIS — C569 Malignant neoplasm of unspecified ovary: Secondary | ICD-10-CM | POA: Diagnosis not present

## 2015-05-25 DIAGNOSIS — K219 Gastro-esophageal reflux disease without esophagitis: Secondary | ICD-10-CM

## 2015-05-25 DIAGNOSIS — Z23 Encounter for immunization: Secondary | ICD-10-CM

## 2015-05-25 MED ORDER — OMEPRAZOLE 40 MG PO CPDR: 40.0000 mg | DELAYED_RELEASE_CAPSULE | Freq: Every day | ORAL | Status: DC

## 2015-05-25 NOTE — Assessment & Plan Note (Addendum)
S/p 2 surgeries and chemo  Now in a healthy lifestyle study Doing well physically and emotionally- very positive attitude Will continue specialty f/u  For mammogram and breast exam next mo

## 2015-05-25 NOTE — Assessment & Plan Note (Signed)
Refilled omeprazole 40 mg daily  Disc diet-info on diet for gerd given Good health habits Will try to get off of in the future likely

## 2015-05-25 NOTE — Progress Notes (Signed)
Subjective:    Patient ID: Debbie Richardson, female    DOB: 05-07-1960, 55 y.o.   MRN: [id number]  HPI  Here for f/u   Has had TLH and omenectomy after dx with ovarian cancer  Had chemo Now in study for GOG22-- has to do with lifestyle changes , enc activity and healthy eating and reducing stress  She is eating more fruits and veg  CT neg 10/16  CA 125 is down   Not seeing a counselor  Back at work full time (that is good and bad) Doing yoga  Looking into a meditation class   Her heartburn came back - probably due to stress  Was good for a while  On ranitidine 150  Ran out of prilosec 40  The inc fruit and veg is upsetting stomach a bit more -getting used to it   Has heartburn Also a bubble feeling  Not as bad as it used to be  A little soreness-epigastric  No bowel changes No blood in stool or dark stool   Wt is stable bmi of 22   Wants flu shot today  Will be seeing Dr Oliva Bustard - in March for labs and f/u and mammogram    Does not have [place] gene    Patient Active Problem List   Diagnosis Date Noted  . Abdominal pain 11/26/2014  . Status post laparoscopic hysterectomy 10/29/2014  . Ovarian cancer ([place]) 07/23/2014  . Change in bowel habits 03/04/2014  . Laceration of hand, right 08/19/2013  . Allergic conjunctivitis and rhinitis 07/04/2012  . GERD (gastroesophageal reflux disease) 05/01/2007  . PSORIASIS NEC 12/07/2006   Past Medical History  Diagnosis Date  . Dizziness     vertigo  . GERD (gastroesophageal reflux disease)   . Anxiety   . Gallstones   . Benign fundic gland polyps of stomach   . Arthritis   . Ovarian cancer [facility])    Past Surgical History  Procedure Laterality Date  . Cholecystectomy    . Cesarean section    . Laparoscopic oopherectomy    . Colonoscopy    . Upper gi endoscopy    . Oophorectomy    . Laparoscopic hysterectomy N/A 10/29/2014    Procedure: HYSTERECTOMY TOTAL LAPAROSCOPIC, OMENTUMECTOMY;  Surgeon: Gillis Ends, MD;  Location: [facility] ORS;  Service: Gynecology;  Laterality: N/A;   Social History  Substance Use Topics  . Smoking status: Former Research scientist (life sciences)  . Smokeless tobacco: Never Used  . Alcohol Use: 0.0 oz/week    0 Standard drinks or equivalent per week     Comment: 1 drink daily   Family History  Problem Relation Age of Onset  . Hypertension Mother   . Hyperlipidemia Mother   . GER disease Mother   . Heart disease Paternal Grandmother   . Heart disease Father   . Breast cancer Paternal Grandmother   . Kidney disease Neg Hx   . Liver disease Neg Hx   . Colon cancer Neg Hx   . Colon polyps Neg Hx   . Esophageal cancer Neg Hx   . Pancreatic cancer Neg Hx    Allergies  Allergen Reactions  . Ciprofloxacin Anaphylaxis  . Paclitaxel Shortness Of Breath and Other (See Comments)    Other reaction(s): Tight chest (finding) Chest and facial flushing Flushing, chest tightness, SOB w/ Taxol on 05/26/14 at [place].   Current Outpatient Prescriptions on File Prior to Visit  Medication Sig Dispense Refill  . acetaminophen (TYLENOL) 500  MG tablet Take 2 tablets (1,000 mg total) by mouth every 6 (six) hours. (Patient taking differently: Take 1,000 mg by mouth every 6 (six) hours as needed for mild pain, moderate pain, fever or headache. ) 30 tablet 0  . omeprazole (PRILOSEC) 40 MG capsule Take 1 capsule (40 mg total) by mouth daily before breakfast. 30 capsule 11   Current Facility-Administered Medications on File Prior to Visit  Medication Dose Route Frequency Provider Last Rate Last Dose  . sodium chloride 0.9 % injection 10 mL  10 mL Intracatheter PRN Forest Gleason, MD   10 mL at 08/04/14 1130  . sodium chloride 0.9 % injection 10 mL  10 mL Intravenous PRN Forest Gleason, MD   10 mL at 11/19/14 1628     Review of Systems Review of Systems  Constitutional: Negative for fever, appetite change, fatigue and unexpected weight change.  Eyes: Negative for pain and visual disturbance.    Respiratory: Negative for cough and shortness of breath.   Cardiovascular: Negative for cp or palpitations    Gastrointestinal: Negative for nausea, diarrhea and constipation. pos for heartburn, neg for dark stool or blood in stool  Genitourinary: Negative for urgency and frequency.  Skin: Negative for pallor or rash   Neurological: Negative for weakness, light-headedness, numbness and headaches.  Hematological: Negative for adenopathy. Does not bruise/bleed easily.  Psychiatric/Behavioral: Negative for dysphoric mood. The patient is not nervous/anxious.         Objective:   Physical Exam  Constitutional: She appears well-developed and well-nourished. No distress.  Well appearing   HENT:  Head: Normocephalic and atraumatic.  Mouth/Throat: Oropharynx is clear and moist.  Eyes: Conjunctivae and EOM are normal. Pupils are equal, round, and reactive to light. No scleral icterus.  Neck: Normal range of motion. Neck supple.  Cardiovascular: Normal rate, regular rhythm and normal heart sounds.   Pulmonary/Chest: Effort normal and breath sounds normal. No respiratory distress. She has no wheezes. She has no rales.  Abdominal: Soft. Bowel sounds are normal. She exhibits no distension, no abdominal bruit, no pulsatile midline mass and no mass. There is no hepatosplenomegaly. There is tenderness in the epigastric area. There is no rebound, no guarding and no CVA tenderness.  Slight epigastric tenderness  Lymphadenopathy:    She has no cervical adenopathy.  Neurological: She is alert.  Skin: Skin is warm and dry. No erythema. No pallor.  Psychiatric: She has a normal mood and affect.  Cheerful and talkative           Assessment & Plan:   Problem List Items Addressed This Visit      Digestive   GERD (gastroesophageal reflux disease) - Primary    Refilled omeprazole 40 mg daily  Disc diet-info on diet for gerd given Good health habits Will try to get off of in the future likely          Relevant Medications   omeprazole (PRILOSEC) 40 MG capsule     Genitourinary   Ovarian cancer ([facility]) (Chronic)    S/p 2 surgeries and chemo  Now in a healthy lifestyle study Doing well physically and emotionally- very positive attitude Will continue specialty f/u  For mammogram and breast exam next mo        Other Visit Diagnoses    Need for influenza vaccination        Relevant Orders    Flu Vaccine QUAD 36+ mos PF IM (Fluarix & Fluzone Quad PF) (Completed)

## 2015-05-25 NOTE — Progress Notes (Signed)
Pre visit review using our clinic review tool, if applicable. No additional management support is needed unless otherwise documented below in the visit note. 

## 2015-05-25 NOTE — Patient Instructions (Signed)
Don't forget to get your mammogram in March Glad you are doing well  F/u shot today   Check out the [employer] -he writes about meditation and mindfulness - very interesting and helpful  Do yoga-that is great!  Get back on your omeprazole If heartburn does not improve -please let me know

## 2015-06-08 ENCOUNTER — Other Ambulatory Visit: Payer: Self-pay | Admitting: *Deleted

## 2015-06-08 DIAGNOSIS — C569 Malignant neoplasm of unspecified ovary: Secondary | ICD-10-CM

## 2015-06-10 ENCOUNTER — Other Ambulatory Visit: Payer: Self-pay | Admitting: *Deleted

## 2015-06-10 ENCOUNTER — Inpatient Hospital Stay (HOSPITAL_BASED_OUTPATIENT_CLINIC_OR_DEPARTMENT_OTHER): Payer: BLUE CROSS/BLUE SHIELD | Admitting: Oncology

## 2015-06-10 ENCOUNTER — Inpatient Hospital Stay: Payer: BLUE CROSS/BLUE SHIELD

## 2015-06-10 ENCOUNTER — Encounter: Payer: Self-pay | Admitting: Oncology

## 2015-06-10 ENCOUNTER — Inpatient Hospital Stay: Payer: BLUE CROSS/BLUE SHIELD | Attending: Oncology

## 2015-06-10 ENCOUNTER — Encounter: Payer: Self-pay | Admitting: *Deleted

## 2015-06-10 DIAGNOSIS — Z90710 Acquired absence of both cervix and uterus: Secondary | ICD-10-CM | POA: Insufficient documentation

## 2015-06-10 DIAGNOSIS — Z006 Encounter for examination for normal comparison and control in clinical research program: Secondary | ICD-10-CM | POA: Insufficient documentation

## 2015-06-10 DIAGNOSIS — Z87891 Personal history of nicotine dependence: Secondary | ICD-10-CM

## 2015-06-10 DIAGNOSIS — Z90722 Acquired absence of ovaries, bilateral: Secondary | ICD-10-CM | POA: Insufficient documentation

## 2015-06-10 DIAGNOSIS — Z8543 Personal history of malignant neoplasm of ovary: Secondary | ICD-10-CM

## 2015-06-10 DIAGNOSIS — Z800 Family history of malignant neoplasm of digestive organs: Secondary | ICD-10-CM

## 2015-06-10 DIAGNOSIS — K219 Gastro-esophageal reflux disease without esophagitis: Secondary | ICD-10-CM | POA: Insufficient documentation

## 2015-06-10 DIAGNOSIS — F419 Anxiety disorder, unspecified: Secondary | ICD-10-CM | POA: Insufficient documentation

## 2015-06-10 DIAGNOSIS — Z9221 Personal history of antineoplastic chemotherapy: Secondary | ICD-10-CM | POA: Insufficient documentation

## 2015-06-10 DIAGNOSIS — Z803 Family history of malignant neoplasm of breast: Secondary | ICD-10-CM | POA: Diagnosis not present

## 2015-06-10 DIAGNOSIS — C569 Malignant neoplasm of unspecified ovary: Secondary | ICD-10-CM

## 2015-06-10 DIAGNOSIS — R1032 Left lower quadrant pain: Secondary | ICD-10-CM

## 2015-06-10 DIAGNOSIS — Z9049 Acquired absence of other specified parts of digestive tract: Secondary | ICD-10-CM | POA: Diagnosis not present

## 2015-06-10 DIAGNOSIS — Z79899 Other long term (current) drug therapy: Secondary | ICD-10-CM | POA: Insufficient documentation

## 2015-06-10 DIAGNOSIS — Z808 Family history of malignant neoplasm of other organs or systems: Secondary | ICD-10-CM

## 2015-06-10 LAB — CBC WITH DIFFERENTIAL/PLATELET
Basophils Absolute: 0 10*3/uL (ref 0–0.1)
Basophils Relative: 1 %
EOS PCT: 0 %
Eosinophils Absolute: 0 10*3/uL (ref 0–0.7)
HCT: 38.1 % (ref 35.0–47.0)
Hemoglobin: 13.6 g/dL (ref 12.0–16.0)
LYMPHS ABS: 1.2 10*3/uL (ref 1.0–3.6)
LYMPHS PCT: 25 %
MCH: 32.5 pg (ref 26.0–34.0)
MCHC: 35.8 g/dL (ref 32.0–36.0)
MCV: 90.7 fL (ref 80.0–100.0)
MONO ABS: 0.2 10*3/uL (ref 0.2–0.9)
MONOS PCT: 4 %
NEUTROS ABS: 3.4 10*3/uL (ref 1.4–6.5)
NEUTROS PCT: 70 %
PLATELETS: 159 10*3/uL (ref 150–440)
RBC: 4.2 MIL/uL (ref 3.80–5.20)
RDW: 12.7 % (ref 11.5–14.5)
WBC: 4.9 10*3/uL (ref 3.6–11.0)

## 2015-06-10 LAB — COMPREHENSIVE METABOLIC PANEL
ALT: 18 U/L (ref 14–54)
AST: 19 U/L (ref 15–41)
Albumin: 4.7 g/dL (ref 3.5–5.0)
Alkaline Phosphatase: 62 U/L (ref 38–126)
Anion gap: 5 (ref 5–15)
BUN: 14 mg/dL (ref 6–20)
CHLORIDE: 105 mmol/L (ref 101–111)
CO2: 25 mmol/L (ref 22–32)
CREATININE: 0.66 mg/dL (ref 0.44–1.00)
Calcium: 8.9 mg/dL (ref 8.9–10.3)
Glucose, Bld: 162 mg/dL — ABNORMAL HIGH (ref 65–99)
Potassium: 3.5 mmol/L (ref 3.5–5.1)
Sodium: 135 mmol/L (ref 135–145)
Total Bilirubin: 0.9 mg/dL (ref 0.3–1.2)
Total Protein: 7.4 g/dL (ref 6.5–8.1)

## 2015-06-10 MED ORDER — METRONIDAZOLE 500 MG PO TABS: 500.0000 mg | ORAL_TABLET | Freq: Three times a day (TID) | ORAL | Status: DC

## 2015-06-10 MED ORDER — SODIUM CHLORIDE 0.9% FLUSH
10.0000 mL | INTRAVENOUS | Status: DC | PRN
  Filled 2015-06-10: qty 10

## 2015-06-10 MED ORDER — HEPARIN SOD (PORK) LOCK FLUSH 100 UNIT/ML IV SOLN
500.0000 [IU] | Freq: Once | INTRAVENOUS | Status: AC
  Administered 2015-06-10: 500 [IU] via INTRAVENOUS

## 2015-06-10 MED ORDER — SODIUM CHLORIDE 0.9% FLUSH
10.0000 mL | INTRAVENOUS | Status: DC | PRN
  Administered 2015-06-10: 10 mL via INTRAVENOUS
  Filled 2015-06-10: qty 10

## 2015-06-10 MED ORDER — HEPARIN SOD (PORK) LOCK FLUSH 100 UNIT/ML IV SOLN
500.0000 [IU] | Freq: Once | INTRAVENOUS | Status: DC
  Filled 2015-06-10: qty 5

## 2015-06-10 NOTE — Progress Notes (Signed)
Debbie Richardson returns to clinic this morning for her 3 month follow up visit for the GOG 0225 research study. Reports doing well until about 6 weeks ago when she had a bout of severe heartburn.States she had run out of medication, but did see her GI physician and is now back on the Prilosec and heartburn is under control. Also states she has been experiencing pain in her left pelvic area; which she describes as a dull aching, for about 4 weeks and this is affecting her appetite.  [name] reports she is concerned that she is experiencing some diverticulitis, and that may be causing her pain. She states heat helps it as well as walking, but does relate it to the Prilosec and states it feels like she has air in her stomach. Dr. Oliva Bustard in to examine patient and she also reports some continued tingling in tips of fingers, which is an ongoing AE related to her prior chemotherapy. She also reports occasional constipation, but indicates this is not a new problem. Dr. Oliva Bustard is prescribing Flagyl 500mg  po every 8 hours for 7 days to see of the left pelvic pain improves. He informed the patient that if her pain is due to diverticulitis, it will likely improve, but if it does not, he will order a CT scan since she has a history of ovarian cancer. He also informed her that if her CA125 is elevated, he will order a CT scan as well. Patient's weight is actually up 2 lbs since entering the study, however her waist circumference is 30.5 inches today - which is down 1/2 inch in the past 3 months.  States she was eating more nuts and high fiber foods, but has eliminated nuts from her diet altogether and trying to choose foods that do not cause GI upset. All reported AE's are likely unrelated to the study since she is not on the intervention arm; however any or all of the GI symptoms could be related to dietary changes. See grade and attribution below.                                                                                                                                                                                   BARABARA STAYTON [id number]  06/10/2015  Adverse Event Log  Study/Protocol: GOG 0225 Cycle: Month 3 visit  Event Grade Onset Date Resolved Date Drug Name Attribution Treatment Comments  "Severe" heartburn (dyspepsia) Grade 2 04/29/15 05/25/15 n/a Unrelated Prilosec resumed Prescription for Prilosec ran out  Left pelvic pain Grade 2 04/26/15  n/a Unlikely Flagyl Pt feels due to diverticulitis  Tingling in fingertips Grade 1 unknown  n/a Unrelated none From  prior taxane tx  Occasional constipation Grade 1 unknown  n/a Unlikely none    [name], BSN, [place], [facility] 06/10/2015 12:23 PM

## 2015-06-10 NOTE — Progress Notes (Signed)
[place] @ [facility] [phone]  Fax:([phone] [place] OB: 12/20/1960  MR#: [id number]  [id number]  Patient Care Team: [name], MD as PCP - General  CHIEF COMPLAINT:  Chief Complaint  Patient presents with  . 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 patient had severe allergic reaction to Taxol.  Given during desensitizing protocol with Taxol patient broke out in hives.  Patient is here for initiation of chemotherapy with carboplatinum and Taxotere (June 02, 2014) 2.  Finished total 6 cycles of chemotherapy with carboplatinum and Taxotere June of 2016 3.  Patient underwent exploratory laparotomy bilateral oophorectomy and omentectomy in July of 2016   No evidence of malignancy was found.         INTERVAL HISTORY: 55 year old lady came today with a history of stage III ovarian cancer.  This is the fifth cycle of chemotherapy.  Tolerating treatment very well.  No chills.  No fever.  No rash.  No tingling numbness. Patient is under GOG protocol for lifestyle study.  Being evaluated today by the research nurses. Tonia Brooms he has resolved.  Tingling numbness is getting better.  Patient started having some left lower quadrant pain in between patient had epigastric discomfort after she ran out of H2 blocker medication.  Patient was seen by primary care physician back on Prilosec with good relief in epigastric discomfort.  Patient then started left lower quadrant pain no chills.  No fever.  No diarrhea.  REVIEW OF SYSTEMS:   ROS  general status: Patient is feeling weak and tired.  No change in a performance status.  No chills.  No fever. HEENT: .  No evidence of stomatitis Lungs: No cough or shortness of breath Cardiac: No chest pain or paroxysmal nocturnal dyspnea GI:As described about has  epigastric discomfort which has improved now having left lower quadrant pain  Skin: No rash Lower extremity no swelling Neurological system:Tingling numbness has improvedl system no bony pains All other systems have been reviewed and no other abnormality was found  PAST MEDICAL HISTORY: Past Medical History  Diagnosis Date  . Dizziness     vertigo  . GERD (gastroesophageal reflux disease)   . Anxiety   . Gallstones   . Benign fundic gland polyps of stomach   . Arthritis   . Ovarian cancer ([place])     PAST SURGICAL HISTORY: Past Surgical History  Procedure Laterality Date  . Cholecystectomy    . Cesarean section    . Laparoscopic oopherectomy    . Colonoscopy    . Upper gi endoscopy    . Oophorectomy    . Laparoscopic hysterectomy N/A 10/29/2014    Procedure: HYSTERECTOMY TOTAL LAPAROSCOPIC, OMENTUMECTOMY;  Surgeon: Gillis Ends, MD;  Location: [facility] ORS;  Service: Gynecology;  Laterality: N/A;    FAMILY HISTORY Family History  Problem Relation Age of Onset  . Hypertension Mother   . Hyperlipidemia Mother   . GER disease Mother   . Heart disease Paternal Grandmother   . Heart disease Father   . Breast cancer Paternal Grandmother   . Kidney disease Neg Hx   . Liver disease Neg Hx   . Colon cancer Neg Hx   . Colon polyps Neg Hx   . Esophageal cancer Neg Hx   . Pancreatic cancer Neg  Hx         ADVANCED DIRECTIVES: Does not have advance care directive.. Information has been given.  Appointment with chaplain has been discussed with the patient and family   HEALTH MAINTENANCE: Social History  Substance Use Topics  . Smoking status: Former Research scientist (life sciences)  . Smokeless tobacco: Never Used  . Alcohol Use: 0.0 oz/week    0 Standard drinks or equivalent per week     Comment: 1 drink daily      Allergies  Allergen Reactions  . Ciprofloxacin Anaphylaxis  . Paclitaxel Shortness Of Breath and Other (See Comments)    Other reaction(s): Tight chest (finding) Chest  and facial flushing Flushing, chest tightness, SOB w/ Taxol on 05/26/14 at [place].    Current Outpatient Prescriptions  Medication Sig Dispense Refill  . acetaminophen (TYLENOL) 500 MG tablet Take 2 tablets (1,000 mg total) by mouth every 6 (six) hours. (Patient taking differently: Take 1,000 mg by mouth every 6 (six) hours as needed for mild pain, moderate pain, fever or headache. ) 30 tablet 0  . omeprazole (PRILOSEC) 40 MG capsule Take 1 capsule (40 mg total) by mouth daily before breakfast. 30 capsule 11  . metroNIDAZOLE (FLAGYL) 500 MG tablet Take 1 tablet (500 mg total) by mouth every 8 (eight) hours. 21 tablet 0   No current facility-administered medications for this visit.   Facility-Administered Medications Ordered in Other Visits  Medication Dose Route Frequency Provider Last Rate Last Dose  . sodium chloride 0.9 % injection 10 mL  10 mL Intracatheter PRN Forest Gleason, MD   10 mL at 08/04/14 1130  . sodium chloride 0.9 % injection 10 mL  10 mL Intravenous PRN Forest Gleason, MD   10 mL at 11/19/14 1628    OBJECTIVE: Filed Vitals:   06/10/15 1039  BP: 115/79  Pulse: 90  Temp: 98.2 F (36.8 C)  Resp: 18     Body mass index is 22.7 kg/(m^2).    ECOG FS:0 - Asymptomatic  Physical Exam GENERAL:  Well developed, well nourished, sitting comfortably in the exam room in no acute distress. MENTAL STATUS:  Alert and oriented to person, place and time. HEAD:  Alopecia  Normocephalic, atraumatic, face symmetric, no Cushingoid features. EYES:  .  Pupils equal round and reactive to light and accomodation.  No conjunctivitis or scleral icterus. ENT:  Oropharynx clear without lesion.  Tongue normal. Mucous membranes moist.  RESPIRATORY:  Clear to auscultation without rales, wheezes or rhonchi. CARDIOVASCULAR:  Regular rate and rhythm without murmur, rub or gallop.  ABDOMEN:  Soft, non-tender, with active bowel sounds, and no hepatosplenomegaly.  No masses.Tenderness in the left  lower quadrant.  No guarding no rigidity  BACK:  No CVA tenderness.  No tenderness on percussion of the back or rib cage. SKIN:  No rashes, ulcers or lesions. EXTREMITIES: No edema, no skin discoloration or tenderness.  No palpable cords. LYMPH NODES: No palpable cervical, supraclavicular, axillary or inguinal adenopathy  NEUROLOGICAL: Unremarkable. PSYCH:  Appropriate.  LAB RESULTS:   No results found for: SPEP, UPEP  Lab Results  Component Value Date   WBC 4.9 06/10/2015   NEUTROABS 3.4 06/10/2015   HGB 13.6 06/10/2015   HCT 38.1 06/10/2015   MCV 90.7 06/10/2015   PLT 159 06/10/2015      Chemistry       Component Value Date/Time   CALCIUM 8.9 06/10/2015 0911   CALCIUM 9.1 07/14/2014 0955   ALKPHOS 62 06/10/2015 0911   ALKPHOS 45  07/14/2014 0955   AST 19 06/10/2015 0911   AST 24 07/14/2014 0955   ALT 18 06/10/2015 0911   ALT 20 07/14/2014 0955   BILITOT 0.9 06/10/2015 0911   BILITOT 0.7 07/14/2014 0955          Lab Results  Component Value Date   CA125 10.7 03/12/2015    ASSESSMENT: Carcinoma of ovary stage IIb status post initial surgery  Patient finished all 6 cycles of chemotherapy now on GOG protocol for lifestyle evaluation Today being evaluated by our research nurses. CA-125 is pending  Left lower quadrant pain patient was given Flagyl with presumed diagnosis of diverticulitis.  If pain persists or CA-125 is elevated then CT scan of abdomen can be done.  Central inform us in next 10 days about left lower quadrant pain  PLAN:  Patient  is on GOG protocol 205. She will be evaluated by Dr. [name] as well as by primary care physician Dr. Glori Bickers Due to my planned retirement next appointment patient will be evaluated by Dr. Jacinto Reap Discuss that with research nurses and they are aware of that as well as with the patient   Patient expressed understanding and was in agreement with this plan. She also understands that She can call clinic at any time with any  questions, concerns, or complaints.    Ovarian cancer   Staging form: Ovary, AJCC 7th Edition     Clinical stage from 07/23/2014: Stage Unknown (yT2c, NX, M0) - Signed by Evlyn Kanner, NP on 07/23/2014   Forest Gleason, MD   06/10/2015 1:29 PM

## 2015-06-10 NOTE — Progress Notes (Signed)
Patient states she has a tender area in her left abdomen.  She has noted bloating and bowel changes.  Feels like a lot of air and bloating. Further states she has had some heartburn as well.

## 2015-06-11 ENCOUNTER — Telehealth: Payer: Self-pay | Admitting: *Deleted

## 2015-06-11 LAB — CA 125: CA 125: 10 U/mL (ref 0.0–38.1)

## 2015-06-11 NOTE — Telephone Encounter (Signed)
T/C made to [facility]

## 2015-06-11 NOTE — Telephone Encounter (Signed)
T/C made to patient this morning to let her know that her CA125 result was 10.0. She verbalized relief to hear that, but then had questions about her pain and states Dr. Oliva Bustard had mentioned her "kidneys" during her exam yesterday and she had not considered the possibility of a kidney stone. Reports she has had some occasional flank pain along with the pelvic pain and questions whether she should see a urologist. Recommended that she go ahead and complete the antibiotic that Dr. Oliva Bustard prescribed and if the pain has not resolved, he will order a CT scan and this should show whether she has a kidney stone as well. Also instructed that if the pain becomes worse she should call myself or Dr. Oliva Bustard for a quicker intervention. Ms. Hauri also states she did not get an appointment for her mammogram; which was ordered by Dr. Oliva Bustard yesterday. She plans to call [facility] and see whether it has been scheduled yet - but there is no mammogram apptointment currently showing in EPIC. Will follow up with schedulers to determine if this is being scheduled as requested. [name], BSN, MHA, OCN 06/11/2015 8:57 AM

## 2015-06-15 ENCOUNTER — Telehealth: Payer: Self-pay | Admitting: *Deleted

## 2015-06-15 NOTE — Telephone Encounter (Signed)
T/C made to Debbie Richardson to make sure she had received her mammogram appointment. She states she saw it in her "My Chart" last week. Verified that it is next Wednesday - 06/24/15 @2 :40pm. Ms. Frankum reports she had to stop taking the Flagyl on Saturday because it gave her a "splitting headache" and she looked at the side effect profile and headache was included. Patient reports that the pain in her left pelvis has improved tremendously, and she feels she does have some diverticulitis. Ms. Bartolomeo reports she is going to try to get back in to see her GI physician to discuss changing her diet to see if she can control the diverticulitis symptoms that way. Will inform Dr. Oliva Bustard that the patient's symptoms have improved and that she took her last Flagyl on 06/13/15 before stopping it due to headaches. Also reports the headache went away after she stopped the Flagyl. [name], BSN, MHA, OCN 06/15/2015 4:08 PM

## 2015-06-22 ENCOUNTER — Encounter: Payer: Self-pay | Admitting: Family Medicine

## 2015-06-22 ENCOUNTER — Ambulatory Visit (INDEPENDENT_AMBULATORY_CARE_PROVIDER_SITE_OTHER): Payer: BLUE CROSS/BLUE SHIELD | Admitting: Family Medicine

## 2015-06-22 VITALS — BP 112/78 | HR 78 | Temp 97.8°F | Ht 62.0 in | Wt 127.5 lb

## 2015-06-22 DIAGNOSIS — K219 Gastro-esophageal reflux disease without esophagitis: Secondary | ICD-10-CM | POA: Diagnosis not present

## 2015-06-22 DIAGNOSIS — R109 Unspecified abdominal pain: Secondary | ICD-10-CM | POA: Diagnosis not present

## 2015-06-22 DIAGNOSIS — R10814 Left lower quadrant abdominal tenderness: Secondary | ICD-10-CM | POA: Diagnosis not present

## 2015-06-22 LAB — POC URINALSYSI DIPSTICK (AUTOMATED)
Bilirubin, UA: NEGATIVE
Glucose, UA: NEGATIVE
Ketones, UA: NEGATIVE
Leukocytes, UA: NEGATIVE
NITRITE UA: NEGATIVE
PROTEIN UA: NEGATIVE
SPEC GRAV UA: 1.015
UROBILINOGEN UA: 0.2
pH, UA: 6

## 2015-06-22 NOTE — Progress Notes (Signed)
Pre visit review using our clinic review tool, if applicable. No additional management support is needed unless otherwise documented below in the visit note. 

## 2015-06-22 NOTE — Assessment & Plan Note (Signed)
Improved -now off of PPI that caused HA Watching diet

## 2015-06-22 NOTE — Assessment & Plan Note (Signed)
Started tx with flagyl for poss diverticulitis -but stopped it due to side eff/HA  Nl wbc so doubt she has it  ua today

## 2015-06-22 NOTE — Progress Notes (Signed)
Subjective:    Patient ID: Debbie Richardson, female    DOB: 01-30-1961, 55 y.o.   MRN: [id number]  HPI Here with continued abdominal symptoms   Last visit we went back to omeprazole 40 mg  Helped the first several days Then developed a headache -that got worse and worse and then she had to stop it  Also had a back ache     Then tried protonix - gave her the back pain but not the headache   Saw oncology -everything was fine  Put her on antibiotic for poss diverticulitis - was given flagyl -took it for 4 days  Gave her a headache also   Her last colonosc showed diverticulosis sigmoid  She had eaten more nuts over the holidays  CT in the fall was fine   Walking eases the back pain a bit  Urine is dark in am and lighter as the day goes on  No blood in urine or stool   Heartburn is pretty good right now  Still has a bubble feeling over L upper abd (feels bloated)   Back pain - points to R flanks  Tender in LLQ of abd   Lab Results  Component Value Date   WBC 4.9 06/10/2015   HGB 13.6 06/10/2015   HCT 38.1 06/10/2015   MCV 90.7 06/10/2015   PLT 159 06/10/2015     Patient Active Problem List   Diagnosis Date Noted  . Left flank pain 06/22/2015  . Abdominal tenderness of left lower quadrant 06/22/2015  . Abdominal pain 11/26/2014  . Status post laparoscopic hysterectomy 10/29/2014  . Ovarian cancer ([place]) 07/23/2014  . Allergic conjunctivitis and rhinitis 07/04/2012  . GERD (gastroesophageal reflux disease) 05/01/2007  . PSORIASIS NEC 12/07/2006   Past Medical History  Diagnosis Date  . Dizziness     vertigo  . GERD (gastroesophageal reflux disease)   . Anxiety   . Gallstones   . Benign fundic gland polyps of stomach   . Arthritis   . Ovarian cancer [facility])    Past Surgical History  Procedure Laterality Date  . Cholecystectomy    . Cesarean section    . Laparoscopic oopherectomy    . Colonoscopy    . Upper gi endoscopy    . Oophorectomy    .  Laparoscopic hysterectomy N/A 10/29/2014    Procedure: HYSTERECTOMY TOTAL LAPAROSCOPIC, OMENTUMECTOMY;  Surgeon: Gillis Ends, MD;  Location: [facility] ORS;  Service: Gynecology;  Laterality: N/A;   Social History  Substance Use Topics  . Smoking status: Former Research scientist (life sciences)  . Smokeless tobacco: Never Used  . Alcohol Use: 0.0 oz/week    0 Standard drinks or equivalent per week     Comment: 1 drink daily   Family History  Problem Relation Age of Onset  . Hypertension Mother   . Hyperlipidemia Mother   . GER disease Mother   . Heart disease Paternal Grandmother   . Heart disease Father   . Breast cancer Paternal Grandmother   . Kidney disease Neg Hx   . Liver disease Neg Hx   . Colon cancer Neg Hx   . Colon polyps Neg Hx   . Esophageal cancer Neg Hx   . Pancreatic cancer Neg Hx    Allergies  Allergen Reactions  . Ciprofloxacin Anaphylaxis  . Paclitaxel Shortness Of Breath and Other (See Comments)    Other reaction(s): Tight chest (finding) Chest and facial flushing Flushing, chest tightness, SOB w/ Taxol on 05/26/14 at [facility].   Current Outpatient Prescriptions on File Prior to Visit  Medication Sig Dispense Refill  . acetaminophen (TYLENOL) 500 MG tablet Take 2 tablets (1,000 mg total) by mouth every 6 (six) hours. (Patient taking differently: Take 1,000 mg by mouth every 6 (six) hours as needed for mild pain, moderate pain, fever or headache. ) 30 tablet 0  . omeprazole (PRILOSEC) 40 MG capsule Take 1 capsule (40 mg total) by mouth daily before breakfast. 30 capsule 11   Current Facility-Administered Medications on File Prior to Visit  Medication Dose Route Frequency Provider Last Rate Last Dose  . sodium chloride 0.9 % injection 10 mL  10 mL Intracatheter PRN Forest Gleason, MD   10 mL at 08/04/14 1130  . sodium chloride 0.9 % injection 10 mL  10 mL Intravenous PRN Forest Gleason, MD   10 mL at 11/19/14 1628     Review of Systems Review of Systems  Constitutional:  Negative for fever, appetite change, fatigue and unexpected weight change.  Eyes: Negative for pain and visual disturbance.  Respiratory: Negative for cough and shortness of breath.   Cardiovascular: Negative for cp or palpitations    Gastrointestinal: Negative for nausea, diarrhea and constipation. pos for bloating and LLQ tenderness, neg for stool changes/blood in stool Genitourinary: Negative for urgency and frequency. neg for dysuria or visible blood in urine  Skin: Negative for pallor or rash   Neurological: Negative for weakness, light-headedness, numbness and headaches.  Hematological: Negative for adenopathy. Does not bruise/bleed easily.  Psychiatric/Behavioral: Negative for dysphoric mood. The patient is not nervous/anxious.         Objective:   Physical Exam  Constitutional: She appears well-developed and well-nourished. No distress.  Well appearing   HENT:  Head: Normocephalic and atraumatic.  Mouth/Throat: Oropharynx is clear and moist.  Eyes: Conjunctivae and EOM are normal. Pupils are equal, round, and reactive to light. No scleral icterus.  Neck: Normal range of motion. Neck supple.  Cardiovascular: Normal rate, regular rhythm and normal heart sounds.   Pulmonary/Chest: Effort normal and breath sounds normal. No respiratory distress. She has no wheezes. She has no rales.  Abdominal: Soft. Bowel sounds are normal. She exhibits no distension, no abdominal bruit, no pulsatile midline mass and no mass. There is no hepatosplenomegaly or hepatomegaly. There is tenderness in the left lower quadrant. There is CVA tenderness. There is no rebound and no guarding.  L CVA tenderness  Musculoskeletal: She exhibits no edema.  Lymphadenopathy:    She has no cervical adenopathy.  Neurological: She is alert.  Skin: Skin is warm and dry. No erythema. No pallor.  Psychiatric: She has a normal mood and affect.          Assessment & Plan:   Problem List Items Addressed This Visit        Digestive   GERD (gastroesophageal reflux disease) - Primary    Improved -now off of PPI that caused HA Watching diet         Other   Abdominal tenderness of left lower quadrant    Started tx with flagyl for poss diverticulitis -but stopped it due to side eff/HA  Nl wbc so doubt she has it  ua today       Left flank pain    In pt with hx of surgery and chemo for ov cancer  UA pos for blood today  ? If poss renal stone  Sent for cx  Consider CT renal stone protocol w/o contrast Enc  fluids   Adhesions are also in the differential       Relevant Orders   POCT Urinalysis Dipstick (Automated) (Completed)   Urine culture

## 2015-06-22 NOTE — Assessment & Plan Note (Addendum)
In pt with hx of surgery and chemo for ov cancer  UA pos for blood today  ? If poss renal stone  Sent for cx  Consider CT renal stone protocol w/o contrast Enc fluids   Adhesions are also in the differential

## 2015-06-22 NOTE — Patient Instructions (Signed)
Give a urine sample on the way out  If it looks positive for blood or white cells we will consider CT and urine culture  If it is normal -I will refer you to a GI specialist   I do wonder if scar tissue in your abdomen plays a role  See your surgeon in June as planned also

## 2015-06-23 ENCOUNTER — Telehealth: Payer: Self-pay | Admitting: Family Medicine

## 2015-06-23 DIAGNOSIS — R109 Unspecified abdominal pain: Secondary | ICD-10-CM

## 2015-06-23 LAB — URINE CULTURE: Colony Count: 1000

## 2015-06-23 NOTE — Telephone Encounter (Signed)
CT Appt made and patient notifed.

## 2015-06-23 NOTE — Telephone Encounter (Signed)
Ref for CT abd/pelvis -renal stone protocol please

## 2015-06-23 NOTE — Telephone Encounter (Signed)
-----   Message from Tammi Sou, [place] sent at 06/23/2015  9:43 AM EDT ----- Pt did view results on Mychart, pt said she is okay with CT, she usually goes to [facility] (somewhere on [address]?), please put referral in and our pt notified our [facility] will call to schedule appt

## 2015-06-24 ENCOUNTER — Ambulatory Visit
Admission: RE | Admit: 2015-06-24 | Discharge: 2015-06-24 | Disposition: A | Discharge status: Home / Self Care | Payer: BLUE CROSS/BLUE SHIELD | Source: Ambulatory Visit | Attending: Family Medicine | Admitting: Family Medicine

## 2015-06-24 ENCOUNTER — Ambulatory Visit
Admission: RE | Admit: 2015-06-24 | Discharge: 2015-06-24 | Disposition: A | Discharge status: Home / Self Care | Payer: BLUE CROSS/BLUE SHIELD | Source: Ambulatory Visit | Attending: Oncology | Admitting: Oncology

## 2015-06-24 DIAGNOSIS — K5730 Diverticulosis of large intestine without perforation or abscess without bleeding: Secondary | ICD-10-CM | POA: Diagnosis not present

## 2015-06-24 DIAGNOSIS — C569 Malignant neoplasm of unspecified ovary: Secondary | ICD-10-CM | POA: Insufficient documentation

## 2015-06-24 DIAGNOSIS — R109 Unspecified abdominal pain: Secondary | ICD-10-CM

## 2015-06-24 DIAGNOSIS — Z1231 Encounter for screening mammogram for malignant neoplasm of breast: Secondary | ICD-10-CM | POA: Insufficient documentation

## 2015-06-26 ENCOUNTER — Encounter: Payer: Self-pay | Admitting: Family Medicine

## 2015-07-01 ENCOUNTER — Other Ambulatory Visit: Payer: Self-pay | Admitting: *Deleted

## 2015-07-01 ENCOUNTER — Ambulatory Visit: Payer: BLUE CROSS/BLUE SHIELD

## 2015-07-01 DIAGNOSIS — C569 Malignant neoplasm of unspecified ovary: Secondary | ICD-10-CM

## 2015-07-13 ENCOUNTER — Encounter: Payer: Self-pay | Admitting: Internal Medicine

## 2015-07-13 ENCOUNTER — Ambulatory Visit (INDEPENDENT_AMBULATORY_CARE_PROVIDER_SITE_OTHER): Payer: BLUE CROSS/BLUE SHIELD | Admitting: Internal Medicine

## 2015-07-13 VITALS — BP 98/68 | HR 85 | Temp 98.5°F | Wt 126.0 lb

## 2015-07-13 DIAGNOSIS — B9789 Other viral agents as the cause of diseases classified elsewhere: Principal | ICD-10-CM

## 2015-07-13 DIAGNOSIS — J069 Acute upper respiratory infection, unspecified: Secondary | ICD-10-CM | POA: Diagnosis not present

## 2015-07-13 MED ORDER — HYDROCODONE-HOMATROPINE 5-1.5 MG/5ML PO SYRP: 5.0000 mL | ORAL_SOLUTION | Freq: Three times a day (TID) | ORAL | Status: DC | PRN

## 2015-07-13 NOTE — Progress Notes (Signed)
HPI  Pt presents to the clinic today with c/o cough. This started 1 week ago. Initial symptoms included nasal congestion, cough and sore throat. Most of her symptoms have resolved except for productive cough with yellow sputum that is worse in the morning just when she wakes up. She denies SOB. She denies fever, chills or body aches. She denies sick contacts. She does not have a h/o season allergies, but she did try Claritin D this AM.   Review of Systems     Past Medical History  Diagnosis Date  . Dizziness     vertigo  . GERD (gastroesophageal reflux disease)   . Anxiety   . Gallstones   . Benign fundic gland polyps of stomach   . Arthritis   . Ovarian cancer ([place]) 2016    Current Outpatient Prescriptions  Medication Sig Dispense Refill  . acetaminophen (TYLENOL) 500 MG tablet Take 2 tablets (1,000 mg total) by mouth every 6 (six) hours. (Patient taking differently: Take 1,000 mg by mouth every 6 (six) hours as needed for mild pain, moderate pain, fever or headache. ) 30 tablet 0  . omeprazole (PRILOSEC) 40 MG capsule Take 40 mg by mouth daily.    Marland Kitchen HYDROcodone-homatropine (HYCODAN) 5-1.5 MG/5ML syrup Take 5 mLs by mouth every 8 (eight) hours as needed for cough. 120 mL 0   No current facility-administered medications for this visit.   Facility-Administered Medications Ordered in Other Visits  Medication Dose Route Frequency Provider Last Rate Last Dose  . sodium chloride 0.9 % injection 10 mL  10 mL Intracatheter PRN Forest Gleason, MD   10 mL at 08/04/14 1130  . sodium chloride 0.9 % injection 10 mL  10 mL Intravenous PRN Forest Gleason, MD   10 mL at 11/19/14 1628    Allergies  Allergen Reactions  . Ciprofloxacin Anaphylaxis  . Paclitaxel Shortness Of Breath and Other (See Comments)    Other reaction(s): Tight chest (finding) Chest and facial flushing Flushing, chest tightness, SOB w/ Taxol on 05/26/14 at [place].  . Flagyl [Metronidazole] Other (See Comments)     headache  . Omeprazole Other (See Comments)    headache    Family History  Problem Relation Age of Onset  . Hypertension Mother   . Hyperlipidemia Mother   . GER disease Mother   . Heart disease Paternal Grandmother   . Breast cancer Paternal Grandmother 35  . Heart disease Father   . Kidney disease Neg Hx   . Liver disease Neg Hx   . Colon cancer Neg Hx   . Colon polyps Neg Hx   . Esophageal cancer Neg Hx   . Pancreatic cancer Neg Hx     Social History   Social History  . Marital Status: Married    Spouse Name: N/A  . Number of Children: 1  . Years of Education: N/A   Occupational History  . Not on file.   Social History Main Topics  . Smoking status: Former Research scientist (life sciences)  . Smokeless tobacco: Never Used  . Alcohol Use: 0.0 oz/week    0 Standard drinks or equivalent per week     Comment: 1 drink daily  . Drug Use: No  . Sexual Activity: Not on file   Other Topics Concern  . Not on file   Social History Narrative     Constitutional: Denies fever, headache or abrupt weight changes.  HEENT: Denies eye pain, eye redness, ear pain, ringing in the ears, wax buildup, runny nose,  nasal congestion, bloody nose, or sore throat. Respiratory: Positive for cough with some "tightness."   Cardiovascular: Denies chest pain, palpitations or swelling in the hands or feet.  Gastrointestinal: Denies N/V/D/C  No other specific complaints in a complete review of systems (except as listed in HPI above).     Objective:   BP 98/68 mmHg  Pulse 85  Temp(Src) 98.5 F (36.9 C) (Oral)  Wt 126 lb (57.153 kg)  SpO2 97%  LMP 03/30/2013 Wt Readings from Last 3 Encounters:  07/13/15 126 lb (57.153 kg)  06/22/15 127 lb 8 oz (57.834 kg)  06/10/15 124 lb 1.9 oz (56.3 kg)     General: Appears her stated age, well developed, well nourished in NAD. HEENT: Head: normal shape and size; Eyes: sclera white, no icterus, conjunctiva pink; Ears: Tm's gray and intact, normal light reflex; Nose:  mucosa pink and moist, erythematous and inflamed, septum midline; Throat/Mouth: + PND. Teeth present, mucosa erythematous and moist, no exudate noted, no lesions or ulcerations noted.  Neck: No cervical lymphadenopathy.  Cardiovascular: Normal rate and rhythm. S1,S2 noted.  No murmur, rubs or gallops noted.  Pulmonary/Chest: Normal effort and positive vesicular breath sounds. No respiratory distress. No wheezes, rales or ronchi noted.      Assessment & Plan:   Viral Upper Respiratory Infection:  Get some rest and drink plenty of water Continue Claritin D Rx for Hycodan cough syrup  RTC as needed or if symptoms persist.

## 2015-07-13 NOTE — Patient Instructions (Signed)

## 2015-07-13 NOTE — Progress Notes (Signed)
Pre visit review using our clinic review tool, if applicable. No additional management support is needed unless otherwise documented below in the visit note. 

## 2015-07-22 ENCOUNTER — Inpatient Hospital Stay: Payer: BLUE CROSS/BLUE SHIELD | Attending: Oncology

## 2015-07-22 DIAGNOSIS — C569 Malignant neoplasm of unspecified ovary: Secondary | ICD-10-CM | POA: Diagnosis not present

## 2015-07-22 DIAGNOSIS — Z90722 Acquired absence of ovaries, bilateral: Secondary | ICD-10-CM | POA: Diagnosis not present

## 2015-07-22 DIAGNOSIS — Z452 Encounter for adjustment and management of vascular access device: Secondary | ICD-10-CM | POA: Diagnosis not present

## 2015-07-22 DIAGNOSIS — C801 Malignant (primary) neoplasm, unspecified: Secondary | ICD-10-CM

## 2015-07-22 DIAGNOSIS — Z9221 Personal history of antineoplastic chemotherapy: Secondary | ICD-10-CM | POA: Insufficient documentation

## 2015-07-22 MED ORDER — SODIUM CHLORIDE 0.9% FLUSH
10.0000 mL | INTRAVENOUS | Status: DC | PRN
  Administered 2015-07-22: 10 mL via INTRAVENOUS
  Filled 2015-07-22: qty 10

## 2015-07-22 MED ORDER — HEPARIN SOD (PORK) LOCK FLUSH 100 UNIT/ML IV SOLN
INTRAVENOUS | Status: AC
  Filled 2015-07-22: qty 5

## 2015-07-22 MED ORDER — HEPARIN SOD (PORK) LOCK FLUSH 100 UNIT/ML IV SOLN
500.0000 [IU] | Freq: Once | INTRAVENOUS | Status: AC
  Administered 2015-07-22: 500 [IU] via INTRAVENOUS

## 2015-07-24 ENCOUNTER — Ambulatory Visit (INDEPENDENT_AMBULATORY_CARE_PROVIDER_SITE_OTHER): Payer: BLUE CROSS/BLUE SHIELD | Admitting: Primary Care

## 2015-07-24 ENCOUNTER — Encounter: Payer: Self-pay | Admitting: Primary Care

## 2015-07-24 VITALS — BP 116/70 | HR 93 | Temp 98.0°F | Ht 62.0 in | Wt 126.0 lb

## 2015-07-24 DIAGNOSIS — J019 Acute sinusitis, unspecified: Secondary | ICD-10-CM

## 2015-07-24 DIAGNOSIS — B9689 Other specified bacterial agents as the cause of diseases classified elsewhere: Secondary | ICD-10-CM

## 2015-07-24 MED ORDER — AMOXICILLIN-POT CLAVULANATE 875-125 MG PO TABS: 1.0000 | ORAL_TABLET | Freq: Two times a day (BID) | ORAL | Status: DC

## 2015-07-24 NOTE — Progress Notes (Signed)
Subjective:    Patient ID: Debbie Richardson, female    DOB: 09-22-1960, 55 y.o.   MRN: [id number]  HPI  Debbie Richardson is a 55 year old female who presents today with a chief complaint of sinus pressure. She also reports cough, nasal congestion, chest congestion. She was evaluated on 04/10 with same complaints, more so cough and congestion. She was diagnosed with a viral upper respiratory tract infection and was treated with supportive measures including Claritin D and Hycodan cough syrup.   Since her last visit she's noticed increased pressure to maxillary sinuses with pain into her teeth and jaw. She started to feel worse Monday this week with fatigue and increased sinus pressure. Her cough is productive with yellow sputum and she's blowing thick, yellow sputum from her nasal cavity. She's been taking Claritin D, did not ever take the Hycodan syrup.   Review of Systems  Constitutional: Positive for fatigue. Negative for fever.  HENT: Positive for congestion, ear pain and sinus pressure.   Respiratory: Positive for cough and shortness of breath. Negative for wheezing.   Cardiovascular: Negative for chest pain.  Musculoskeletal: Negative for myalgias.       Past Medical History  Diagnosis Date  . Dizziness     vertigo  . GERD (gastroesophageal reflux disease)   . Anxiety   . Gallstones   . Benign fundic gland polyps of stomach   . Arthritis   . Ovarian cancer ([place]) 2016     Social History   Social History  . Marital Status: Married    Spouse Name: N/A  . Number of Children: 1  . Years of Education: N/A   Occupational History  . Not on file.   Social History Main Topics  . Smoking status: Former Research scientist (life sciences)  . Smokeless tobacco: Never Used  . Alcohol Use: 0.0 oz/week    0 Standard drinks or equivalent per week     Comment: 1 drink daily  . Drug Use: No  . Sexual Activity: Not on file   Other Topics Concern  . Not on file   Social History Narrative    Past Surgical  History  Procedure Laterality Date  . Cholecystectomy    . Cesarean section    . Laparoscopic oopherectomy    . Colonoscopy    . Upper gi endoscopy    . Oophorectomy    . Laparoscopic hysterectomy N/A 10/29/2014    Procedure: HYSTERECTOMY TOTAL LAPAROSCOPIC, OMENTUMECTOMY;  Surgeon: Gillis Ends, MD;  Location: [facility] ORS;  Service: Gynecology;  Laterality: N/A;    Family History  Problem Relation Age of Onset  . Hypertension Mother   . Hyperlipidemia Mother   . GER disease Mother   . Heart disease Paternal Grandmother   . Breast cancer Paternal Grandmother 9  . Heart disease Father   . Kidney disease Neg Hx   . Liver disease Neg Hx   . Colon cancer Neg Hx   . Colon polyps Neg Hx   . Esophageal cancer Neg Hx   . Pancreatic cancer Neg Hx     Allergies  Allergen Reactions  . Ciprofloxacin Anaphylaxis  . Paclitaxel Shortness Of Breath and Other (See Comments)    Other reaction(s): Tight chest (finding) Chest and facial flushing Flushing, chest tightness, SOB w/ Taxol on 05/26/14 at [place].  . Flagyl [Metronidazole] Other (See Comments)    headache  . Omeprazole Other (See Comments)    headache    Current Outpatient Prescriptions on  File Prior to Visit  Medication Sig Dispense Refill  . acetaminophen (TYLENOL) 500 MG tablet Take 2 tablets (1,000 mg total) by mouth every 6 (six) hours. (Patient taking differently: Take 1,000 mg by mouth every 6 (six) hours as needed for mild pain, moderate pain, fever or headache. ) 30 tablet 0  . omeprazole (PRILOSEC) 40 MG capsule Take 40 mg by mouth daily.     Current Facility-Administered Medications on File Prior to Visit  Medication Dose Route Frequency Provider Last Rate Last Dose  . sodium chloride 0.9 % injection 10 mL  10 mL Intracatheter PRN Forest Gleason, MD   10 mL at 08/04/14 1130  . sodium chloride 0.9 % injection 10 mL  10 mL Intravenous PRN Forest Gleason, MD   10 mL at 11/19/14 1628    BP 116/70 mmHg   Pulse 93  Temp(Src) 98 F (36.7 C) (Oral)  Ht 5\' 2"  (1.575 m)  Wt 126 lb (57.153 kg)  BMI 23.04 kg/m2  SpO2 98%  LMP 03/30/2013    Objective:   Physical Exam  Constitutional: She appears well-nourished.  HENT:  Right Ear: Tympanic membrane and ear canal normal.  Left Ear: Tympanic membrane and ear canal normal.  Nose: Right sinus exhibits maxillary sinus tenderness and frontal sinus tenderness. Left sinus exhibits maxillary sinus tenderness and frontal sinus tenderness.  Mouth/Throat: Oropharynx is clear and moist.  Eyes: Conjunctivae are normal.  Neck: Neck supple.  Cardiovascular: Normal rate and regular rhythm.   Pulmonary/Chest: Effort normal and breath sounds normal. She has no wheezes. She has no rales.  Lymphadenopathy:    She has no cervical adenopathy.  Skin: Skin is warm and dry.          Assessment & Plan:  Acute Sinusitis:  10+ days with increased sinus pressure, jaw pain, tooth pain. Feeling worse Monday this week. No improvement with Claritin D. Exam today without evidence of dental involvement, moderately tender to maxillary sinuses. Lungs clear. Given duration, presentation, and lack of improvement on OTC's, will treat for likely bacterial involvement. Rx for Augmentin BID 10 day course, Flonase, Delsym OTC. Fluids, rest. Follow up PRN.

## 2015-07-24 NOTE — Patient Instructions (Signed)
Start Augmentin antibiotics. Take 1 tablet by mouth twice daily for 10 days.  Nasal Congestion: Try using Flonase (fluticasone) nasal spray. Instill 2 sprays in each nostril once daily.   Ensure you are staying hydrated with water.  Daytime Cough: Delsym or Robitussin. These may be purchased over the counter.  It was a pleasure meeting you!  Sinusitis, Adult Sinusitis is redness, soreness, and inflammation of the paranasal sinuses. Paranasal sinuses are air pockets within the bones of your face. They are located beneath your eyes, in the middle of your forehead, and above your eyes. In healthy paranasal sinuses, mucus is able to drain out, and air is able to circulate through them by way of your nose. However, when your paranasal sinuses are inflamed, mucus and air can become trapped. This can allow bacteria and other germs to grow and cause infection. Sinusitis can develop quickly and last only a short time (acute) or continue over a long period (chronic). Sinusitis that lasts for more than 12 weeks is considered chronic. CAUSES Causes of sinusitis include:  Allergies.  Structural abnormalities, such as displacement of the cartilage that separates your nostrils (deviated septum), which can decrease the air flow through your nose and sinuses and affect sinus drainage.  Functional abnormalities, such as when the small hairs (cilia) that line your sinuses and help remove mucus do not work properly or are not present. SIGNS AND SYMPTOMS Symptoms of acute and chronic sinusitis are the same. The primary symptoms are pain and pressure around the affected sinuses. Other symptoms include:  Upper toothache.  Earache.  Headache.  Bad breath.  Decreased sense of smell and taste.  A cough, which worsens when you are lying flat.  Fatigue.  Fever.  Thick drainage from your nose, which often is green and may contain pus (purulent).  Swelling and warmth over the affected  sinuses. DIAGNOSIS Your health care provider will perform a physical exam. During your exam, your health care provider may perform any of the following to help determine if you have acute sinusitis or chronic sinusitis:  Look in your nose for signs of abnormal growths in your nostrils (nasal polyps).  Tap over the affected sinus to check for signs of infection.  View the inside of your sinuses using an imaging device that has a light attached (endoscope). If your health care provider suspects that you have chronic sinusitis, one or more of the following tests may be recommended:  Allergy tests.  Nasal culture. A sample of mucus is taken from your nose, sent to a lab, and screened for bacteria.  Nasal cytology. A sample of mucus is taken from your nose and examined by your health care provider to determine if your sinusitis is related to an allergy. TREATMENT Most cases of acute sinusitis are related to a viral infection and will resolve on their own within 10 days. Sometimes, medicines are prescribed to help relieve symptoms of both acute and chronic sinusitis. These may include pain medicines, decongestants, nasal steroid sprays, or saline sprays. However, for sinusitis related to a bacterial infection, your health care provider will prescribe antibiotic medicines. These are medicines that will help kill the bacteria causing the infection. Rarely, sinusitis is caused by a fungal infection. In these cases, your health care provider will prescribe antifungal medicine. For some cases of chronic sinusitis, surgery is needed. Generally, these are cases in which sinusitis recurs more than 3 times per year, despite other treatments. HOME CARE INSTRUCTIONS  Drink plenty of water. Water  helps thin the mucus so your sinuses can drain more easily.  Use a humidifier.  Inhale steam 3-4 times a day (for example, sit in the bathroom with the shower running).  Apply a warm, moist washcloth to your face  3-4 times a day, or as directed by your health care provider.  Use saline nasal sprays to help moisten and clean your sinuses.  Take medicines only as directed by your health care provider.  If you were prescribed either an antibiotic or antifungal medicine, finish it all even if you start to feel better. SEEK IMMEDIATE MEDICAL CARE IF:  You have increasing pain or severe headaches.  You have nausea, vomiting, or drowsiness.  You have swelling around your face.  You have vision problems.  You have a stiff neck.  You have difficulty breathing.   This information is not intended to replace advice given to you by your health care provider. Make sure you discuss any questions you have with your health care provider.   Document Released: 03/21/2005 Document Revised: 04/11/2014 Document Reviewed: 04/05/2011 Elsevier Interactive Patient Education [employer].

## 2015-07-24 NOTE — Progress Notes (Signed)
Pre visit review using our clinic review tool, if applicable. No additional management support is needed unless otherwise documented below in the visit note. 

## 2015-07-28 ENCOUNTER — Inpatient Hospital Stay: Payer: BLUE CROSS/BLUE SHIELD

## 2015-08-03 ENCOUNTER — Inpatient Hospital Stay: Payer: BLUE CROSS/BLUE SHIELD

## 2015-08-11 ENCOUNTER — Ambulatory Visit (INDEPENDENT_AMBULATORY_CARE_PROVIDER_SITE_OTHER): Payer: BLUE CROSS/BLUE SHIELD | Admitting: Family Medicine

## 2015-08-11 ENCOUNTER — Encounter: Payer: Self-pay | Admitting: Family Medicine

## 2015-08-11 VITALS — BP 116/78 | HR 81 | Temp 98.0°F | Ht 62.0 in | Wt 127.2 lb

## 2015-08-11 DIAGNOSIS — J019 Acute sinusitis, unspecified: Secondary | ICD-10-CM

## 2015-08-11 DIAGNOSIS — J0100 Acute maxillary sinusitis, unspecified: Secondary | ICD-10-CM

## 2015-08-11 DIAGNOSIS — B9689 Other specified bacterial agents as the cause of diseases classified elsewhere: Secondary | ICD-10-CM

## 2015-08-11 MED ORDER — PREDNISONE 10 MG PO TABS: ORAL_TABLET | ORAL | Status: DC

## 2015-08-11 MED ORDER — AMOXICILLIN-POT CLAVULANATE 875-125 MG PO TABS: 1.0000 | ORAL_TABLET | Freq: Two times a day (BID) | ORAL | Status: DC

## 2015-08-11 NOTE — Patient Instructions (Signed)
Drink fluids Breathe steam  Use warm compresses on sinuses  Allergy treatment as needed  Take the augmentin and prednisone as directed for sinus infection   Update if not starting to improve in a week to 10 days or if worsening  (we would consider a CT of the sinuses)

## 2015-08-11 NOTE — Progress Notes (Signed)
Subjective:    Patient ID: Debbie Richardson, female    DOB: [birth year], 55 y.o.   MRN: [id number]  HPI Here for uri symptoms   Last seen 3/21 by Allie Bossier - dx with bacterial sinusitis and px 10 d of augmentin (finished 6 d of her and did not finish the course) She felt like she improved but never got back to baseline  Still congested (but less)  No colored DC Sinuses feel inflammed and she can barely smell anything  She did go back to the augmentin and start back on it - thinks it is helping a bit   She also c/o some joint swelling in her hands   No fever   Is taking a decongestant (not pseudoephedrine) She had tried some claritin   Coughs a bit in am  Patient Active Problem List   Diagnosis Date Noted  . Left flank pain 06/22/2015  . Abdominal tenderness of left lower quadrant 06/22/2015  . Abdominal pain 11/26/2014  . Status post laparoscopic hysterectomy 10/29/2014  . Ovarian cancer ([place]) 07/23/2014  . Allergic conjunctivitis and rhinitis 07/04/2012  . GERD (gastroesophageal reflux disease) 05/01/2007  . PSORIASIS NEC 12/07/2006   Past Medical History  Diagnosis Date  . Dizziness     vertigo  . GERD (gastroesophageal reflux disease)   . Anxiety   . Gallstones   . Benign fundic gland polyps of stomach   . Arthritis   . Ovarian cancer ([place]) 2016   Past Surgical History  Procedure Laterality Date  . Cholecystectomy    . Cesarean section    . Laparoscopic oopherectomy    . Colonoscopy    . Upper gi endoscopy    . Oophorectomy    . Laparoscopic hysterectomy N/A 10/29/2014    Procedure: HYSTERECTOMY TOTAL LAPAROSCOPIC, OMENTUMECTOMY;  Surgeon: Gillis Ends, MD;  Location: [facility] ORS;  Service: Gynecology;  Laterality: N/A;   Social History  Substance Use Topics  . Smoking status: Former Research scientist (life sciences)  . Smokeless tobacco: Never Used  . Alcohol Use: 0.0 oz/week    0 Standard drinks or equivalent per week     Comment: 1 drink daily   Family History    Problem Relation Age of Onset  . Hypertension Mother   . Hyperlipidemia Mother   . GER disease Mother   . Heart disease Paternal Grandmother   . Breast cancer Paternal Grandmother 53  . Heart disease Father   . Kidney disease Neg Hx   . Liver disease Neg Hx   . Colon cancer Neg Hx   . Colon polyps Neg Hx   . Esophageal cancer Neg Hx   . Pancreatic cancer Neg Hx    Allergies  Allergen Reactions  . Ciprofloxacin Anaphylaxis  . Paclitaxel Shortness Of Breath and Other (See Comments)    Other reaction(s): Tight chest (finding) Chest and facial flushing Flushing, chest tightness, SOB w/ Taxol on 05/26/14 at [place].  . Flagyl [Metronidazole] Other (See Comments)    headache  . Omeprazole Other (See Comments)    headache   Current Outpatient Prescriptions on File Prior to Visit  Medication Sig Dispense Refill  . acetaminophen (TYLENOL) 500 MG tablet Take 2 tablets (1,000 mg total) by mouth every 6 (six) hours. (Patient taking differently: Take 1,000 mg by mouth every 6 (six) hours as needed for mild pain, moderate pain, fever or headache. ) 30 tablet 0  . amoxicillin-clavulanate (AUGMENTIN) 875-125 MG tablet Take 1 tablet by mouth 2 (two)  times daily. 20 tablet 0  . omeprazole (PRILOSEC) 40 MG capsule Take 40 mg by mouth daily.     Current Facility-Administered Medications on File Prior to Visit  Medication Dose Route Frequency Provider Last Rate Last Dose  . sodium chloride 0.9 % injection 10 mL  10 mL Intracatheter PRN Forest Gleason, MD   10 mL at 08/04/14 1130  . sodium chloride 0.9 % injection 10 mL  10 mL Intravenous PRN Forest Gleason, MD   10 mL at 11/19/14 1628       Review of Systems  Constitutional: Positive for appetite change. Negative for fever and fatigue.  HENT: Positive for congestion, ear pain, postnasal drip, rhinorrhea, sinus pressure and sore throat. Negative for nosebleeds.   Eyes: Negative for pain, redness and itching.  Respiratory: Positive for  cough. Negative for shortness of breath and wheezing.   Cardiovascular: Negative for chest pain.  Gastrointestinal: Negative for nausea, vomiting, abdominal pain and diarrhea.  Endocrine: Negative for polyuria.  Genitourinary: Negative for dysuria, urgency and frequency.  Musculoskeletal: Negative for myalgias and arthralgias.  Allergic/Immunologic: Negative for immunocompromised state.  Neurological: Positive for headaches. Negative for dizziness, tremors, syncope, weakness and numbness.  Hematological: Negative for adenopathy. Does not bruise/bleed easily.  Psychiatric/Behavioral: Negative for dysphoric mood. The patient is not nervous/anxious.           Objective:   Physical Exam  Constitutional: She appears well-developed and well-nourished. No distress.  Well appearing   HENT:  Head: Normocephalic and atraumatic.  Right Ear: External ear normal.  Left Ear: External ear normal.  Mouth/Throat: Oropharynx is clear and moist. No oropharyngeal exudate.  Nares are injected and congested  Bilateral maxillary sinus tenderness  Post nasal drip   Eyes: Conjunctivae and EOM are normal. Pupils are equal, round, and reactive to light. Right eye exhibits no discharge. Left eye exhibits no discharge.  Neck: Normal range of motion. Neck supple.  Cardiovascular: Normal rate and regular rhythm.   Pulmonary/Chest: Effort normal and breath sounds normal. No respiratory distress. She has no wheezes. She has no rales.  Lymphadenopathy:    She has no cervical adenopathy.  Neurological: She is alert. No cranial nerve deficit.  Skin: Skin is warm and dry. No rash noted.  Psychiatric: She has a normal mood and affect.          Assessment & Plan:   Problem List Items Addressed This Visit      Respiratory   Acute sinusitis - Primary    Symptoms did not totally clear up after partial abx course (augmentin)-she went back to it and now starting to improve Will add 7 more days of that Also  prednisone taper 30 mg (side eff disc-she has taken it before) Disc symptomatic care - see instructions on AVS  Update if not starting to improve in a week or if worsening  - or if sense of smell does not return (would consider a Ct of the sinuses)       Relevant Medications   amoxicillin-clavulanate (AUGMENTIN) 875-125 MG tablet   predniSONE (DELTASONE) 10 MG tablet    Other Visit Diagnoses    Acute bacterial sinusitis        Relevant Medications    amoxicillin-clavulanate (AUGMENTIN) 875-125 MG tablet    predniSONE (DELTASONE) 10 MG tablet

## 2015-08-11 NOTE — Progress Notes (Signed)
Pre visit review using our clinic review tool, if applicable. No additional management support is needed unless otherwise documented below in the visit note. 

## 2015-08-11 NOTE — Assessment & Plan Note (Signed)
Symptoms did not totally clear up after partial abx course (augmentin)-she went back to it and now starting to improve Will add 7 more days of that Also prednisone taper 30 mg (side eff disc-she has taken it before) Disc symptomatic care - see instructions on AVS  Update if not starting to improve in a week or if worsening  - or if sense of smell does not return (would consider a Ct of the sinuses)

## 2015-08-17 ENCOUNTER — Telehealth: Payer: Self-pay

## 2015-08-17 ENCOUNTER — Other Ambulatory Visit: Payer: Self-pay

## 2015-08-17 DIAGNOSIS — J3489 Other specified disorders of nose and nasal sinuses: Secondary | ICD-10-CM | POA: Insufficient documentation

## 2015-08-17 DIAGNOSIS — C569 Malignant neoplasm of unspecified ovary: Secondary | ICD-10-CM

## 2015-08-17 NOTE — Telephone Encounter (Signed)
Pt left v/m; pt was seen 08/11/15; pt taking med as instructed and feels slightly better; congestion is slightly better but sense of smell has not come back and pressure in sinus area. Pt request referral to Dr Ayesha Mohair in [place].pt request cb.

## 2015-08-17 NOTE — Telephone Encounter (Signed)
I will do ref and route to [facility]

## 2015-08-17 NOTE — Telephone Encounter (Signed)
5/15-Referral faxed to [facility] for Dr. Mechele Collin for appt to be made -arc

## 2015-09-09 DIAGNOSIS — R0981 Nasal congestion: Secondary | ICD-10-CM | POA: Diagnosis not present

## 2015-09-09 DIAGNOSIS — R430 Anosmia: Secondary | ICD-10-CM | POA: Diagnosis not present

## 2015-09-09 DIAGNOSIS — J301 Allergic rhinitis due to pollen: Secondary | ICD-10-CM | POA: Diagnosis not present

## 2015-09-16 ENCOUNTER — Other Ambulatory Visit: Payer: BLUE CROSS/BLUE SHIELD

## 2015-09-16 ENCOUNTER — Inpatient Hospital Stay: Payer: BLUE CROSS/BLUE SHIELD

## 2015-09-16 ENCOUNTER — Ambulatory Visit: Payer: BLUE CROSS/BLUE SHIELD

## 2015-09-16 ENCOUNTER — Inpatient Hospital Stay: Payer: BLUE CROSS/BLUE SHIELD | Attending: Obstetrics and Gynecology | Admitting: Obstetrics and Gynecology

## 2015-09-16 ENCOUNTER — Encounter: Payer: Self-pay | Admitting: Obstetrics and Gynecology

## 2015-09-16 ENCOUNTER — Encounter: Payer: Self-pay | Admitting: *Deleted

## 2015-09-16 DIAGNOSIS — Z006 Encounter for examination for normal comparison and control in clinical research program: Secondary | ICD-10-CM | POA: Diagnosis not present

## 2015-09-16 DIAGNOSIS — Z79899 Other long term (current) drug therapy: Secondary | ICD-10-CM | POA: Diagnosis not present

## 2015-09-16 DIAGNOSIS — Z803 Family history of malignant neoplasm of breast: Secondary | ICD-10-CM

## 2015-09-16 DIAGNOSIS — Z800 Family history of malignant neoplasm of digestive organs: Secondary | ICD-10-CM | POA: Diagnosis not present

## 2015-09-16 DIAGNOSIS — K219 Gastro-esophageal reflux disease without esophagitis: Secondary | ICD-10-CM | POA: Diagnosis not present

## 2015-09-16 DIAGNOSIS — C569 Malignant neoplasm of unspecified ovary: Secondary | ICD-10-CM

## 2015-09-16 DIAGNOSIS — Z87891 Personal history of nicotine dependence: Secondary | ICD-10-CM | POA: Diagnosis not present

## 2015-09-16 DIAGNOSIS — Z9221 Personal history of antineoplastic chemotherapy: Secondary | ICD-10-CM

## 2015-09-16 DIAGNOSIS — Z8543 Personal history of malignant neoplasm of ovary: Secondary | ICD-10-CM | POA: Diagnosis not present

## 2015-09-16 DIAGNOSIS — F419 Anxiety disorder, unspecified: Secondary | ICD-10-CM

## 2015-09-16 DIAGNOSIS — R102 Pelvic and perineal pain: Secondary | ICD-10-CM

## 2015-09-16 DIAGNOSIS — Z90710 Acquired absence of both cervix and uterus: Secondary | ICD-10-CM

## 2015-09-16 DIAGNOSIS — J329 Chronic sinusitis, unspecified: Secondary | ICD-10-CM

## 2015-09-16 DIAGNOSIS — R05 Cough: Secondary | ICD-10-CM | POA: Diagnosis not present

## 2015-09-16 DIAGNOSIS — Z9049 Acquired absence of other specified parts of digestive tract: Secondary | ICD-10-CM | POA: Insufficient documentation

## 2015-09-16 DIAGNOSIS — Z90722 Acquired absence of ovaries, bilateral: Secondary | ICD-10-CM

## 2015-09-16 DIAGNOSIS — M1990 Unspecified osteoarthritis, unspecified site: Secondary | ICD-10-CM

## 2015-09-16 DIAGNOSIS — L409 Psoriasis, unspecified: Secondary | ICD-10-CM | POA: Diagnosis not present

## 2015-09-16 DIAGNOSIS — K5730 Diverticulosis of large intestine without perforation or abscess without bleeding: Secondary | ICD-10-CM | POA: Diagnosis not present

## 2015-09-16 MED ORDER — SODIUM CHLORIDE 0.9% FLUSH
10.0000 mL | INTRAVENOUS | Status: DC | PRN
  Administered 2015-09-16: 10 mL via INTRAVENOUS
  Filled 2015-09-16: qty 10

## 2015-09-16 MED ORDER — HEPARIN SOD (PORK) LOCK FLUSH 100 UNIT/ML IV SOLN
500.0000 [IU] | Freq: Once | INTRAVENOUS | Status: AC
  Administered 2015-09-16: 500 [IU] via INTRAVENOUS

## 2015-09-16 NOTE — Progress Notes (Signed)
Debbie Richardson returns to clinic as scheduled for her 6 month study visit on the GOG-0225 research study. She reports that she has had a viral infection that started at the end of March and has lingered causing her to develop a sinus infection. States she just completed a round of antibiotics and is still taking a steroid. She has seen her EENT Dr. Kathyrn Sheriff most recently and he told her she has a deviated nasal septum. 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 Kathyrn Sheriff states is most likely related to the viral infection rather than the chemotherapy she received nearly a year ago. [name] also reports she has experienced inflammation in her joints every since this began in March, and she voices concern that there is an auto-immune related process causing this. She reports having experienced a cough and SOB when the viral infection was active. She also states her pelvic pain has not resolved, peripheral neuropathy is unchanged, and constipation is a chronic issue for her. Today's weight is 58.4kg(128 lbs, 12 oz); waist measures 87cm (34.2 inches); Temp-98.4; HR-84 & B/P-117/78. [facility] lab specimens were collected earlier and [name] verified that she has been fasting. While in clinic, patient completed the AFFQ, APAQ, RAND-36 & GSRS-IBS study questionnaires. [name] reports receiving a scheduled call from the [place] nutrition coach every month, and states she had to wear an accelerometer for a couple of days since her last clinic visit. Also reports her exercise has been more sporatic lately due to her joint pain and inflammation. Current AE's with grade and attribution as follows:  09/16/2015  Adverse Event Log  Study/Protocol: GOG 0225 Cycle: Month 6 visit  Event Grade Onset Date Resolved Date Drug Name Attribution Treatment Comments  "Severe" heartburn (dyspepsia) Grade 2 04/29/15 05/25/15 n/a Unrelated Prilosec resumed Prescription for Prilosec ran out  Left pelvic  pain Grade 2 04/26/15  n/a Unlikely none Pt feels due to diverticulitis  Tingling in fingertips Grade 1 unknown  n/a Unrelated none From prior taxane tx  Occasional constipation Grade 1 unknown  n/a Unlikely none chronic  Diverticulosis "GI other" Grade 1 06/24/15  n/a Unrelated none Per CT scan  Cough Grade 1 07/06/15  n/a Unrelated  r/t  URI/viral infection  Shortness of Breath Grade 1 07/06/15  n/a Unrelated  r/t  URI/viral infection  Inflammation of joints Grade 1 07/06/15 approx.  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

## 2015-09-16 NOTE — Progress Notes (Signed)
  Oncology Nurse Navigator Documentation  Navigator Location: [facility] (09/16/15 0900) Navigator Encounter Type: [facility] Follow-up (09/16/15 0900)                                          Time Spent with Patient: 15 (09/16/15 0900)   Chaperoned with pelvic exam

## 2015-09-16 NOTE — Progress Notes (Signed)
Gynecologic Oncology Interval Note  Referring Provider: Dr. Forest Gleason, Dr. 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 She is on GOG225.   She saw Dr. Oliva Bustard on 06/10/2015 and had a negative exam. But she had left lower quadrant pain patient was given Flagyl with presumed diagnosis of diverticulitis. Her pain persisted and she also had hematuria, thus a CT scan of abdomen and pelvis was ordered. Results were positive for diverticular disease; and negative for metastatic disease as noted below.   CT scan 06/24/2015 IMPRESSION: No evidence of urolithiasis, hydronephrosis, or other acute findings. Colonic diverticulosis. No radiographic evidence of diverticulitis.    Lab Results  Component Value Date   CA125 10.0 06/10/2015    She had a mammogram on 06/25/2015 negative ordered by Dr. Oliva Bustard.  She also has multiple other complaints: persistent pelvic discomfort; joint pain; inflammation; sinus infection; loss of sense of smeall; SOR; cough; and bladder function issues.   She had labs drawn today on the GOG225 study.    Oncology Treatment History:  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 See prior notes for complete details. She was taken emergently to the operating room on 04/28/2014 by Dr. Glennon Mac and a laparoscopic procedure was performed.  Bilateral salpingo-oophorectomy with  placement of the cystic lesions in the Endo Catch bag and removal through the LLQ port. Per the operative note the right ovary did rupture and contents of the cysts were spilled into the pelvis.  The final pathology revealed high-grade serous adenocarcinoma involving both ovaries and fallopian tubes. Washings  also revealed clusters of highly atypical glandular cells and surface involvement of the ovary was present. Staging was felt to be stage II at least high grade serous ovarian cancer. Preoperative CA125 was 45.   She received 6 cycles of carboplatin/taxane chemotherapy via an IV port, last treatment June 2016.  She had an allergy to taxol and was switched to taxotere for the last 4 cycles. She underwent interval TLH/omentectomy on 10/29/2014 for ovarian cancer. Her final pathology was negative. She enrolled on GOG225.   CT scan 01/09/2015 negative  CA125 results postchemotherapy  02/11/2015 10.4 03/12/2015 10.7 06/10/2015 10.0     Genetic testing was negative.   Problem List: Patient Active Problem List   Diagnosis Date Noted  . Sinus pressure 08/17/2015  . Acute sinusitis 08/11/2015  . Left flank pain 06/22/2015  . Abdominal tenderness of left lower quadrant 06/22/2015  . Abdominal pain 11/26/2014  . Status post laparoscopic hysterectomy 10/29/2014  . Ovarian cancer ([place]) 07/23/2014  . Allergic conjunctivitis and rhinitis 07/04/2012  . GERD (gastroesophageal reflux disease) 05/01/2007  . PSORIASIS NEC 12/07/2006    Past Medical History: Past Medical History  Diagnosis Date  . Dizziness     vertigo  . GERD (gastroesophageal reflux disease)   . Anxiety   . Gallstones   . Benign fundic gland polyps of stomach   . Arthritis   . Ovarian cancer ([place]) 2016    Past Surgical History: Past Surgical History  Procedure Laterality Date  . Cholecystectomy    . Cesarean section    . Laparoscopic oopherectomy    . Colonoscopy    . Upper gi endoscopy    . Oophorectomy    .  Laparoscopic hysterectomy N/A 10/29/2014    Procedure: HYSTERECTOMY TOTAL LAPAROSCOPIC, OMENTUMECTOMY;  Surgeon: Gillis Ends, MD;  Location: [facility] ORS;  Service: Gynecology;  Laterality: N/A;    Family History: Family History  Problem Relation Age of Onset  . Hypertension Mother   .  Hyperlipidemia Mother   . GER disease Mother   . Heart disease Paternal Grandmother   . Breast cancer Paternal Grandmother 42  . Heart disease Father   . Kidney disease Neg Hx   . Liver disease Neg Hx   . Colon cancer Neg Hx   . Colon polyps Neg Hx   . Esophageal cancer Neg Hx   . Pancreatic cancer Neg Hx     Social History: Social History   Social History  . Marital Status: Married    Spouse Name: N/A  . Number of Children: 1  . Years of Education: N/A   Occupational History  . Not on file.   Social History Main Topics  . Smoking status: Former Research scientist (life sciences)  . Smokeless tobacco: Never Used  . Alcohol Use: 0.0 oz/week    0 Standard drinks or equivalent per week     Comment: 1 drink daily  . Drug Use: No  . Sexual Activity: Not on file   Other Topics Concern  . Not on file   Social History Narrative    Allergies: Allergies  Allergen Reactions  . Ciprofloxacin Anaphylaxis  . Paclitaxel Shortness Of Breath and Other (See Comments)    Other reaction(s): Tight chest (finding) Chest and facial flushing Flushing, chest tightness, SOB w/ Taxol on 05/26/14 at [facility] [place].  . Flagyl [Metronidazole] Other (See Comments)    headache  . Omeprazole Other (See Comments)    headache    Current Medications: Current Outpatient Prescriptions  Medication Sig Dispense Refill  . acetaminophen (TYLENOL) 500 MG tablet Take 2 tablets (1,000 mg total) by mouth every 6 (six) hours. (Patient taking differently: Take 1,000 mg by mouth every 6 (six) hours as needed for mild pain, moderate pain, fever or headache. ) 30 tablet 0  . omeprazole (PRILOSEC) 40 MG capsule Take 40 mg by mouth daily.    . predniSONE (DELTASONE) 10 MG tablet Take 3 pills once daily by mouth for 3 days, then 2 pills once daily for 3 days, then 1 pill once daily for 3 days and then stop 18 tablet 0   No current facility-administered medications for this visit.   Facility-Administered Medications Ordered in Other  Visits  Medication Dose Route Frequency Provider Last Rate Last Dose  . sodium chloride 0.9 % injection 10 mL  10 mL Intracatheter PRN Forest Gleason, MD   10 mL at 08/04/14 1130  . sodium chloride 0.9 % injection 10 mL  10 mL Intravenous PRN Forest Gleason, MD   10 mL at 11/19/14 1628    Genetic Testing: negative   Review of Systems General: negative  HEENT: sinus infection; loss of sense of smell  Lungs: SOB with exertion and cough  Cardiac: no complaints  GI: negative  GU: bladder issues; hematuria; occasional incontinence  Musculoskeletal: back pain; inflammation in joints  Extremities: no complaints  Skin: no complaints  Neuro: peripheral neuropathy  Endocrine: no complaints  Psych: no complaints  Allergy complaints - seasonal       Objective:   There were no vitals filed for this visit.  There is no weight on file to calculate BMI.  ECOG Performance Status: 0 - Asymptomatic  General appearance: alert,  cooperative and appears stated age HEENT:PERRLA, extra ocular movement intact, sclera clear, anicteric CV: RRR Lungs: B CTA Lymph node survey: Negative for axillary, supraclavicular or inguinal adenopathy Abdomen: soft, nontender and nondistended. No hernias, masses, infection or ascites and incisions well healed.  Back: negative for CVAT Extremities: extremities normal, atraumatic, no cyanosis or edema Neurological exam reveals alert, oriented, normal speech, no focal findings or movement disorder noted.  Pelvic: exam chaperoned by nurse;  Vulva: normal appearing vulva with no masses, tenderness or lesions; Vagina: normal vagina and cuff healed; BME: nontender and no masses; UterusCervix: surgically absent. Rectal: negative for masses or nodularity  Lab Results  Component Value Date   CA125 10.0 06/10/2015   CA125 10.7 03/12/2015   CA125 10.4 02/11/2015    Assessment:  CHAVIE MACLEOD is a 55 y.o. female with a history of stage II at least high grade serous  ovarian cancer. Status post LS BSO and 6 cycles of platin/taxane chemotherapy.  No evidence of disease on CT or exam and CA125 normal.  Interval TLH/Omentectomy 7/16 negative for malignancy. Multiple medical complaints and issues.     Plan:   Problem List Items Addressed This Visit      Genitourinary   Ovarian cancer ([place]) - Primary (Chronic)     She is on GOG225 and spoke with Sharyne Richters today. She will see Dr. Rogue Bussing for her next visit in 3 months and return to our clinic in 6 months. We will follow up today's CA125. If she continues to have abdominal/pelvic complaints that are hard to determine if secondary to diverticular disease, adhesive disease vs recurrence. The inflammation concerns me as some ovarian cancers have known paraneoplastic syndromes. If her symptoms persist/worsen without a discernable source we may consider a PET scan. I reviewed this option with the patient and she will contact us if her symptoms worsen.   Gillis Ends, MD  CC:  Dr. Prentice Docker  Dr. Blain Pais

## 2015-09-16 NOTE — Progress Notes (Signed)
Patient reports that she has lost her sense of smell.  Went to ENT office and per Dr. Kennon Portela she is taking a course of prednisone.  Concerned that Chemo may be affecting this.  No complaints of pain, VSS,

## 2015-09-17 LAB — CA 125: CA 125: 9.9 U/mL (ref 0.0–38.1)

## 2015-09-22 DIAGNOSIS — J342 Deviated nasal septum: Secondary | ICD-10-CM | POA: Diagnosis not present

## 2015-09-24 DIAGNOSIS — R430 Anosmia: Secondary | ICD-10-CM | POA: Diagnosis not present

## 2015-09-24 DIAGNOSIS — J342 Deviated nasal septum: Secondary | ICD-10-CM | POA: Diagnosis not present

## 2015-09-24 DIAGNOSIS — J3489 Other specified disorders of nose and nasal sinuses: Secondary | ICD-10-CM | POA: Diagnosis not present

## 2015-10-28 ENCOUNTER — Inpatient Hospital Stay: Payer: BLUE CROSS/BLUE SHIELD | Attending: Internal Medicine

## 2015-10-28 DIAGNOSIS — Z8543 Personal history of malignant neoplasm of ovary: Secondary | ICD-10-CM | POA: Insufficient documentation

## 2015-10-28 DIAGNOSIS — Z9221 Personal history of antineoplastic chemotherapy: Secondary | ICD-10-CM | POA: Diagnosis not present

## 2015-10-28 DIAGNOSIS — Z452 Encounter for adjustment and management of vascular access device: Secondary | ICD-10-CM | POA: Insufficient documentation

## 2015-10-28 DIAGNOSIS — Z90710 Acquired absence of both cervix and uterus: Secondary | ICD-10-CM | POA: Diagnosis not present

## 2015-10-28 DIAGNOSIS — C801 Malignant (primary) neoplasm, unspecified: Secondary | ICD-10-CM

## 2015-10-28 DIAGNOSIS — L400 Psoriasis vulgaris: Secondary | ICD-10-CM | POA: Diagnosis not present

## 2015-10-28 DIAGNOSIS — L821 Other seborrheic keratosis: Secondary | ICD-10-CM | POA: Diagnosis not present

## 2015-10-28 DIAGNOSIS — Z1283 Encounter for screening for malignant neoplasm of skin: Secondary | ICD-10-CM | POA: Diagnosis not present

## 2015-10-28 DIAGNOSIS — Z90722 Acquired absence of ovaries, bilateral: Secondary | ICD-10-CM | POA: Insufficient documentation

## 2015-10-28 MED ORDER — HEPARIN SOD (PORK) LOCK FLUSH 100 UNIT/ML IV SOLN
500.0000 [IU] | Freq: Once | INTRAVENOUS | Status: AC
  Administered 2015-10-28: 500 [IU] via INTRAVENOUS

## 2015-10-28 MED ORDER — SODIUM CHLORIDE 0.9% FLUSH
10.0000 mL | INTRAVENOUS | Status: DC | PRN
  Administered 2015-10-28: 10 mL via INTRAVENOUS
  Filled 2015-10-28: qty 10

## 2015-12-03 ENCOUNTER — Other Ambulatory Visit: Payer: Self-pay | Admitting: Internal Medicine

## 2015-12-03 ENCOUNTER — Telehealth: Payer: Self-pay | Admitting: *Deleted

## 2015-12-03 DIAGNOSIS — R10814 Left lower quadrant abdominal tenderness: Secondary | ICD-10-CM

## 2015-12-03 NOTE — Telephone Encounter (Signed)
Called stating that she fears she is having trouble with cancer again. She is having indigestion, bloating, Left sided abdominal pain, and difficulty eating. She has an appt next Friday with Dr Rogue Bussing, does not see Dr [name] again until December  Please advise

## 2015-12-03 NOTE — Telephone Encounter (Signed)
Per Dr. Janece Canterbury ct scan chest, abd and pelvis within a week.  See md a few days after scan.

## 2015-12-03 NOTE — Telephone Encounter (Signed)
Patient informed that CT has been order gave her date/time and advised she needs to pick up prep and be NPO for 4 hours. She repeated this back to me

## 2015-12-09 ENCOUNTER — Encounter: Payer: Self-pay | Admitting: Radiology

## 2015-12-09 ENCOUNTER — Other Ambulatory Visit: Payer: Self-pay | Admitting: Internal Medicine

## 2015-12-09 ENCOUNTER — Ambulatory Visit
Admission: RE | Admit: 2015-12-09 | Discharge: 2015-12-09 | Disposition: A | Discharge status: Home / Self Care | Payer: BLUE CROSS/BLUE SHIELD | Source: Ambulatory Visit | Attending: Internal Medicine | Admitting: Internal Medicine

## 2015-12-09 DIAGNOSIS — C562 Malignant neoplasm of left ovary: Principal | ICD-10-CM

## 2015-12-09 DIAGNOSIS — Z90710 Acquired absence of both cervix and uterus: Secondary | ICD-10-CM | POA: Insufficient documentation

## 2015-12-09 DIAGNOSIS — Z90722 Acquired absence of ovaries, bilateral: Secondary | ICD-10-CM | POA: Diagnosis not present

## 2015-12-09 DIAGNOSIS — C563 Malignant neoplasm of bilateral ovaries: Secondary | ICD-10-CM

## 2015-12-09 DIAGNOSIS — R10814 Left lower quadrant abdominal tenderness: Secondary | ICD-10-CM | POA: Diagnosis not present

## 2015-12-09 DIAGNOSIS — C569 Malignant neoplasm of unspecified ovary: Secondary | ICD-10-CM | POA: Insufficient documentation

## 2015-12-09 DIAGNOSIS — C561 Malignant neoplasm of right ovary: Secondary | ICD-10-CM

## 2015-12-09 MED ORDER — IOPAMIDOL (ISOVUE-300) INJECTION 61%
100.0000 mL | Freq: Once | INTRAVENOUS | Status: AC | PRN
  Administered 2015-12-09: 100 mL via INTRAVENOUS

## 2015-12-10 ENCOUNTER — Telehealth: Payer: Self-pay | Admitting: *Deleted

## 2015-12-10 ENCOUNTER — Other Ambulatory Visit: Payer: Self-pay

## 2015-12-10 DIAGNOSIS — C569 Malignant neoplasm of unspecified ovary: Secondary | ICD-10-CM

## 2015-12-10 NOTE — Telephone Encounter (Signed)
-----   Message from Cammie Sickle, MD sent at 12/09/2015  6:46 PM EDT ----- Please inform patient that CT is negative for any acute process; however still recommend keeping the appointment as planned.

## 2015-12-10 NOTE — Telephone Encounter (Signed)
Left msg for patient-asking pt to call our office to review scan results. Pt has an apt tomorrow in cancer center.

## 2015-12-10 NOTE — Telephone Encounter (Signed)
Returned phone call-  Results reviewed with patient. She states that she received the results via my chart and has already reviewed them. However, she still feels like she has an intermittent 'air pocket' under her diaphragm that causes indigestion.

## 2015-12-11 ENCOUNTER — Inpatient Hospital Stay: Payer: BLUE CROSS/BLUE SHIELD

## 2015-12-11 ENCOUNTER — Inpatient Hospital Stay: Payer: BLUE CROSS/BLUE SHIELD | Attending: Internal Medicine

## 2015-12-11 ENCOUNTER — Encounter: Payer: Self-pay | Admitting: *Deleted

## 2015-12-11 ENCOUNTER — Encounter: Payer: Self-pay | Admitting: Internal Medicine

## 2015-12-11 ENCOUNTER — Inpatient Hospital Stay (HOSPITAL_BASED_OUTPATIENT_CLINIC_OR_DEPARTMENT_OTHER): Payer: BLUE CROSS/BLUE SHIELD | Admitting: Internal Medicine

## 2015-12-11 DIAGNOSIS — K219 Gastro-esophageal reflux disease without esophagitis: Secondary | ICD-10-CM | POA: Insufficient documentation

## 2015-12-11 DIAGNOSIS — Z8543 Personal history of malignant neoplasm of ovary: Secondary | ICD-10-CM | POA: Insufficient documentation

## 2015-12-11 DIAGNOSIS — C569 Malignant neoplasm of unspecified ovary: Secondary | ICD-10-CM

## 2015-12-11 DIAGNOSIS — Z006 Encounter for examination for normal comparison and control in clinical research program: Secondary | ICD-10-CM

## 2015-12-11 DIAGNOSIS — R1012 Left upper quadrant pain: Secondary | ICD-10-CM

## 2015-12-11 DIAGNOSIS — Z90722 Acquired absence of ovaries, bilateral: Secondary | ICD-10-CM | POA: Diagnosis not present

## 2015-12-11 DIAGNOSIS — Z87891 Personal history of nicotine dependence: Secondary | ICD-10-CM | POA: Insufficient documentation

## 2015-12-11 DIAGNOSIS — C563 Malignant neoplasm of bilateral ovaries: Secondary | ICD-10-CM

## 2015-12-11 DIAGNOSIS — Z9221 Personal history of antineoplastic chemotherapy: Secondary | ICD-10-CM | POA: Diagnosis not present

## 2015-12-11 DIAGNOSIS — Z90710 Acquired absence of both cervix and uterus: Secondary | ICD-10-CM | POA: Diagnosis not present

## 2015-12-11 DIAGNOSIS — Z79899 Other long term (current) drug therapy: Secondary | ICD-10-CM | POA: Insufficient documentation

## 2015-12-11 DIAGNOSIS — M1990 Unspecified osteoarthritis, unspecified site: Secondary | ICD-10-CM | POA: Diagnosis not present

## 2015-12-11 DIAGNOSIS — C562 Malignant neoplasm of left ovary: Principal | ICD-10-CM

## 2015-12-11 DIAGNOSIS — F419 Anxiety disorder, unspecified: Secondary | ICD-10-CM | POA: Insufficient documentation

## 2015-12-11 DIAGNOSIS — R140 Abdominal distension (gaseous): Secondary | ICD-10-CM | POA: Insufficient documentation

## 2015-12-11 DIAGNOSIS — C561 Malignant neoplasm of right ovary: Secondary | ICD-10-CM

## 2015-12-11 LAB — COMPREHENSIVE METABOLIC PANEL
ALK PHOS: 55 U/L (ref 38–126)
ALT: 15 U/L (ref 14–54)
ANION GAP: 8 (ref 5–15)
AST: 18 U/L (ref 15–41)
Albumin: 4.7 g/dL (ref 3.5–5.0)
BUN: 12 mg/dL (ref 6–20)
CALCIUM: 9.4 mg/dL (ref 8.9–10.3)
CO2: 25 mmol/L (ref 22–32)
Chloride: 104 mmol/L (ref 101–111)
Creatinine, Ser: 0.53 mg/dL (ref 0.44–1.00)
GFR calc non Af Amer: >60 mL/min (ref >60)
Glucose, Bld: 88 mg/dL (ref 65–99)
POTASSIUM: 3.8 mmol/L (ref 3.5–5.1)
SODIUM: 137 mmol/L (ref 135–145)
Total Bilirubin: 0.9 mg/dL (ref 0.3–1.2)
Total Protein: 7.2 g/dL (ref 6.5–8.1)

## 2015-12-11 LAB — CBC WITH DIFFERENTIAL/PLATELET
Basophils Absolute: 0.1 10*3/uL (ref 0–0.1)
Basophils Relative: 1 %
EOS ABS: 0.1 10*3/uL (ref 0–0.7)
EOS PCT: 2 %
HCT: 37.5 % (ref 35.0–47.0)
HEMOGLOBIN: 13.3 g/dL (ref 12.0–16.0)
LYMPHS ABS: 1.6 10*3/uL (ref 1.0–3.6)
Lymphocytes Relative: 36 %
MCH: 32.5 pg (ref 26.0–34.0)
MCHC: 35.3 g/dL (ref 32.0–36.0)
MCV: 92 fL (ref 80.0–100.0)
MONO ABS: 0.3 10*3/uL (ref 0.2–0.9)
MONOS PCT: 6 %
Neutro Abs: 2.5 10*3/uL (ref 1.4–6.5)
Neutrophils Relative %: 55 %
PLATELETS: 178 10*3/uL (ref 150–440)
RBC: 4.08 MIL/uL (ref 3.80–5.20)
RDW: 12.7 % (ref 11.5–14.5)
WBC: 4.5 10*3/uL (ref 3.6–11.0)

## 2015-12-11 MED ORDER — HEPARIN SOD (PORK) LOCK FLUSH 100 UNIT/ML IV SOLN
500.0000 [IU] | Freq: Once | INTRAVENOUS | Status: AC
  Administered 2015-12-11: 500 [IU] via INTRAVENOUS

## 2015-12-11 MED ORDER — SODIUM CHLORIDE 0.9% FLUSH
10.0000 mL | INTRAVENOUS | Status: DC | PRN
  Administered 2015-12-11: 10 mL via INTRAVENOUS
  Filled 2015-12-11: qty 10

## 2015-12-11 MED ORDER — HEPARIN SOD (PORK) LOCK FLUSH 100 UNIT/ML IV SOLN
INTRAVENOUS | Status: AC
  Filled 2015-12-11: qty 5

## 2015-12-11 NOTE — Assessment & Plan Note (Addendum)
#   Stage II ovarian ca- status post chemotherapy followed by surgery. Clinically NED C125 pending.  # left upper quad discomfort- unclear etiology. Question scar tissue versus others.  # GOG study; discussed with RN- patient on control arm.  # follow up with Dr.Secord in 3 months; follow up in 6 months/labs. Debbie Richardson- to check if CT done at this time we'll hold good for 12 months CT as per the study guidelines.

## 2015-12-11 NOTE — Progress Notes (Signed)
[facility] OFFICE PROGRESS NOTE  Patient Care Team: [name], MD as PCP - General  Ovarian cancer [facility])   Staging form: Ovary, AJCC 7th Edition   - Clinical stage from 07/23/2014: Stage Unknown (yT2c, NX, M0) - Unsigned   Oncology History    # Jan 2017- IIB high-grade serous ovarian cancer bil ovaries/fallopian tubes s/p carbo-Taxotere [Feb 2017]; 2.  Finished total 6 cycles of chemotherapy with carboplatinum and Taxotere June of 2016  # Patient underwent exploratory laparotomy bilateral oophorectomy and omentectomy in July of 2016 evidence of malignancy was found.  CT SEP 6th 2017- NED  # GOG 0225 [diet/excerise- on control arm]   # Severe reaction to Taxol; also post de-sensitization [hives]     Ovarian cancer ([place])   07/23/2014 Initial Diagnosis    Ovarian cancer       Ovarian cancer, bilateral ([place])   12/09/2015 Initial Diagnosis    Ovarian cancer, bilateral ([place])       This is my first interaction with the patient as patient's primary oncologist has been Dr.Choksi. I reviewed the patient's prior charts/pertinent labs/imaging in detail; findings are summarized above.     INTERVAL HISTORY:  Debbie Richardson 55 y.o.  female pleasant patient above history of Stage II ovarian cancer currently on surveillance is here for follow-up/to review the results of her CAT scan that was done for left upper quadrant pain.  Patient continues to have left upper quadrant discomfort "gas/bloating" which has been going on since the surgery in January 2017. This is intermittent. Not any worse.  Patient voluntarily Cut down on bread; exercising. Not lost any significant weight.  Patient has noted improvement of loose stools/- after cutting down on bread. She wonders-celiac. Recommend evaluation with GI   REVIEW OF SYSTEMS:  A complete 10 point review of system is done which is negative except mentioned above/history of present illness.   PAST MEDICAL HISTORY :   Past Medical History:  Diagnosis Date  . Anxiety   . Arthritis   . Benign fundic gland polyps of stomach   . Dizziness    vertigo  . Gallstones   . GERD (gastroesophageal reflux disease)   . Ovarian cancer ([place]) 2016    PAST SURGICAL HISTORY :   Past Surgical History:  Procedure Laterality Date  . CESAREAN SECTION    . CHOLECYSTECTOMY    . COLONOSCOPY    . LAPAROSCOPIC HYSTERECTOMY N/A 10/29/2014   Procedure: HYSTERECTOMY TOTAL LAPAROSCOPIC, OMENTUMECTOMY;  Surgeon: Gillis Ends, MD;  Location: [facility] ORS;  Service: Gynecology;  Laterality: N/A;  . LAPAROSCOPIC OOPHERECTOMY    . OOPHORECTOMY    . UPPER GI ENDOSCOPY      FAMILY HISTORY :   Family History  Problem Relation Age of Onset  . Hypertension Mother   . Hyperlipidemia Mother   . GER disease Mother   . Heart disease Father   . Heart disease Paternal Grandmother   . Breast cancer Paternal Grandmother 20  . Kidney disease Neg Hx   . Liver disease Neg Hx   . Colon cancer Neg Hx   . Colon polyps Neg Hx   . Esophageal cancer Neg Hx   . Pancreatic cancer Neg Hx     SOCIAL HISTORY:   Social History  Substance Use Topics  . Smoking status: Former Research scientist (life sciences)  . Smokeless tobacco: Never Used  . Alcohol use 0.0 oz/week     Comment: 1 drink daily    ALLERGIES:  is  allergic to ciprofloxacin; paclitaxel; flagyl [metronidazole]; and omeprazole.  MEDICATIONS:  Current Outpatient Prescriptions  Medication Sig Dispense Refill  . acetaminophen (TYLENOL) 500 MG tablet Take 2 tablets (1,000 mg total) by mouth every 6 (six) hours. (Patient taking differently: Take 1,000 mg by mouth every 6 (six) hours as needed for mild pain, moderate pain, fever or headache. ) 30 tablet 0  . omeprazole (PRILOSEC) 40 MG capsule Take 40 mg by mouth daily.     No current facility-administered medications for this visit.    Facility-Administered Medications Ordered in Other Visits  Medication Dose Route Frequency Provider Last Rate  Last Dose  . sodium chloride 0.9 % injection 10 mL  10 mL Intracatheter PRN Forest Gleason, MD   10 mL at 08/04/14 1130  . sodium chloride 0.9 % injection 10 mL  10 mL Intravenous PRN Forest Gleason, MD   10 mL at 11/19/14 1628  . sodium chloride flush (NS) 0.9 % injection 10 mL  10 mL Intravenous PRN Cammie Sickle, MD   10 mL at 10/28/15 1135  . sodium chloride flush (NS) 0.9 % injection 10 mL  10 mL Intravenous PRN Cammie Sickle, MD   10 mL at 12/11/15 1110    PHYSICAL EXAMINATION: ECOG PERFORMANCE STATUS: 0 - Asymptomatic  BP 114/82 (BP Location: Left Arm, Patient Position: Sitting)   Pulse 87   Temp (!) 95.6 F (35.3 C) (Tympanic)   Resp 16   Ht 5\' 2"  (1.575 m)   Wt 128 lb 9.6 oz (58.3 kg)   LMP 03/30/2013   BMI 23.52 kg/m   Filed Weights   12/11/15 1126  Weight: 128 lb 9.6 oz (58.3 kg)    GENERAL: Well-nourished well-developed; Alert, no distress and comfortable.   alone EYES: no pallor or icterus OROPHARYNX: no thrush or ulceration; good dentition  NECK: supple, no masses felt LYMPH:  no palpable lymphadenopathy in the cervical, axillary or inguinal regions LUNGS: clear to auscultation and  No wheeze or crackles HEART/CVS: regular rate & rhythm and no murmurs; No lower extremity edema ABDOMEN:abdomen soft, non-tender and normal bowel sounds Musculoskeletal:no cyanosis of digits and no clubbing  PSYCH: alert & oriented x 3 with fluent speech NEURO: no focal motor/sensory deficits SKIN:  no rashes or significant lesions  LABORATORY DATA:  I have reviewed the data as listed    Component Value Date/Time   NA 137 12/11/2015 1104   NA 135 07/14/2014 0955   K 3.8 12/11/2015 1104   K 3.2 (L) 07/21/2014 1518   CL 104 12/11/2015 1104   CL 106 07/14/2014 0955   CO2 25 12/11/2015 1104   CO2 21 (L) 07/14/2014 0955   GLUCOSE 88 12/11/2015 1104   GLUCOSE 138 (H) 07/14/2014 0955   BUN 12 12/11/2015 1104   BUN 10 07/14/2014 0955   CREATININE 0.53 12/11/2015 1104    CREATININE 0.60 07/14/2014 0955   CALCIUM 9.4 12/11/2015 1104   CALCIUM 9.1 07/14/2014 0955   PROT 7.2 12/11/2015 1104   PROT 7.1 07/14/2014 0955   ALBUMIN 4.7 12/11/2015 1104   ALBUMIN 4.4 07/14/2014 0955   AST 18 12/11/2015 1104   AST 24 07/14/2014 0955   ALT 15 12/11/2015 1104   ALT 20 07/14/2014 0955   ALKPHOS 55 12/11/2015 1104   ALKPHOS 45 07/14/2014 0955   BILITOT 0.9 12/11/2015 1104   BILITOT 0.7 07/14/2014 0955   GFRNONAA >60 12/11/2015 1104   GFRNONAA >60 07/14/2014 0955   GFRAA >60 12/11/2015 1104  GFRAA >60 07/14/2014 0955    No results found for: SPEP, UPEP  Lab Results  Component Value Date   WBC 4.5 12/11/2015   NEUTROABS 2.5 12/11/2015   HGB 13.3 12/11/2015   HCT 37.5 12/11/2015   MCV 92.0 12/11/2015   PLT 178 12/11/2015      Chemistry      Component Value Date/Time   NA 137 12/11/2015 1104   NA 135 07/14/2014 0955   K 3.8 12/11/2015 1104   K 3.2 (L) 07/21/2014 1518   CL 104 12/11/2015 1104   CL 106 07/14/2014 0955   CO2 25 12/11/2015 1104   CO2 21 (L) 07/14/2014 0955   BUN 12 12/11/2015 1104   BUN 10 07/14/2014 0955   CREATININE 0.53 12/11/2015 1104   CREATININE 0.60 07/14/2014 0955      Component Value Date/Time   CALCIUM 9.4 12/11/2015 1104   CALCIUM 9.1 07/14/2014 0955   ALKPHOS 55 12/11/2015 1104   ALKPHOS 45 07/14/2014 0955   AST 18 12/11/2015 1104   AST 24 07/14/2014 0955   ALT 15 12/11/2015 1104   ALT 20 07/14/2014 0955   BILITOT 0.9 12/11/2015 1104   BILITOT 0.7 07/14/2014 0955       RADIOGRAPHIC STUDIES: I have personally reviewed the radiological images as listed and agreed with the findings in the report. No results found.   ASSESSMENT & PLAN:  Ovarian cancer, bilateral ([place]) # Stage II ovarian ca- status post chemotherapy followed by surgery. Clinically NED C125 pending.  # left upper quad discomfort- unclear etiology. Question scar tissue versus others.  # GOG study; discussed with RN- patient on control  arm.  # follow up with Dr.Secord in 3 months; follow up in 6 months/labs. Zigmund Daniel- to check if CT done at this time we'll hold good for 12 months CT as per the study guidelines.   No orders of the defined types were placed in this encounter.  All questions were answered. The patient knows to call the clinic with any problems, questions or concerns.      Cammie Sickle, MD 12/11/2015 1:07 PM

## 2015-12-11 NOTE — Progress Notes (Signed)
Pt reports upper left quadrant pain under left rib with a sensation of pressure.  Pt reports more indgestion and nausea.  Nausea has deceased but has also stopped the medication Nasocort.

## 2015-12-11 NOTE — Progress Notes (Signed)
Debbie Richardson returns to clinic this morning for her 9 month follow up visit on the GOG 0225 research study. States she is still having some intermittant pain in her left side, mostly up under her ribs and rates is a 3/10. Denies taking any pain medication. VS are stable: Temp-95.6; B/P-114/82. Waist measures 80cm today (31.5 inches) and was checked 3 times to verify since Ms. Agresta has not lost any weight. Patient states she has changed her diet and no longer eats bread and has cut out most of her sweets as well. States she is having much less GI problems since she did this about 5 weeks ago. Continues to have problems with sinusitis  but states her EENT says this may be chronic since she has a deviated nasal septum. Reports he prescribed Flonase for her, but she developed nausea and feels the Flonase caused it. She has not regained her sense of smell and denies any improvement, which Dr. Kathyrn Sheriff reports is most likely related to the recent viral infection she experienced.  Ms. Bruzek also reports continued inflammation in her joints since March, and states she is still concerned that there is an auto-immune related process causing this. She reports a lingering occasional cough and SOB as well. She also states her pelvic pain has now localized to the left side and is almost under her ribs and feels like there is "air in there." Peripheral neuropathy is unchanged, and constipation is a chronic issue for her. Dr. Rogue Bussing has discussed wiith Ms. Sivertsen that he may refer her back to her GI doc for an upper endoscopy to further evaluate these problems. She had a CT of her abdomen and pelvis this week, which was negative for any disease recurrence. Current AE's with grade and attribution as follows:  12/11/2015  Adverse Event Log  Study/Protocol: GOG 0225 Cycle: Month 6 visit  Event Grade Onset Date Resolved Date Drug Name Attribution Treatment Comments  Left pelvic pain Grade 2 04/26/15 improved n/a  Unlikely none Pt feels due to diverticulitis  Tingling in fingertips Grade 1 unknown stopped n/a Unrelated none From prior taxane tx  Occasional constipation Grade 1 unknown stopped n/a Unlikely none chronic  Diverticulosis "GI other" Grade 1 06/24/15  n/a Unrelated none Per CT scan  Cough Grade 1 07/06/15  n/a Unrelated  r/t  URI/viral infection  Shortness of Breath Grade 1 07/06/15  n/a Unrelated  r/t  URI/viral infection  Inflammation of joints Grade 1 07/06/15 approx.  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

## 2015-12-12 LAB — CA 125: CA 125: 10.6 U/mL (ref 0.0–38.1)

## 2015-12-14 ENCOUNTER — Telehealth: Payer: Self-pay | Admitting: *Deleted

## 2015-12-14 NOTE — Telephone Encounter (Signed)
T/C made to [facility] for purpose of letting her know what her CA125 level is. Informed that her level from 11/10/15 is 10.6. Patient states "I'm happy with that." She also states she is going to contact her insurance company regarding whether she can see her GI doc and have an upper GI and will call back and have Dr. B refer her. [name], BSN, MHA, OCN 12/14/2015 1:28 PM

## 2016-02-02 ENCOUNTER — Inpatient Hospital Stay: Payer: BLUE CROSS/BLUE SHIELD | Attending: Internal Medicine

## 2016-02-02 DIAGNOSIS — Z8543 Personal history of malignant neoplasm of ovary: Secondary | ICD-10-CM | POA: Diagnosis not present

## 2016-02-02 DIAGNOSIS — Z452 Encounter for adjustment and management of vascular access device: Secondary | ICD-10-CM | POA: Diagnosis not present

## 2016-02-02 DIAGNOSIS — Z9221 Personal history of antineoplastic chemotherapy: Secondary | ICD-10-CM | POA: Diagnosis not present

## 2016-02-02 DIAGNOSIS — Z90710 Acquired absence of both cervix and uterus: Secondary | ICD-10-CM | POA: Diagnosis not present

## 2016-02-02 DIAGNOSIS — Z95828 Presence of other vascular implants and grafts: Secondary | ICD-10-CM

## 2016-02-02 DIAGNOSIS — Z90722 Acquired absence of ovaries, bilateral: Secondary | ICD-10-CM | POA: Insufficient documentation

## 2016-02-02 MED ORDER — HEPARIN SOD (PORK) LOCK FLUSH 100 UNIT/ML IV SOLN
500.0000 [IU] | Freq: Once | INTRAVENOUS | Status: AC
  Administered 2016-02-02: 500 [IU] via INTRAVENOUS

## 2016-02-02 MED ORDER — SODIUM CHLORIDE 0.9% FLUSH
10.0000 mL | INTRAVENOUS | Status: DC | PRN
  Administered 2016-02-02: 10 mL via INTRAVENOUS
  Filled 2016-02-02: qty 10

## 2016-03-14 ENCOUNTER — Telehealth: Payer: Self-pay | Admitting: *Deleted

## 2016-03-14 ENCOUNTER — Other Ambulatory Visit: Payer: Self-pay | Admitting: *Deleted

## 2016-03-14 DIAGNOSIS — C561 Malignant neoplasm of right ovary: Secondary | ICD-10-CM

## 2016-03-14 DIAGNOSIS — C562 Malignant neoplasm of left ovary: Principal | ICD-10-CM

## 2016-03-14 DIAGNOSIS — C563 Malignant neoplasm of bilateral ovaries: Secondary | ICD-10-CM

## 2016-03-14 NOTE — Telephone Encounter (Signed)
T/C made to patient to discus that she needs to be fasting for her labs this week and her appointment is at 2:00pm. Discussed getting appointment changed, but patient states she prefers to come in the morning if possible and have her labs drawn fasting and get her port flushed at the same time. States that way she will have the results of her CA125 when she sees Dr. [name] on Wednesday. Appointment for [facility] labs and port flush changed to tomorrow morning at 8:00am. T/C made back to patient to inform of new appointment time and remind that she needs to be fasting for at least 8 hours prior to having labs collected. Debbie Richardson, BSN, MHA, OCN 03/14/2016 12:50 PM

## 2016-03-15 ENCOUNTER — Inpatient Hospital Stay: Payer: BLUE CROSS/BLUE SHIELD

## 2016-03-15 ENCOUNTER — Inpatient Hospital Stay: Payer: BLUE CROSS/BLUE SHIELD | Attending: Internal Medicine

## 2016-03-15 DIAGNOSIS — Z87891 Personal history of nicotine dependence: Secondary | ICD-10-CM | POA: Diagnosis not present

## 2016-03-15 DIAGNOSIS — C562 Malignant neoplasm of left ovary: Secondary | ICD-10-CM

## 2016-03-15 DIAGNOSIS — L409 Psoriasis, unspecified: Secondary | ICD-10-CM | POA: Diagnosis not present

## 2016-03-15 DIAGNOSIS — Z8543 Personal history of malignant neoplasm of ovary: Secondary | ICD-10-CM | POA: Insufficient documentation

## 2016-03-15 DIAGNOSIS — J310 Chronic rhinitis: Secondary | ICD-10-CM | POA: Diagnosis not present

## 2016-03-15 DIAGNOSIS — Z90722 Acquired absence of ovaries, bilateral: Secondary | ICD-10-CM | POA: Insufficient documentation

## 2016-03-15 DIAGNOSIS — F419 Anxiety disorder, unspecified: Secondary | ICD-10-CM | POA: Diagnosis not present

## 2016-03-15 DIAGNOSIS — Z9221 Personal history of antineoplastic chemotherapy: Secondary | ICD-10-CM | POA: Insufficient documentation

## 2016-03-15 DIAGNOSIS — Z006 Encounter for examination for normal comparison and control in clinical research program: Secondary | ICD-10-CM | POA: Insufficient documentation

## 2016-03-15 DIAGNOSIS — Z881 Allergy status to other antibiotic agents status: Secondary | ICD-10-CM | POA: Diagnosis not present

## 2016-03-15 DIAGNOSIS — C569 Malignant neoplasm of unspecified ovary: Secondary | ICD-10-CM

## 2016-03-15 DIAGNOSIS — C563 Malignant neoplasm of bilateral ovaries: Secondary | ICD-10-CM

## 2016-03-15 DIAGNOSIS — K219 Gastro-esophageal reflux disease without esophagitis: Secondary | ICD-10-CM | POA: Diagnosis not present

## 2016-03-15 DIAGNOSIS — Z90710 Acquired absence of both cervix and uterus: Secondary | ICD-10-CM | POA: Insufficient documentation

## 2016-03-15 DIAGNOSIS — K5730 Diverticulosis of large intestine without perforation or abscess without bleeding: Secondary | ICD-10-CM | POA: Insufficient documentation

## 2016-03-15 DIAGNOSIS — C561 Malignant neoplasm of right ovary: Secondary | ICD-10-CM

## 2016-03-15 DIAGNOSIS — Z95828 Presence of other vascular implants and grafts: Secondary | ICD-10-CM

## 2016-03-15 MED ORDER — HEPARIN SOD (PORK) LOCK FLUSH 100 UNIT/ML IV SOLN
500.0000 [IU] | Freq: Once | INTRAVENOUS | Status: AC
  Administered 2016-03-15: 500 [IU] via INTRAVENOUS

## 2016-03-15 MED ORDER — SODIUM CHLORIDE 0.9% FLUSH
10.0000 mL | INTRAVENOUS | Status: DC | PRN
  Administered 2016-03-15: 10 mL via INTRAVENOUS
  Filled 2016-03-15: qty 10

## 2016-03-16 ENCOUNTER — Inpatient Hospital Stay: Payer: BLUE CROSS/BLUE SHIELD

## 2016-03-16 ENCOUNTER — Encounter: Payer: Self-pay | Admitting: *Deleted

## 2016-03-16 ENCOUNTER — Inpatient Hospital Stay (HOSPITAL_BASED_OUTPATIENT_CLINIC_OR_DEPARTMENT_OTHER): Payer: BLUE CROSS/BLUE SHIELD | Admitting: Obstetrics and Gynecology

## 2016-03-16 VITALS — BP 125/87 | HR 101 | Ht 62.75 in | Wt 128.0 lb

## 2016-03-16 DIAGNOSIS — Z8543 Personal history of malignant neoplasm of ovary: Secondary | ICD-10-CM | POA: Diagnosis not present

## 2016-03-16 DIAGNOSIS — Z87891 Personal history of nicotine dependence: Secondary | ICD-10-CM | POA: Diagnosis not present

## 2016-03-16 DIAGNOSIS — L409 Psoriasis, unspecified: Secondary | ICD-10-CM

## 2016-03-16 DIAGNOSIS — F419 Anxiety disorder, unspecified: Secondary | ICD-10-CM | POA: Diagnosis not present

## 2016-03-16 DIAGNOSIS — K219 Gastro-esophageal reflux disease without esophagitis: Secondary | ICD-10-CM | POA: Diagnosis not present

## 2016-03-16 DIAGNOSIS — Z881 Allergy status to other antibiotic agents status: Secondary | ICD-10-CM

## 2016-03-16 DIAGNOSIS — Z006 Encounter for examination for normal comparison and control in clinical research program: Secondary | ICD-10-CM

## 2016-03-16 DIAGNOSIS — C569 Malignant neoplasm of unspecified ovary: Secondary | ICD-10-CM

## 2016-03-16 DIAGNOSIS — F310 Bipolar disorder, current episode hypomanic: Secondary | ICD-10-CM

## 2016-03-16 DIAGNOSIS — K5730 Diverticulosis of large intestine without perforation or abscess without bleeding: Secondary | ICD-10-CM | POA: Diagnosis not present

## 2016-03-16 DIAGNOSIS — Z9221 Personal history of antineoplastic chemotherapy: Secondary | ICD-10-CM | POA: Diagnosis not present

## 2016-03-16 DIAGNOSIS — R1012 Left upper quadrant pain: Secondary | ICD-10-CM

## 2016-03-16 DIAGNOSIS — Z90722 Acquired absence of ovaries, bilateral: Secondary | ICD-10-CM | POA: Diagnosis not present

## 2016-03-16 DIAGNOSIS — Z90710 Acquired absence of both cervix and uterus: Secondary | ICD-10-CM | POA: Diagnosis not present

## 2016-03-16 DIAGNOSIS — J310 Chronic rhinitis: Secondary | ICD-10-CM | POA: Diagnosis not present

## 2016-03-16 LAB — CA 125: CA 125: 10.1 U/mL (ref 0.0–38.1)

## 2016-03-16 NOTE — Progress Notes (Signed)
  Oncology Nurse Navigator Documentation Chaperoned pelvic exam. Referral to GI placed. Navigator Location: [facility] (03/16/16 1400)   )Navigator Encounter Type: Follow-up Appt;Clinic/[facility]C (03/16/16 1400)                                                    Time Spent with Patient: 15 (03/16/16 1400)

## 2016-03-16 NOTE — Progress Notes (Signed)
Gynecologic Oncology Interval Note  Referring Provider: Dr. Forest Gleason, Dr. 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 She is on GOG225.   She saw Dr. Rogue Bussing on 12/11/2015 and had a negative exam.    CT scan of abdomen and pelvis 12/09/2015 IMPRESSION: Status post hysterectomy and bilateral salpingo-oophorectomy.  No evidence of recurrent or metastatic disease  Lab Results  Component Value Date   CA125 10.1 03/15/2016   She is on the GOG225 study-control arm.   She also has multiple other complaints: persistent abdominal LUQ and midabdominal discomfort; joint pain; SOB,  and back pain.   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 See prior notes for complete details. She was taken emergently to the operating room on 04/28/2014 by Dr. Glennon Mac and a laparoscopic procedure was performed.  Bilateral salpingo-oophorectomy with  placement of the cystic lesions in the Endo Catch bag and removal through the LLQ port. Per the operative note the right ovary did rupture and contents of the cysts were spilled into the pelvis.  The final pathology revealed high-grade serous adenocarcinoma involving both ovaries and fallopian tubes. Washings also revealed clusters of highly atypical glandular cells and surface involvement of the ovary was present. Staging was felt to be stage II at least high grade serous ovarian cancer. Preoperative CA125 was 45.   She received 6 cycles of carboplatin/taxane chemotherapy via an IV port, last treatment June 2016.  She had an allergy to taxol and was switched to taxotere for the last 4 cycles. She underwent interval TLH/omentectomy on  10/29/2014 for ovarian cancer. Her final pathology was negative. She enrolled on GOG225.   CT scan 01/09/2015 negative  CA125 results postchemotherapy  02/11/2015 10.4 03/12/2015 10.7 06/10/2015 10.0 03/15/2016  10.1  CT scan 06/24/2015 IMPRESSION: No evidence of urolithiasis, hydronephrosis, or other acute findings. Colonic diverticulosis. No radiographic evidence of diverticulitis.    Genetic testing was negative.   Screening mammogram on 06/25/2015 negative ordered by Dr. Oliva Bustard.   Problem List: Patient Active Problem List   Diagnosis Date Noted  . Ovarian cancer, bilateral ([place]) 12/09/2015  . Sinus pressure 08/17/2015  . Acute sinusitis 08/11/2015  . Left flank pain 06/22/2015  . Abdominal tenderness of left lower quadrant 06/22/2015  . Abdominal pain 11/26/2014  . Status post laparoscopic hysterectomy 10/29/2014  . Ovarian cancer ([place]) 07/23/2014  . Allergic conjunctivitis and rhinitis 07/04/2012  . GERD (gastroesophageal reflux disease) 05/01/2007  . PSORIASIS NEC 12/07/2006    Past Medical History: Past Medical History:  Diagnosis Date  . Anxiety   . Arthritis   . Benign fundic gland polyps of stomach   . Dizziness    vertigo  . Gallstones   . GERD (gastroesophageal reflux disease)   . Ovarian cancer ([place]) 2016    Past Surgical History: Past Surgical History:  Procedure Laterality Date  . CESAREAN SECTION    . CHOLECYSTECTOMY    . COLONOSCOPY    . LAPAROSCOPIC HYSTERECTOMY N/A 10/29/2014   Procedure: HYSTERECTOMY TOTAL LAPAROSCOPIC, OMENTUMECTOMY;  Surgeon: Gillis Ends, MD;  Location: [facility] ORS;  Service: Gynecology;  Laterality: N/A;  . LAPAROSCOPIC OOPHERECTOMY    . OOPHORECTOMY    . UPPER GI ENDOSCOPY  Family History: Family History  Problem Relation Age of Onset  . Hypertension Mother   . Hyperlipidemia Mother   . GER disease Mother   . Heart disease Father   . Heart disease Paternal Grandmother   . Breast cancer Paternal  Grandmother 32  . Kidney disease Neg Hx   . Liver disease Neg Hx   . Colon cancer Neg Hx   . Colon polyps Neg Hx   . Esophageal cancer Neg Hx   . Pancreatic cancer Neg Hx     Social History: Social History   Social History  . Marital status: Married    Spouse name: N/A  . Number of children: 1  . Years of education: N/A   Occupational History  . Not on file.   Social History Main Topics  . Smoking status: Former Research scientist (life sciences)  . Smokeless tobacco: Never Used  . Alcohol use 0.0 oz/week     Comment: 1 drink daily  . Drug use: No  . Sexual activity: Not on file   Other Topics Concern  . Not on file   Social History Narrative  . No narrative on file    Allergies: Allergies  Allergen Reactions  . Ciprofloxacin Anaphylaxis  . Paclitaxel Shortness Of Breath and Other (See Comments)    Other reaction(s): Tight chest (finding) Chest and facial flushing Flushing, chest tightness, SOB w/ Taxol on 05/26/14 at [place].  . Flagyl [Metronidazole] Other (See Comments)    headache  . Omeprazole Other (See Comments)    headache    Current Medications: Current Outpatient Prescriptions  Medication Sig Dispense Refill  . acetaminophen (TYLENOL) 500 MG tablet Take 2 tablets (1,000 mg total) by mouth every 6 (six) hours. (Patient taking differently: Take 1,000 mg by mouth every 6 (six) hours as needed for mild pain, moderate pain, fever or headache. ) 30 tablet 0  . omeprazole (PRILOSEC) 40 MG capsule Take 40 mg by mouth daily.     No current facility-administered medications for this visit.    Facility-Administered Medications Ordered in Other Visits  Medication Dose Route Frequency Provider Last Rate Last Dose  . sodium chloride 0.9 % injection 10 mL  10 mL Intracatheter PRN Forest Gleason, MD   10 mL at 08/04/14 1130  . sodium chloride 0.9 % injection 10 mL  10 mL Intravenous PRN Forest Gleason, MD   10 mL at 11/19/14 1628  . sodium chloride flush (NS) 0.9 % injection 10 mL  10  mL Intravenous PRN Cammie Sickle, MD   10 mL at 10/28/15 1135    Genetic Testing: negative   Review of Systems General: negative  HEENT: negative  Lungs: SOB   Cardiac: no complaints  GI: as per interval history  GU: h/o bladder issues; negative today  Musculoskeletal: back pain, chronic  Extremities: no complaints  Skin: no complaints  Neuro: peripheral neuropathy  Endocrine: no complaints  Psych: no complaints  Allergy complaints - seasonal       Objective:   There were no vitals filed for this visit.  There is no height or weight on file to calculate BMI.  ECOG Performance Status: 0 - Asymptomatic  General appearance: alert, cooperative and appears stated age HEENT:PERRLA, extra ocular movement intact, sclera clear, anicteric CV: RRR Lungs: B CTA Lymph node survey: Negative for axillary, supraclavicular or inguinal adenopathy Abdomen: soft, nontender and nondistended. No hernias, masses, infection or ascites and incisions well healed.  Back: negative for CVAT Extremities: extremities normal, atraumatic,  no cyanosis or edema Neurological exam reveals alert, oriented, normal speech, no focal findings or movement disorder noted.  Pelvic: exam chaperoned by nurse;  Vulva: normal appearing vulva with no masses, tenderness or lesions; Vagina: normal vagina and cuff healed; BME: nontender and no masses; UterusCervix: surgically absent. Rectal: negative for masses or nodularity  Lab Results  Component Value Date   CA125 10.1 03/15/2016   CA125 10.6 12/11/2015   CA125 9.9 09/16/2015    Assessment:  FATIMA PFEIL is a 55 y.o. female with a history of stage II at least high grade serous ovarian cancer. Status post LS BSO and 6 cycles of platin/taxane chemotherapy.  No evidence of disease on CT or exam and CA125 normal.  Interval TLH/Omentectomy 7/16 negative for malignancy. Persistent and slightly worsening abdominal pain, CT scan negative for metastatic disease,  diverticular disease present.   Multiple medical complaints and issues.     Plan:   Problem List Items Addressed This Visit    None     Continue on GOG225 and spoke with Sharyne Richters today. She will see Dr. Rogue Bussing for her next visit in 3 months and return to our clinic in 6 months.   Given her continued abdominal GI complaints I recommended GI consultation. If negative or pain not improved consider repeat CT, and if that is negative we also discussed PET scan. She would like to proceed with GI consultation. Referral ordered today.   Gillis Ends, MD  CC:  Dr. Prentice Docker  Dr. Blain Pais

## 2016-03-16 NOTE — Progress Notes (Signed)
Patient here for follow up she is still experiencing pain in her abdomen.

## 2016-03-17 NOTE — Progress Notes (Signed)
Debbie Richardson returns to clinic today for her 12 month follow up visit with Dr. [name] on the GOG 0225 research study. Patient continues to complain of some intermittant pain in her left side, mostly up under her left rib and mid-abdominal area. She still rates this a 3/10 on the pain scale. She only takes ES Tylenol as needed for pain. Dr. [name] has discussed wiith Debbie Richardson that she is referring  her to a GI doc for further evaluation of her ongoing pain.   Debbie Richardson continues to report some inflammation in her joints, which she has experienced since March, 2017. She reports a lingering occasional cough, which has improved a lot but not completely resolved, and SOB as well. States her sense of smell has not returned and she was told by her ENT that it is likely permanent, but is the result of a viral infection she had last spring, and is not related to the chemotherapy she received. VS are stable: B/P today is 125/87. Temp was not collected. Waist measures 84cm (33 inches) this afternoon and her weight is 58.1kg (128 lbs). There is no evidence of disease on clinical exam per MD today and patient's CA125 is normal at 10.1. Current AE's with grade and attribution as follows:  03/17/2016  Adverse Event Log  Study/Protocol: GOG 0225 Cycle: Month 6 visit  Event Grade Onset Date Resolved Date Drug Name Attribution Treatment Comments  Left pelvic pain Grade 2 04/26/15 lingering n/a Unlikely none Pt feels due to diverticulitis  Diverticulosis "GI other" Grade 1 06/24/15  n/a Unrelated none Per CT scan  Cough Grade 1 07/06/15  n/a Unrelated  12/13 - reports much improved  Shortness of Breath Grade 1 07/06/15  n/a Unrelated  r/t  URI/viral infection  Inflammation of joints Grade 1 07/06/15 approx.  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

## 2016-03-23 ENCOUNTER — Ambulatory Visit: Payer: BLUE CROSS/BLUE SHIELD

## 2016-04-05 ENCOUNTER — Encounter: Payer: Self-pay | Admitting: Gastroenterology

## 2016-04-05 ENCOUNTER — Ambulatory Visit (INDEPENDENT_AMBULATORY_CARE_PROVIDER_SITE_OTHER): Payer: BLUE CROSS/BLUE SHIELD | Admitting: Gastroenterology

## 2016-04-05 VITALS — BP 124/79 | HR 105 | Temp 98.8°F | Ht 63.0 in | Wt 130.8 lb

## 2016-04-05 DIAGNOSIS — R1012 Left upper quadrant pain: Secondary | ICD-10-CM | POA: Diagnosis not present

## 2016-04-05 DIAGNOSIS — R140 Abdominal distension (gaseous): Secondary | ICD-10-CM | POA: Diagnosis not present

## 2016-04-05 MED ORDER — DOXYCYCLINE HYCLATE 100 MG PO CAPS: 100.0000 mg | ORAL_CAPSULE | Freq: Two times a day (BID) | ORAL | 0 refills | Status: AC

## 2016-04-05 NOTE — Progress Notes (Signed)
Gastroenterology Consultation  Referring Provider:     [name], MD Primary Care Physician:  Loura Pardon, MD Primary Gastroenterologist:  Dr. [name]  Reason for Consultation:     Abdominal pain         HPI:   Debbie Richardson is a 56 y.o. y/o female referred for consultation & management  by Dr. Loura Pardon, MD.  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 Undergone BSO in 2016  . CT abdomen 12/2015- no evidence of metastatic disease.  Labs 12/2015- Hb 13.3,LFT-normal .colonoscopy in 04/2014 showed diverticulosis and internal hemorroids. EGD 04/2014 showed - small gastric polyps(fundic gland polyps) otherwise normal .    Abdominal pain: Onset: Began around 10/2015 - 1 year after her chemotherapy.  Site :LUQ- on and off , occurs for about a week , then none for a while, when she does have it is continuous, does not wake her up at night  Radiation: Localized  Nature of pain: Squuezing-"bloating", last 3-4 weeks more "gassy" Aggravating factors: nothing  Relieving factors :walking helps, passing gas helps  Weight loss: none  NSAID use: none  PPI use :prilosec - not been taking - feels when she takes it makes it worse  Gall bladder surgery: no gall bladder in 1984 Frequency of bowel movements: every day , soft , passes a lot of gas when she has the discomfort, feelds better for a while then the discomfort returns.  Change in bowel movements: no  Relief with bowel movements: yes Gas/Bloating/Abdominal distension: yes does complain of abdominal distension. Dairy makes it worse. Also quit bread. No sodas, no crystallite. Fruit makes it a bit worse.      Past Medical History:  Diagnosis Date  . Anxiety   . Arthritis   . Benign fundic gland polyps of stomach   . Dizziness    vertigo  . Gallstones   . GERD (gastroesophageal reflux disease)   . Ovarian cancer ([place]) 2016    Past  Surgical History:  Procedure Laterality Date  . CESAREAN SECTION    . CHOLECYSTECTOMY    . COLONOSCOPY    . LAPAROSCOPIC HYSTERECTOMY N/A 10/29/2014   Procedure: HYSTERECTOMY TOTAL LAPAROSCOPIC, OMENTUMECTOMY;  Surgeon: Gillis Ends, MD;  Location: [facility] ORS;  Service: Gynecology;  Laterality: N/A;  . LAPAROSCOPIC OOPHERECTOMY    . OOPHORECTOMY    . UPPER GI ENDOSCOPY      Prior to Admission medications   Medication Sig Start Date End Date Taking? Authorizing Provider  acetaminophen (TYLENOL) 500 MG tablet Take 2 tablets (1,000 mg total) by mouth every 6 (six) hours. Patient taking differently: Take 1,000 mg by mouth every 6 (six) hours as needed for mild pain, moderate pain, fever or headache.  10/30/14   Will Bonnet, MD  omeprazole (PRILOSEC) 40 MG capsule Take 40 mg by mouth daily. 05/25/15   Historical Provider, MD    Family History  Problem Relation Age of Onset  . Hypertension Mother   . Hyperlipidemia Mother   . GER disease Mother   . Heart disease Father   . Heart disease Paternal Grandmother   . Breast cancer Paternal Grandmother 77  . Kidney disease Neg Hx   . Liver disease Neg Hx   . Colon cancer Neg Hx   . Colon polyps Neg Hx   . Esophageal cancer Neg Hx   . Pancreatic cancer Neg Hx  Social History  Substance Use Topics  . Smoking status: Former Research scientist (life sciences)  . Smokeless tobacco: Never Used  . Alcohol use 0.0 oz/week     Comment: 1 drink daily    Allergies as of 04/05/2016 - Review Complete 03/16/2016  Allergen Reaction Noted  . Ciprofloxacin Anaphylaxis 08/19/2013  . Paclitaxel Shortness Of Breath and Other (See Comments) 06/27/2014  . Flagyl [metronidazole] Other (See Comments) 06/22/2015  . Omeprazole Other (See Comments) 06/22/2015    Review of Systems:    All systems reviewed and negative except where noted in HPI.   Physical Exam:  LMP 03/30/2013  Patient's last menstrual period was 03/30/2013. Psych:  Alert and cooperative. Normal  mood and affect. General:   Alert,  Well-developed, well-nourished, pleasant and cooperative in NAD Head:  Normocephalic and atraumatic. Eyes:  Sclera clear, no icterus.   Conjunctiva pink. Ears:  Normal auditory acuity. Nose:  No deformity, discharge, or lesions. Mouth:  No deformity or lesions,oropharynx pink & moist. Neck:  Supple; no masses or thyromegaly. Lungs:  Respirations even and unlabored.  Clear throughout to auscultation.   No wheezes, crackles, or rhonchi. No acute distress. Heart:  Regular rate and rhythm; no murmurs, clicks, rubs, or gallops. Abdomen:  Normal bowel sounds.  No bruits.  Soft, non-tender and non-distended without masses, hepatosplenomegaly or hernias noted.  No guarding or rebound tenderness.    Extremities:  No clubbing or edema.  No cyanosis. Neurologic:  Alert and oriented x3;  grossly normal neurologically. Psych:  Alert and cooperative. Normal mood and affect.  Imaging Studies: No results found.  Assessment and Plan:   Debbie Richardson is a 56 y.o. y/o female has been referred for abdominal pain .  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 It is likely adhesions from surgery has contributed to bacterial overgrowth.   Plan: 1. Empirically treat with doxycycline for 2 weeks 2. Low FODMAP diet  3. If no better at next visit will consider imaging of her abdomen .   Follow up in 4 weeks.   Dr [name] MD

## 2016-04-05 NOTE — Patient Instructions (Signed)
Please see the Low [place]odmap Diet paperwork you have been given. Begin this diet and we will have you follow-up in 4 weeks as scheduled below.

## 2016-04-27 ENCOUNTER — Inpatient Hospital Stay: Payer: BLUE CROSS/BLUE SHIELD | Attending: Internal Medicine

## 2016-04-27 DIAGNOSIS — Z8543 Personal history of malignant neoplasm of ovary: Secondary | ICD-10-CM | POA: Insufficient documentation

## 2016-04-27 DIAGNOSIS — Z9221 Personal history of antineoplastic chemotherapy: Secondary | ICD-10-CM | POA: Insufficient documentation

## 2016-04-27 DIAGNOSIS — C569 Malignant neoplasm of unspecified ovary: Secondary | ICD-10-CM

## 2016-04-27 DIAGNOSIS — Z452 Encounter for adjustment and management of vascular access device: Secondary | ICD-10-CM | POA: Diagnosis not present

## 2016-04-27 MED ORDER — HEPARIN SOD (PORK) LOCK FLUSH 100 UNIT/ML IV SOLN
500.0000 [IU] | Freq: Once | INTRAVENOUS | Status: AC
  Administered 2016-04-27: 500 [IU] via INTRAVENOUS
  Filled 2016-04-27: qty 5

## 2016-04-27 MED ORDER — SODIUM CHLORIDE 0.9% FLUSH
10.0000 mL | INTRAVENOUS | Status: DC | PRN
  Administered 2016-04-27: 10 mL via INTRAVENOUS
  Filled 2016-04-27: qty 10

## 2016-05-09 ENCOUNTER — Encounter: Payer: Self-pay | Admitting: Gastroenterology

## 2016-05-09 ENCOUNTER — Ambulatory Visit (INDEPENDENT_AMBULATORY_CARE_PROVIDER_SITE_OTHER): Payer: BLUE CROSS/BLUE SHIELD | Admitting: Gastroenterology

## 2016-05-09 VITALS — BP 120/78 | HR 86 | Ht 63.0 in | Wt 128.0 lb

## 2016-05-09 DIAGNOSIS — K6389 Other specified diseases of intestine: Secondary | ICD-10-CM

## 2016-05-09 NOTE — Progress Notes (Signed)
Primary Care Physician: Loura Pardon, MD  Primary Gastroenterologist:  Dr. [name]   Chief Complaint  Patient presents with  . Follow-up    4 week    HPI: Debbie Richardson is a 56 y.o. female .  She is here for follow up. 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 Undergone BSO in 2016  . CT abdomen 12/2015- no evidence of metastatic disease.  Labs 12/2015- Hb 13.3,LFT-normal .colonoscopy in 04/2014 showed diverticulosis and internal hemorroids. EGD 04/2014 showed - small gastric polyps(fundic gland polyps) otherwise normal.  I treated her empirically with doxycycline for 2 weeks , I felt that the adhesions from surgery were likely contributing to stasis and small bowel bacterial overgrowth.    Interval history 04/2016-05/2016  No bloating after antibiotics course. " no where near what it was" Still has on and off some cramping which is very mild likely from adhesions . She has tried the FODMAP diet which she feels it has helped a lot    Current Outpatient Prescriptions  Medication Sig Dispense Refill  . acetaminophen (TYLENOL) 500 MG tablet Take 2 tablets (1,000 mg total) by mouth every 6 (six) hours. (Patient taking differently: Take 1,000 mg by mouth every 6 (six) hours as needed for mild pain, moderate pain, fever or headache. ) 30 tablet 0  . omeprazole (PRILOSEC) 40 MG capsule Take 40 mg by mouth daily.     No current facility-administered medications for this visit.    Facility-Administered Medications Ordered in Other Visits  Medication Dose Route Frequency Provider Last Rate Last Dose  . sodium chloride 0.9 % injection 10 mL  10 mL Intracatheter PRN Forest Gleason, MD   10 mL at 08/04/14 1130  . sodium chloride 0.9 % injection 10 mL  10 mL Intravenous PRN Forest Gleason, MD   10 mL at 11/19/14 1628  . sodium chloride flush (NS) 0.9 %  injection 10 mL  10 mL Intravenous PRN Cammie Sickle, MD   10 mL at 10/28/15 1135    Allergies as of 05/09/2016 - Review Complete 05/09/2016  Allergen Reaction Noted  . Ciprofloxacin Anaphylaxis 08/19/2013  . Paclitaxel Shortness Of Breath and Other (See Comments) 06/27/2014  . Flagyl [metronidazole] Other (See Comments) 06/22/2015  . Omeprazole Other (See Comments) 06/22/2015    ROS:  General: Negative for anorexia, weight loss, fever, chills, fatigue, weakness. ENT: Negative for hoarseness, difficulty swallowing , nasal congestion. CV: Negative for chest pain, angina, palpitations, dyspnea on exertion, peripheral edema.  Respiratory: Negative for dyspnea at rest, dyspnea on exertion, cough, sputum, wheezing.  GI: See history of present illness. GU:  Negative for dysuria, hematuria, urinary incontinence, urinary frequency, nocturnal urination.  Endo: Negative for unusual weight change.    Physical Examination:   BP 120/78   Pulse 86   Ht 5\' 3"  (1.6 m)   Wt 128 lb (58.1 kg)   LMP 03/30/2013   BMI 22.67 kg/m   General: Well-nourished, well-developed in no acute distress.  Eyes: No icterus. Conjunctivae pink. Mouth: Oropharyngeal mucosa moist and pink , no lesions erythema or exudate. Lungs: Clear to auscultation bilaterally. Non-labored. Heart: Regular rate and rhythm, no murmurs rubs or gallops.  Abdomen: Bowel sounds are normal, nontender, nondistended, no hepatosplenomegaly or masses, no abdominal bruits or hernia , no rebound or guarding.   Extremities: No lower extremity edema.  No clubbing or deformities. Neuro: Alert and oriented x 3.  Grossly intact. Skin: Warm and dry, no jaundice.   Psych: Alert and cooperative, normal mood and affect.  Imaging Studies: No results found.  Assessment and Plan:  Debbie Richardson is a 56 y.o. y/o female here for follow up for abdominal pain likely secondary to bloating from  small bowel bacterial overgrowth syndrome with  secondary lactose intolerance. It is likely adhesions from surgery has contributed to bacterial overgrowth.   Plan: 1.since symptoms have resolved. Suggest trial of lactose containing foods to see if lactose intolerance has resolved, use FODMAP diet as needed .   Dr [name]  MD F/u as needed

## 2016-06-08 ENCOUNTER — Inpatient Hospital Stay: Payer: BLUE CROSS/BLUE SHIELD

## 2016-06-09 ENCOUNTER — Inpatient Hospital Stay (HOSPITAL_BASED_OUTPATIENT_CLINIC_OR_DEPARTMENT_OTHER): Payer: BLUE CROSS/BLUE SHIELD | Admitting: Internal Medicine

## 2016-06-09 ENCOUNTER — Inpatient Hospital Stay: Payer: BLUE CROSS/BLUE SHIELD | Attending: Internal Medicine

## 2016-06-09 ENCOUNTER — Encounter: Payer: Self-pay | Admitting: *Deleted

## 2016-06-09 VITALS — BP 119/85 | HR 86 | Temp 98.6°F | Wt 128.4 lb

## 2016-06-09 DIAGNOSIS — C569 Malignant neoplasm of unspecified ovary: Secondary | ICD-10-CM

## 2016-06-09 DIAGNOSIS — C562 Malignant neoplasm of left ovary: Principal | ICD-10-CM

## 2016-06-09 DIAGNOSIS — Z006 Encounter for examination for normal comparison and control in clinical research program: Secondary | ICD-10-CM | POA: Insufficient documentation

## 2016-06-09 DIAGNOSIS — Z87891 Personal history of nicotine dependence: Secondary | ICD-10-CM

## 2016-06-09 DIAGNOSIS — K219 Gastro-esophageal reflux disease without esophagitis: Secondary | ICD-10-CM | POA: Insufficient documentation

## 2016-06-09 DIAGNOSIS — A499 Bacterial infection, unspecified: Secondary | ICD-10-CM | POA: Insufficient documentation

## 2016-06-09 DIAGNOSIS — F419 Anxiety disorder, unspecified: Secondary | ICD-10-CM | POA: Diagnosis not present

## 2016-06-09 DIAGNOSIS — Z95828 Presence of other vascular implants and grafts: Secondary | ICD-10-CM

## 2016-06-09 DIAGNOSIS — Z8543 Personal history of malignant neoplasm of ovary: Secondary | ICD-10-CM | POA: Diagnosis not present

## 2016-06-09 DIAGNOSIS — Z8371 Family history of colonic polyps: Secondary | ICD-10-CM | POA: Insufficient documentation

## 2016-06-09 DIAGNOSIS — Z9221 Personal history of antineoplastic chemotherapy: Secondary | ICD-10-CM

## 2016-06-09 DIAGNOSIS — C563 Malignant neoplasm of bilateral ovaries: Secondary | ICD-10-CM

## 2016-06-09 DIAGNOSIS — C561 Malignant neoplasm of right ovary: Secondary | ICD-10-CM

## 2016-06-09 LAB — CBC WITH DIFFERENTIAL/PLATELET
Basophils Absolute: 0 10*3/uL (ref 0–0.1)
Basophils Relative: 1 %
Eosinophils Absolute: 0 10*3/uL (ref 0–0.7)
Eosinophils Relative: 1 %
HCT: 37.4 % (ref 35.0–47.0)
HEMOGLOBIN: 13.4 g/dL (ref 12.0–16.0)
LYMPHS ABS: 1.7 10*3/uL (ref 1.0–3.6)
LYMPHS PCT: 37 %
MCH: 32.5 pg (ref 26.0–34.0)
MCHC: 35.8 g/dL (ref 32.0–36.0)
MCV: 90.7 fL (ref 80.0–100.0)
MONOS PCT: 7 %
Monocytes Absolute: 0.3 10*3/uL (ref 0.2–0.9)
NEUTROS PCT: 54 %
Neutro Abs: 2.5 10*3/uL (ref 1.4–6.5)
Platelets: 209 10*3/uL (ref 150–440)
RBC: 4.12 MIL/uL (ref 3.80–5.20)
RDW: 13.1 % (ref 11.5–14.5)
WBC: 4.6 10*3/uL (ref 3.6–11.0)

## 2016-06-09 LAB — COMPREHENSIVE METABOLIC PANEL
ALK PHOS: 60 U/L (ref 38–126)
ALT: 17 U/L (ref 14–54)
AST: 21 U/L (ref 15–41)
Albumin: 4.9 g/dL (ref 3.5–5.0)
Anion gap: 9 (ref 5–15)
BUN: 16 mg/dL (ref 6–20)
CALCIUM: 9.3 mg/dL (ref 8.9–10.3)
CO2: 25 mmol/L (ref 22–32)
CREATININE: 0.54 mg/dL (ref 0.44–1.00)
Chloride: 101 mmol/L (ref 101–111)
Glucose, Bld: 91 mg/dL (ref 65–99)
Potassium: 3.8 mmol/L (ref 3.5–5.1)
Sodium: 135 mmol/L (ref 135–145)
TOTAL PROTEIN: 7.4 g/dL (ref 6.5–8.1)
Total Bilirubin: 0.9 mg/dL (ref 0.3–1.2)

## 2016-06-09 NOTE — Progress Notes (Signed)
[facility] OFFICE PROGRESS NOTE  Patient Care Team: [name], MD as PCP - General  Cancer Staging No matching staging information was found for the patient.    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  Finished total 6 cycles of chemotherapy with carboplatinum and Taxotere June of 2016  # Patient underwent exploratory laparotomy bilateral oophorectomy and omentectomy in July of 2016 evidence of malignancy was found.  CT SEP 6th 2017- NED  # GOG 0225 [diet/excerise- on control arm]   # Severe reaction to Taxol; also post de-sensitization [hives]     Ovarian cancer, bilateral ([place])   12/09/2015 Initial Diagnosis    Ovarian cancer, bilateral ([place])        INTERVAL HISTORY:  Debbie Richardson 56 y.o.  female pleasant patient above history of Stage II ovarian cancer currently on surveillance is here for follow-up.  In the interim patient was evaluated by GI treated with doxycycline for possible bacterial overgrowth syndrome. Symptoms have improved. However continues to have low intensity/ intermittent abdominal discomfort. Otherwise no nausea no vomiting. No abdominal distention.  REVIEW OF SYSTEMS:  A complete 10 point review of system is done which is negative except mentioned above/history of present illness.   PAST MEDICAL HISTORY :  Past Medical History:  Diagnosis Date  . Anxiety   . Arthritis   . Benign fundic gland polyps of stomach   . Dizziness    vertigo  . Gallstones   . GERD (gastroesophageal reflux disease)   . Ovarian cancer ([place]) 2016    PAST SURGICAL HISTORY :   Past Surgical History:  Procedure Laterality Date  . CESAREAN SECTION    . CHOLECYSTECTOMY    . COLONOSCOPY    . LAPAROSCOPIC HYSTERECTOMY N/A 10/29/2014   Procedure: HYSTERECTOMY TOTAL LAPAROSCOPIC, OMENTUMECTOMY;  Surgeon: Gillis Ends, MD;  Location: [facility] ORS;  Service:  Gynecology;  Laterality: N/A;  . LAPAROSCOPIC OOPHERECTOMY    . OOPHORECTOMY    . UPPER GI ENDOSCOPY      FAMILY HISTORY :   Family History  Problem Relation Age of Onset  . Hypertension Mother   . Hyperlipidemia Mother   . GER disease Mother   . Heart disease Father   . Heart disease Paternal Grandmother   . Breast cancer Paternal Grandmother 47  . Kidney disease Neg Hx   . Liver disease Neg Hx   . Colon cancer Neg Hx   . Colon polyps Neg Hx   . Esophageal cancer Neg Hx   . Pancreatic cancer Neg Hx     SOCIAL HISTORY:   Social History  Substance Use Topics  . Smoking status: Former Smoker    Quit date: 04/05/1991  . Smokeless tobacco: Never Used  . Alcohol use 0.0 oz/week     Comment: 1 drink of wine daily    ALLERGIES:  is allergic to ciprofloxacin; paclitaxel; flagyl [metronidazole]; and omeprazole.  MEDICATIONS:  Current Outpatient Prescriptions  Medication Sig Dispense Refill  . acetaminophen (TYLENOL) 500 MG tablet Take 2 tablets (1,000 mg total) by mouth every 6 (six) hours. (Patient taking differently: Take 1,000 mg by mouth every 6 (six) hours as needed for mild pain, moderate pain, fever or headache. ) 30 tablet 0  . omeprazole (PRILOSEC) 40 MG capsule Take 40 mg by mouth daily.     No current facility-administered medications for this visit.    Facility-Administered Medications  Ordered in Other Visits  Medication Dose Route Frequency Provider Last Rate Last Dose  . sodium chloride 0.9 % injection 10 mL  10 mL Intravenous PRN Forest Gleason, MD   10 mL at 11/19/14 1628  . sodium chloride flush (NS) 0.9 % injection 10 mL  10 mL Intravenous PRN Cammie Sickle, MD   10 mL at 10/28/15 1135  . sodium chloride flush (NS) 0.9 % injection 10 mL  10 mL Intravenous PRN Cammie Sickle, MD   10 mL at 06/09/16 1045    PHYSICAL EXAMINATION: ECOG PERFORMANCE STATUS: 0 - Asymptomatic  BP 119/85 (BP Location: Left Arm, Patient Position: Sitting)   Pulse 86    Temp 98.6 F (37 C) (Tympanic)   Wt 128 lb 6 oz (58.2 kg)   LMP 03/30/2013   BMI 22.74 kg/m   Filed Weights   06/09/16 1108  Weight: 128 lb 6 oz (58.2 kg)    GENERAL: Well-nourished well-developed; Alert, no distress and comfortable.   alone EYES: no pallor or icterus OROPHARYNX: no thrush or ulceration; good dentition  NECK: supple, no masses felt LYMPH:  no palpable lymphadenopathy in the cervical, axillary or inguinal regions LUNGS: clear to auscultation and  No wheeze or crackles HEART/CVS: regular rate & rhythm and no murmurs; No lower extremity edema ABDOMEN:abdomen soft, non-tender and normal bowel sounds Musculoskeletal:no cyanosis of digits and no clubbing  PSYCH: alert & oriented x 3 with fluent speech NEURO: no focal motor/sensory deficits SKIN:  no rashes or significant lesions  LABORATORY DATA:  I have reviewed the data as listed    Component Value Date/Time   NA 135 06/09/2016 1045   NA 135 07/14/2014 0955   K 3.8 06/09/2016 1045   K 3.2 (L) 07/21/2014 1518   CL 101 06/09/2016 1045   CL 106 07/14/2014 0955   CO2 25 06/09/2016 1045   CO2 21 (L) 07/14/2014 0955   GLUCOSE 91 06/09/2016 1045   GLUCOSE 138 (H) 07/14/2014 0955   BUN 16 06/09/2016 1045   BUN 10 07/14/2014 0955   CREATININE 0.54 06/09/2016 1045   CREATININE 0.60 07/14/2014 0955   CALCIUM 9.3 06/09/2016 1045   CALCIUM 9.1 07/14/2014 0955   PROT 7.4 06/09/2016 1045   PROT 7.1 07/14/2014 0955   ALBUMIN 4.9 06/09/2016 1045   ALBUMIN 4.4 07/14/2014 0955   AST 21 06/09/2016 1045   AST 24 07/14/2014 0955   ALT 17 06/09/2016 1045   ALT 20 07/14/2014 0955   ALKPHOS 60 06/09/2016 1045   ALKPHOS 45 07/14/2014 0955   BILITOT 0.9 06/09/2016 1045   BILITOT 0.7 07/14/2014 0955   GFRNONAA >60 06/09/2016 1045   GFRNONAA >60 07/14/2014 0955   GFRAA >60 06/09/2016 1045   GFRAA >60 07/14/2014 0955    No results found for: SPEP, UPEP  Lab Results  Component Value Date   WBC 4.6 06/09/2016    NEUTROABS 2.5 06/09/2016   HGB 13.4 06/09/2016   HCT 37.4 06/09/2016   MCV 90.7 06/09/2016   PLT 209 06/09/2016      Chemistry      Component Value Date/Time   NA 135 06/09/2016 1045   NA 135 07/14/2014 0955   K 3.8 06/09/2016 1045   K 3.2 (L) 07/21/2014 1518   CL 101 06/09/2016 1045   CL 106 07/14/2014 0955   CO2 25 06/09/2016 1045   CO2 21 (L) 07/14/2014 0955   BUN 16 06/09/2016 1045   BUN 10 07/14/2014 0955   CREATININE  0.54 06/09/2016 1045   CREATININE 0.60 07/14/2014 0955      Component Value Date/Time   CALCIUM 9.3 06/09/2016 1045   CALCIUM 9.1 07/14/2014 0955   ALKPHOS 60 06/09/2016 1045   ALKPHOS 45 07/14/2014 0955   AST 21 06/09/2016 1045   AST 24 07/14/2014 0955   ALT 17 06/09/2016 1045   ALT 20 07/14/2014 0955   BILITOT 0.9 06/09/2016 1045   BILITOT 0.7 07/14/2014 0955       RADIOGRAPHIC STUDIES: I have personally reviewed the radiological images as listed and agreed with the findings in the report. No results found.   ASSESSMENT & PLAN:  Ovarian cancer, bilateral ([place]) # Stage II ovarian ca- status post chemotherapy followed by surgery. Clinically NED C125-negative. Recommend CT scan in 6 months. Patient's appointment with GYN oncology in 3 months  # left upper quad discomfort- unclear etiology ? Bacterial overgrowth/ status post doxycycline improved. Continue follow-up with GI.  # GOG study; discussed with RN- patient on control arm.  # port fush- every 2 months.  # Follow-up with me in 6 months/lab CT scan prior   No orders of the defined types were placed in this encounter.  All questions were answered. The patient knows to call the clinic with any problems, questions or concerns.      Cammie Sickle, MD 06/10/2016 5:10 PM

## 2016-06-09 NOTE — Assessment & Plan Note (Addendum)
#   Stage II ovarian ca- status post chemotherapy followed by surgery. Clinically NED C125-negative. Recommend CT scan in 6 months. Patient's appointment with GYN oncology in 3 months  # left upper quad discomfort- unclear etiology ? Bacterial overgrowth/ status post doxycycline improved. Continue follow-up with GI.  # GOG study; discussed with RN- patient on control arm.  # port fush- every 2 months.  # Follow-up with me in 6 months/lab CT scan prior

## 2016-06-09 NOTE — Progress Notes (Signed)
Debbie Richardson returns to clinic today for her 15 month follow up visit with Dr. Rogue Bussing on the GOG 0225 research study. She has a friend accompanying. Patient continues to complain of some intermittant "stabbing" pain in her left side, mostly left lower abdomen. She only takes ES Tylenol as needed for pain, and reports this helps, but the pain eventually resolves as "things move through and clear out." Patient consulted with a GI physician, Dr. Bailey Mech for further evaluation of her ongoing pain, and reports they put her on a FODMAP diet, which helped a lot but was difficult to maintain. Reports she has added some foods, particularly bread back to her diet now and states she did take a round of Doxycycline, which reportedly made a big difference as well. States Dr. Bailey Mech told her there could be some infection that sets up in her stomach/bowel as a result of the residual scar tissue, and she may need to take low dose Doxycycline for this at times. [name] states she feels that the scar tissue could be the cause of some of her pain and Dr. Rogue Bussing concurs with this. She currently denies any other problems, but reports continued loss of smell, and states she does not expect that will ever return. VS are stable: B/P today is 119/85. Temp was 98.6 F. Waist measures 77cm (30.25 inches) this afternoon and her weight is 58.2kg (128 lbs - 6 oz). There is no evidence of recurrent disease on clinical exam per MD today and patient's CA125 is normal at 9.9. Dr. Rogue Bussing plans to repeat a CT of abdomen and pelvis in 6 months unless patient becomes symptomatic. Patient has appt to see Dr. [name] in 3 months. Current AE's with grade and attribution as follows:  06/09/2016  Adverse Event Log  Study/Protocol: GOG 0225 Cycle: Month 12 visit  Event Grade Onset Date Resolved Date Drug Name Attribution Treatment Comments  Left pelvic pain Grade 2 04/26/15 ongoing n/a Unlikely none Pt feels due to diverticulitis   Diverticulosis "GI other" Grade 1 06/24/15 Ongoing; improved n/a Unrelated none Per CT scan  Cough Grade 1 07/06/15 resolved n/a Unrelated    Shortness of Breath Grade 1 07/06/15 resolved n/a Unrelated    Inflammation of joints Grade 1 07/06/15 approx.  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

## 2016-06-09 NOTE — Progress Notes (Signed)
Patient here today for follow up.  Patient states no new concerns today  

## 2016-06-10 LAB — CA 125: CA 125: 9.9 U/mL (ref 0.0–38.1)

## 2016-06-10 MED ORDER — HEPARIN SOD (PORK) LOCK FLUSH 100 UNIT/ML IV SOLN
500.0000 [IU] | Freq: Once | INTRAVENOUS | Status: AC
  Administered 2016-06-09: 500 [IU] via INTRAVENOUS

## 2016-06-10 MED ORDER — SODIUM CHLORIDE 0.9% FLUSH
10.0000 mL | INTRAVENOUS | Status: DC | PRN
  Administered 2016-06-09: 10 mL via INTRAVENOUS
  Filled 2016-06-10: qty 10

## 2016-07-25 ENCOUNTER — Telehealth: Payer: Self-pay

## 2016-07-25 NOTE — Telephone Encounter (Signed)
  Oncology Nurse Navigator Documentation Appointments for Dr. [name] have been rescheduled for 6/13, with labs at [facility] and see Dr. [name] at 0900. Ms. Winkel notified regarding appointments Navigator Location: [facility] (07/25/16 1400)   )Navigator Encounter Type: Telephone;Follow-up Appt (07/25/16 1400)                                                    Time Spent with Patient: 15 (07/25/16 1400)

## 2016-07-29 ENCOUNTER — Inpatient Hospital Stay: Payer: BLUE CROSS/BLUE SHIELD | Attending: Internal Medicine

## 2016-07-29 DIAGNOSIS — Z8543 Personal history of malignant neoplasm of ovary: Secondary | ICD-10-CM | POA: Diagnosis not present

## 2016-07-29 DIAGNOSIS — Z452 Encounter for adjustment and management of vascular access device: Secondary | ICD-10-CM | POA: Insufficient documentation

## 2016-07-29 DIAGNOSIS — Z90710 Acquired absence of both cervix and uterus: Secondary | ICD-10-CM | POA: Insufficient documentation

## 2016-07-29 DIAGNOSIS — Z90722 Acquired absence of ovaries, bilateral: Secondary | ICD-10-CM | POA: Insufficient documentation

## 2016-07-29 DIAGNOSIS — Z9221 Personal history of antineoplastic chemotherapy: Secondary | ICD-10-CM | POA: Insufficient documentation

## 2016-07-29 DIAGNOSIS — Z006 Encounter for examination for normal comparison and control in clinical research program: Secondary | ICD-10-CM | POA: Insufficient documentation

## 2016-07-29 DIAGNOSIS — Z95828 Presence of other vascular implants and grafts: Secondary | ICD-10-CM

## 2016-07-29 MED ORDER — SODIUM CHLORIDE 0.9% FLUSH
10.0000 mL | INTRAVENOUS | Status: DC | PRN
  Administered 2016-07-29: 10 mL via INTRAVENOUS
  Filled 2016-07-29: qty 10

## 2016-07-29 MED ORDER — HEPARIN SOD (PORK) LOCK FLUSH 100 UNIT/ML IV SOLN
500.0000 [IU] | Freq: Once | INTRAVENOUS | Status: AC
  Administered 2016-07-29: 500 [IU] via INTRAVENOUS

## 2016-08-02 ENCOUNTER — Other Ambulatory Visit: Payer: Self-pay | Admitting: Internal Medicine

## 2016-08-02 DIAGNOSIS — Z1231 Encounter for screening mammogram for malignant neoplasm of breast: Secondary | ICD-10-CM

## 2016-08-05 ENCOUNTER — Ambulatory Visit
Admission: RE | Admit: 2016-08-05 | Discharge: 2016-08-05 | Disposition: A | Discharge status: Home / Self Care | Payer: BLUE CROSS/BLUE SHIELD | Source: Ambulatory Visit | Attending: Internal Medicine | Admitting: Internal Medicine

## 2016-08-05 ENCOUNTER — Other Ambulatory Visit: Payer: Self-pay

## 2016-08-05 ENCOUNTER — Telehealth: Payer: Self-pay | Admitting: Gastroenterology

## 2016-08-05 DIAGNOSIS — K219 Gastro-esophageal reflux disease without esophagitis: Secondary | ICD-10-CM

## 2016-08-05 DIAGNOSIS — Z1231 Encounter for screening mammogram for malignant neoplasm of breast: Secondary | ICD-10-CM | POA: Diagnosis not present

## 2016-08-05 MED ORDER — OMEPRAZOLE 40 MG PO CPDR: 40.0000 mg | DELAYED_RELEASE_CAPSULE | Freq: Every day | ORAL | 2 refills | Status: DC

## 2016-08-05 NOTE — Telephone Encounter (Signed)
*  STAT* If patient is at the pharmacy, call can be transferred to refill team.   1. Which medications need to be refilled? (please list name of each medication and dose if known) omeprazole (PRILOSEC) 40 MG capsule  2. Which pharmacy/location (including street and city if local pharmacy) is medication to be sent to? [facility] in [address]  3. Do they need a 30 day or 90 day supply? 30 day

## 2016-09-06 ENCOUNTER — Telehealth: Payer: Self-pay | Admitting: Gastroenterology

## 2016-09-06 ENCOUNTER — Other Ambulatory Visit: Payer: Self-pay

## 2016-09-06 NOTE — Telephone Encounter (Signed)
Patient needs a refill on Doxycycline 100mg  called into [facility] in [place]

## 2016-09-06 NOTE — Telephone Encounter (Signed)
Advised patient we would be unable to refill Rx for doxycycline without orders from Dr. Vicente Males.   Pt states symptoms have restarted but she doesn't have a problem setting an appointment if necessary.   Advised pt would send Dr. Vicente Males a msg with information and callback on Thursday with his response.

## 2016-09-07 ENCOUNTER — Other Ambulatory Visit: Payer: Self-pay

## 2016-09-07 DIAGNOSIS — R1084 Generalized abdominal pain: Secondary | ICD-10-CM

## 2016-09-07 MED ORDER — DOXYCYCLINE HYCLATE 100 MG PO CAPS: 100.0000 mg | ORAL_CAPSULE | Freq: Two times a day (BID) | ORAL | 0 refills | Status: AC

## 2016-09-07 NOTE — Telephone Encounter (Signed)
-----   Message from [name], MD sent at 09/07/2016  1:23 PM EDT ----- Regarding: RE: Refill She can have doxycycline 100 mg BID for 15 days.Adfvise to not go out in bright sunlightfor long as it can cause skin discoloration  Kiran  ----- Message ----- From: Leontine Locket, CMA Sent: 09/06/2016   2:04 PM To: [name], MD Subject: Refill                                         Pt requesting Doxycycline 100mg  due to recurrence of symptoms. Advised patient that an office visit may be required prior to refill.   Please advise.

## 2016-09-07 NOTE — Telephone Encounter (Signed)
Advised patient of Rx refill and instructions per Dr. Vicente Males.  She can have doxycycline 100 mg BID for 15 days.Adfvise to not go out in bright sunlightfor long as it can cause skin discoloration

## 2016-09-14 ENCOUNTER — Inpatient Hospital Stay (HOSPITAL_BASED_OUTPATIENT_CLINIC_OR_DEPARTMENT_OTHER): Payer: BLUE CROSS/BLUE SHIELD | Admitting: Obstetrics and Gynecology

## 2016-09-14 ENCOUNTER — Other Ambulatory Visit: Payer: BLUE CROSS/BLUE SHIELD

## 2016-09-14 ENCOUNTER — Encounter: Payer: Self-pay | Admitting: *Deleted

## 2016-09-14 ENCOUNTER — Other Ambulatory Visit: Payer: Self-pay

## 2016-09-14 ENCOUNTER — Inpatient Hospital Stay: Payer: BLUE CROSS/BLUE SHIELD

## 2016-09-14 ENCOUNTER — Inpatient Hospital Stay: Payer: BLUE CROSS/BLUE SHIELD | Attending: Obstetrics and Gynecology

## 2016-09-14 ENCOUNTER — Ambulatory Visit: Payer: BLUE CROSS/BLUE SHIELD

## 2016-09-14 VITALS — BP 117/84 | HR 92 | Temp 98.7°F | Resp 18 | Ht 63.0 in | Wt 128.3 lb

## 2016-09-14 DIAGNOSIS — Z87891 Personal history of nicotine dependence: Secondary | ICD-10-CM

## 2016-09-14 DIAGNOSIS — Z90710 Acquired absence of both cervix and uterus: Secondary | ICD-10-CM | POA: Insufficient documentation

## 2016-09-14 DIAGNOSIS — R1030 Lower abdominal pain, unspecified: Secondary | ICD-10-CM

## 2016-09-14 DIAGNOSIS — R1012 Left upper quadrant pain: Secondary | ICD-10-CM | POA: Diagnosis not present

## 2016-09-14 DIAGNOSIS — Z006 Encounter for examination for normal comparison and control in clinical research program: Secondary | ICD-10-CM

## 2016-09-14 DIAGNOSIS — C569 Malignant neoplasm of unspecified ovary: Secondary | ICD-10-CM

## 2016-09-14 DIAGNOSIS — Z9221 Personal history of antineoplastic chemotherapy: Secondary | ICD-10-CM | POA: Insufficient documentation

## 2016-09-14 DIAGNOSIS — M1990 Unspecified osteoarthritis, unspecified site: Secondary | ICD-10-CM

## 2016-09-14 DIAGNOSIS — K219 Gastro-esophageal reflux disease without esophagitis: Secondary | ICD-10-CM | POA: Insufficient documentation

## 2016-09-14 DIAGNOSIS — F419 Anxiety disorder, unspecified: Secondary | ICD-10-CM | POA: Diagnosis not present

## 2016-09-14 DIAGNOSIS — Z90722 Acquired absence of ovaries, bilateral: Secondary | ICD-10-CM | POA: Diagnosis not present

## 2016-09-14 DIAGNOSIS — Z780 Asymptomatic menopausal state: Secondary | ICD-10-CM

## 2016-09-14 DIAGNOSIS — Z95828 Presence of other vascular implants and grafts: Secondary | ICD-10-CM

## 2016-09-14 LAB — BASIC METABOLIC PANEL
ANION GAP: 7 (ref 5–15)
BUN: 17 mg/dL (ref 6–20)
CALCIUM: 9.6 mg/dL (ref 8.9–10.3)
CO2: 25 mmol/L (ref 22–32)
CREATININE: 0.64 mg/dL (ref 0.44–1.00)
Chloride: 104 mmol/L (ref 101–111)
Glucose, Bld: 92 mg/dL (ref 65–99)
Potassium: 4 mmol/L (ref 3.5–5.1)
SODIUM: 136 mmol/L (ref 135–145)

## 2016-09-14 MED ORDER — SODIUM CHLORIDE 0.9% FLUSH
10.0000 mL | INTRAVENOUS | Status: DC | PRN
  Administered 2016-09-14: 10 mL via INTRAVENOUS
  Filled 2016-09-14: qty 10

## 2016-09-14 MED ORDER — HEPARIN SOD (PORK) LOCK FLUSH 100 UNIT/ML IV SOLN
500.0000 [IU] | Freq: Once | INTRAVENOUS | Status: AC
  Administered 2016-09-14: 500 [IU] via INTRAVENOUS

## 2016-09-14 NOTE — Progress Notes (Signed)
Debbie Richardson returns to clinic today for her 18 month follow up visit with Dr. [name] on the GOG 0225 research study. Patient complains today of some throbbing pain in her left lower abdomen, in an area that is below or under the site of her usual "stabbing" pain. Reports she is currently taking a round of Doxycycline prescribed by her GI physician, Dr. Bailey Mech, who feels this may be from some diverticulitis related to eating strawberries. Patient reports experiencing the new pain for about 4 weeks now. Although Ms. Defeo feels that the previous surgical scar tissue could be the cause of some of her pain, Dr. [name] states that since the pain is different and that Ovarian cancer can recur with tiny tumor areas, she wants to get a PET scan to rule out cancer recurrence. Patient agrees with this. Patient denies any other new problems, but reports continued loss of smell. States she receives calls from the study about every 6 weeks, but does not feel they are beneficial to her at all and questions how the study could be getting anything of value from these conversations. Discussed that she will not have to complete any questionnaires until her 24 month visit in December, and that we will need to get an early morning appointment because the study labs due require her to be fasting. VS are stable: B/P today is 117/84. Temp was 98.7 F. Waist measures 77.5cm (30.5 inches) this morning and her weight is 58.2kg (128 lbs - 5 oz). All of these values are similar to her last appointment. Patient discusses that she feels she has some muscle "wasting" and is not sure whether this change is related to aging, or the hysterectomy she had 2 years ago. Patient will have a port-a-cath flush today as well as a CA-125 level. Dr. Rogue Bussing had scheduled a CT of abdomen and pelvis in September, but will cancel this in liu of the PET scan, scheduled 09/19/16 at 8:30am. Dr. [name] will follow up the scan results by phone. Patient has  return appt to see Dr. Rogue Bussing in 3 months. Current AE's with grade and attribution as follows:   Adverse Event Log  Study/Protocol: GOG 0225 Cycle: Month 15-18 visit Event Grade Onset Date Resolved Date Drug Name no drug Attribution Treatment Comments  Left pelvic pain worsening in past 4 weeks Grade 2 04/26/15 ongoing n/a Unlikely none Pt feels due to diverticulitis  Diverticulosis "GI other" Grade 1 06/24/15 Ongoing; improved n/a Unrelated none Per CT scan  Inflammation of joints Grade 1 07/06/15 approx.  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

## 2016-09-14 NOTE — Patient Instructions (Signed)
Venlafaxine extended-release capsules  What is this medicine?  VENLAFAXINE(VEN la fax een) is used to treat depression, anxiety and panic disorder.  This medicine may be used for other purposes; ask your health care provider or pharmacist if you have questions.  COMMON BRAND NAME(S): Effexor XR  What should I tell my health care provider before I take this medicine?  They need to know if you have any of these conditions:  -bleeding disorders  -glaucoma  -heart disease  -high blood pressure  -high cholesterol  -kidney disease  -liver disease  -low levels of sodium in the blood  -mania or bipolar disorder  -seizures  -suicidal thoughts, plans, or attempt; a previous suicide attempt by you or a family  -take medicines that treat or prevent blood clots  -thyroid disease  -an unusual or allergic reaction to venlafaxine, desvenlafaxine, other medicines, foods, dyes, or preservatives  -pregnant or trying to get pregnant  -breast-feeding  How should I use this medicine?  Take this medicine by mouth with a full glass of water. Follow the directions on the prescription label. Do not cut, crush, or chew this medicine. Take it with food. If needed, the capsule may be carefully opened and the entire contents sprinkled on a spoonful of cool applesauce. Swallow the applesauce/pellet mixture right away without chewing and follow with a glass of water to ensure complete swallowing of the pellets. Try to take your medicine at about the same time each day. Do not take your medicine more often than directed. Do not stop taking this medicine suddenly except upon the advice of your doctor. Stopping this medicine too quickly may cause serious side effects or your condition may worsen.  A special MedGuide will be given to you by the pharmacist with each prescription and refill. Be sure to read this information carefully each time.  Talk to your pediatrician regarding the use of this medicine in children. Special care may be  needed.  Overdosage: If you think you have taken too much of this medicine contact a poison control center or emergency room at once.  NOTE: This medicine is only for you. Do not share this medicine with others.  What if I miss a dose?  If you miss a dose, take it as soon as you can. If it is almost time for your next dose, take only that dose. Do not take double or extra doses.  What may interact with this medicine?  Do not take this medicine with any of the following medications:  -certain medicines for fungal infections like fluconazole, itraconazole, ketoconazole, posaconazole, voriconazole  -cisapride  -desvenlafaxine  -dofetilide  -dronedarone  -duloxetine  -levomilnacipran  -linezolid  -MAOIs like Carbex, Eldepryl, Marplan, Nardil, and Parnate  -methylene blue (injected into a vein)  -milnacipran  -pimozide  -thioridazine  -ziprasidone  This medicine may also interact with the following medications:  -amphetamines  -aspirin and aspirin-like medicines  -certain medicines for depression, anxiety, or psychotic disturbances  -certain medicines for migraine headaches like almotriptan, eletriptan, frovatriptan, naratriptan, rizatriptan, sumatriptan, zolmitriptan  -certain medicines for sleep  -certain medicines that treat or prevent blood clots like dalteparin, enoxaparin, warfarin  -cimetidine  -clozapine  -diuretics  -fentanyl  -furazolidone  -indinavir  -isoniazid  -lithium  -metoprolol  -NSAIDS, medicines for pain and inflammation, like ibuprofen or naproxen  -other medicines that prolong the QT interval (cause an abnormal heart rhythm)  -procarbazine  -rasagiline  -supplements like St. John's wort, kava kava, valerian  -tramadol  -tryptophan    worse. Visit your doctor or health care professional for regular checks on your progress. Because it may take several weeks to see the full effects of this medicine, it is important to continue your treatment as prescribed by your doctor. Patients and their families should watch out for new or worsening thoughts of suicide or depression. Also watch out for sudden changes in feelings such as feeling anxious, agitated, panicky, irritable, hostile, aggressive, impulsive, severely restless, overly excited and hyperactive, or not being able to sleep. If this happens, especially at the beginning of treatment or after a change in dose, call your health care professional. This medicine can cause an increase in blood pressure. Check with your doctor for instructions on monitoring your blood pressure while taking this medicine. You may get drowsy or dizzy. Do not drive, use machinery, or do anything that needs mental alertness until you know how this medicine affects you. Do not stand or sit up quickly, especially if you are an older patient. This reduces the risk of dizzy or fainting spells. Alcohol may interfere with the effect of this medicine. Avoid alcoholic drinks. Your mouth may get dry. Chewing sugarless gum, sucking hard candy and drinking plenty of water will help. Contact your doctor if the problem does not go away or is severe. What side effects may I notice from receiving this medicine? Side effects that you should report to your doctor or health care professional as soon as possible: -allergic reactions like skin rash, itching or hives, swelling of the face, lips, or tongue -anxious -breathing problems -confusion -changes in vision -chest pain -confusion -elevated mood, decreased need for sleep, racing thoughts, impulsive behavior -eye pain -fast, irregular  heartbeat -feeling faint or lightheaded, falls -feeling agitated, angry, or irritable -hallucination, loss of contact with reality -high blood pressure -loss of balance or coordination -palpitations -redness, blistering, peeling or loosening of the skin, including inside the mouth -restlessness, pacing, inability to keep still -seizures -stiff muscles -suicidal thoughts or other mood changes -trouble passing urine or change in the amount of urine -trouble sleeping -unusual bleeding or bruising -unusually weak or tired -vomiting Side effects that usually do not require medical attention (report to your doctor or health care professional if they continue or are bothersome): -change in sex drive or performance -change in appetite or weight -constipation -dizziness -dry mouth -headache -increased sweating -nausea -tired This list may not describe all possible side effects. Call your doctor for medical advice about side effects. You may report side effects to FDA at 1-800-FDA-1088. Where should I keep my medicine? Keep out of the reach of children. Store at a controlled temperature between 20 and 25 degrees C (68 degrees and 77 degrees F), in a dry place. Throw away any unused medicine after the expiration date. NOTE: This sheet is a summary. It may not cover all possible information. If you have questions about this medicine, talk to your doctor, pharmacist, or health care provider.  2018 Elsevier/Gold Standard (2015-08-20 18:38:02)   Menopause Menopause is the normal time of life when menstrual periods stop completely. Menopause is complete when you have missed 12 consecutive menstrual periods. It usually occurs between the ages of 63 years and 46 years. Very rarely does a woman develop menopause before the age of 7 years. At menopause, your ovaries stop producing the female hormones estrogen and progesterone. This can cause undesirable symptoms and also affect your health.  Sometimes the symptoms may occur 4-5 years before the menopause begins. There is no relationship  between menopause and:  Oral contraceptives.  Number of children you had.  Race.  The age your menstrual periods started (menarche).  Heavy smokers and very thin women may develop menopause earlier in life. What are the causes?  The ovaries stop producing the female hormones estrogen and progesterone. Other causes include:  Surgery to remove both ovaries.  The ovaries stop functioning for no known reason.  Tumors of the pituitary gland in the brain.  Medical disease that affects the ovaries and hormone production.  Radiation treatment to the abdomen or pelvis.  Chemotherapy that affects the ovaries.  What are the signs or symptoms?  Hot flashes.  Night sweats.  Decrease in sex drive.  Vaginal dryness and thinning of the vagina causing painful intercourse.  Dryness of the skin and developing wrinkles.  Headaches.  Tiredness.  Irritability.  Memory problems.  Weight gain.  Bladder infections.  Hair growth of the face and chest.  Infertility. More serious symptoms include:  Loss of bone (osteoporosis) causing breaks (fractures).  Depression.  Hardening and narrowing of the arteries (atherosclerosis) causing heart attacks and strokes.  How is this diagnosed?  When the menstrual periods have stopped for 12 straight months.  Physical exam.  Hormone studies of the blood. How is this treated? There are many treatment choices and nearly as many questions about them. The decisions to treat or not to treat menopausal changes is an individual choice made with your health care provider. Your health care provider can discuss the treatments with you. Together, you can decide which treatment will work best for you. Your treatment choices may include:  Hormone therapy (estrogen and progesterone).  Non-hormonal medicines.  Treating the individual symptoms with  medicine (for example antidepressants for depression).  Herbal medicines that may help specific symptoms.  Counseling by a psychiatrist or psychologist.  Group therapy.  Lifestyle changes including: ? Eating healthy. ? Regular exercise. ? Limiting caffeine and alcohol. ? Stress management and meditation.  No treatment.  Follow these instructions at home:  Take the medicine your health care provider gives you as directed.  Get plenty of sleep and rest.  Exercise regularly.  Eat a diet that contains calcium (good for the bones) and soy products (acts like estrogen hormone).  Avoid alcoholic beverages.  Do not smoke.  If you have hot flashes, dress in layers.  Take supplements, calcium, and vitamin D to strengthen bones.  You can use over-the-counter lubricants or moisturizers for vaginal dryness.  Group therapy is sometimes very helpful.  Acupuncture may be helpful in some cases. Contact a health care provider if:  You are not sure you are in menopause.  You are having menopausal symptoms and need advice and treatment.  You are still having menstrual periods after age 55 years.  You have pain with intercourse.  Menopause is complete (no menstrual period for 12 months) and you develop vaginal bleeding.  You need a referral to a specialist (gynecologist, psychiatrist, or psychologist) for treatment. Get help right away if:  You have severe depression.  You have excessive vaginal bleeding.  You fell and think you have a broken bone.  You have pain when you urinate.  You develop leg or chest pain.  You have a fast pounding heart beat (palpitations).  You have severe headaches.  You develop vision problems.  You feel a lump in your breast.  You have abdominal pain or severe indigestion. This information is not intended to replace advice given to you by your health  care provider. Make sure you discuss any questions you have with your health care  provider. Document Released: 06/11/2003 Document Revised: 08/27/2015 Document Reviewed: 10/18/2012 Elsevier Interactive Patient Education  2017 [employer].

## 2016-09-14 NOTE — Progress Notes (Signed)
Gynecologic Oncology Interval Note  Referring Provider: Dr. Forest Gleason, Dr. 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 She is on GOG225.   She saw Dr. Rogue Bussing on 06/09/2016 and had a negative exam but complained left upper quad discomfort- unclear etiology ? She was started on doxycycline with improvement in her symptoms. She continue to follow-up with GI.   She has a CT scan scheduled for 12/09/2016.    CT scan of abdomen and pelvis 12/09/2015 IMPRESSION: Status post hysterectomy and bilateral salpingo-oophorectomy.  No evidence of recurrent or metastatic disease  Lab Results  Component Value Date   CA125 9.9 06/09/2016   She is on the GOG225 study-control arm.   She also has multiple other complaints: persistent abdominal/pelvic LUQ pain that improved with doxycycline (no pain today); abdominal pain and distension; constipation; SOB/cough,  and back pain.   Oncology Treatment History:  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 See prior notes for complete details. She was taken emergently to the operating room on 04/28/2014 by Dr. Glennon Mac and a laparoscopic procedure was performed.  Bilateral salpingo-oophorectomy with  placement of the cystic lesions in the Endo Catch bag and removal through the LLQ port. Per the operative note the right ovary did rupture and contents of the cysts were spilled into the pelvis.  The final pathology revealed high-grade serous adenocarcinoma involving both ovaries and fallopian tubes. Washings also revealed clusters of highly atypical glandular cells and surface involvement of the ovary was present. Staging was felt to be stage II at least high grade  serous ovarian cancer. Preoperative CA125 was 45.   She received 6 cycles of carboplatin/taxane chemotherapy via an IV port, last treatment June 2016.  She had an allergy to taxol and was switched to taxotere for the last 4 cycles. She underwent interval TLH/omentectomy on 10/29/2014 for ovarian cancer. Her final pathology was negative. She enrolled on GOG225.   CT scan 01/09/2015 negative  CA125 results postchemotherapy  02/11/2015 10.4 03/12/2015 10.7 06/10/2015 10.0 03/15/2016  10.1  CT scan 06/24/2015 IMPRESSION: No evidence of urolithiasis, hydronephrosis, or other acute findings. Colonic diverticulosis. No radiographic evidence of diverticulitis.    Genetic testing was negative.   Screening mammogram on5/07/2016 negative ordered by Dr. Rogue Bussing   Problem List: Patient Active Problem List   Diagnosis Date Noted  . Menopause 09/14/2016  . Malignant neoplasm of ovary ([place]) 12/09/2015  . Sinus pressure 08/17/2015  . Acute sinusitis 08/11/2015  . Left flank pain 06/22/2015  . Abdominal tenderness of left lower quadrant 06/22/2015  . Lower abdominal pain 11/26/2014  . Status post laparoscopic hysterectomy 10/29/2014  . Allergic conjunctivitis and rhinitis 07/04/2012  . GERD (gastroesophageal reflux disease) 05/01/2007  . PSORIASIS NEC 12/07/2006    Past Medical History: Past Medical History:  Diagnosis Date  . Anxiety   . Arthritis   . Benign fundic gland polyps of stomach   . Dizziness    vertigo  . Gallstones   . GERD (gastroesophageal reflux disease)   . Ovarian cancer ([place]) 2016    Past Surgical History: Past Surgical History:  Procedure Laterality Date  . CESAREAN SECTION    . CHOLECYSTECTOMY    . COLONOSCOPY    . LAPAROSCOPIC HYSTERECTOMY N/A 10/29/2014  Procedure: HYSTERECTOMY TOTAL LAPAROSCOPIC, OMENTUMECTOMY;  Surgeon: Gillis Ends, MD;  Location: [facility] ORS;  Service: Gynecology;  Laterality: N/A;  . LAPAROSCOPIC OOPHERECTOMY    .  OOPHORECTOMY    . UPPER GI ENDOSCOPY      Family History: Family History  Problem Relation Age of Onset  . Hypertension Mother   . Hyperlipidemia Mother   . GER disease Mother   . Heart disease Father   . Heart disease Paternal Grandmother   . Breast cancer Paternal Grandmother 76  . Kidney disease Neg Hx   . Liver disease Neg Hx   . Colon cancer Neg Hx   . Colon polyps Neg Hx   . Esophageal cancer Neg Hx   . Pancreatic cancer Neg Hx     Social History: Social History   Social History  . Marital status: Married    Spouse name: N/A  . Number of children: 1  . Years of education: N/A   Occupational History  . Not on file.   Social History Main Topics  . Smoking status: Former Smoker    Quit date: 04/05/1991  . Smokeless tobacco: Never Used  . Alcohol use 0.0 oz/week     Comment: 1 drink of wine daily  . Drug use: No  . Sexual activity: Yes   Other Topics Concern  . Not on file   Social History Narrative  . No narrative on file    Allergies: Allergies  Allergen Reactions  . Ciprofloxacin Anaphylaxis  . Paclitaxel Shortness Of Breath and Other (See Comments)    Other reaction(s): Tight chest (finding) Chest and facial flushing Flushing, chest tightness, SOB w/ Taxol on 05/26/14 at [place].  . Flagyl [Metronidazole] Other (See Comments)    headache    Current Medications: Current Outpatient Prescriptions  Medication Sig Dispense Refill  . doxycycline (VIBRAMYCIN) 100 MG capsule Take 1 capsule (100 mg total) by mouth 2 (two) times daily. 30 capsule 0  . omeprazole (PRILOSEC) 40 MG capsule Take 1 capsule (40 mg total) by mouth daily. 30 capsule 2  . acetaminophen (TYLENOL) 500 MG tablet Take 2 tablets (1,000 mg total) by mouth every 6 (six) hours. (Patient taking differently: Take 1,000 mg by mouth every 6 (six) hours as needed for mild pain, moderate pain, fever or headache. ) 30 tablet 0   No current facility-administered medications for this visit.     Facility-Administered Medications Ordered in Other Visits  Medication Dose Route Frequency Provider Last Rate Last Dose  . sodium chloride 0.9 % injection 10 mL  10 mL Intravenous PRN Forest Gleason, MD   10 mL at 11/19/14 1628  . sodium chloride flush (NS) 0.9 % injection 10 mL  10 mL Intravenous PRN Cammie Sickle, MD   10 mL at 10/28/15 1135    Genetic Testing: negative   Review of Systems General: negative  HEENT: negative  Lungs: SOB/cough  Cardiac: no complaints  GI: as per interval history  GU: h/o bladder issues; negative today  Musculoskeletal: back pain, chronic  Extremities: no complaints  Skin: no complaints  Neuro: peripheral neuropathy  Endocrine: no complaints  Psych: no complaints  Allergy complaints - seasonal       Objective:   Vitals:   09/14/16 0903  BP: 117/84  Pulse: 92  Resp: 18  Temp: 98.7 F (37.1 C)    Body mass index is 22.73 kg/m.  ECOG Performance Status: 0 - Asymptomatic  General appearance: alert, cooperative and appears stated  age HEENT:PERRLA, extra ocular movement intact, sclera clear, anicteric CV: RRR Lungs: B CTA Lymph node survey: Negative for axillary, supraclavicular or inguinal adenopathy Abdomen: soft, nontender and nondistended. No hernias, masses, infection or ascites and incisions well healed.  Back: negative for CVAT Extremities: extremities normal, atraumatic, no cyanosis or edema Neurological exam reveals alert, oriented, normal speech, no focal findings or movement disorder noted.  Pelvic: exam chaperoned by nurse;  Vulva: normal appearing vulva with no masses, tenderness or lesions; Vagina: normal vagina and cuff healed; BME: nontender and no masses; UterusCervix: surgically absent. Rectal: negative for masses or nodularity  Lab Results  Component Value Date   CA125 9.9 06/09/2016   CA125 10.1 03/15/2016   CA125 10.6 12/11/2015    Assessment:  Debbie Richardson is a 56 y.o. female with a history  of stage II at least high grade serous ovarian cancer. Status post LS BSO and 6 cycles of platin/taxane chemotherapy.  No evidence of disease on CT or exam and CA125 normal.  Interval TLH/Omentectomy 7/16 negative for malignancy. Persistent and slightly worsening abdominal pain, CT scan negative for metastatic disease, diverticular disease present. Persistent and changing character of pain.   Menopausal symptoms.   Multiple medical complaints and issues.     Plan:   Problem List Items Addressed This Visit      Genitourinary   Malignant neoplasm of ovary ([place]) - Primary     Other   Lower abdominal pain   Menopause     Given her continued abdominal GI complaints and new character of pain I recommended a PET scan to rule out recurrence. She will also follow up with GI.   Continue on GOG225 and spoke with [facility] today. She will see Dr. Rogue Bussing for her next visit in 3 months and return to our clinic in 6 months as long as imaging negative.   She was given information regarding menopausal management and Effexor to treatment of vasomotor symptoms.   Gillis Ends, MD  CC:  Dr. Prentice Docker  Dr. Blain Pais

## 2016-09-14 NOTE — Progress Notes (Signed)
  Oncology Nurse Navigator Documentation Chaperoned pelvic exam. Orders entered for PET. Cancel CT scheduled in September. Navigator Location: [facility] (09/14/16 0900)   )Navigator Encounter Type: Follow-up Appt (09/14/16 0900)                                                    Time Spent with Patient: 15 (09/14/16 0900)

## 2016-09-14 NOTE — Progress Notes (Signed)
Pt has chronic pain in left lower pelvic area and she has seen dr Vicente Males several times and he feels like it is effects from her surgery with scar tissue issues and when she has flare up which she feels is her diet not doing as good as she should for  Example eating strawberries, then she goes on doxycyline.

## 2016-09-15 LAB — CA 125: CA 125: 10.9 U/mL (ref 0.0–38.1)

## 2016-09-19 ENCOUNTER — Telehealth: Payer: Self-pay

## 2016-09-19 ENCOUNTER — Ambulatory Visit
Admission: RE | Admit: 2016-09-19 | Discharge: 2016-09-19 | Disposition: A | Discharge status: Home / Self Care | Payer: BLUE CROSS/BLUE SHIELD | Source: Ambulatory Visit | Attending: Obstetrics and Gynecology | Admitting: Obstetrics and Gynecology

## 2016-09-19 DIAGNOSIS — C569 Malignant neoplasm of unspecified ovary: Secondary | ICD-10-CM | POA: Insufficient documentation

## 2016-09-19 LAB — GLUCOSE, CAPILLARY: GLUCOSE-CAPILLARY: 93 mg/dL (ref 65–99)

## 2016-09-19 MED ORDER — FLUDEOXYGLUCOSE F - 18 (FDG) INJECTION
12.0000 | Freq: Once | INTRAVENOUS | Status: AC | PRN
  Administered 2016-09-19: 12.84 via INTRAVENOUS

## 2016-09-19 NOTE — Telephone Encounter (Signed)
  Oncology Nurse Navigator Documentation Ms. Dicesare notified of PET results  IMPRESSION: Negative PET-CT.  No findings for hypermetabolic recurrent tumor.     PET results routed to Dr. Vicente Males per request to see if CT portion of PET would benefit his review regarding her abdominal symptoms. Navigator Location: [facility] (09/19/16 1400)   )Navigator Encounter Type: Diagnostic Results;Telephone (09/19/16 1400)                                                    Time Spent with Patient: 15 (09/19/16 1400)

## 2016-09-26 ENCOUNTER — Telehealth: Payer: Self-pay

## 2016-09-26 NOTE — Telephone Encounter (Signed)
Advised patient of results per Dr. Vicente Males.   Inform PET review and no suggestion of any malignancy and was essentially normal .

## 2016-09-26 NOTE — Telephone Encounter (Signed)
-----   Message from [name], MD sent at 09/25/2016  4:05 PM EDT ----- Regarding: FW: PET review Inform PET review and no suggestion of any malignancy and was essentially normal .  Regards  Kiran  ----- Message ----- From: [name], RN Sent: 09/19/2016   2:11 PM To: [name], MD Subject: PET review                                     Hello, I have routed you her PET results per her request. She was asking if the Ct portion would benefit your review at all regarding her abdominal symptoms.

## 2016-10-26 ENCOUNTER — Inpatient Hospital Stay: Payer: BLUE CROSS/BLUE SHIELD

## 2016-10-28 ENCOUNTER — Encounter: Payer: Self-pay | Admitting: *Deleted

## 2016-10-28 ENCOUNTER — Inpatient Hospital Stay: Payer: BLUE CROSS/BLUE SHIELD | Attending: Internal Medicine

## 2016-10-28 DIAGNOSIS — Z006 Encounter for examination for normal comparison and control in clinical research program: Secondary | ICD-10-CM | POA: Diagnosis not present

## 2016-10-28 DIAGNOSIS — Z9221 Personal history of antineoplastic chemotherapy: Secondary | ICD-10-CM | POA: Diagnosis not present

## 2016-10-28 DIAGNOSIS — Z8543 Personal history of malignant neoplasm of ovary: Secondary | ICD-10-CM | POA: Insufficient documentation

## 2016-10-28 DIAGNOSIS — Z452 Encounter for adjustment and management of vascular access device: Secondary | ICD-10-CM | POA: Diagnosis not present

## 2016-10-28 DIAGNOSIS — Z95828 Presence of other vascular implants and grafts: Secondary | ICD-10-CM

## 2016-10-28 MED ORDER — SODIUM CHLORIDE 0.9% FLUSH
10.0000 mL | INTRAVENOUS | Status: AC | PRN
  Administered 2016-10-28: 10 mL via INTRAVENOUS
  Filled 2016-10-28: qty 10

## 2016-10-28 MED ORDER — HEPARIN SOD (PORK) LOCK FLUSH 100 UNIT/ML IV SOLN
500.0000 [IU] | Freq: Once | INTRAVENOUS | Status: AC
  Administered 2016-10-28: 500 [IU] via INTRAVENOUS

## 2016-12-07 ENCOUNTER — Inpatient Hospital Stay: Payer: BLUE CROSS/BLUE SHIELD

## 2016-12-09 ENCOUNTER — Ambulatory Visit: Payer: BLUE CROSS/BLUE SHIELD

## 2016-12-09 ENCOUNTER — Telehealth: Payer: Self-pay | Admitting: Family Medicine

## 2016-12-09 NOTE — Telephone Encounter (Signed)
She may also be able to do an e-visit?  Please let her know

## 2016-12-09 NOTE — Telephone Encounter (Signed)
Left voicemail letting pt know she may want to try an E-visit

## 2016-12-09 NOTE — Telephone Encounter (Signed)
Patient Name: Debbie Richardson DOB: 28-Apr-1960 Initial Comment Caller states, she has a cold, thought possible touch of sinus infection - going on vacation this afternoon. Nurse Assessment Nurse: Lavera Guise RN, Vaughan Basta Date/Time (Eastern Time): 12/09/2016 2:22:48 PM Confirm and document reason for call. If symptomatic, describe symptoms. ---Caller states she has had a cold and thinks may be turning into a sinus infection. On right side of face. Having face pain. Upper teeth bothering her. No fever. Started 2 weeks ago. Does the patient have any new or worsening symptoms? ---Yes Will a triage be completed? ---Yes Related visit to physician within the last 2 weeks? ---No Does the PT have any chronic conditions? (i.e. diabetes, asthma, etc.) ---Yes List chronic conditions. ---hx of CA Is this a behavioral health or substance abuse call? ---No Guidelines Guideline Title Affirmed Question Affirmed Notes Sinus Pain or Congestion [1] Sinus congestion (pressure, fullness) AND [2] present > 10 days Final Disposition User See PCP When Office is Open (within 3 days) [place], RN, [employer] states she attempted to make an appointment for today, but there were none available. States she try to get by but will go to urgent care if she continues to decline. States she is going on vacation tomorrow. Disagree/Comply: Comply

## 2016-12-14 ENCOUNTER — Inpatient Hospital Stay: Payer: BLUE CROSS/BLUE SHIELD

## 2016-12-14 ENCOUNTER — Encounter: Payer: Self-pay | Admitting: *Deleted

## 2016-12-14 ENCOUNTER — Inpatient Hospital Stay (HOSPITAL_BASED_OUTPATIENT_CLINIC_OR_DEPARTMENT_OTHER): Payer: BLUE CROSS/BLUE SHIELD | Admitting: Internal Medicine

## 2016-12-14 ENCOUNTER — Inpatient Hospital Stay: Payer: BLUE CROSS/BLUE SHIELD | Attending: Internal Medicine

## 2016-12-14 VITALS — BP 115/82 | HR 64 | Temp 98.6°F | Resp 16 | Wt 132.6 lb

## 2016-12-14 DIAGNOSIS — Z95828 Presence of other vascular implants and grafts: Secondary | ICD-10-CM

## 2016-12-14 DIAGNOSIS — C561 Malignant neoplasm of right ovary: Secondary | ICD-10-CM

## 2016-12-14 DIAGNOSIS — Z9221 Personal history of antineoplastic chemotherapy: Secondary | ICD-10-CM

## 2016-12-14 DIAGNOSIS — Z79899 Other long term (current) drug therapy: Secondary | ICD-10-CM

## 2016-12-14 DIAGNOSIS — K219 Gastro-esophageal reflux disease without esophagitis: Secondary | ICD-10-CM | POA: Diagnosis not present

## 2016-12-14 DIAGNOSIS — C563 Malignant neoplasm of bilateral ovaries: Secondary | ICD-10-CM

## 2016-12-14 DIAGNOSIS — Z8543 Personal history of malignant neoplasm of ovary: Secondary | ICD-10-CM | POA: Insufficient documentation

## 2016-12-14 DIAGNOSIS — Z90710 Acquired absence of both cervix and uterus: Secondary | ICD-10-CM

## 2016-12-14 DIAGNOSIS — Z006 Encounter for examination for normal comparison and control in clinical research program: Secondary | ICD-10-CM

## 2016-12-14 DIAGNOSIS — C569 Malignant neoplasm of unspecified ovary: Secondary | ICD-10-CM

## 2016-12-14 DIAGNOSIS — Z90722 Acquired absence of ovaries, bilateral: Secondary | ICD-10-CM | POA: Insufficient documentation

## 2016-12-14 DIAGNOSIS — Z87891 Personal history of nicotine dependence: Secondary | ICD-10-CM | POA: Diagnosis not present

## 2016-12-14 DIAGNOSIS — M1990 Unspecified osteoarthritis, unspecified site: Secondary | ICD-10-CM | POA: Insufficient documentation

## 2016-12-14 DIAGNOSIS — C562 Malignant neoplasm of left ovary: Principal | ICD-10-CM

## 2016-12-14 LAB — COMPREHENSIVE METABOLIC PANEL
ALBUMIN: 4.5 g/dL (ref 3.5–5.0)
ALK PHOS: 51 U/L (ref 38–126)
ALT: 17 U/L (ref 14–54)
ANION GAP: 9 (ref 5–15)
AST: 21 U/L (ref 15–41)
BILIRUBIN TOTAL: 0.9 mg/dL (ref 0.3–1.2)
BUN: 12 mg/dL (ref 6–20)
CALCIUM: 9 mg/dL (ref 8.9–10.3)
CO2: 24 mmol/L (ref 22–32)
CREATININE: 0.68 mg/dL (ref 0.44–1.00)
Chloride: 104 mmol/L (ref 101–111)
GFR calc Af Amer: >60 mL/min (ref >60)
GFR calc non Af Amer: >60 mL/min (ref >60)
GLUCOSE: 96 mg/dL (ref 65–99)
Potassium: 3.6 mmol/L (ref 3.5–5.1)
Sodium: 137 mmol/L (ref 135–145)
TOTAL PROTEIN: 6.8 g/dL (ref 6.5–8.1)

## 2016-12-14 LAB — CBC WITH DIFFERENTIAL/PLATELET
BASOS PCT: 1 %
Basophils Absolute: 0 10*3/uL (ref 0–0.1)
Eosinophils Absolute: 0 10*3/uL (ref 0–0.7)
Eosinophils Relative: 1 %
HEMATOCRIT: 34.7 % — AB (ref 35.0–47.0)
HEMOGLOBIN: 12.3 g/dL (ref 12.0–16.0)
LYMPHS ABS: 1.6 10*3/uL (ref 1.0–3.6)
Lymphocytes Relative: 32 %
MCH: 32.6 pg (ref 26.0–34.0)
MCHC: 35.5 g/dL (ref 32.0–36.0)
MCV: 91.9 fL (ref 80.0–100.0)
MONOS PCT: 4 %
Monocytes Absolute: 0.2 10*3/uL (ref 0.2–0.9)
NEUTROS ABS: 3 10*3/uL (ref 1.4–6.5)
NEUTROS PCT: 62 %
Platelets: 212 10*3/uL (ref 150–440)
RBC: 3.77 MIL/uL — ABNORMAL LOW (ref 3.80–5.20)
RDW: 13 % (ref 11.5–14.5)
WBC: 4.9 10*3/uL (ref 3.6–11.0)

## 2016-12-14 MED ORDER — HEPARIN SOD (PORK) LOCK FLUSH 100 UNIT/ML IV SOLN
500.0000 [IU] | Freq: Once | INTRAVENOUS | Status: AC
  Administered 2016-12-14: 500 [IU] via INTRAVENOUS

## 2016-12-14 MED ORDER — SODIUM CHLORIDE 0.9% FLUSH
10.0000 mL | INTRAVENOUS | Status: DC | PRN
  Administered 2016-12-14: 10 mL via INTRAVENOUS
  Filled 2016-12-14: qty 10

## 2016-12-14 NOTE — Progress Notes (Signed)
Patient is here today for a follow up. Patient states no new concerns today.  

## 2016-12-14 NOTE — Progress Notes (Signed)
[facility] OFFICE PROGRESS NOTE  Patient Care Team: Tower, [name], MD as PCP - General [name], RN as Registered Nurse  Cancer Staging No matching staging information was found for the patient.    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  Finished total 6 cycles of chemotherapy with carboplatinum and Taxotere June of 2016  # Patient underwent exploratory laparotomy bilateral oophorectomy and omentectomy in July of 2016 evidence of malignancy was found.  CT SEP 6th 2017- NED  # GOG 0225 [diet/excerise- on control arm]   # Severe reaction to Taxol; also post de-sensitization [hives]     Ovarian cancer, bilateral ([place])   12/09/2015 Initial Diagnosis    Ovarian cancer, bilateral ([place])        INTERVAL HISTORY:  Debbie Richardson 56 y.o.  female pleasant patient above history of Stage II ovarian cancer currently on surveillance is here for follow-up.  Abdominal discomfort symptoms are improved; currently followed by GI [question bacterial overgrowth syndrome.]. Otherwise no nausea no vomiting. No abdominal distention.  REVIEW OF SYSTEMS:  A complete 10 point review of system is done which is negative except mentioned above/history of present illness.   PAST MEDICAL HISTORY :  Past Medical History:  Diagnosis Date  . Anxiety   . Arthritis   . Benign fundic gland polyps of stomach   . Dizziness    vertigo  . Gallstones   . GERD (gastroesophageal reflux disease)   . Ovarian cancer ([place]) 2016    PAST SURGICAL HISTORY :   Past Surgical History:  Procedure Laterality Date  . CESAREAN SECTION    . CHOLECYSTECTOMY    . COLONOSCOPY    . LAPAROSCOPIC HYSTERECTOMY N/A 10/29/2014   Procedure: HYSTERECTOMY TOTAL LAPAROSCOPIC, OMENTUMECTOMY;  Surgeon: Gillis Ends, MD;  Location: [facility] ORS;  Service: Gynecology;  Laterality: N/A;  . LAPAROSCOPIC  OOPHERECTOMY    . OOPHORECTOMY    . UPPER GI ENDOSCOPY      FAMILY HISTORY :   Family History  Problem Relation Age of Onset  . Hypertension Mother   . Hyperlipidemia Mother   . GER disease Mother   . Heart disease Father   . Heart disease Paternal Grandmother   . Breast cancer Paternal Grandmother 88  . Kidney disease Neg Hx   . Liver disease Neg Hx   . Colon cancer Neg Hx   . Colon polyps Neg Hx   . Esophageal cancer Neg Hx   . Pancreatic cancer Neg Hx     SOCIAL HISTORY:   Social History  Substance Use Topics  . Smoking status: Former Smoker    Quit date: 04/05/1991  . Smokeless tobacco: Never Used  . Alcohol use 0.0 oz/week     Comment: 1 drink of wine daily    ALLERGIES:  is allergic to ciprofloxacin; paclitaxel; and flagyl [metronidazole].  MEDICATIONS:  Current Outpatient Prescriptions  Medication Sig Dispense Refill  . acetaminophen (TYLENOL) 500 MG tablet Take 2 tablets (1,000 mg total) by mouth every 6 (six) hours. (Patient taking differently: Take 1,000 mg by mouth every 6 (six) hours as needed for mild pain, moderate pain, fever or headache. ) 30 tablet 0  . omeprazole (PRILOSEC) 40 MG capsule Take 1 capsule (40 mg total) by mouth daily. 30 capsule 2   No current facility-administered medications for this visit.    Facility-Administered Medications Ordered in Other Visits  Medication Dose Route Frequency Provider Last Rate Last Dose  . sodium chloride 0.9 % injection 10 mL  10 mL Intravenous PRN Forest Gleason, MD   10 mL at 11/19/14 1628  . sodium chloride flush (NS) 0.9 % injection 10 mL  10 mL Intravenous PRN Cammie Sickle, MD   10 mL at 10/28/15 1135  . sodium chloride flush (NS) 0.9 % injection 10 mL  10 mL Intravenous PRN Cammie Sickle, MD   10 mL at 10/28/16 1604    PHYSICAL EXAMINATION: ECOG PERFORMANCE STATUS: 0 - Asymptomatic  BP 115/82 (BP Location: Left Arm, Patient Position: Sitting)   Pulse 64   Temp 98.6 F (37 C)  (Tympanic)   Resp 16   Wt 132 lb 9.6 oz (60.1 kg)   LMP 03/30/2013   BMI 23.49 kg/m   Filed Weights   12/14/16 1427  Weight: 132 lb 9.6 oz (60.1 kg)    GENERAL: Well-nourished well-developed; Alert, no distress and comfortable.   alone EYES: no pallor or icterus OROPHARYNX: no thrush or ulceration; good dentition  NECK: supple, no masses felt LYMPH:  no palpable lymphadenopathy in the cervical, axillary or inguinal regions LUNGS: clear to auscultation and  No wheeze or crackles HEART/CVS: regular rate & rhythm and no murmurs; No lower extremity edema ABDOMEN:abdomen soft, non-tender and normal bowel sounds Musculoskeletal:no cyanosis of digits and no clubbing  PSYCH: alert & oriented x 3 with fluent speech NEURO: no focal motor/sensory deficits SKIN:  no rashes or significant lesions  LABORATORY DATA:  I have reviewed the data as listed    Component Value Date/Time   NA 137 12/14/2016 1356   NA 135 07/14/2014 0955   K 3.6 12/14/2016 1356   K 3.2 (L) 07/21/2014 1518   CL 104 12/14/2016 1356   CL 106 07/14/2014 0955   CO2 24 12/14/2016 1356   CO2 21 (L) 07/14/2014 0955   GLUCOSE 96 12/14/2016 1356   GLUCOSE 138 (H) 07/14/2014 0955   BUN 12 12/14/2016 1356   BUN 10 07/14/2014 0955   CREATININE 0.68 12/14/2016 1356   CREATININE 0.60 07/14/2014 0955   CALCIUM 9.0 12/14/2016 1356   CALCIUM 9.1 07/14/2014 0955   PROT 6.8 12/14/2016 1356   PROT 7.1 07/14/2014 0955   ALBUMIN 4.5 12/14/2016 1356   ALBUMIN 4.4 07/14/2014 0955   AST 21 12/14/2016 1356   AST 24 07/14/2014 0955   ALT 17 12/14/2016 1356   ALT 20 07/14/2014 0955   ALKPHOS 51 12/14/2016 1356   ALKPHOS 45 07/14/2014 0955   BILITOT 0.9 12/14/2016 1356   BILITOT 0.7 07/14/2014 0955   GFRNONAA >60 12/14/2016 1356   GFRNONAA >60 07/14/2014 0955   GFRAA >60 12/14/2016 1356   GFRAA >60 07/14/2014 0955    No results found for: SPEP, UPEP  Lab Results  Component Value Date   WBC 4.9 12/14/2016   NEUTROABS  3.0 12/14/2016   HGB 12.3 12/14/2016   HCT 34.7 (L) 12/14/2016   MCV 91.9 12/14/2016   PLT 212 12/14/2016      Chemistry      Component Value Date/Time   NA 137 12/14/2016 1356   NA 135 07/14/2014 0955   K 3.6 12/14/2016 1356   K 3.2 (L) 07/21/2014 1518   CL 104 12/14/2016 1356   CL 106 07/14/2014 0955   CO2 24 12/14/2016 1356   CO2 21 (L) 07/14/2014 0955   BUN 12 12/14/2016 1356   BUN 10 07/14/2014 0955   CREATININE  0.68 12/14/2016 1356   CREATININE 0.60 07/14/2014 0955      Component Value Date/Time   CALCIUM 9.0 12/14/2016 1356   CALCIUM 9.1 07/14/2014 0955   ALKPHOS 51 12/14/2016 1356   ALKPHOS 45 07/14/2014 0955   AST 21 12/14/2016 1356   AST 24 07/14/2014 0955   ALT 17 12/14/2016 1356   ALT 20 07/14/2014 0955   BILITOT 0.9 12/14/2016 1356   BILITOT 0.7 07/14/2014 0955       RADIOGRAPHIC STUDIES: I have personally reviewed the radiological images as listed and agreed with the findings in the report. No results found.   ASSESSMENT & PLAN:  Ovarian cancer, bilateral ([place]) # Stage II ovarian ca- status post chemotherapy followed by surgery. Clinically NED C125-negative. PET- June 2018- NED.   # left upper quad discomfort- unclear etiology ? Bacterial overgrowth/ status post doxycycline improved. Continue follow-up with GI. PET June 2018- NED.   # GOG study; discussed with RN- patient on control arm.  # port fush- every 2 months.  # Follow-up with me in 6 months/labs.    No orders of the defined types were placed in this encounter.  All questions were answered. The patient knows to call the clinic with any problems, questions or concerns.      Cammie Sickle, MD 12/30/2016 9:17 PM

## 2016-12-14 NOTE — Assessment & Plan Note (Signed)
#   Stage II ovarian ca- status post chemotherapy followed by surgery. Clinically NED C125-negative. PET- June 2018- NED.   # left upper quad discomfort- unclear etiology ? Bacterial overgrowth/ status post doxycycline improved. Continue follow-up with GI. PET June 2018- NED.   # GOG study; discussed with RN- patient on control arm.  # port fush- every 2 months.  # Follow-up with me in 6 months/labs.

## 2016-12-14 NOTE — Progress Notes (Signed)
Debbie Richardson returns to clinic today for her 21 month follow up visit with Dr. Rogue Bussing on the GOG 0225 research study. Patient reports pain in her left lower abdomen has improved since last visit. States she has not taken any further Doxycycline since the last round in June. Reports she has felt much better since her PET scan in June was negative for any signs of recurrent cancer. Patient denies any other new problems, but reports continued loss of smell. States she only receives calls from the study about every other month, and states she thinks they are trying to dismiss her. Informed that they will actually stop calling after her 24 month visit in December. Discussed that she will have to complete all of the questionnaires on her 24 month visit in December, and that she has an 8:30 am appointment because the study labs due require her to be fasting. VS are stable: B/P today is 115/82. Temp was 98.6 F. Waist measures 81.5cm (32 inches) today and her weight is 60.1kg (132 lbs - 9.6 oz); which are both up since her last visit. [name] states that when she learned that her PET scan was negative, she stopped being as careful with her diet and has been eating more this summer. Patient had a port-a-cath flush today as well as a CA-125 level, which has not yet resulted. Patient has return appt to see Dr. [name] in 3 months - on 03/15/17 at 9:00am following her fasting labs. Dr. Rogue Bussing has requested to see her back in 6 months with CBC, MetC and CA125. Current AE's with grade and attribution as follows:   Adverse Event Log  Study/Protocol: GOG 0225 Cycle: Month 19-21 visit Event Grade Onset Date Resolved Date Drug Name no drug Attribution Treatment Comments  Left pelvic pain worsening in past 4 weeks Grade 1 04/26/15 ongoing n/a Unlikely none Pt feels due to diverticulitis  Diverticulosis "GI other" Grade 1 06/24/15 Ongoing; improved n/a Unrelated none   Inflammation of joints Grade 1 07/06/15  approx.  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

## 2016-12-15 LAB — CA 125, SERUM (SERIAL): CANCER ANTIGEN (CA) 125: 9.8 U/mL (ref 0.0–38.1)

## 2016-12-19 NOTE — Progress Notes (Signed)
CA125 - 9.8U/mL

## 2016-12-22 DIAGNOSIS — H43813 Vitreous degeneration, bilateral: Secondary | ICD-10-CM | POA: Diagnosis not present

## 2017-01-27 ENCOUNTER — Telehealth: Payer: Self-pay | Admitting: Gastroenterology

## 2017-01-27 ENCOUNTER — Other Ambulatory Visit: Payer: Self-pay

## 2017-01-27 MED ORDER — DOXYCYCLINE HYCLATE 100 MG PO CAPS: 100.0000 mg | ORAL_CAPSULE | Freq: Two times a day (BID) | ORAL | 0 refills | Status: AC

## 2017-01-27 NOTE — Telephone Encounter (Signed)
Spoke to Dr. Vicente Males. Due to patient's symptoms he ordered doxycycline 100mg  BID x 14 days.   If symptoms do not resolve patient needs office visit for further diagnosis and treatment.   Advised patient via phone. Placed med order to [facility].

## 2017-01-27 NOTE — Telephone Encounter (Signed)
*  STAT* If patient is at the pharmacy, call can be transferred to refill team.   1. Which medications need to be refilled? (please list name of each medication and dose if known) Doxycycline   2. Which pharmacy/location (including street and city if local pharmacy) is medication to be sent to? [facility]  3. Do they need a 30 day or 90 day supply?

## 2017-02-01 ENCOUNTER — Inpatient Hospital Stay: Payer: BLUE CROSS/BLUE SHIELD | Attending: Internal Medicine

## 2017-02-01 DIAGNOSIS — Z9221 Personal history of antineoplastic chemotherapy: Secondary | ICD-10-CM | POA: Insufficient documentation

## 2017-02-01 DIAGNOSIS — Z452 Encounter for adjustment and management of vascular access device: Secondary | ICD-10-CM | POA: Diagnosis not present

## 2017-02-01 DIAGNOSIS — Z8543 Personal history of malignant neoplasm of ovary: Secondary | ICD-10-CM | POA: Insufficient documentation

## 2017-02-01 DIAGNOSIS — C569 Malignant neoplasm of unspecified ovary: Secondary | ICD-10-CM

## 2017-02-01 DIAGNOSIS — Z90710 Acquired absence of both cervix and uterus: Secondary | ICD-10-CM | POA: Diagnosis not present

## 2017-02-01 DIAGNOSIS — Z90722 Acquired absence of ovaries, bilateral: Secondary | ICD-10-CM | POA: Diagnosis not present

## 2017-02-01 DIAGNOSIS — Z006 Encounter for examination for normal comparison and control in clinical research program: Secondary | ICD-10-CM | POA: Diagnosis not present

## 2017-02-01 MED ORDER — SODIUM CHLORIDE 0.9% FLUSH
10.0000 mL | INTRAVENOUS | Status: DC | PRN
  Administered 2017-02-01: 10 mL via INTRAVENOUS
  Filled 2017-02-01: qty 10

## 2017-02-01 MED ORDER — HEPARIN SOD (PORK) LOCK FLUSH 100 UNIT/ML IV SOLN
500.0000 [IU] | Freq: Once | INTRAVENOUS | Status: AC
Start: 2017-02-01 — End: 2017-02-01
  Administered 2017-02-01: 500 [IU] via INTRAVENOUS

## 2017-02-10 ENCOUNTER — Telehealth: Payer: Self-pay | Admitting: Gastroenterology

## 2017-02-10 NOTE — Telephone Encounter (Signed)
Patient took the medication that Dr. Vicente Males gave her and still not feeling better. Please call her today.

## 2017-02-10 NOTE — Telephone Encounter (Signed)
Patient called again

## 2017-02-15 ENCOUNTER — Telehealth: Payer: Self-pay | Admitting: *Deleted

## 2017-02-15 NOTE — Telephone Encounter (Signed)
Dr. Jacinto Reap agree's to see patient. Please have patient come in at 8:30 on Friday. Thanks!

## 2017-02-15 NOTE — Telephone Encounter (Addendum)
Having symptoms of bloating lower abdominal back pain times 3 - 4 weeks and pressure against bladder times 2 days. She has been speaking with Dr Vicente Males who has been treating her with antibiotics which helps, but symptoms keep coming back, but worse this time. He is unable to see her until Monday. Symptoms are not bad enough for her to go to ER Asking if she can see Dr B or Dr [name]. Please advise

## 2017-02-15 NOTE — Telephone Encounter (Signed)
Patient agrees to appointment Friday at 8:30

## 2017-02-17 ENCOUNTER — Telehealth: Payer: Self-pay | Admitting: Internal Medicine

## 2017-02-17 ENCOUNTER — Inpatient Hospital Stay: Payer: BLUE CROSS/BLUE SHIELD | Attending: Internal Medicine | Admitting: Internal Medicine

## 2017-02-17 ENCOUNTER — Inpatient Hospital Stay: Payer: BLUE CROSS/BLUE SHIELD

## 2017-02-17 VITALS — BP 117/88 | HR 105 | Temp 97.4°F | Resp 16 | Wt 131.8 lb

## 2017-02-17 DIAGNOSIS — Z90710 Acquired absence of both cervix and uterus: Secondary | ICD-10-CM | POA: Insufficient documentation

## 2017-02-17 DIAGNOSIS — C561 Malignant neoplasm of right ovary: Secondary | ICD-10-CM

## 2017-02-17 DIAGNOSIS — C562 Malignant neoplasm of left ovary: Principal | ICD-10-CM

## 2017-02-17 DIAGNOSIS — R1012 Left upper quadrant pain: Secondary | ICD-10-CM | POA: Diagnosis not present

## 2017-02-17 DIAGNOSIS — A049 Bacterial intestinal infection, unspecified: Secondary | ICD-10-CM | POA: Diagnosis not present

## 2017-02-17 DIAGNOSIS — K5900 Constipation, unspecified: Secondary | ICD-10-CM | POA: Insufficient documentation

## 2017-02-17 DIAGNOSIS — Z9221 Personal history of antineoplastic chemotherapy: Secondary | ICD-10-CM | POA: Insufficient documentation

## 2017-02-17 DIAGNOSIS — Z006 Encounter for examination for normal comparison and control in clinical research program: Secondary | ICD-10-CM | POA: Insufficient documentation

## 2017-02-17 DIAGNOSIS — C563 Malignant neoplasm of bilateral ovaries: Secondary | ICD-10-CM

## 2017-02-17 DIAGNOSIS — K219 Gastro-esophageal reflux disease without esophagitis: Secondary | ICD-10-CM | POA: Diagnosis not present

## 2017-02-17 DIAGNOSIS — M549 Dorsalgia, unspecified: Secondary | ICD-10-CM | POA: Insufficient documentation

## 2017-02-17 DIAGNOSIS — Z8543 Personal history of malignant neoplasm of ovary: Secondary | ICD-10-CM | POA: Insufficient documentation

## 2017-02-17 DIAGNOSIS — Z87891 Personal history of nicotine dependence: Secondary | ICD-10-CM | POA: Insufficient documentation

## 2017-02-17 DIAGNOSIS — Z90722 Acquired absence of ovaries, bilateral: Secondary | ICD-10-CM | POA: Insufficient documentation

## 2017-02-17 DIAGNOSIS — R140 Abdominal distension (gaseous): Secondary | ICD-10-CM | POA: Diagnosis not present

## 2017-02-17 DIAGNOSIS — R1032 Left lower quadrant pain: Secondary | ICD-10-CM | POA: Insufficient documentation

## 2017-02-17 DIAGNOSIS — Z79899 Other long term (current) drug therapy: Secondary | ICD-10-CM | POA: Diagnosis not present

## 2017-02-17 DIAGNOSIS — M1990 Unspecified osteoarthritis, unspecified site: Secondary | ICD-10-CM | POA: Insufficient documentation

## 2017-02-17 DIAGNOSIS — Z803 Family history of malignant neoplasm of breast: Secondary | ICD-10-CM | POA: Insufficient documentation

## 2017-02-17 NOTE — Assessment & Plan Note (Addendum)
#   Stage II ovarian ca- status post chemotherapy followed by surgery. Clinically NED C125-negative. PET- June 2018- NED; ca-125 Normal; how ever- ?  Recurrence [see discussion below]  # bloating/back pain/worsening left upper quadrant abdominal discomfort-not improved on doxycycline; awaiting GI evaluation next week.  I would recommend a CT of the abdomen pelvis with contrast ASAP.  And also check a CA 125 today.  Encouraged the patient to keep appointment with GI next week  # GOG study; discussed with RN- patient on control arm.  # Genetic testing- ? Will check re: previous work up.   # port fush- every 2 months.  # keep previous appt for now;  Ca- 125 today; CT scan asap-we will call with results.

## 2017-02-17 NOTE — Progress Notes (Signed)
[facility] OFFICE PROGRESS NOTE  Patient Care Team: Tower, [name], MD as PCP - General [name], RN as Registered Nurse  Cancer Staging No matching staging information was found for the patient.    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  Finished total 6 cycles of chemotherapy with carboplatinum and Taxotere June of 2016  # Patient underwent exploratory laparotomy bilateral oophorectomy and omentectomy in July of 2016 NO evidence of malignancy was found.   CT SEP 6th 2017- NED  # GOG 0225 [diet/excerise- on control arm]  # chronic abdominal discomfort- ? Bacterial overgrowth syndrome [dr.Anna]   # Severe reaction to Taxol; also post de-sensitization [hives]  ?? Genetic testing     Ovarian cancer, bilateral ([place])      INTERVAL HISTORY:  Debbie Richardson 56 y.o.  female pleasant patient above history of Stage II ovarian cancer currently on surveillance is here for worsening left lower quadrant abdominal pain.  Patient has been diagnosed with bacterial overgrowth syndrome with improvement on doxycycline in the past.  However she noted to have worsening left lower quadrant abdominal pain; bloating; "feeling gassy"; mild abdominal distention.  Mild worsening constipation rather than diarrhea.  Her appetite is good.  No nausea no vomiting.   Patient symptoms have not resolved after use of doxycycline this time.  She is awaiting a GI consultation next week.  REVIEW OF SYSTEMS:  A complete 10 point review of system is done which is negative except mentioned above/history of present illness.   PAST MEDICAL HISTORY :  Past Medical History:  Diagnosis Date  . Anxiety   . Arthritis   . Benign fundic gland polyps of stomach   . Dizziness    vertigo  . Gallstones   . GERD (gastroesophageal reflux disease)   . Ovarian cancer ([place]) 2016    PAST SURGICAL  HISTORY :   Past Surgical History:  Procedure Laterality Date  . CESAREAN SECTION    . CHOLECYSTECTOMY    . COLONOSCOPY    . LAPAROSCOPIC HYSTERECTOMY N/A 10/29/2014   Procedure: HYSTERECTOMY TOTAL LAPAROSCOPIC, OMENTUMECTOMY;  Surgeon: Gillis Ends, MD;  Location: [facility] ORS;  Service: Gynecology;  Laterality: N/A;  . LAPAROSCOPIC OOPHERECTOMY    . OOPHORECTOMY    . UPPER GI ENDOSCOPY      FAMILY HISTORY :   Family History  Problem Relation Age of Onset  . Hypertension Mother   . Hyperlipidemia Mother   . GER disease Mother   . Heart disease Father   . Heart disease Paternal Grandmother   . Breast cancer Paternal Grandmother 58  . Kidney disease Neg Hx   . Liver disease Neg Hx   . Colon cancer Neg Hx   . Colon polyps Neg Hx   . Esophageal cancer Neg Hx   . Pancreatic cancer Neg Hx     SOCIAL HISTORY:   Social History   Tobacco Use  . Smoking status: Former Smoker    Last attempt to quit: 04/05/1991    Years since quitting: 25.8  . Smokeless tobacco: Never Used  Substance Use Topics  . Alcohol use: Yes    Alcohol/week: 0.0 oz    Comment: 1 drink of wine daily  . Drug use: No    ALLERGIES:  is allergic to ciprofloxacin; paclitaxel; and flagyl [metronidazole].  MEDICATIONS:  Current Outpatient Medications  Medication Sig Dispense Refill  . acetaminophen (  TYLENOL) 500 MG tablet Take 2 tablets (1,000 mg total) by mouth every 6 (six) hours. (Patient taking differently: Take 1,000 mg by mouth every 6 (six) hours as needed for mild pain, moderate pain, fever or headache. ) 30 tablet 0  . omeprazole (PRILOSEC) 40 MG capsule Take 1 capsule (40 mg total) by mouth daily. 30 capsule 2   No current facility-administered medications for this visit.    Facility-Administered Medications Ordered in Other Visits  Medication Dose Route Frequency Provider Last Rate Last Dose  . sodium chloride 0.9 % injection 10 mL  10 mL Intravenous PRN Forest Gleason, MD   10 mL at 11/19/14  1628  . sodium chloride flush (NS) 0.9 % injection 10 mL  10 mL Intravenous PRN Cammie Sickle, MD   10 mL at 10/28/15 1135  . sodium chloride flush (NS) 0.9 % injection 10 mL  10 mL Intravenous PRN Cammie Sickle, MD   10 mL at 10/28/16 1604    PHYSICAL EXAMINATION: ECOG PERFORMANCE STATUS: 0 - Asymptomatic  BP 117/88 (BP Location: Left Arm, Patient Position: Sitting)   Pulse (!) 105   Temp (!) 97.4 F (36.3 C)   Resp 16   Wt 131 lb 12.8 oz (59.8 kg)   LMP 03/30/2013   BMI 23.35 kg/m   Filed Weights   02/17/17 0833  Weight: 131 lb 12.8 oz (59.8 kg)    GENERAL: Well-nourished well-developed; Alert, no distress and comfortable.   alone EYES: no pallor or icterus OROPHARYNX: no thrush or ulceration; good dentition  NECK: supple, no masses felt LYMPH:  no palpable lymphadenopathy in the cervical, axillary or inguinal regions LUNGS: clear to auscultation and  No wheeze or crackles HEART/CVS: regular rate & rhythm and no murmurs; No lower extremity edema ABDOMEN:abdomen soft, non-tender and normal bowel sounds Musculoskeletal:no cyanosis of digits and no clubbing  PSYCH: alert & oriented x 3 with fluent speech NEURO: no focal motor/sensory deficits SKIN:  no rashes or significant lesions  LABORATORY DATA:  I have reviewed the data as listed    Component Value Date/Time   NA 137 12/14/2016 1356   NA 135 07/14/2014 0955   K 3.6 12/14/2016 1356   K 3.2 (L) 07/21/2014 1518   CL 104 12/14/2016 1356   CL 106 07/14/2014 0955   CO2 24 12/14/2016 1356   CO2 21 (L) 07/14/2014 0955   GLUCOSE 96 12/14/2016 1356   GLUCOSE 138 (H) 07/14/2014 0955   BUN 12 12/14/2016 1356   BUN 10 07/14/2014 0955   CREATININE 0.68 12/14/2016 1356   CREATININE 0.60 07/14/2014 0955   CALCIUM 9.0 12/14/2016 1356   CALCIUM 9.1 07/14/2014 0955   PROT 6.8 12/14/2016 1356   PROT 7.1 07/14/2014 0955   ALBUMIN 4.5 12/14/2016 1356   ALBUMIN 4.4 07/14/2014 0955   AST 21 12/14/2016 1356    AST 24 07/14/2014 0955   ALT 17 12/14/2016 1356   ALT 20 07/14/2014 0955   ALKPHOS 51 12/14/2016 1356   ALKPHOS 45 07/14/2014 0955   BILITOT 0.9 12/14/2016 1356   BILITOT 0.7 07/14/2014 0955   GFRNONAA >60 12/14/2016 1356   GFRNONAA >60 07/14/2014 0955   GFRAA >60 12/14/2016 1356   GFRAA >60 07/14/2014 0955    No results found for: SPEP, UPEP  Lab Results  Component Value Date   WBC 4.9 12/14/2016   NEUTROABS 3.0 12/14/2016   HGB 12.3 12/14/2016   HCT 34.7 (L) 12/14/2016   MCV 91.9 12/14/2016   PLT  212 12/14/2016      Chemistry      Component Value Date/Time   NA 137 12/14/2016 1356   NA 135 07/14/2014 0955   K 3.6 12/14/2016 1356   K 3.2 (L) 07/21/2014 1518   CL 104 12/14/2016 1356   CL 106 07/14/2014 0955   CO2 24 12/14/2016 1356   CO2 21 (L) 07/14/2014 0955   BUN 12 12/14/2016 1356   BUN 10 07/14/2014 0955   CREATININE 0.68 12/14/2016 1356   CREATININE 0.60 07/14/2014 0955      Component Value Date/Time   CALCIUM 9.0 12/14/2016 1356   CALCIUM 9.1 07/14/2014 0955   ALKPHOS 51 12/14/2016 1356   ALKPHOS 45 07/14/2014 0955   AST 21 12/14/2016 1356   AST 24 07/14/2014 0955   ALT 17 12/14/2016 1356   ALT 20 07/14/2014 0955   BILITOT 0.9 12/14/2016 1356   BILITOT 0.7 07/14/2014 0955       RADIOGRAPHIC STUDIES: I have personally reviewed the radiological images as listed and agreed with the findings in the report. No results found.   ASSESSMENT & PLAN:  Ovarian cancer, bilateral ([place]) # Stage II ovarian ca- status post chemotherapy followed by surgery. Clinically NED C125-negative. PET- June 2018- NED; ca-125 Normal; how ever- ?  Recurrence [see discussion below]  # bloating/back pain/worsening left upper quadrant abdominal discomfort-not improved on doxycycline; awaiting GI evaluation next week.  I would recommend a CT of the abdomen pelvis with contrast ASAP.  And also check a CA 125 today.  Encouraged the patient to keep appointment with GI next week  #  GOG study; discussed with RN- patient on control arm.  # Genetic testing- ? Will check re: previous work up.   # port fush- every 2 months.  # keep previous appt for now;  Ca- 125 today; CT scan asap-we will call with results.   Orders Placed This Encounter  Procedures  . CT ABDOMEN PELVIS W CONTRAST    Standing Status:   Future    Standing Expiration Date:   02/17/2018    Order Specific Question:   If indicated for the ordered procedure, I authorize the administration of contrast media per Radiology protocol    Answer:   Yes    Order Specific Question:   Preferred imaging location?    Answer:   [facility]    Order Specific Question:   Radiology Contrast Protocol - do NOT remove file path    Answer:   [email or web address]    Order Specific Question:   Is patient pregnant?    Answer:   No  . CA 125    Standing Status:   Future    Standing Expiration Date:   02/17/2018   All questions were answered. The patient knows to call the clinic with any problems, questions or concerns.      Cammie Sickle, MD 02/17/2017 8:57 AM

## 2017-02-17 NOTE — Telephone Encounter (Signed)
H/B- Please check if pt had genetic testing for BRCA in past.  Thx

## 2017-02-18 LAB — CA 125: Cancer Antigen (CA) 125: 12.1 U/mL (ref 0.0–38.1)

## 2017-02-20 ENCOUNTER — Ambulatory Visit: Payer: BLUE CROSS/BLUE SHIELD | Admitting: Gastroenterology

## 2017-02-20 ENCOUNTER — Encounter: Payer: Self-pay | Admitting: Gastroenterology

## 2017-02-20 VITALS — BP 117/82 | HR 97 | Temp 97.8°F | Ht 63.0 in | Wt 131.0 lb

## 2017-02-20 DIAGNOSIS — R140 Abdominal distension (gaseous): Secondary | ICD-10-CM

## 2017-02-20 DIAGNOSIS — K6389 Other specified diseases of intestine: Secondary | ICD-10-CM

## 2017-02-20 DIAGNOSIS — H43813 Vitreous degeneration, bilateral: Secondary | ICD-10-CM | POA: Diagnosis not present

## 2017-02-20 NOTE — Progress Notes (Signed)
Debbie Bellows MD, MRCP(U.K) [address]  [place]  [place], [place] [address]  Main: [phone]  Fax: [phone]   Primary Care Physician: Tower, Debbie Fanny, MD  Primary Gastroenterologist:  Dr. Jonathon Richardson    Summary of history : She is here for follow up. 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 Undergone BSO in 2016 . CT abdomen 12/2015- no evidence of metastatic disease.  Labs 12/2015- Hb 13.3,LFT-normal .colonoscopy in 04/2014 showed diverticulosis and internal hemorroids. EGD 04/2014 showed - small gastric polyps(fundic gland polyps) otherwise normal.I treated her empirically with doxycycline for 2 weeks , I felt that the adhesions from surgery were likely contributing to stasis and small bowel bacterial overgrowth.    Interval history   2//2018-  02/20/2017   Between last visit and now she had 2 episodes of doxycycline and responded well . She says that the symptoms recurred in mid October- similar to the past - pain , throbbing in nature, no bloating-she called our office, started doxycycline-felt better- all of a sudden felt it stopped working . Started to watch what she was eating . Towards the end of doxycycline noticed some bloating, no pain but has pressure. Tylenol made the bloating worse. She says that her bowel have been more constipated than usual. She also feels that the she is not having an adequate bowel movement. No new pain medications. Denies consuming any diet tea, soda. Started some dairy .No weight loss. Saw Dr Rogue Bussing on Friday   . Scheduled for a CT scan on 02/27/17  . CBC and CMP was normal in 12/2016.    Chief Complaint  Patient presents with  . Abdominal Pain  . Bloated  . Back Pain    HPI: Debbie Richardson is a 56 y.o. female  Current Outpatient Medications  Medication Sig Dispense Refill  .  acetaminophen (TYLENOL) 500 MG tablet Take 2 tablets (1,000 mg total) by mouth every 6 (six) hours. (Patient taking differently: Take 1,000 mg by mouth every 6 (six) hours as needed for mild pain, moderate pain, fever or headache. ) 30 tablet 0  . omeprazole (PRILOSEC) 40 MG capsule Take 1 capsule (40 mg total) by mouth daily. (Patient not taking: Reported on 02/20/2017) 30 capsule 2   No current facility-administered medications for this visit.    Facility-Administered Medications Ordered in Other Visits  Medication Dose Route Frequency Provider Last Rate Last Dose  . sodium chloride 0.9 % injection 10 mL  10 mL Intravenous PRN Forest Gleason, MD   10 mL at 11/19/14 1628  . sodium chloride flush (NS) 0.9 % injection 10 mL  10 mL Intravenous PRN Cammie Sickle, MD   10 mL at 10/28/15 1135  . sodium chloride flush (NS) 0.9 % injection 10 mL  10 mL Intravenous PRN Cammie Sickle, MD   10 mL at 10/28/16 1604    Allergies as of 02/20/2017 - Review Complete 02/20/2017  Allergen Reaction Noted  . Ciprofloxacin Anaphylaxis 08/19/2013  . Paclitaxel Shortness Of Breath and Other (See Comments) 06/27/2014  . Flagyl [metronidazole] Other (See Comments) 06/22/2015    ROS:  General: Negative for anorexia, weight loss, fever, chills, fatigue, weakness. ENT: Negative for hoarseness, difficulty swallowing , nasal congestion. CV: Negative for chest pain, angina, palpitations, dyspnea on exertion, peripheral edema.  Respiratory: Negative for dyspnea at rest, dyspnea  on exertion, cough, sputum, wheezing.  GI: See history of present illness. GU:  Negative for dysuria, hematuria, urinary incontinence, urinary frequency, nocturnal urination.  Endo: Negative for unusual weight change.    Physical Examination:   BP 117/82 (BP Location: Left Arm, Patient Position: Sitting, Cuff Size: Normal)   Pulse 97   Temp 97.8 F (36.6 C) (Oral)   Ht 5\' 3"  (1.6 m)   Wt 131 lb (59.4 kg)   LMP 03/30/2013    BMI 23.21 kg/m   General: Well-nourished, well-developed in no acute distress.  Eyes: No icterus. Conjunctivae pink. Mouth: Oropharyngeal mucosa moist and pink , no lesions erythema or exudate. Lungs: Clear to auscultation bilaterally. Non-labored. Heart: Regular rate and rhythm, no murmurs rubs or gallops.  Abdomen: Bowel sounds are normal, nontender, nondistended, no hepatosplenomegaly or masses, no abdominal bruits or hernia , no rebound or guarding.   Extremities: No lower extremity edema. No clubbing or deformities. Neuro: Alert and oriented x 3.  Grossly intact. Skin: Warm and dry, no jaundice.   Psych: Alert and cooperative, normal mood and affect.   Imaging Studies: No results found.  Assessment and Plan:   Debbie Richardson is a 56 y.o. 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 It is likely adhesions from surgery has contributed to bacterial overgrowth. At times one may stop responding to a certain antibiotic. I will try changing the antibibiotic to Xifaxan which at times works better. I will give her a sample course. If no better at next office visit and based on CT scan results that have been ordered, further testing can be decided such as endoscopy. Continue FODMAP diet in the meanwhile as well    Dr Debbie Bellows  MD,MRCP [facility]) Follow up in 3-6 weeks

## 2017-02-24 ENCOUNTER — Ambulatory Visit: Payer: BLUE CROSS/BLUE SHIELD

## 2017-02-27 ENCOUNTER — Telehealth: Payer: Self-pay | Admitting: *Deleted

## 2017-02-27 ENCOUNTER — Ambulatory Visit
Admission: RE | Admit: 2017-02-27 | Discharge: 2017-02-27 | Disposition: A | Discharge status: Home / Self Care | Payer: BLUE CROSS/BLUE SHIELD | Source: Ambulatory Visit | Attending: Internal Medicine | Admitting: Internal Medicine

## 2017-02-27 ENCOUNTER — Telehealth: Payer: Self-pay | Admitting: Internal Medicine

## 2017-02-27 DIAGNOSIS — R1032 Left lower quadrant pain: Secondary | ICD-10-CM | POA: Insufficient documentation

## 2017-02-27 DIAGNOSIS — K5641 Fecal impaction: Secondary | ICD-10-CM | POA: Insufficient documentation

## 2017-02-27 DIAGNOSIS — K5730 Diverticulosis of large intestine without perforation or abscess without bleeding: Secondary | ICD-10-CM | POA: Insufficient documentation

## 2017-02-27 MED ORDER — IOPAMIDOL (ISOVUE-300) INJECTION 61%
100.0000 mL | Freq: Once | INTRAVENOUS | Status: AC | PRN
  Administered 2017-02-27: 100 mL via INTRAVENOUS

## 2017-02-27 NOTE — Telephone Encounter (Signed)
Asking about CT results from today

## 2017-02-27 NOTE — Telephone Encounter (Signed)
I reviewed the CT findings with pt- No acute finding noted on CT scan. No CT finding to explain pt's on going symptoms of bloating. Will also discuss with Dr.Anna.

## 2017-02-28 ENCOUNTER — Telehealth: Payer: Self-pay | Admitting: Internal Medicine

## 2017-02-28 ENCOUNTER — Encounter: Payer: Self-pay | Admitting: *Deleted

## 2017-02-28 NOTE — Telephone Encounter (Signed)
Please inform patient that-Dr. Vicente Males recommends current new antibiotic course; and then reevaluate.  I would defer to Dr. Vicente Males for his expertise at this time.

## 2017-02-28 NOTE — Telephone Encounter (Signed)
Left vm for patient to return my phone call. 

## 2017-02-28 NOTE — Telephone Encounter (Signed)
Noted message below. I have given her a course of Xifaxan to treat possible bacterial overgrowth. IF no better at the end of the course will likely need EGD+/- breath testing to confirm she doesn't have persistent bacterial overgrowth syndrome . Will keep you updated  Regards  Delsa Walder   Dr [name] MD,MRCP [facility]) Gastroenterology/Hepatology Pager: [phone]

## 2017-02-28 NOTE — Telephone Encounter (Signed)
Also sent my chart msg to patient.

## 2017-03-06 ENCOUNTER — Telehealth: Payer: Self-pay | Admitting: *Deleted

## 2017-03-06 NOTE — Telephone Encounter (Signed)
Received t/c from [facility] earlier this afternoon expressing concern that she was called and informed that her appointment scheduled on 03/15/17 is being cancelled. States she told them they could not cancel her appointment because she is a research patient and this is her final visit. Patient also reports she was in to see Dr. Rogue Bussing on 02/17/17 and has been experiencing some GI problems. She has also seen her GI doc and had a CT of her abdomen and pelvis, which was negative for recurrent ovarian cancer, but did show some diverticulosis. Informed Ms. Goguen that we do need for her to keep this appointment for now and I will contact the study to determine what our visit window for this time point is. Reminded her that she has fasting labs first thing that morning and that we will be completing her 24 month questionnaires for the study. Patient states she has an appointment with her GI doc at 10:00 that morning, but she will call and see if she can get that appointment moved to later in the morning. Informed that I will call her back as soon as I hear from the study. Also spoke with Mariea Clonts and informed that I have contacted the study to see what we need to do about rescheduling the visit. [name], BSN, MHA, OCN 03/06/2017 4:37 PM

## 2017-03-08 ENCOUNTER — Telehealth: Payer: Self-pay | Admitting: *Deleted

## 2017-03-08 NOTE — Telephone Encounter (Signed)
Received verbal message today from Thereasa Parkin, BS on the [place] teleconference that ideally the patient should have their 24 month visit completed within 30 days of the target visit date. I have notified Festus Holts in scheduling and Mariea Clonts, GYN Navigator of this new scheduling information. T/C made to [facility] as well and informed that she will need to come to clinic as scheduled on 03/15/17 at 8:30am to have her study labs collected and we will also complete her 24 month battery of questionnaires at that time. We will use her last visit with Dr. Rogue Bussing on 02/17/17 as her study MD assessment and this will free her to reschedule with Dr. [name] at her convenience. Patient verified that she will come fasting on 03/15/17 for study labs and will complete her questionnaires in clinic. She also has a GI appointment at 10:00 that morning. [name], BSN, MHA, OCN 03/08/2017 2:32 PM

## 2017-03-15 ENCOUNTER — Encounter: Payer: Self-pay | Admitting: Obstetrics and Gynecology

## 2017-03-15 ENCOUNTER — Encounter: Payer: Self-pay | Admitting: *Deleted

## 2017-03-15 ENCOUNTER — Ambulatory Visit: Payer: BLUE CROSS/BLUE SHIELD | Admitting: Gastroenterology

## 2017-03-15 ENCOUNTER — Inpatient Hospital Stay: Payer: BLUE CROSS/BLUE SHIELD | Attending: Obstetrics and Gynecology | Admitting: Obstetrics and Gynecology

## 2017-03-15 ENCOUNTER — Ambulatory Visit: Payer: BLUE CROSS/BLUE SHIELD

## 2017-03-15 ENCOUNTER — Inpatient Hospital Stay: Payer: BLUE CROSS/BLUE SHIELD

## 2017-03-15 ENCOUNTER — Encounter: Payer: Self-pay | Admitting: Gastroenterology

## 2017-03-15 VITALS — BP 114/78 | HR 81 | Temp 98.3°F | Ht 63.0 in | Wt 131.6 lb

## 2017-03-15 VITALS — BP 123/86 | HR 84 | Temp 98.2°F | Resp 20 | Ht 62.5 in | Wt 132.1 lb

## 2017-03-15 DIAGNOSIS — Z90710 Acquired absence of both cervix and uterus: Secondary | ICD-10-CM | POA: Insufficient documentation

## 2017-03-15 DIAGNOSIS — L409 Psoriasis, unspecified: Secondary | ICD-10-CM | POA: Diagnosis not present

## 2017-03-15 DIAGNOSIS — C569 Malignant neoplasm of unspecified ovary: Secondary | ICD-10-CM

## 2017-03-15 DIAGNOSIS — Z006 Encounter for examination for normal comparison and control in clinical research program: Secondary | ICD-10-CM | POA: Diagnosis not present

## 2017-03-15 DIAGNOSIS — K6389 Other specified diseases of intestine: Secondary | ICD-10-CM

## 2017-03-15 DIAGNOSIS — Z90722 Acquired absence of ovaries, bilateral: Secondary | ICD-10-CM

## 2017-03-15 DIAGNOSIS — Z87891 Personal history of nicotine dependence: Secondary | ICD-10-CM | POA: Insufficient documentation

## 2017-03-15 DIAGNOSIS — R140 Abdominal distension (gaseous): Secondary | ICD-10-CM

## 2017-03-15 DIAGNOSIS — Z9221 Personal history of antineoplastic chemotherapy: Secondary | ICD-10-CM | POA: Diagnosis not present

## 2017-03-15 DIAGNOSIS — Z79899 Other long term (current) drug therapy: Secondary | ICD-10-CM | POA: Diagnosis not present

## 2017-03-15 DIAGNOSIS — K5730 Diverticulosis of large intestine without perforation or abscess without bleeding: Secondary | ICD-10-CM | POA: Diagnosis not present

## 2017-03-15 DIAGNOSIS — Z8543 Personal history of malignant neoplasm of ovary: Secondary | ICD-10-CM | POA: Insufficient documentation

## 2017-03-15 DIAGNOSIS — K219 Gastro-esophageal reflux disease without esophagitis: Secondary | ICD-10-CM

## 2017-03-15 DIAGNOSIS — Z780 Asymptomatic menopausal state: Secondary | ICD-10-CM

## 2017-03-15 NOTE — Progress Notes (Signed)
Debbie Richardson returns to clinic today for her 24 month follow up visit with Dr. [name] on the GOG 0225 research study. Patient saw Dr. Rogue Bussing on 02/17/17 due to increased pain in left lower abdomen. She had a CT scan on 02/27/17 revealing no acute findings and has been following up with her GI doc for a flare of diverticulosis. Debbie Richardson reports she has taken another 2 week round of Doxycycline last month, but this did not really improve her symptoms. States she must be very careful with her diet and has noticed that in spite of the fact that she is not eating more and not gaining weight, her waist measurement has increased. Patient denies any other new problems, but reports continued loss of smell. States she is scheduled to receive her last study phone call this afternoon and she will have completed that portion of the study. She was scheduled for Central Study labs this morning, but forgot and did not fast. Labs have been rescheduled for 03/20/17. VS are stable: B/P today is 111/80. Temp is 98.47F and HR is 92. Waist measures 81.3cm (32 inches) today; which is the same that it was 3 months ago, and her weight is 59.14kg (130 lbs - 6 oz). Debbie Richardson completed the AFFQ, APAQ, RAND-36 and GSRS-IBS questionnaires while in clinic this morning. Patient had a CA-125 level drawn when she saw Dr. Rogue Bussing 3 weeks ago and the result was normal - 12.1 U/ml. Debbie Richardson will return to clinic in 3 months to see Dr. Rogue Bussing off study and continue to see Dr. [name] as well, per standard of care. She is also following up with Dr. [name] for her GI issues and will see her later this morning. Current AE's with grade and attribution as follows:  Adverse Event Log  Study/Protocol: GOG 0225 Cycle: Month 22-24 visit Event Grade Onset Date Resolved Date Drug Name no drug Attribution Treatment Comments  Left pelvic pain worsening in past 4 weeks Grade 1 04/26/15 ongoing n/a Unlikely none Pt feels due to  diverticulitis  Diverticulosis "GI other" Grade 1 06/24/15 Ongoing; improved n/a Unrelated none   Inflammation of joints Grade 1 07/06/15 approx.  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  Gillis Ends, MD

## 2017-03-15 NOTE — Progress Notes (Addendum)
Gynecologic Oncology Interval Note  Referring Provider: Dr. Forest Gleason, Dr. 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 She is on GOG225.   She saw Dr. Rogue Bussing on 02/17/2017 and had a negative exam but complained of persistent left upper quad discomfort- unclear etiology ? She saw Dr. 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 Dr. Artis Delay It was likely adhesions from surgery had contributed to bacterial overgrowth. She previously was treated with doxorubicin, but Dr. Bailey Mech changed her antibibiotic to Xifaxan. If there is no improvement Dr. Bailey Mech will consier further testing such as endoscopy+/- breath testing. She did take 10 days of antibiotic therapy and her GI symptoms improved (BM normal and bloating better) but she developed thrush.    CT scan of abdomen and pelvis 02/27/2017  IMPRESSION: 1. No acute findings are noted in the abdomen or pelvis to account for the patient's symptoms. Notably, there is a low stool burden despite the patient's reported history of constipation. 2. Colonic diverticulosis without evidence of acute diverticulitis at this time. 3. Additional incidental findings, as above.  Cancer Antigen (CA) 125 0.0 - 38.1 U/mL 12.1     At her last visit she had menopausal symptoms and we discussed Effexor therapy, but she did not pursue. She has used magnesium and felt that improved her symptoms.   She is on the GOG225 study-control arm and her last visit is today.     Oncology Treatment History:  Debbie Richardson is a 56 year old who has at least stage II high-grade serous ovarian cancer. See prior notes for complete details. She was taken  emergently to the operating room on 04/28/2014 by Dr. Glennon Mac and a laparoscopic procedure was performed.  Bilateral salpingo-oophorectomy with  placement of the cystic lesions in the Endo Catch bag and removal through the LLQ port. Per the operative note the right ovary did rupture and contents of the cysts were spilled into the pelvis.  The final pathology revealed high-grade serous adenocarcinoma involving both ovaries and fallopian tubes. Washings also revealed clusters of highly atypical glandular cells and surface involvement of the ovary was present. Staging was felt to be stage II at least high grade serous ovarian cancer. Preoperative CA125 was 45.   She received 6 cycles of carboplatin/taxane chemotherapy via an IV port, last treatment June 2016.  She had an allergy to taxol and was switched to taxotere for the last 4 cycles. She underwent interval TLH/omentectomy on 10/29/2014 for ovarian cancer. Her final pathology was negative. She enrolled on GOG225.   CT scan 01/09/2015 negative  CA125 results postchemotherapy  02/11/2015 10.4 03/12/2015 10.7 06/10/2015 10.0 03/15/2016  10.1 06/09/2016 9.9 09/14/2016 10.9   CT scan 06/24/2015 IMPRESSION: No evidence of urolithiasis, hydronephrosis, or other acute Findings. Colonic diverticulosis. No radiographic evidence of diverticulitis.  CT scan of abdomen and pelvis 12/09/2015 IMPRESSION: Status post hysterectomy and bilateral salpingo-oophorectomy. No evidence of recurrent or metastatic disease  PET scan 09/19/2016 IMPRESSION: Negative PET-CT.  No findings for hypermetabolic recurrent tumor.  Genetic testing was negative.   Screening mammogram on5/07/2016 negative ordered by Dr. Rogue Bussing   Problem List: Patient Active Problem List   Diagnosis Date Noted  . Menopause 09/14/2016  . Malignant neoplasm of ovary ([place]) 12/09/2015  . Sinus  pressure 08/17/2015  . Acute sinusitis 08/11/2015  . Left flank pain 06/22/2015  . Abdominal  tenderness of left lower quadrant 06/22/2015  . Lower abdominal pain 11/26/2014  . Status post laparoscopic hysterectomy 10/29/2014  . Allergic conjunctivitis and rhinitis 07/04/2012  . GERD (gastroesophageal reflux disease) 05/01/2007  . PSORIASIS NEC 12/07/2006    Past Medical History: Past Medical History:  Diagnosis Date  . Anxiety   . Arthritis   . Benign fundic gland polyps of stomach   . Dizziness    vertigo  . Gallstones   . GERD (gastroesophageal reflux disease)   . Ovarian cancer ([place]) 2016    Past Surgical History: Past Surgical History:  Procedure Laterality Date  . CESAREAN SECTION    . CHOLECYSTECTOMY    . COLONOSCOPY    . LAPAROSCOPIC HYSTERECTOMY N/A 10/29/2014   Procedure: HYSTERECTOMY TOTAL LAPAROSCOPIC, OMENTUMECTOMY;  Surgeon: Gillis Ends, MD;  Location: [facility] ORS;  Service: Gynecology;  Laterality: N/A;  . LAPAROSCOPIC OOPHERECTOMY    . OOPHORECTOMY    . UPPER GI ENDOSCOPY      Family History: Family History  Problem Relation Age of Onset  . Hypertension Mother   . Hyperlipidemia Mother   . GER disease Mother   . Heart disease Father   . Heart disease Paternal Grandmother   . Breast cancer Paternal Grandmother 22  . Kidney disease Neg Hx   . Liver disease Neg Hx   . Colon cancer Neg Hx   . Colon polyps Neg Hx   . Esophageal cancer Neg Hx   . Pancreatic cancer Neg Hx     Social History: Social History   Socioeconomic History  . Marital status: Married    Spouse name: Not on file  . Number of children: 1  . Years of education: Not on file  . Highest education level: Not on file  Social Needs  . Financial resource strain: Not on file  . Food insecurity - worry: Not on file  . Food insecurity - inability: Not on file  . Transportation needs - medical: Not on file  . Transportation needs - non-medical: Not on file  Occupational History  . Not on file  Tobacco Use  . Smoking status: Former Smoker    Last attempt to quit:  04/05/1991    Years since quitting: 25.9  . Smokeless tobacco: Never Used  Substance and Sexual Activity  . Alcohol use: Yes    Alcohol/week: 0.0 oz    Comment: 1 drink of wine daily  . Drug use: No  . Sexual activity: Yes  Other Topics Concern  . Not on file  Social History Narrative  . Not on file    Allergies: Allergies  Allergen Reactions  . Ciprofloxacin Anaphylaxis  . Paclitaxel Shortness Of Breath and Other (See Comments)    Other reaction(s): Tight chest (finding) Chest and facial flushing Flushing, chest tightness, SOB w/ Taxol on 05/26/14 at [place].  . Flagyl [Metronidazole] Other (See Comments)    headache    Current Medications: Current Outpatient Medications  Medication Sig Dispense Refill  . acetaminophen (TYLENOL) 500 MG tablet Take 2 tablets (1,000 mg total) by mouth every 6 (six) hours. (Patient taking differently: Take 1,000 mg by mouth every 6 (six) hours as needed for mild pain, moderate pain, fever or headache. ) 30 tablet 0  . omeprazole (PRILOSEC) 40 MG capsule Take 1 capsule (40 mg total) by mouth daily. (Patient not taking: Reported on 02/20/2017) 30 capsule 2  No current facility-administered medications for this visit.    Facility-Administered Medications Ordered in Other Visits  Medication Dose Route Frequency Provider Last Rate Last Dose  . sodium chloride 0.9 % injection 10 mL  10 mL Intravenous PRN Forest Gleason, MD   10 mL at 11/19/14 1628  . sodium chloride flush (NS) 0.9 % injection 10 mL  10 mL Intravenous PRN Cammie Sickle, MD   10 mL at 10/28/15 1135  . sodium chloride flush (NS) 0.9 % injection 10 mL  10 mL Intravenous PRN Cammie Sickle, MD   10 mL at 10/28/16 1604    Genetic Testing: negative   Review of Systems General: negative  HEENT: negative  Lungs: no SOB/cough  Cardiac: no complaints  GI: as per interval history  GU: h/o bladder issues; negative today  Musculoskeletal: back pain, chronic   Extremities: no complaints  Skin: no complaints  Neuro: peripheral neuropathy  Endocrine: no complaints  Psych: no complaints  Allergy complaints - seasonal       Objective:   Vitals:   03/15/17 0925  BP: 123/86  Pulse: 84  Resp: 20  Temp: 98.2 F (36.8 C)    Body mass index is 23.78 kg/m.  ECOG Performance Status: 0 - Asymptomatic  General appearance: alert, cooperative and appears stated age HEENT:PERRLA, extra ocular movement intact, sclera clear, anicteric CV: RRR Lungs: B CTA Lymph node survey: Negative for axillary, supraclavicular or left inguinal adenopathy. One palpate node right inguinal - 1.5 cm nontender and mobile Abdomen: soft, nontender and nondistended. No hernias, masses, infection or ascites and incisions well healed.  Back: negative for CVAT Extremities: extremities normal, atraumatic, no cyanosis or edema Neurological exam reveals alert, oriented, normal speech, no focal findings or movement disorder noted.  Pelvic: exam chaperoned by nurse;  Vulva: normal appearing vulva with no masses, tenderness or lesions; Vagina: normal vagina and cuff healed; BME: nontender and no masses; UterusCervix: surgically absent. Rectal: negative for masses or nodularity    Assessment:  Debbie Richardson is a 56 y.o. female with a history of stage II at least high grade serous ovarian cancer. Status post LS BSO and 6 cycles of platin/taxane chemotherapy.  No evidence of disease on CT or exam and CA125 normal.  Interval TLH/Omentectomy 7/16 negative for malignancy. Persistent and slightly worsening abdominal pain, CT scan negative for metastatic disease, diverticular disease present. Persistent pain of uncertain etiology - GI diagnosis is bacterial overgrowth and adhesive disease. Concern for recurrent - CT scans and PET scan all negative for disease.   Menopausal symptoms, controlled.   Multiple medical complaints and issues.     Plan:   Problem List Items  Addressed This Visit      Genitourinary   Malignant neoplasm of ovary ([place]) - Primary     Other   Menopause     GI complaints most likely due to bacterial overgrowth given improved with antibiotic. Antibiotic therapy limited by side effects. She will also follow up with GI.   GI symptoms probably also due to adhesive disease.   [id number] completion visit today. She will see Dr. Rogue Bussing for her next visit in 3 months and return to our clinic in 6 months as long as imaging negative.   I personally reviewed the patient's history, completed key elements of her exam, and was involved in decision making in conjunction with Ms. Beckey Richardson.   Gillis Ends, MD   ADDENDUM: Only BRCA1 and 2 sequencing was performed. Testing was negative.  I discussed with Dr. Rogue Bussing and he will order panel testing.  Gillis Ends, MD   CC:  Dr. Prentice Docker  Dr. Blain Pais

## 2017-03-15 NOTE — Progress Notes (Signed)
[name] MD, MRCP(U.K) [address]  [place]  [place], [place] [address]  Main: [phone]  Fax: [phone]   Primary Care Physician: Tower, [name], MD  Primary Gastroenterologist:  Dr. [name]   Chief Complaint  Patient presents with  . Follow-up    HPI: Debbie Richardson is a 56 y.o. female   Summary of history : She is here for follow up. 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 Undergone BSO in 2016 . CT abdomen 12/2015- no evidence of metastatic disease.  Colonoscopy in 04/2014 showed diverticulosis and internal hemorroids. EGD 04/2014 showed - small gastric polyps(fundic gland polyps) otherwise normal.I treated her empirically with doxycycline for 2 weeks , I felt that the adhesions from surgery were likely contributing to stasis and small bowel bacterial overgrowth. Completed 2 rounds of doxycycline.Symptoms recurred in mid October- similar to the past - pain , throbbing in nature, no bloating-she called our office, started doxycycline-felt better- all of a sudden felt it stopped working . Started to watch what she was eating . Towards the end of doxycycline noticed some bloating, no pain but has pressure.CBC and CMP was normal in 12/2016.     Interval history 02/20/2017 -03/15/17  02/27/17- Ct abdomen - diverticulosis of the colon , low stool burden.  Doing well , no bloating or gas or any symptoms.   Current Outpatient Medications  Medication Sig Dispense Refill  . acetaminophen (TYLENOL) 500 MG tablet Take 2 tablets (1,000 mg total) by mouth every 6 (six) hours. (Patient taking differently: Take 1,000 mg by mouth every 6 (six) hours as needed for mild pain, moderate pain, fever or headache. ) 30 tablet 0  . omeprazole (PRILOSEC) 40 MG capsule Take 1 capsule (40 mg total) by mouth daily. 30 capsule 2   No  current facility-administered medications for this visit.    Facility-Administered Medications Ordered in Other Visits  Medication Dose Route Frequency Provider Last Rate Last Dose  . sodium chloride 0.9 % injection 10 mL  10 mL Intravenous PRN Forest Gleason, MD   10 mL at 11/19/14 1628  . sodium chloride flush (NS) 0.9 % injection 10 mL  10 mL Intravenous PRN Cammie Sickle, MD   10 mL at 10/28/15 1135  . sodium chloride flush (NS) 0.9 % injection 10 mL  10 mL Intravenous PRN Cammie Sickle, MD   10 mL at 10/28/16 1604    Allergies as of 03/15/2017 - Review Complete 03/15/2017  Allergen Reaction Noted  . Ciprofloxacin Anaphylaxis 08/19/2013  . Paclitaxel Shortness Of Breath and Other (See Comments) 06/27/2014  . Flagyl [metronidazole] Other (See Comments) 06/22/2015    ROS:  General: Negative for anorexia, weight loss, fever, chills, fatigue, weakness. ENT: Negative for hoarseness, difficulty swallowing , nasal congestion. CV: Negative for chest pain, angina, palpitations, dyspnea on exertion, peripheral edema.  Respiratory: Negative for dyspnea at rest, dyspnea on exertion, cough, sputum, wheezing.  GI: See history of present illness. GU:  Negative for dysuria, hematuria, urinary incontinence, urinary frequency, nocturnal urination.  Endo: Negative for unusual weight change.    Physical Examination:   BP 114/78 (BP Location: Right Arm, Patient Position: Sitting, Cuff Size: Normal)   Pulse 81   Temp 98.3 F (36.8 C) (Oral)   Ht 5\' 3"  (1.6 m)   Wt 131 lb 9.6 oz (59.7 kg)  LMP 03/30/2013   BMI 23.31 kg/m   General: Well-nourished, well-developed in no acute distress.  Eyes: No icterus. Conjunctivae pink. Mouth: Oropharyngeal mucosa moist and pink , no lesions erythema or exudate. Lungs: Clear to auscultation bilaterally. Non-labored. Heart: Regular rate and rhythm, no murmurs rubs or gallops.  Abdomen: Bowel sounds are normal, nontender, nondistended, no  hepatosplenomegaly or masses, no abdominal bruits or hernia , no rebound or guarding.   Extremities: No lower extremity edema. No clubbing or deformities. Neuro: Alert and oriented x 3.  Grossly intact. Skin: Warm and dry, no jaundice.   Psych: Alert and cooperative, normal mood and affect.   Imaging Studies: Ct Abdomen Pelvis W Contrast  Result Date: 02/27/2017 CLINICAL DATA:  56 year old female with history of left lower quadrant discomfort. Constipation. Worsening symptoms over the past 1-1/2 weeks. Additional history of ovarian cancer status post total abdominal hysterectomy and bilateral salpingo-oophorectomy in 2016 followed by chemotherapy. EXAM: CT ABDOMEN AND PELVIS WITH CONTRAST TECHNIQUE: Multidetector CT imaging of the abdomen and pelvis was performed using the standard protocol following bolus administration of intravenous contrast. CONTRAST:  13mL ISOVUE-300 IOPAMIDOL (ISOVUE-300) INJECTION 61% COMPARISON:  CT the abdomen and pelvis 12/09/2015. FINDINGS: Lower chest: Unremarkable. Hepatobiliary: No cystic or solid hepatic lesions. No intra or extrahepatic biliary ductal dilatation. Status post cholecystectomy. Pancreas: No pancreatic mass. No pancreatic ductal dilatation. No pancreatic or peripancreatic fluid or inflammatory changes. Spleen: Unremarkable. Adrenals/Urinary Tract: Bilateral kidneys and bilateral adrenal glands are normal in appearance. No hydroureteronephrosis. Urinary bladder is normal in appearance. Stomach/Bowel: Normal appearance of the stomach. No pathologic dilatation of small bowel or colon. Several colonic diverticulae are noted most evident in the sigmoid colon, without surrounding inflammatory changes to suggest an acute diverticulitis at this time. Stool burden is low despite the reported history of constipation. Normal appendix. Vascular/Lymphatic: No significant atherosclerotic disease, and no aneurysm or dissection in the abdominal or pelvic vasculature. No  lymphadenopathy noted in the abdomen or pelvis. Reproductive: Status post total abdominal hysterectomy and bilateral salpingo-oophorectomy. No unexpected soft tissue mass noted in the low anatomic pelvis to suggest locally recurrent disease. Other: No significant volume of ascites. No pneumoperitoneum. No definite soft tissue nodularity noted in the peritoneal cavity. Musculoskeletal: There are no aggressive appearing lytic or blastic lesions noted in the visualized portions of the skeleton. IMPRESSION: 1. No acute findings are noted in the abdomen or pelvis to account for the patient's symptoms. Notably, there is a low stool burden despite the patient's reported history of constipation. 2. Colonic diverticulosis without evidence of acute diverticulitis at this time. 3. Additional incidental findings, as above. Electronically Signed   By: Vinnie Langton M.D.   On: 02/27/2017 08:56    Assessment and Plan:   Debbie Richardson is a 56 y.o. 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 It is likely adhesions from surgery has contributed to bacterial overgrowth.Responded very well to Xifaxan for 10 days with complete resolution of symotoms.    Dr [name]  MD,MRCP [facility]) Follow up as needed.

## 2017-03-20 ENCOUNTER — Inpatient Hospital Stay: Payer: BLUE CROSS/BLUE SHIELD

## 2017-03-20 DIAGNOSIS — Z9221 Personal history of antineoplastic chemotherapy: Secondary | ICD-10-CM | POA: Diagnosis not present

## 2017-03-20 DIAGNOSIS — K5730 Diverticulosis of large intestine without perforation or abscess without bleeding: Secondary | ICD-10-CM | POA: Diagnosis not present

## 2017-03-20 DIAGNOSIS — Z90722 Acquired absence of ovaries, bilateral: Secondary | ICD-10-CM | POA: Diagnosis not present

## 2017-03-20 DIAGNOSIS — Z8543 Personal history of malignant neoplasm of ovary: Secondary | ICD-10-CM | POA: Diagnosis not present

## 2017-03-20 DIAGNOSIS — Z79899 Other long term (current) drug therapy: Secondary | ICD-10-CM | POA: Diagnosis not present

## 2017-03-20 DIAGNOSIS — Z90710 Acquired absence of both cervix and uterus: Secondary | ICD-10-CM | POA: Diagnosis not present

## 2017-03-20 DIAGNOSIS — Z006 Encounter for examination for normal comparison and control in clinical research program: Secondary | ICD-10-CM | POA: Diagnosis not present

## 2017-03-20 DIAGNOSIS — K219 Gastro-esophageal reflux disease without esophagitis: Secondary | ICD-10-CM | POA: Diagnosis not present

## 2017-03-20 DIAGNOSIS — Z87891 Personal history of nicotine dependence: Secondary | ICD-10-CM | POA: Diagnosis not present

## 2017-03-20 DIAGNOSIS — L409 Psoriasis, unspecified: Secondary | ICD-10-CM | POA: Diagnosis not present

## 2017-03-20 DIAGNOSIS — Z780 Asymptomatic menopausal state: Secondary | ICD-10-CM

## 2017-03-20 DIAGNOSIS — R1030 Lower abdominal pain, unspecified: Secondary | ICD-10-CM

## 2017-03-20 DIAGNOSIS — C569 Malignant neoplasm of unspecified ovary: Secondary | ICD-10-CM

## 2017-03-20 MED ORDER — SODIUM CHLORIDE 0.9% FLUSH
10.0000 mL | INTRAVENOUS | Status: DC | PRN
  Administered 2017-03-20: 10 mL via INTRAVENOUS
  Filled 2017-03-20: qty 10

## 2017-03-20 MED ORDER — HEPARIN SOD (PORK) LOCK FLUSH 100 UNIT/ML IV SOLN
500.0000 [IU] | Freq: Once | INTRAVENOUS | Status: AC
  Administered 2017-03-20: 500 [IU] via INTRAVENOUS

## 2017-03-21 LAB — CA 125: CANCER ANTIGEN (CA) 125: 9.9 U/mL (ref 0.0–38.1)

## 2017-03-22 ENCOUNTER — Inpatient Hospital Stay: Payer: BLUE CROSS/BLUE SHIELD

## 2017-04-26 ENCOUNTER — Inpatient Hospital Stay: Payer: BLUE CROSS/BLUE SHIELD | Attending: Internal Medicine

## 2017-04-26 DIAGNOSIS — Z8543 Personal history of malignant neoplasm of ovary: Secondary | ICD-10-CM | POA: Diagnosis not present

## 2017-04-26 DIAGNOSIS — Z95828 Presence of other vascular implants and grafts: Secondary | ICD-10-CM

## 2017-04-26 MED ORDER — HEPARIN SOD (PORK) LOCK FLUSH 100 UNIT/ML IV SOLN
500.0000 [IU] | INTRAVENOUS | Status: AC | PRN
  Administered 2017-04-26: 500 [IU]

## 2017-04-26 MED ORDER — SODIUM CHLORIDE 0.9% FLUSH
10.0000 mL | INTRAVENOUS | Status: AC | PRN
  Administered 2017-04-26: 10 mL
  Filled 2017-04-26: qty 10

## 2017-06-14 ENCOUNTER — Encounter: Payer: Self-pay | Admitting: Internal Medicine

## 2017-06-14 ENCOUNTER — Inpatient Hospital Stay (HOSPITAL_BASED_OUTPATIENT_CLINIC_OR_DEPARTMENT_OTHER): Payer: BLUE CROSS/BLUE SHIELD | Admitting: Internal Medicine

## 2017-06-14 ENCOUNTER — Other Ambulatory Visit: Payer: Self-pay | Admitting: Internal Medicine

## 2017-06-14 ENCOUNTER — Inpatient Hospital Stay: Payer: BLUE CROSS/BLUE SHIELD | Attending: Internal Medicine

## 2017-06-14 VITALS — BP 127/89 | HR 88 | Temp 98.9°F | Resp 16 | Wt 132.6 lb

## 2017-06-14 DIAGNOSIS — C569 Malignant neoplasm of unspecified ovary: Secondary | ICD-10-CM

## 2017-06-14 DIAGNOSIS — Z8543 Personal history of malignant neoplasm of ovary: Secondary | ICD-10-CM | POA: Insufficient documentation

## 2017-06-14 DIAGNOSIS — Z95828 Presence of other vascular implants and grafts: Secondary | ICD-10-CM

## 2017-06-14 LAB — COMPREHENSIVE METABOLIC PANEL
ALT: 18 U/L (ref 14–54)
ANION GAP: 10 (ref 5–15)
AST: 22 U/L (ref 15–41)
Albumin: 4.4 g/dL (ref 3.5–5.0)
Alkaline Phosphatase: 47 U/L (ref 38–126)
BUN: 12 mg/dL (ref 6–20)
CALCIUM: 9 mg/dL (ref 8.9–10.3)
CHLORIDE: 106 mmol/L (ref 101–111)
CO2: 22 mmol/L (ref 22–32)
CREATININE: 0.49 mg/dL (ref 0.44–1.00)
Glucose, Bld: 102 mg/dL — ABNORMAL HIGH (ref 65–99)
Potassium: 3.9 mmol/L (ref 3.5–5.1)
SODIUM: 138 mmol/L (ref 135–145)
Total Bilirubin: 1 mg/dL (ref 0.3–1.2)
Total Protein: 7.1 g/dL (ref 6.5–8.1)

## 2017-06-14 LAB — CBC WITH DIFFERENTIAL/PLATELET
BASOS PCT: 1 %
Basophils Absolute: 0 10*3/uL (ref 0–0.1)
EOS ABS: 0 10*3/uL (ref 0–0.7)
Eosinophils Relative: 1 %
HEMATOCRIT: 37.3 % (ref 35.0–47.0)
HEMOGLOBIN: 13.1 g/dL (ref 12.0–16.0)
Lymphocytes Relative: 33 %
Lymphs Abs: 1.3 10*3/uL (ref 1.0–3.6)
MCH: 32.5 pg (ref 26.0–34.0)
MCHC: 35.2 g/dL (ref 32.0–36.0)
MCV: 92.5 fL (ref 80.0–100.0)
Monocytes Absolute: 0.3 10*3/uL (ref 0.2–0.9)
Monocytes Relative: 7 %
NEUTROS ABS: 2.5 10*3/uL (ref 1.4–6.5)
NEUTROS PCT: 58 %
Platelets: 199 10*3/uL (ref 150–440)
RBC: 4.03 MIL/uL (ref 3.80–5.20)
RDW: 12.6 % (ref 11.5–14.5)
WBC: 4.1 10*3/uL (ref 3.6–11.0)

## 2017-06-14 MED ORDER — SODIUM CHLORIDE 0.9% FLUSH
10.0000 mL | INTRAVENOUS | Status: AC | PRN
  Administered 2017-06-14: 10 mL
  Filled 2017-06-14: qty 10

## 2017-06-14 MED ORDER — HEPARIN SOD (PORK) LOCK FLUSH 100 UNIT/ML IV SOLN
500.0000 [IU] | INTRAVENOUS | Status: AC | PRN
  Administered 2017-06-14: 500 [IU]

## 2017-06-14 NOTE — Assessment & Plan Note (Addendum)
#  Stage II ovarian ca- status post chemotherapy followed by surgery. Clinically NED C125-negative.  CT December 20 18- for recurrence.  #Abdominal pain- bloating/back pain-status post GI evaluation currently improved/resolved after ?rifaximin.   # GOG study; discussed with RN- patient on control arm.  # Genetic testing- BRCA- 1&2- NEG; will refer to genetic Counsellor  .  # port fush- every 2 months.  # follow up in 6 months/labs- ca-125.

## 2017-06-14 NOTE — Progress Notes (Signed)
[place] OFFICE PROGRESS NOTE  Patient Care Team: Tower, [name], MD as PCP - General [name], RN as Registered Nurse  Cancer Staging No matching staging information was found for the patient.    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  Finished total 6 cycles of chemotherapy with carboplatinum and Taxotere June of 2016  # Patient underwent exploratory laparotomy bilateral oophorectomy and omentectomy in July of 2016 NO evidence of malignancy was found.   CT SEP 6th 2017- NED  # GOG 0225 [diet/excerise- on control arm]  # chronic abdominal discomfort- ? Bacterial overgrowth syndrome [dr.Anna]   # Severe reaction to Taxol; also post de-sensitization [hives]  # Genetic testing- NEG for BRCA 1&2; as per pt; refer to [facility].      Neoplasm, ovary, malignant, unspecified laterality ([place])      INTERVAL HISTORY:  Debbie Richardson 57 y.o.  female pleasant patient above history of Stage II ovarian cancer currently on surveillance is here for follow-up.  In the interim patient abdominal pain is resolved.  Denies any abdominal discomfort or bloating.Marland Kitchen   Her appetite is good.  No nausea no vomiting.  REVIEW OF SYSTEMS:  A complete 10 point review of system is done which is negative except mentioned above/history of present illness.   PAST MEDICAL HISTORY :  Past Medical History:  Diagnosis Date  . Anxiety   . Arthritis   . Benign fundic gland polyps of stomach   . Dizziness    vertigo  . Gallstones   . GERD (gastroesophageal reflux disease)   . Ovarian cancer ([place]) 2016    PAST SURGICAL HISTORY :   Past Surgical History:  Procedure Laterality Date  . CESAREAN SECTION    . CHOLECYSTECTOMY    . COLONOSCOPY    . LAPAROSCOPIC HYSTERECTOMY N/A 10/29/2014   Procedure: HYSTERECTOMY TOTAL LAPAROSCOPIC, OMENTUMECTOMY;  Surgeon: Gillis Ends, MD;   Location: [facility] ORS;  Service: Gynecology;  Laterality: N/A;  . LAPAROSCOPIC OOPHERECTOMY    . OOPHORECTOMY    . UPPER GI ENDOSCOPY      FAMILY HISTORY :   Family History  Problem Relation Age of Onset  . Hypertension Mother   . Hyperlipidemia Mother   . GER disease Mother   . Heart disease Father   . Heart disease Paternal Grandmother   . Breast cancer Paternal Grandmother 85  . Kidney disease Neg Hx   . Liver disease Neg Hx   . Colon cancer Neg Hx   . Colon polyps Neg Hx   . Esophageal cancer Neg Hx   . Pancreatic cancer Neg Hx     SOCIAL HISTORY:   Social History   Tobacco Use  . Smoking status: Former Smoker    Last attempt to quit: 04/05/1991    Years since quitting: 26.2  . Smokeless tobacco: Never Used  Substance Use Topics  . Alcohol use: Yes    Alcohol/week: 0.0 oz    Comment: 1 drink of wine daily  . Drug use: No    ALLERGIES:  is allergic to ciprofloxacin; paclitaxel; and flagyl [metronidazole].  MEDICATIONS:  Current Outpatient Medications  Medication Sig Dispense Refill  . acetaminophen (TYLENOL) 500 MG tablet Take 2 tablets (1,000 mg total) by mouth every 6 (six) hours. (Patient taking differently: Take 1,000 mg by mouth every 6 (six) hours as needed for mild pain, moderate pain, fever or headache. )  30 tablet 0  . omeprazole (PRILOSEC) 40 MG capsule Take 1 capsule (40 mg total) by mouth daily. 30 capsule 2   No current facility-administered medications for this visit.    Facility-Administered Medications Ordered in Other Visits  Medication Dose Route Frequency Provider Last Rate Last Dose  . sodium chloride 0.9 % injection 10 mL  10 mL Intravenous PRN Forest Gleason, MD   10 mL at 11/19/14 1628  . sodium chloride flush (NS) 0.9 % injection 10 mL  10 mL Intravenous PRN Cammie Sickle, MD   10 mL at 10/28/15 1135  . sodium chloride flush (NS) 0.9 % injection 10 mL  10 mL Intravenous PRN Cammie Sickle, MD   10 mL at 10/28/16 1604  . sodium  chloride flush (NS) 0.9 % injection 10 mL  10 mL Intravenous PRN Cammie Sickle, MD   10 mL at 03/20/17 0831    PHYSICAL EXAMINATION: ECOG PERFORMANCE STATUS: 0 - Asymptomatic  BP 127/89 (BP Location: Left Arm, Patient Position: Sitting)   Pulse 88   Temp 98.9 F (37.2 C) (Tympanic)   Resp 16   Wt 132 lb 9.6 oz (60.1 kg)   LMP 03/30/2013   BMI 23.49 kg/m   Filed Weights   06/14/17 1147  Weight: 132 lb 9.6 oz (60.1 kg)    GENERAL: Well-nourished well-developed; Alert, no distress and comfortable.   alone EYES: no pallor or icterus OROPHARYNX: no thrush or ulceration; good dentition  NECK: supple, no masses felt LYMPH:  no palpable lymphadenopathy in the cervical, axillary or inguinal regions LUNGS: clear to auscultation and  No wheeze or crackles HEART/CVS: regular rate & rhythm and no murmurs; No lower extremity edema ABDOMEN:abdomen soft, non-tender and normal bowel sounds Musculoskeletal:no cyanosis of digits and no clubbing  PSYCH: alert & oriented x 3 with fluent speech NEURO: no focal motor/sensory deficits SKIN:  no rashes or significant lesions  LABORATORY DATA:  I have reviewed the data as listed    Component Value Date/Time   NA 138 06/14/2017 1135   NA 135 07/14/2014 0955   K 3.9 06/14/2017 1135   K 3.2 (L) 07/21/2014 1518   CL 106 06/14/2017 1135   CL 106 07/14/2014 0955   CO2 22 06/14/2017 1135   CO2 21 (L) 07/14/2014 0955   GLUCOSE 102 (H) 06/14/2017 1135   GLUCOSE 138 (H) 07/14/2014 0955   BUN 12 06/14/2017 1135   BUN 10 07/14/2014 0955   CREATININE 0.49 06/14/2017 1135   CREATININE 0.60 07/14/2014 0955   CALCIUM 9.0 06/14/2017 1135   CALCIUM 9.1 07/14/2014 0955   PROT 7.1 06/14/2017 1135   PROT 7.1 07/14/2014 0955   ALBUMIN 4.4 06/14/2017 1135   ALBUMIN 4.4 07/14/2014 0955   AST 22 06/14/2017 1135   AST 24 07/14/2014 0955   ALT 18 06/14/2017 1135   ALT 20 07/14/2014 0955   ALKPHOS 47 06/14/2017 1135   ALKPHOS 45 07/14/2014 0955    BILITOT 1.0 06/14/2017 1135   BILITOT 0.7 07/14/2014 0955   GFRNONAA >60 06/14/2017 1135   GFRNONAA >60 07/14/2014 0955   GFRAA >60 06/14/2017 1135   GFRAA >60 07/14/2014 0955    No results found for: SPEP, UPEP  Lab Results  Component Value Date   WBC 4.1 06/14/2017   NEUTROABS 2.5 06/14/2017   HGB 13.1 06/14/2017   HCT 37.3 06/14/2017   MCV 92.5 06/14/2017   PLT 199 06/14/2017      Chemistry  Component Value Date/Time   NA 138 06/14/2017 1135   NA 135 07/14/2014 0955   K 3.9 06/14/2017 1135   K 3.2 (L) 07/21/2014 1518   CL 106 06/14/2017 1135   CL 106 07/14/2014 0955   CO2 22 06/14/2017 1135   CO2 21 (L) 07/14/2014 0955   BUN 12 06/14/2017 1135   BUN 10 07/14/2014 0955   CREATININE 0.49 06/14/2017 1135   CREATININE 0.60 07/14/2014 0955      Component Value Date/Time   CALCIUM 9.0 06/14/2017 1135   CALCIUM 9.1 07/14/2014 0955   ALKPHOS 47 06/14/2017 1135   ALKPHOS 45 07/14/2014 0955   AST 22 06/14/2017 1135   AST 24 07/14/2014 0955   ALT 18 06/14/2017 1135   ALT 20 07/14/2014 0955   BILITOT 1.0 06/14/2017 1135   BILITOT 0.7 07/14/2014 0955       RADIOGRAPHIC STUDIES: I have personally reviewed the radiological images as listed and agreed with the findings in the report. No results found.   ASSESSMENT & PLAN:  Neoplasm, ovary, malignant, unspecified laterality ([place]) # Stage II ovarian ca- status post chemotherapy followed by surgery. Clinically NED C125-negative.  CT December 20 18- for recurrence.  #Abdominal pain- bloating/back pain-status post GI evaluation currently improved/resolved after ?rifaximin.   # GOG study; discussed with RN- patient on control arm.  # Genetic testing- BRCA- 1&2- NEG; will refer to genetic Counsellor  .  # port fush- every 2 months.  # follow up in 6 months/labs- ca-125.    Orders Placed This Encounter  Procedures  . CBC with Differential    Standing Status:   Future    Standing Expiration Date:   06/15/2018   . Comprehensive metabolic panel    Standing Status:   Future    Standing Expiration Date:   06/15/2018  . CA 125    Standing Status:   Future    Standing Expiration Date:   06/15/2018   All questions were answered. The patient knows to call the clinic with any problems, questions or concerns.      Cammie Sickle, MD 06/14/2017 12:24 PM

## 2017-06-15 ENCOUNTER — Telehealth: Payer: Self-pay | Admitting: *Deleted

## 2017-06-15 ENCOUNTER — Telehealth: Payer: Self-pay | Admitting: Genetic Counselor

## 2017-06-15 LAB — CA 125: Cancer Antigen (CA) 125: 11.4 U/mL (ref 0.0–38.1)

## 2017-06-15 NOTE — Telephone Encounter (Signed)
Dr. Rogue Bussing is referring Debbie Richardson for genetic counseling due to a personal history of cancer. I spoke with her today and she opted schedule this telegenetics visit on Friday, April 12 at [place], [place], [facility] Genetic Counselor Phone: [phone]

## 2017-06-15 NOTE — Telephone Encounter (Signed)
T/C made to patient Debbie Richardson to inform of her CA125 result - at patient's request. CA125 was 11.4 and informed patient that this was slightly increased from her level 3 months ago, but well within normal limits. Patient voices satisfaction with this result and states she is exhausted just from her visit yesterday. States she will be waiting for a call from the genetic counselor now. [name], BSN, MHA, OCN 06/15/2017 8:57 AM

## 2017-07-12 ENCOUNTER — Ambulatory Visit: Payer: BLUE CROSS/BLUE SHIELD | Admitting: Gastroenterology

## 2017-07-12 ENCOUNTER — Encounter: Payer: Self-pay | Admitting: Gastroenterology

## 2017-07-12 VITALS — BP 122/84 | HR 87 | Temp 98.1°F | Resp 16 | Ht 62.0 in | Wt 129.6 lb

## 2017-07-12 DIAGNOSIS — K6389 Other specified diseases of intestine: Secondary | ICD-10-CM

## 2017-07-12 DIAGNOSIS — R140 Abdominal distension (gaseous): Secondary | ICD-10-CM

## 2017-07-12 NOTE — Progress Notes (Signed)
[name] MD, MRCP(U.K) [address]  [place]  [place], [place] [address]  Main: [phone]  Fax: [phone]   Primary Care Physician: Tower, [name], MD  Primary Gastroenterologist:  Dr. [name]   Chief Complaint  Patient presents with  . Abdominal Pain    lower abdominal, pressure, 1 weeks, took abx     HPI: Debbie Richardson is a 57 y.o. female    Summary of history : She is here for follow up. 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 Undergone BSO in 2016 . CT abdomen 12/2015- no evidence of metastatic disease.  02/27/17- Ct abdomen - diverticulosis of the colon , low stool burden.    Colonoscopy in 04/2014 showed diverticulosis and internal hemorroids. EGD 04/2014 showed - small gastric polyps(fundic gland polyps) otherwise normal.I treated her empirically with doxycycline for 2 weeks , I felt that the adhesions from surgery were likely contributing to stasis and small bowel bacterial overgrowth. Completed 2 rounds of doxycycline.Symptoms recurred in mid October- similar to the past - pain , throbbing in nature, no bloating-she called our office, started doxycycline-felt better- all of a sudden felt it stopped working . Started to watch what she was eating . Towards the end of doxycycline noticed some bloating, no pain but has pressure.CBC and CMP was normal in 12/2016.    Interval history 03/15/17-07/12/17   Doing well , no bloating or gas or any symptoms.    She says she was doing really well till last Wednesday and had a pasta dish with pine nuts and then developed symptoms. Took Xifaxan once a day  which she had samples, symptoms was severe due to pressure and pain , rumbling sounds. Changed to a diet with broth, feels better . Feels she is improving .   Current Outpatient Medications  Medication Sig Dispense  Refill  . acetaminophen (TYLENOL) 500 MG tablet Take 2 tablets (1,000 mg total) by mouth every 6 (six) hours. (Patient taking differently: Take 1,000 mg by mouth every 6 (six) hours as needed for mild pain, moderate pain, fever or headache. ) 30 tablet 0  . omeprazole (PRILOSEC) 40 MG capsule Take 1 capsule (40 mg total) by mouth daily. 30 capsule 2   No current facility-administered medications for this visit.    Facility-Administered Medications Ordered in Other Visits  Medication Dose Route Frequency Provider Last Rate Last Dose  . sodium chloride 0.9 % injection 10 mL  10 mL Intravenous PRN Forest Gleason, MD   10 mL at 11/19/14 1628  . sodium chloride flush (NS) 0.9 % injection 10 mL  10 mL Intravenous PRN Cammie Sickle, MD   10 mL at 10/28/15 1135  . sodium chloride flush (NS) 0.9 % injection 10 mL  10 mL Intravenous PRN Cammie Sickle, MD   10 mL at 10/28/16 1604  . sodium chloride flush (NS) 0.9 % injection 10 mL  10 mL Intravenous PRN Cammie Sickle, MD   10 mL at 03/20/17 0831    Allergies as of 07/12/2017 - Review Complete 07/12/2017  Allergen Reaction Noted  . Ciprofloxacin Anaphylaxis 08/19/2013  . Paclitaxel Shortness Of Breath and Other (See Comments) 06/27/2014  . Flagyl [metronidazole] Other (See Comments) 06/22/2015    ROS:  General: Negative for anorexia, weight loss, fever, chills, fatigue, weakness. ENT: Negative for hoarseness, difficulty swallowing ,  nasal congestion. CV: Negative for chest pain, angina, palpitations, dyspnea on exertion, peripheral edema.  Respiratory: Negative for dyspnea at rest, dyspnea on exertion, cough, sputum, wheezing.  GI: See history of present illness. GU:  Negative for dysuria, hematuria, urinary incontinence, urinary frequency, nocturnal urination.  Endo: Negative for unusual weight change.    Physical Examination:   BP 122/84   Pulse 87   Temp 98.1 F (36.7 C) (Oral)   Resp 16   Ht 5\' 2"  (1.575 m)   Wt  129 lb 9.6 oz (58.8 kg)   LMP 03/30/2013   SpO2 98%   BMI 23.70 kg/m   General: Well-nourished, well-developed in no acute distress.  Eyes: No icterus. Conjunctivae pink. Mouth: Oropharyngeal mucosa moist and pink , no lesions erythema or exudate. Lungs: Clear to auscultation bilaterally. Non-labored. Heart: Regular rate and rhythm, no murmurs rubs or gallops.  Abdomen: Bowel sounds are normal, nontender, nondistended, no hepatosplenomegaly or masses, no abdominal bruits or hernia , no rebound or guarding.   Extremities: No lower extremity edema. No clubbing or deformities. Neuro: Alert and oriented x 3.  Grossly intact. Skin: Warm and dry, no jaundice.   Psych: Alert and cooperative, normal mood and affect.   Imaging Studies: No results found.  Assessment and Plan:   Debbie Richardson is a 57 y.o. y/o female here for follow up for abdominal pain likely secondary to bloating fromsmall bowel bacterial overgrowth syndrome with secondary lactose intolerance. It is likely adhesions from surgery has contributed to bacterial overgrowth.Responded very well to Xifaxan for 10 days with complete resolution of symotoms. SIBO is notorious for recurrence and can do so in upto 50% and often requires multiple rounds of antibiotics.This episode sounds like an episode of food poisoning with recurrence of SIBO, she is doing well. I will give her a few more days of samples of Xifaxan to use,   Dr [name]  MD,MRCP [facility]) Follow up PRN

## 2017-07-14 ENCOUNTER — Other Ambulatory Visit: Payer: Self-pay | Admitting: Genetic Counselor

## 2017-07-14 ENCOUNTER — Telehealth: Payer: Self-pay | Admitting: Genetic Counselor

## 2017-07-14 ENCOUNTER — Encounter: Payer: Self-pay | Admitting: Genetic Counselor

## 2017-07-14 DIAGNOSIS — Z8543 Personal history of malignant neoplasm of ovary: Secondary | ICD-10-CM

## 2017-07-14 NOTE — Telephone Encounter (Signed)
Cancer Genetics            Telegenetics Initial Visit    Patient Name: Debbie Richardson Patient DOB: 19-Sep-1960 Patient Age: 57 y.o. Phone Call Date: 07/14/2017  Referring Provider: Charlaine Dalton, MD  Reason for Visit: Evaluate for hereditary susceptibility to cancer    Assessment and Plan:  . Debbie Richardson's family history is not suggestive of a hereditary predisposition to cancer, but we discussed that we may not be seeing cancer for a couple of reasons: Her father was an only child and her mother and all maternal relatives had TAH/BSO prior to age 79. A genetics evaluation is indicated to rule out a non-BRCA mutation as the cause of her ovarian cancer.   . Testing is recommended to determine whether she has a pathogenic mutation that will impact her screening and risk-reduction for cancer. A negative result will be reassuring.  . Debbie Richardson wished to pursue genetic testing, but wanted her insurance checked first to know whether there will be any cost to her. She agreed to go ahead and schedule a lab visit for a blood draw. Analysis will include the 83 genes on [facility]'s Multi-Cancer panel (ALK, APC, ATM, AXIN2, BAP1, BARD1, BLM, BMPR1A, BRCA1, BRCA2, BRIP1, CASR, CDC73, CDH1, CDK4, CDKN1B, CDKN1C, CDKN2A, CEBPA, CHEK2, CTNNA1, DICER1, DIS3L2, EGFR, EPCAM, FH, FLCN, GATA2, GPC3, GREM1, HOXB13, HRAS, KIT, MAX, MEN1, MET, MITF, MLH1, MSH2, MSH3, MSH6, MUTYH, NBN, NF1, NF2, NTHL1, PALB2, PDGFRA, PHOX2B, PMS2, POLD1, POLE, POT1, PRKAR1A, PTCH1, PTEN, RAD50, RAD51C, RAD51D, RB1, RECQL4, RET, RUNX1, SDHA, SDHAF2, SDHB, SDHC, SDHD, SMAD4, SMARCA4, SMARCB1, SMARCE1, STK11, SUFU, TERC, TERT, TMEM127, TP53, TSC1, TSC2, VHL, WRN, WT1).   . Once the lab receives her specimen, results should be available in approximately 2-3 weeks, at which point we will contact her and address implications for her as well as address genetic testing for at-risk family members, if needed.     Dr.  Grayland Ormond was available for questions concerning this case. Total time spent by counseling by phone was approximately 25 minutes.   _____________________________________________________________________   History of Present Illness: Debbie Richardson, a 57 y.o. female, was referred for genetic counseling to discuss the possibility of a hereditary predisposition to cancer and discuss whether genetic testing is warranted. This was a telegenetics visit via phone.  Debbie Richardson was diagnosed with ovarian cancer at the age of 62. She is s/p surgery and chemotherapy..  She had negative BRCA1/BRCA2 testing through [employer] in 2016. These were reviewed in Epic.  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  Finished total 6 cycles of chemotherapy with carboplatinum and Taxotere June of 2016  # Patient underwent exploratory laparotomy bilateral oophorectomy and omentectomy in July of 2016 NO evidence of malignancy was found.   CT SEP 6th 2017- NED  # GOG 0225 [diet/excerise- on control arm]  # chronic abdominal discomfort- ? Bacterial overgrowth syndrome [dr.Anna]   # Severe reaction to Taxol; also post de-sensitization [hives]  # Genetic testing- NEG for BRCA 1&2; as per pt; refer to [facility].      Neoplasm, ovary, malignant, unspecified laterality [facility])    Past Medical History:  Diagnosis Date  . Anxiety   . Arthritis   . Benign fundic gland polyps of stomach   . Dizziness    vertigo  . Gallstones   . GERD (  gastroesophageal reflux disease)   . Ovarian cancer ([place]) 2016    Past Surgical History:  Procedure Laterality Date  . CESAREAN SECTION    . CHOLECYSTECTOMY    . COLONOSCOPY    . LAPAROSCOPIC HYSTERECTOMY N/A 10/29/2014   Procedure: HYSTERECTOMY TOTAL LAPAROSCOPIC, OMENTUMECTOMY;  Surgeon: Gillis Ends, MD;  Location: [facility] ORS;  Service: Gynecology;  Laterality: N/A;  .  LAPAROSCOPIC OOPHERECTOMY    . OOPHORECTOMY    . UPPER GI ENDOSCOPY      Family History: Significant diagnoses include the following:  Family History  Problem Relation Age of Onset  . Hypertension Mother        14yo   . Hyperlipidemia Mother   . GER disease Mother   . Heart disease Father        62yo  . Heart disease Paternal Grandmother   . Breast cancer Paternal Grandmother 46       deceased 40  . Kidney disease Neg Hx   . Liver disease Neg Hx   . Colon cancer Neg Hx   . Colon polyps Neg Hx   . Esophageal cancer Neg Hx   . Pancreatic cancer Neg Hx     Additionally, has one daughter (age 34). She has a sister and 2 brothers. Her mother (age 39) has 2 brothers and 2 sisters. Her father (age 46) has no siblings. She notes that her sister, mother, one maternal aunt and all maternal female first cousins had TAH/BSO prior to age 27 (for benign reasons).  Debbie Richardson's ancestry is Caucasian - NOS. There is no known Jewish ancestry and no consanguinity.  Discussion: We reviewed the characteristics, features and inheritance patterns of hereditary cancer syndromes. We discussed her risk of harboring a mutation in the context of her personal and family history. We discussed that her small paternal family and paucity of women makes risk assessment challenging. Regarding her maternal family, we may not be seeing ovarian cancer if truly all her maternal female relatives have had TAH/BSO. We discussed the process of genetic testing, insurance coverage and implications of results: positive, negative and variant of unknown significance (VUS).   Debbie Richardson's questions were answered to her satisfaction today and she is welcome to call with any additional questions or concerns. Thank you for the referral and allowing [place] to share in the care of your patient.    Steele Berg, MS, [place] Certified Genetic Counselor phone: [phone]

## 2017-08-02 ENCOUNTER — Ambulatory Visit: Payer: BLUE CROSS/BLUE SHIELD | Admitting: Family Medicine

## 2017-08-02 ENCOUNTER — Encounter: Payer: Self-pay | Admitting: Family Medicine

## 2017-08-02 VITALS — BP 106/72 | HR 81 | Temp 98.4°F | Ht 62.0 in | Wt 130.2 lb

## 2017-08-02 DIAGNOSIS — R1030 Lower abdominal pain, unspecified: Secondary | ICD-10-CM

## 2017-08-02 DIAGNOSIS — R140 Abdominal distension (gaseous): Secondary | ICD-10-CM | POA: Diagnosis not present

## 2017-08-02 DIAGNOSIS — R3989 Other symptoms and signs involving the genitourinary system: Secondary | ICD-10-CM | POA: Diagnosis not present

## 2017-08-02 LAB — CBC WITH DIFFERENTIAL/PLATELET
BASOS PCT: 0.8 % (ref 0.0–3.0)
Basophils Absolute: 0 10*3/uL (ref 0.0–0.1)
EOS PCT: 0.3 % (ref 0.0–5.0)
Eosinophils Absolute: 0 10*3/uL (ref 0.0–0.7)
HEMATOCRIT: 38 % (ref 36.0–46.0)
Hemoglobin: 13.4 g/dL (ref 12.0–15.0)
LYMPHS PCT: 27.8 % (ref 12.0–46.0)
Lymphs Abs: 1.3 10*3/uL (ref 0.7–4.0)
MCHC: 35.4 g/dL (ref 30.0–36.0)
MCV: 91.8 fl (ref 78.0–100.0)
MONOS PCT: 6.4 % (ref 3.0–12.0)
Monocytes Absolute: 0.3 10*3/uL (ref 0.1–1.0)
Neutro Abs: 3.1 10*3/uL (ref 1.4–7.7)
Neutrophils Relative %: 64.7 % (ref 43.0–77.0)
PLATELETS: 209 10*3/uL (ref 150.0–400.0)
RBC: 4.14 Mil/uL (ref 3.87–5.11)
RDW: 13 % (ref 11.5–15.5)
WBC: 4.8 10*3/uL (ref 4.0–10.5)

## 2017-08-02 LAB — POC URINALSYSI DIPSTICK (AUTOMATED)
BILIRUBIN UA: NEGATIVE
Glucose, UA: NEGATIVE
Ketones, UA: NEGATIVE
Leukocytes, UA: NEGATIVE
Nitrite, UA: NEGATIVE
Protein, UA: NEGATIVE
RBC UA: NEGATIVE
SPEC GRAV UA: 1.01 (ref 1.010–1.025)
Urobilinogen, UA: 0.2 E.U./dL
pH, UA: 6 (ref 5.0–8.0)

## 2017-08-02 NOTE — Assessment & Plan Note (Signed)
Ongoing with pressure See a/p for bloating-related  ua today  Celiac labs and cbc today   ? Role of adhesions-may need to disc with her surgeon

## 2017-08-02 NOTE — Assessment & Plan Note (Signed)
Ongoing  Reviewed GI notes  Hx of small bowel bacterial overgrowth possibly due to adhesions from past gyn surgery  Has responded to xifaxan when needed  Now mainly bloating and discomfort  Does react to wheat and lactose Celiac labs today  ua today as well as cbc  Rev CT/colonoscopy and records  Disc avoidance of swallowing air - other things to prevent gas

## 2017-08-02 NOTE — Patient Instructions (Signed)
Urinalysis today - we will culture urine if it looks abnormal   Also labs for celiac (screening) and cbc  Do follow your symptoms in terms of diet Keep up the water intake

## 2017-08-02 NOTE — Progress Notes (Signed)
Subjective:    Patient ID: Debbie Richardson, female    DOB: 09-11-1960, 57 y.o.   MRN: [id number]  HPI Here for GI problems   Wt Readings from Last 3 Encounters:  08/02/17 130 lb 4 oz (59.1 kg)  07/12/17 129 lb 9.6 oz (58.8 kg)  06/14/17 132 lb 9.6 oz (60.1 kg)   23.82 kg/m   Per pt had a bad bout of diverticulitis-took a while to get better   Has seen GI for abd pain from small bowel bacterial overgrowth (and lactose intol) - with adhesions from gyn surgery adding to it  Was px xifaxan (given samples) -told to take for 3 d for an episode  Also doxy   Last episode Was at a wedding and ate nuts by mistake  Bad symptoms by the time she got back  Had to double dose her medication   At this point feels like scar tissue is causing bloating and also pressing on bladder   Has not gone back to healthy eating- afraid that will worsen her symptoms  Does avoid dairy  Does not take fiber supplements - ate oatmeal instead (but fiber gives her gas)  During an episode-sticks to [place] the surgeon every 6 months  Very reluctant to go in and lyse adhesions   She no longer takes ppi (makes it worse)   Current symptoms- very gassy (flatus)- improves to get up and move Pressure on bladder  No dysuria or blood in urine  occ low back pain  No n/v/d no fever  A little constipation - improving now that she can eat oatmeal  Sitting is uncomfortable   Has tried probiotics more than once    CT ABDOMEN PELVIS W CONTRAST (Accession [id number]) (Order [id number])  Imaging  Date: 02/27/2017 Department: [facility] IMAGING Released By: Loreli Dollar Authorizing: Cammie Sickle, MD  Exam Information   Status Exam Begun  Exam Ended   Final [99] 02/27/2017 8:14 AM 02/27/2017 8:42 AM  PACS Images   Show images for CT ABDOMEN PELVIS W CONTRAST  Study Result   CLINICAL DATA:  57 year old female with history of left  lower quadrant discomfort. Constipation. Worsening symptoms over the past 1-1/2 weeks. Additional history of ovarian cancer status post total abdominal hysterectomy and bilateral salpingo-oophorectomy in 2016 followed by chemotherapy.  EXAM: CT ABDOMEN AND PELVIS WITH CONTRAST  TECHNIQUE: Multidetector CT imaging of the abdomen and pelvis was performed using the standard protocol following bolus administration of intravenous contrast.  CONTRAST:  153mL ISOVUE-300 IOPAMIDOL (ISOVUE-300) INJECTION 61%  COMPARISON:  CT the abdomen and pelvis 12/09/2015.  FINDINGS: Lower chest: Unremarkable.  Hepatobiliary: No cystic or solid hepatic lesions. No intra or extrahepatic biliary ductal dilatation. Status post cholecystectomy.  Pancreas: No pancreatic mass. No pancreatic ductal dilatation. No pancreatic or peripancreatic fluid or inflammatory changes.  Spleen: Unremarkable.  Adrenals/Urinary Tract: Bilateral kidneys and bilateral adrenal glands are normal in appearance. No hydroureteronephrosis. Urinary bladder is normal in appearance.  Stomach/Bowel: Normal appearance of the stomach. No pathologic dilatation of small bowel or colon. Several colonic diverticulae are noted most evident in the sigmoid colon, without surrounding inflammatory changes to suggest an acute diverticulitis at this time. Stool burden is low despite the reported history of constipation. Normal appendix.  Vascular/Lymphatic: No significant atherosclerotic disease, and no aneurysm or dissection in the abdominal or pelvic vasculature. No lymphadenopathy noted in the abdomen or pelvis.  Reproductive: Status post total abdominal hysterectomy  and bilateral salpingo-oophorectomy. No unexpected soft tissue mass noted in the low anatomic pelvis to suggest locally recurrent disease.  Other: No significant volume of ascites. No pneumoperitoneum. No definite soft tissue nodularity noted in the  peritoneal cavity.  Musculoskeletal: There are no aggressive appearing lytic or blastic lesions noted in the visualized portions of the skeleton.  IMPRESSION: 1. No acute findings are noted in the abdomen or pelvis to account for the patient's symptoms. Notably, there is a low stool burden despite the patient's reported history of constipation. 2. Colonic diverticulosis without evidence of acute diverticulitis at this time. 3. Additional incidental findings, as above.   Electronically Signed   By: Vinnie Langton M.D.   On: 02/27/2017 08:56   Looked good in November    Patient Active Problem List   Diagnosis Date Noted  . Sensation of pressure in bladder area 08/02/2017  . Bloating 08/02/2017  . Menopause 09/14/2016  . Neoplasm, ovary, malignant, unspecified laterality ([place]) 12/09/2015  . Sinus pressure 08/17/2015  . Left flank pain 06/22/2015  . Abdominal tenderness of left lower quadrant 06/22/2015  . Lower abdominal pain 11/26/2014  . Status post laparoscopic hysterectomy 10/29/2014  . Allergic conjunctivitis and rhinitis 07/04/2012  . GERD (gastroesophageal reflux disease) 05/01/2007  . PSORIASIS NEC 12/07/2006   Past Medical History:  Diagnosis Date  . Anxiety   . Arthritis   . Benign fundic gland polyps of stomach   . Dizziness    vertigo  . Gallstones   . GERD (gastroesophageal reflux disease)   . Ovarian cancer ([place]) 2016   Past Surgical History:  Procedure Laterality Date  . CESAREAN SECTION    . CHOLECYSTECTOMY    . COLONOSCOPY    . LAPAROSCOPIC HYSTERECTOMY N/A 10/29/2014   Procedure: HYSTERECTOMY TOTAL LAPAROSCOPIC, OMENTUMECTOMY;  Surgeon: Gillis Ends, MD;  Location: [facility] ORS;  Service: Gynecology;  Laterality: N/A;  . LAPAROSCOPIC OOPHERECTOMY    . OOPHORECTOMY    . UPPER GI ENDOSCOPY     Social History   Tobacco Use  . Smoking status: Former Smoker    Last attempt to quit: 04/05/1991    Years since quitting: 26.3  . Smokeless  tobacco: Never Used  Substance Use Topics  . Alcohol use: Yes    Alcohol/week: 0.0 oz    Comment: 1 drink of wine daily  . Drug use: No   Family History  Problem Relation Age of Onset  . Hypertension Mother        31yo   . Hyperlipidemia Mother   . GER disease Mother   . Heart disease Father        49yo  . Heart disease Paternal Grandmother   . Breast cancer Paternal Grandmother 48       deceased 65  . Kidney disease Neg Hx   . Liver disease Neg Hx   . Colon cancer Neg Hx   . Colon polyps Neg Hx   . Esophageal cancer Neg Hx   . Pancreatic cancer Neg Hx    Allergies  Allergen Reactions  . Ciprofloxacin Anaphylaxis  . Paclitaxel Shortness Of Breath and Other (See Comments)    Other reaction(s): Tight chest (finding) Chest and facial flushing Flushing, chest tightness, SOB w/ Taxol on 05/26/14 at [place].  . Flagyl [Metronidazole] Other (See Comments)    headache   Current Outpatient Medications on File Prior to Visit  Medication Sig Dispense Refill  . acetaminophen (TYLENOL) 500 MG tablet Take 2 tablets (1,000 mg total)  by mouth every 6 (six) hours. (Patient taking differently: Take 1,000 mg by mouth every 6 (six) hours as needed for mild pain, moderate pain, fever or headache. ) 30 tablet 0   Current Facility-Administered Medications on File Prior to Visit  Medication Dose Route Frequency Provider Last Rate Last Dose  . sodium chloride 0.9 % injection 10 mL  10 mL Intravenous PRN Forest Gleason, MD   10 mL at 11/19/14 1628  . sodium chloride flush (NS) 0.9 % injection 10 mL  10 mL Intravenous PRN Cammie Sickle, MD   10 mL at 10/28/15 1135  . sodium chloride flush (NS) 0.9 % injection 10 mL  10 mL Intravenous PRN Cammie Sickle, MD   10 mL at 10/28/16 1604  . sodium chloride flush (NS) 0.9 % injection 10 mL  10 mL Intravenous PRN Cammie Sickle, MD   10 mL at 03/20/17 0831    Review of Systems  Constitutional: Negative for activity change,  appetite change, fatigue, fever and unexpected weight change.  HENT: Negative for congestion, ear pain, rhinorrhea, sinus pressure and sore throat.   Eyes: Negative for pain, redness and visual disturbance.  Respiratory: Negative for cough, shortness of breath and wheezing.   Cardiovascular: Negative for chest pain and palpitations.  Gastrointestinal: Positive for abdominal distention and abdominal pain. Negative for anal bleeding, blood in stool, constipation, diarrhea, nausea, rectal pain and vomiting.       Bloating and gas discomfort   Endocrine: Negative for polydipsia and polyuria.  Genitourinary: Negative for dysuria, frequency and urgency.  Musculoskeletal: Negative for arthralgias, back pain and myalgias.  Skin: Negative for pallor and rash.  Allergic/Immunologic: Negative for environmental allergies.  Neurological: Negative for dizziness, syncope and headaches.  Hematological: Negative for adenopathy. Does not bruise/bleed easily.  Psychiatric/Behavioral: Negative for decreased concentration and dysphoric mood. The patient is not nervous/anxious.        Objective:   Physical Exam  Constitutional: She appears well-developed and well-nourished. No distress.  Well appearing   HENT:  Head: Normocephalic and atraumatic.  Mouth/Throat: Oropharynx is clear and moist.  Eyes: Pupils are equal, round, and reactive to light. Conjunctivae and EOM are normal. No scleral icterus.  Neck: Normal range of motion. Neck supple.  Cardiovascular: Normal rate, regular rhythm and normal heart sounds.  Pulmonary/Chest: Effort normal and breath sounds normal. No respiratory distress. She has no wheezes. She has no rales.  Abdominal: Soft. Bowel sounds are normal. She exhibits no distension and no mass. There is tenderness. There is no rebound and no guarding. No hernia.  Mildly tender in bilat LQs  No reboung/guarding or M noted  Nl bs    Lymphadenopathy:    She has no cervical adenopathy.   Neurological: She is alert.  Skin: Skin is warm and dry. No erythema. No pallor.  Psychiatric: She has a normal mood and affect.          Assessment & Plan:   Problem List Items Addressed This Visit      Other   Bloating    Ongoing  Reviewed GI notes  Hx of small bowel bacterial overgrowth possibly due to adhesions from past gyn surgery  Has responded to xifaxan when needed  Now mainly bloating and discomfort  Does react to wheat and lactose Celiac labs today  ua today as well as cbc  Rev CT/colonoscopy and records  Disc avoidance of swallowing air - other things to prevent gas  Relevant Orders   Celiac Pnl 2 rflx Endomysial Ab Ttr   CBC with Differential/Platelet (Completed)   Lower abdominal pain - Primary    Ongoing with pressure See a/p for bloating-related  ua today  Celiac labs and cbc today   ? Role of adhesions-may need to disc with her surgeon        Relevant Orders   Celiac Pnl 2 rflx Endomysial Ab Ttr   CBC with Differential/Platelet (Completed)   POCT Urinalysis Dipstick (Automated) (Completed)   Sensation of pressure in bladder area    Likely related to abd bloating and other symptoms Past hx of surgery for ov cancer   ua today

## 2017-08-02 NOTE — Assessment & Plan Note (Signed)
Likely related to abd bloating and other symptoms Past hx of surgery for ov cancer   ua today

## 2017-08-08 LAB — CELIAC PNL 2 RFLX ENDOMYSIAL AB TTR
(tTG) Ab, IgA: <1 U/mL
(tTG) Ab, IgG: 4 U/mL
ENDOMYSIAL AB IGA: NEGATIVE
GLIADIN(DEAM) AB,IGA: 7 U (ref <20)
Gliadin(Deam) Ab,IgG: 4 U (ref <20)
IMMUNOGLOBULIN A: 140 mg/dL (ref 81–463)

## 2017-08-17 ENCOUNTER — Inpatient Hospital Stay: Payer: BLUE CROSS/BLUE SHIELD | Attending: Internal Medicine

## 2017-08-17 DIAGNOSIS — Z95828 Presence of other vascular implants and grafts: Secondary | ICD-10-CM

## 2017-08-17 DIAGNOSIS — Z8543 Personal history of malignant neoplasm of ovary: Secondary | ICD-10-CM | POA: Diagnosis not present

## 2017-08-17 MED ORDER — SODIUM CHLORIDE 0.9% FLUSH
10.0000 mL | Freq: Once | INTRAVENOUS | Status: AC
  Administered 2017-08-17: 10 mL via INTRAVENOUS
  Filled 2017-08-17: qty 10

## 2017-08-17 MED ORDER — HEPARIN SOD (PORK) LOCK FLUSH 100 UNIT/ML IV SOLN
500.0000 [IU] | Freq: Once | INTRAVENOUS | Status: AC
  Administered 2017-08-17: 500 [IU] via INTRAVENOUS

## 2017-08-25 DIAGNOSIS — D225 Melanocytic nevi of trunk: Secondary | ICD-10-CM | POA: Diagnosis not present

## 2017-08-25 DIAGNOSIS — L821 Other seborrheic keratosis: Secondary | ICD-10-CM | POA: Diagnosis not present

## 2017-08-25 DIAGNOSIS — Z1283 Encounter for screening for malignant neoplasm of skin: Secondary | ICD-10-CM | POA: Diagnosis not present

## 2017-08-30 ENCOUNTER — Telehealth: Payer: Self-pay | Admitting: Genetic Counselor

## 2017-08-30 NOTE — Telephone Encounter (Signed)
Cancer Genetics             Telegenetics Results Disclosure   Patient Name: Debbie Richardson Patient DOB: [birth year] Patient Age: 57 y.o. Phone Call Date: 08/30/2017  Referring Provider: Charlaine Dalton, MD    Debbie Richardson was called today to discuss genetic test results. Please see the Genetics telephone note from 07/04/17 for a detailed discussion of her personal and family histories and the recommendations provided.  Genetic Testing: At the time of Debbie Richardson's telegenetics visit, she decided to pursue genetic testing of multiple genes associated with hereditary susceptibility to cancer. Testing included sequencing and deletion/duplication analysis. Testing did not reveal a pathogenic mutation in any of the genes analyzed.  A copy of the genetic test report will be scanned into Epic under the Media tab.  The genes analyzed were the 83 genes on [facility]'s Multi-Cancer panel (ALK, APC, ATM, AXIN2, BAP1, BARD1, BLM, BMPR1A, BRCA1, BRCA2, BRIP1, CASR, CDC73, CDH1, CDK4, CDKN1B, CDKN1C, CDKN2A, CEBPA, CHEK2, CTNNA1, DICER1, DIS3L2, EGFR, EPCAM, FH, FLCN, GATA2, GPC3, GREM1, HOXB13, HRAS, KIT, MAX, MEN1, MET, MITF, MLH1, MSH2, MSH3, MSH6, MUTYH, NBN, NF1, NF2, NTHL1, PALB2, PDGFRA, PHOX2B, PMS2, POLD1, POLE, POT1, PRKAR1A, PTCH1, PTEN, RAD50, RAD51C, RAD51D, RB1, RECQL4, RET, RUNX1, SDHA, SDHAF2, SDHB, SDHC, SDHD, SMAD4, SMARCA4, SMARCB1, SMARCE1, STK11, SUFU, TERC, TERT, TMEM127, TP53, TSC1, TSC2, VHL, WRN, WT1).  Since the current test is not perfect, it is possible that there may be a gene mutation that current testing cannot detect, but that chance is small. It is possible that a different genetic factor, which has not yet been discovered or is not on this panel, is responsible for the cancer diagnoses in the family. Again, the likelihood of this is low. No additional testing is recommended at this time for Debbie Richardson.  A Variant of Uncertain Significance was  detected: SDHA c.155C>T (p.Ser52Phe). This is still considered a normal result. While at this time, it is unknown if this finding is associated with increased cancer risk, the majority of these variants get reclassified to be inconsequential. Medical management should not be based on this finding. With time, we suspect the lab will determine the significance, if any. If we do learn more about it, we will try to contact Debbie Richardson to discuss it further. It is important to stay in touch with [place] periodically and keep the address and phone number up to date.  Cancer Screening: These results suggest that Debbie Richardson's cancer was most likely not due to an inherited predisposition. Most cancers happen by chance and this test, along with details of her family history, suggests that her cancer falls into this category. She is recommended to follow the cancer screening guidelines provided by her physician.   Family Members: Family members are at some increased risk of developing cancer, over the general population risk, simply due to the family history. Women are recommended to have a yearly mammogram beginning at age 50, a yearly clinical breast exam, a yearly gynecologic exam and perform monthly breast self-exams. Colon cancer screening is recommended to begin by age 78 in both men and women, unless there is a family history of colon cancer or colon polyps or an individual has a personal history to warrant initiating screening at a younger age.  Any relative who had cancer at a young age or had a particularly rare cancer may also wish  to pursue genetic testing. Genetic counselors can be located in other cities, by visiting the website of the [employer] ([employer]ist[employer]se) and Field seismologist for a Dietitian by zip code.   Family members are not recommended to get tested for the above VUS outside of a research protocol as this finding has no implications for their medical management.      Lastly, cancer genetics is a rapidly advancing field and it is possible that new genetic tests will be appropriate for Debbie Richardson in the future. Debbie Richardson is encouraged to remain in contact with Genetics on an annual basis so we can update her personal and family histories, and let her know of advances in cancer genetics that may benefit the family. Debbie Richardson's questions were answered to her satisfaction today, and she knows she is welcome to call anytime with additional questions.    Steele Berg, MS, [place] Certified Genetic Counselor phone: [phone]

## 2017-09-13 ENCOUNTER — Inpatient Hospital Stay (HOSPITAL_BASED_OUTPATIENT_CLINIC_OR_DEPARTMENT_OTHER): Payer: BLUE CROSS/BLUE SHIELD | Admitting: Obstetrics and Gynecology

## 2017-09-13 ENCOUNTER — Other Ambulatory Visit: Payer: Self-pay | Admitting: Nurse Practitioner

## 2017-09-13 ENCOUNTER — Inpatient Hospital Stay: Payer: BLUE CROSS/BLUE SHIELD | Attending: Obstetrics and Gynecology

## 2017-09-13 VITALS — BP 142/80 | HR 87 | Temp 98.2°F | Resp 18 | Ht 62.0 in | Wt 130.0 lb

## 2017-09-13 DIAGNOSIS — Z8543 Personal history of malignant neoplasm of ovary: Secondary | ICD-10-CM | POA: Diagnosis not present

## 2017-09-13 DIAGNOSIS — Z9221 Personal history of antineoplastic chemotherapy: Secondary | ICD-10-CM | POA: Insufficient documentation

## 2017-09-13 DIAGNOSIS — C569 Malignant neoplasm of unspecified ovary: Secondary | ICD-10-CM

## 2017-09-13 DIAGNOSIS — Z90722 Acquired absence of ovaries, bilateral: Secondary | ICD-10-CM | POA: Insufficient documentation

## 2017-09-13 DIAGNOSIS — R109 Unspecified abdominal pain: Secondary | ICD-10-CM | POA: Diagnosis not present

## 2017-09-13 DIAGNOSIS — Z90710 Acquired absence of both cervix and uterus: Secondary | ICD-10-CM | POA: Insufficient documentation

## 2017-09-13 NOTE — Progress Notes (Signed)
Gynecologic Oncology Interval Note  Referring Provider: Dr. Forest Gleason, Dr. 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 She is on control arm of GOG225 and completed the active portion of the study on 03/15/17.   She saw Dr. Rogue Bussing on 06/14/2017 with a negative exam.   CA 125 06/14/2017 11.4  Results pending today  She has complained of persistent abdominal bloating and pain and was seen by Dr. Vicente Males (GI) for follow-up. 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 Dr. Bailey Mech felt was likely adhesions from surgery had contributed to bacterial overgrowth. She previously was treated with doxorubicin, but Dr. Bailey Mech changed her antibibiotic to Xifaxan with complete resolution of symptoms.   Today she reports increasing pain in pelvis and back that has increased in frequency. She continues to have fluctuating abdominal pain and bloating.    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 See prior notes for complete details. She was taken emergently to the operating room on 04/28/2014 by Dr. Glennon Mac and a laparoscopic procedure was performed.  Bilateral salpingo-oophorectomy with  placement of the cystic lesions in the Endo Catch bag and removal through the LLQ port. Per the operative note the right ovary did rupture and contents of the cysts were spilled into the pelvis.  The final pathology revealed high-grade serous adenocarcinoma involving both ovaries and fallopian tubes. Washings also revealed clusters of highly atypical glandular cells and surface involvement of the ovary was present. Staging was felt  to be stage II at least high grade serous ovarian cancer. Preoperative CA125 was 45.   She received 6 cycles of carboplatin/taxane chemotherapy via an IV port, last treatment June 2016.  She had an allergy to taxol and was switched to taxotere for the last 4 cycles. She underwent interval TLH/omentectomy on 10/29/2014 for ovarian cancer. Her final pathology was negative. She enrolled on GOG225.   CT scan 01/09/2015 negative  CA125 results postchemotherapy  02/11/2015 10.4 03/12/2015 10.7 06/10/2015 10.0 03/15/2016  10.1 06/09/2016 9.9 09/14/2016 10.9 12/14/2016 9.8 02/17/17 12.1 03/20/2017 9.9  CT scan 06/24/2015 IMPRESSION: No evidence of urolithiasis, hydronephrosis, or other acute Findings. Colonic diverticulosis. No radiographic evidence of diverticulitis.  CT scan of abdomen and pelvis 12/09/2015 IMPRESSION: Status post hysterectomy and bilateral salpingo-oophorectomy. No evidence of recurrent or metastatic disease  PET scan 09/19/2016 IMPRESSION: Negative PET-CT.  No findings for hypermetabolic recurrent tumor.  CT scan of abdomen and pelvis 02/27/2017  IMPRESSION: 1. No acute findings are noted in the abdomen or pelvis to account for the patient's symptoms. Notably, there is a low stool burden despite the patient's reported history of constipation. 2. Colonic diverticulosis without evidence of acute diverticulitis at this time. 3. Additional incidental findings, as above.   She had menopausal symptoms and we discussed Effexor therapy, but she did not pursue. She has used magnesium and felt that improved her symptoms.   Genetic testing- [employer]itae 83 gene Multi-Cancer Panel was negative for pathogenic mutation. She did have a VUS: SDHA c. 155C>T.   Screening mammogram on 08/05/2016 negative ordered by Dr. Rogue Bussing   Problem List: Patient Active Problem List   Diagnosis Date Noted  . Sensation of pressure in bladder area 08/02/2017  .  Bloating 08/02/2017  . Menopause  09/14/2016  . Neoplasm, ovary, malignant, unspecified laterality ([place]) 12/09/2015  . Sinus pressure 08/17/2015  . Left flank pain 06/22/2015  . Abdominal tenderness of left lower quadrant 06/22/2015  . Lower abdominal pain 11/26/2014  . Status post laparoscopic hysterectomy 10/29/2014  . Allergic conjunctivitis and rhinitis 07/04/2012  . GERD (gastroesophageal reflux disease) 05/01/2007  . PSORIASIS NEC 12/07/2006    Past Medical History: Past Medical History:  Diagnosis Date  . Anxiety   . Arthritis   . Benign fundic gland polyps of stomach   . Dizziness    vertigo  . Gallstones   . GERD (gastroesophageal reflux disease)   . Ovarian cancer ([place]) 2016    Past Surgical History: Past Surgical History:  Procedure Laterality Date  . CESAREAN SECTION    . CHOLECYSTECTOMY    . COLONOSCOPY    . LAPAROSCOPIC HYSTERECTOMY N/A 10/29/2014   Procedure: HYSTERECTOMY TOTAL LAPAROSCOPIC, OMENTUMECTOMY;  Surgeon: Gillis Ends, MD;  Location: [facility] ORS;  Service: Gynecology;  Laterality: N/A;  . LAPAROSCOPIC OOPHERECTOMY    . OOPHORECTOMY    . UPPER GI ENDOSCOPY      Family History: Family History  Problem Relation Age of Onset  . Hypertension Mother        38yo   . Hyperlipidemia Mother   . GER disease Mother   . Heart disease Father        47yo  . Breast cancer Sister   . Heart disease Paternal Grandmother   . Breast cancer Paternal Grandmother 3       deceased 26  . Kidney disease Neg Hx   . Liver disease Neg Hx   . Colon cancer Neg Hx   . Colon polyps Neg Hx   . Esophageal cancer Neg Hx   . Pancreatic cancer Neg Hx     Social History: Social History   Socioeconomic History  . Marital status: Married    Spouse name: Not on file  . Number of children: 1  . Years of education: Not on file  . Highest education level: Not on file  Occupational History  . Not on file  Social Needs  . Financial resource strain: Not on file  . Food insecurity:    Worry:  Not on file    Inability: Not on file  . Transportation needs:    Medical: Not on file    Non-medical: Not on file  Tobacco Use  . Smoking status: Former Smoker    Last attempt to quit: 04/05/1991    Years since quitting: 26.4  . Smokeless tobacco: Never Used  Substance and Sexual Activity  . Alcohol use: Yes    Alcohol/week: 0.0 oz    Comment: 1 drink of wine daily  . Drug use: No  . Sexual activity: Yes  Lifestyle  . Physical activity:    Days per week: Not on file    Minutes per session: Not on file  . Stress: Not on file  Relationships  . Social connections:    Talks on phone: Not on file    Gets together: Not on file    Attends religious service: Not on file    Active member of club or organization: Not on file    Attends meetings of clubs or organizations: Not on file    Relationship status: Not on file  . Intimate partner violence:    Fear of current or ex partner: Not on file    Emotionally abused:  Not on file    Physically abused: Not on file    Forced sexual activity: Not on file  Other Topics Concern  . Not on file  Social History Narrative  . Not on file    Allergies: Allergies  Allergen Reactions  . Ciprofloxacin Anaphylaxis  . Paclitaxel Shortness Of Breath and Other (See Comments)    Other reaction(s): Tight chest (finding) Chest and facial flushing Flushing, chest tightness, SOB w/ Taxol on 05/26/14 at [place].  . Flagyl [Metronidazole] Other (See Comments)    headache    Current Medications: Current Outpatient Medications  Medication Sig Dispense Refill  . acetaminophen (TYLENOL) 500 MG tablet Take 2 tablets (1,000 mg total) by mouth every 6 (six) hours. (Patient taking differently: Take 1,000 mg by mouth every 6 (six) hours as needed for mild pain, moderate pain, fever or headache. ) 30 tablet 0   No current facility-administered medications for this visit.    Facility-Administered Medications Ordered in Other Visits  Medication Dose  Route Frequency Provider Last Rate Last Dose  . sodium chloride 0.9 % injection 10 mL  10 mL Intravenous PRN Forest Gleason, MD   10 mL at 11/19/14 1628  . sodium chloride flush (NS) 0.9 % injection 10 mL  10 mL Intravenous PRN Cammie Sickle, MD   10 mL at 10/28/15 1135  . sodium chloride flush (NS) 0.9 % injection 10 mL  10 mL Intravenous PRN Cammie Sickle, MD   10 mL at 10/28/16 1604  . sodium chloride flush (NS) 0.9 % injection 10 mL  10 mL Intravenous PRN Cammie Sickle, MD   10 mL at 03/20/17 0831      Review of Systems General: negative  HEENT: negative  Lungs: no SOB/cough  Cardiac: no complaints  GI: as per interval history  GU: h/o bladder issues; negative today  Musculoskeletal: back pain, chronic  Extremities: no complaints  Skin: no complaints  Neuro: no complaints  Endocrine: no complaints  Psych: no complaints  Allergy complaints - seasonal      Objective:   Vitals:   09/13/17 1402  BP: (!) 142/80  Pulse: 87  Resp: 18  Temp: 98.2 F (36.8 C)    Body mass index is 23.78 kg/m.  ECOG Performance Status: 0 - Asymptomatic  General appearance: alert, cooperative and appears stated age HEENT:PERRLA, extra ocular movement intact, sclera clear, anicteric CV: RRR Lungs: B CTA Lymph node survey: Negative for axillary, supraclavicular or left inguinal adenopathy. One palpate node right inguinal - previously 1.5 cm nontender and mobile, stable today Abdomen: soft, nontender and nondistended. No hernias, masses, infection or ascites and incisions well healed.  Back: tender over the left paraspinus muscle, possible left CVAT - no right CVAT, no tenderness over spine Extremities: extremities normal, atraumatic, no cyanosis or edema Neurological exam reveals alert, oriented, normal speech, no focal findings or movement disorder noted.  Pelvic: exam chaperoned by nurse;  Vulva: normal appearing vulva with no masses, tenderness or lesions; Vagina:  normal vagina and cuff healed; BME: nontender and no masses; Uterus, Cervix: surgically absent. Rectal: deferred   Assessment:  Debbie Richardson is a 57 y.o. female with a history of stage II at least high grade serous ovarian cancer. Status post LS BSO and 6 cycles of platin/taxane chemotherapy.  No evidence of disease on CT or exam and CA125 normal in March 2019. Results pending today.  Interval TLH/Omentectomy 7/16 negative for malignancy. Persistent and slightly worsening abdominal pain,  CT scan negative for metastatic disease, diverticular disease present. Persistent pain of uncertain etiology - GI diagnosis is bacterial overgrowth and adhesive disease. Prior CT scans and PET scan all negative for disease.   Left CVAT/Back pain, DDX musculoskeletal vs intraperitoneal or retroperitoneal abnormality.   Menopausal symptoms, controlled.   Multiple medical complaints and issues.    Plan:   Problem List Items Addressed This Visit      Genitourinary   Neoplasm, ovary, malignant, unspecified laterality ([place]) - Primary    Other Visit Diagnoses    Malignant neoplasm of ovary, unspecified laterality ([place])       Relevant Orders   CA 125      Obtain CT scan A/P to evaluate left CVAT/back pain. If negative recommended follow up with PCP and consider PT as her symptoms may represent musculoskeletal etiology  We will follow up CA125 today.   [id number] study completed 03/15/2017. She will continue to alternate visits with [place] and Dr. Rogue Bussing every 3 months through 03/2019 then every 6 months as long as imaging is negative.   I personally reviewed the patient's history, completed key elements of her exam, and was involved in decision making in conjunction with Ms. Beckey Rutter.   Beckey Rutter, DNP, AGNP-C [place] [facility] [phone] (work cell) [phone] (office) 09/13/17 2:24 PM  I personally had a face to face interaction and evaluated the patient jointly with  the NP, Ms. Beckey Rutter.  I have reviewed her history and available records and have performed the key portions of the physical exam including ymph node survey, abdominal exam, pelvic exam with my findings confirming those documented above by the APP.  I have discussed the case with the APP and the patient.  I agree with the above documentation, assessment and plan which was fully formulated by me.  Counseling was completed by me.   I personally saw the patient and performed a substantive portion of this encounter in conjunction with the listed APP as documented above.  Debbie Lair Gaetana Michaelis, MD  CC:  Dr. Prentice Docker Dr. Blain Pais

## 2017-09-13 NOTE — Progress Notes (Signed)
Pt complains of left pelvic pain more constant now and to the back. Sometimes it starts in back an radiates to pelvic. It happens sometimes when she sleeps and mostly in am when first gets up and uses rice pack and it works and sometimes uses tylenol with relief. No gyn dryness, itchin, or drainage

## 2017-09-14 ENCOUNTER — Telehealth: Payer: Self-pay

## 2017-09-14 LAB — CA 125: CANCER ANTIGEN (CA) 125: 9.5 U/mL (ref 0.0–38.1)

## 2017-09-14 NOTE — Telephone Encounter (Signed)
Called to notify of normal CA 125 results. No name identified voicemail. Message left to return call. Results will also release in a few days to my chart.  Results for JEYDI, KLINGEL (MRN [id number]) as of 09/14/2017 10:52  Ref. Range 09/13/2017 13:47  Cancer Antigen (CA) 125 Latest Ref Range: 0.0 - 38.1 U/mL 9.5     Oncology Nurse Navigator Documentation  Navigator Location: [facility] (09/14/17 1000)   )Navigator Encounter Type: Telephone;Diagnostic Results (09/14/17 1000)                                                    Time Spent with Patient: 15 (09/14/17 1000)

## 2017-09-14 NOTE — Telephone Encounter (Signed)
Notified of CA 125 results. CT scan pending scheduling.   Results for NESA, DISTEL (MRN [id number]) as of 09/14/2017 11:23  Ref. Range 09/13/2017 13:47  Cancer Antigen (CA) 125 Latest Ref Range: 0.0 - 38.1 U/mL 9.5    Oncology Nurse Navigator Documentation  Navigator Location: [facility] (09/14/17 1100)   )Navigator Encounter Type: Telephone (09/14/17 1100)                                                    Time Spent with Patient: 15 (09/14/17 1100)

## 2017-09-22 ENCOUNTER — Ambulatory Visit
Admission: RE | Admit: 2017-09-22 | Discharge: 2017-09-22 | Disposition: A | Discharge status: Home / Self Care | Payer: BLUE CROSS/BLUE SHIELD | Source: Ambulatory Visit | Attending: Nurse Practitioner | Admitting: Nurse Practitioner

## 2017-09-22 DIAGNOSIS — M546 Pain in thoracic spine: Secondary | ICD-10-CM | POA: Insufficient documentation

## 2017-09-22 DIAGNOSIS — C569 Malignant neoplasm of unspecified ovary: Secondary | ICD-10-CM | POA: Diagnosis not present

## 2017-09-22 DIAGNOSIS — K5730 Diverticulosis of large intestine without perforation or abscess without bleeding: Secondary | ICD-10-CM | POA: Diagnosis not present

## 2017-09-22 DIAGNOSIS — R109 Unspecified abdominal pain: Secondary | ICD-10-CM | POA: Diagnosis not present

## 2017-09-22 MED ORDER — IOPAMIDOL (ISOVUE-300) INJECTION 61%
100.0000 mL | Freq: Once | INTRAVENOUS | Status: AC | PRN
  Administered 2017-09-22: 100 mL via INTRAVENOUS

## 2017-09-25 ENCOUNTER — Telehealth: Payer: Self-pay

## 2017-09-25 NOTE — Telephone Encounter (Signed)
Called and notified of CT results. As noted, she was encouraged to follow up with her PCP as this may be musculoskeletal.   IMPRESSION: 1. Uterus and ovaries absent. No pelvic mass. No adenopathy. No a mental lesions. No liver lesions evident.  2. No appreciable bowel wall thickening or bowel obstruction. There are occasional sigmoid diverticula without diverticulitis. No abscess in the abdomen or pelvis. Appendix appears normal.  3. No evident renal or ureteral calculus on either side. No hydronephrosis.  4.  Gallbladder absent.  5. A cause for patient's symptoms has not been established with this study.    Oncology Nurse Navigator Documentation    Navigator Location: [facility] (09/25/17 1500)   )Navigator Encounter Type: Telephone;Diagnostic Results (09/25/17 1500) Telephone: Outgoing Call;Diagnostic Results (09/25/17 1500)                                                  Time Spent with Patient: 15 (09/25/17 1500)

## 2017-10-18 ENCOUNTER — Inpatient Hospital Stay: Payer: BLUE CROSS/BLUE SHIELD | Attending: Internal Medicine

## 2017-10-18 DIAGNOSIS — Z8542 Personal history of malignant neoplasm of other parts of uterus: Secondary | ICD-10-CM | POA: Insufficient documentation

## 2017-10-18 DIAGNOSIS — Z452 Encounter for adjustment and management of vascular access device: Secondary | ICD-10-CM | POA: Insufficient documentation

## 2017-10-18 DIAGNOSIS — C569 Malignant neoplasm of unspecified ovary: Secondary | ICD-10-CM

## 2017-10-18 MED ORDER — HEPARIN SOD (PORK) LOCK FLUSH 100 UNIT/ML IV SOLN
500.0000 [IU] | Freq: Once | INTRAVENOUS | Status: AC
  Administered 2017-10-18: 500 [IU] via INTRAVENOUS

## 2017-10-18 MED ORDER — SODIUM CHLORIDE 0.9% FLUSH
10.0000 mL | Freq: Once | INTRAVENOUS | Status: AC
  Administered 2017-10-18: 10 mL via INTRAVENOUS
  Filled 2017-10-18: qty 10

## 2017-11-20 ENCOUNTER — Other Ambulatory Visit: Payer: Self-pay | Admitting: Internal Medicine

## 2017-11-20 DIAGNOSIS — Z1231 Encounter for screening mammogram for malignant neoplasm of breast: Secondary | ICD-10-CM

## 2017-12-06 ENCOUNTER — Encounter: Payer: Self-pay | Admitting: Oncology

## 2017-12-13 ENCOUNTER — Inpatient Hospital Stay: Payer: BLUE CROSS/BLUE SHIELD | Attending: Internal Medicine

## 2017-12-13 ENCOUNTER — Ambulatory Visit
Admission: RE | Admit: 2017-12-13 | Discharge: 2017-12-13 | Disposition: A | Discharge status: Home / Self Care | Payer: BLUE CROSS/BLUE SHIELD | Source: Ambulatory Visit | Attending: Internal Medicine | Admitting: Internal Medicine

## 2017-12-13 ENCOUNTER — Other Ambulatory Visit: Payer: Self-pay

## 2017-12-13 ENCOUNTER — Inpatient Hospital Stay (HOSPITAL_BASED_OUTPATIENT_CLINIC_OR_DEPARTMENT_OTHER): Payer: BLUE CROSS/BLUE SHIELD | Admitting: Internal Medicine

## 2017-12-13 ENCOUNTER — Encounter: Payer: Self-pay | Admitting: Internal Medicine

## 2017-12-13 VITALS — BP 119/73 | HR 84 | Temp 97.6°F | Resp 18 | Ht 62.0 in | Wt 127.0 lb

## 2017-12-13 DIAGNOSIS — Z1231 Encounter for screening mammogram for malignant neoplasm of breast: Secondary | ICD-10-CM | POA: Insufficient documentation

## 2017-12-13 DIAGNOSIS — C569 Malignant neoplasm of unspecified ovary: Secondary | ICD-10-CM

## 2017-12-13 DIAGNOSIS — Z800 Family history of malignant neoplasm of digestive organs: Secondary | ICD-10-CM

## 2017-12-13 DIAGNOSIS — Z8543 Personal history of malignant neoplasm of ovary: Secondary | ICD-10-CM

## 2017-12-13 DIAGNOSIS — R1032 Left lower quadrant pain: Secondary | ICD-10-CM | POA: Insufficient documentation

## 2017-12-13 DIAGNOSIS — Z9221 Personal history of antineoplastic chemotherapy: Secondary | ICD-10-CM | POA: Diagnosis not present

## 2017-12-13 DIAGNOSIS — Z87891 Personal history of nicotine dependence: Secondary | ICD-10-CM | POA: Diagnosis not present

## 2017-12-13 HISTORY — DX: Personal history of antineoplastic chemotherapy: Z92.21

## 2017-12-13 LAB — CBC WITH DIFFERENTIAL/PLATELET
BASOS ABS: 0 10*3/uL (ref 0–0.1)
BASOS PCT: 0 %
Eosinophils Absolute: 0 10*3/uL (ref 0–0.7)
Eosinophils Relative: 1 %
HCT: 38 % (ref 35.0–47.0)
Hemoglobin: 13.2 g/dL (ref 12.0–16.0)
Lymphocytes Relative: 30 %
Lymphs Abs: 1.3 10*3/uL (ref 1.0–3.6)
MCH: 32.8 pg (ref 26.0–34.0)
MCHC: 34.8 g/dL (ref 32.0–36.0)
MCV: 94.2 fL (ref 80.0–100.0)
MONO ABS: 0.3 10*3/uL (ref 0.2–0.9)
Monocytes Relative: 7 %
Neutro Abs: 2.8 10*3/uL (ref 1.4–6.5)
Neutrophils Relative %: 62 %
Platelets: 195 10*3/uL (ref 150–440)
RBC: 4.03 MIL/uL (ref 3.80–5.20)
RDW: 13 % (ref 11.5–14.5)
WBC: 4.4 10*3/uL (ref 3.6–11.0)

## 2017-12-13 LAB — COMPREHENSIVE METABOLIC PANEL
ALBUMIN: 4.7 g/dL (ref 3.5–5.0)
ALK PHOS: 45 U/L (ref 38–126)
ALT: 18 U/L (ref 0–44)
AST: 24 U/L (ref 15–41)
Anion gap: 8 (ref 5–15)
BILIRUBIN TOTAL: 1 mg/dL (ref 0.3–1.2)
BUN: 11 mg/dL (ref 6–20)
CALCIUM: 9.1 mg/dL (ref 8.9–10.3)
CO2: 25 mmol/L (ref 22–32)
Chloride: 107 mmol/L (ref 98–111)
Creatinine, Ser: 0.61 mg/dL (ref 0.44–1.00)
GFR calc Af Amer: >60 mL/min (ref >60)
GFR calc non Af Amer: >60 mL/min (ref >60)
GLUCOSE: 108 mg/dL — AB (ref 70–99)
Potassium: 3.8 mmol/L (ref 3.5–5.1)
Sodium: 140 mmol/L (ref 135–145)
TOTAL PROTEIN: 7 g/dL (ref 6.5–8.1)

## 2017-12-13 MED ORDER — HEPARIN SOD (PORK) LOCK FLUSH 100 UNIT/ML IV SOLN
500.0000 [IU] | Freq: Once | INTRAVENOUS | Status: AC
  Administered 2017-12-13: 500 [IU] via INTRAVENOUS

## 2017-12-13 MED ORDER — SODIUM CHLORIDE 0.9% FLUSH
10.0000 mL | Freq: Once | INTRAVENOUS | Status: AC
  Administered 2017-12-13: 10 mL via INTRAVENOUS
  Filled 2017-12-13: qty 10

## 2017-12-13 NOTE — Progress Notes (Signed)
[place] OFFICE PROGRESS NOTE  Patient Care Team: Tower, [name], MD as PCP - General [name], RN as Registered Nurse  Cancer Staging No matching staging information was found for the patient.    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  Finished total 6 cycles of chemotherapy with carboplatinum and Taxotere June of 2016  # Patient underwent exploratory laparotomy bilateral oophorectomy and omentectomy in July of 2016 NO evidence of malignancy was found.   # GOG 0225 [diet/excerise- on control arm]  # chronic abdominal discomfort- ? Bacterial overgrowth syndrome [dr.Anna]   # Severe reaction to Taxol; also post de-sensitization [hives]  # Genetic testing- NEG for BRCA 1&2; 83 genes on [facility]'s Multi-Cancer panel- Ofri-NEG  --------------------------------------------------------   .DIAGNOSIS: OVARIAN CA  STAGE: II        ;GOALS: cure  CURRENT/MOST RECENT THERAPY: surveiliance      Neoplasm, ovary, malignant, unspecified laterality ([place])      INTERVAL HISTORY:  Debbie Richardson 57 y.o.  female pleasant patient above history of Stage II ovarian cancer currently on surveillance is here for follow-up.  Patient continues to have intermittent abdominal pain left lower quadrant.  She had recent CT scan negative for any recurrence.  She had recent follow-up with gynecology oncology.  Denies any diarrhea constipation.  Review of Systems  Constitutional: Negative for chills, diaphoresis, fever, malaise/fatigue and weight loss.  HENT: Negative for nosebleeds and sore throat.   Eyes: Negative for double vision.  Respiratory: Negative for cough, hemoptysis, sputum production, shortness of breath and wheezing.   Cardiovascular: Negative for chest pain, palpitations, orthopnea and leg swelling.  Gastrointestinal: Positive for abdominal pain (Chronic intermittent  left lower quadrant abdominal pain.). Negative for blood in stool, constipation, diarrhea, heartburn, melena, nausea and vomiting.  Genitourinary: Negative for dysuria, frequency and urgency.  Musculoskeletal: Negative for back pain and joint pain.  Skin: Negative.  Negative for itching and rash.  Neurological: Negative for dizziness, tingling, focal weakness, weakness and headaches.  Endo/Heme/Allergies: Does not bruise/bleed easily.  Psychiatric/Behavioral: Negative for depression. The patient is not nervous/anxious and does not have insomnia.      PAST MEDICAL HISTORY :  Past Medical History:  Diagnosis Date  . Anxiety   . Arthritis   . Benign fundic gland polyps of stomach   . Dizziness    vertigo  . Gallstones   . GERD (gastroesophageal reflux disease)   . Ovarian cancer ([place]) 2016  . Personal history of chemotherapy     PAST SURGICAL HISTORY :   Past Surgical History:  Procedure Laterality Date  . CESAREAN SECTION    . CHOLECYSTECTOMY    . COLONOSCOPY    . LAPAROSCOPIC HYSTERECTOMY N/A 10/29/2014   Procedure: HYSTERECTOMY TOTAL LAPAROSCOPIC, OMENTUMECTOMY;  Surgeon: Gillis Ends, MD;  Location: [facility] ORS;  Service: Gynecology;  Laterality: N/A;  . LAPAROSCOPIC OOPHERECTOMY    . OOPHORECTOMY    . UPPER GI ENDOSCOPY      FAMILY HISTORY :   Family History  Problem Relation Age of Onset  . Hypertension Mother        7yo   . Hyperlipidemia Mother   . GER disease Mother   . Heart disease Father        5yo  . Breast cancer Sister 79  . Heart disease Paternal Grandmother   . Breast cancer Paternal Grandmother 75  deceased 2  . Kidney disease Neg Hx   . Liver disease Neg Hx   . Colon cancer Neg Hx   . Colon polyps Neg Hx   . Esophageal cancer Neg Hx   . Pancreatic cancer Neg Hx     SOCIAL HISTORY:   Social History   Tobacco Use  . Smoking status: Former Smoker    Last attempt to quit: 04/05/1991    Years since quitting: 26.7  . Smokeless  tobacco: Never Used  Substance Use Topics  . Alcohol use: Yes    Alcohol/week: 0.0 standard drinks    Comment: 1 drink of wine daily  . Drug use: No    ALLERGIES:  is allergic to ciprofloxacin; paclitaxel; and flagyl [metronidazole].  MEDICATIONS:  Current Outpatient Medications  Medication Sig Dispense Refill  . acetaminophen (TYLENOL) 500 MG tablet Take 2 tablets (1,000 mg total) by mouth every 6 (six) hours. (Patient not taking: Reported on 12/13/2017) 30 tablet 0   No current facility-administered medications for this visit.    Facility-Administered Medications Ordered in Other Visits  Medication Dose Route Frequency Provider Last Rate Last Dose  . sodium chloride 0.9 % injection 10 mL  10 mL Intravenous PRN Forest Gleason, MD   10 mL at 11/19/14 1628  . sodium chloride flush (NS) 0.9 % injection 10 mL  10 mL Intravenous PRN Cammie Sickle, MD   10 mL at 10/28/15 1135  . sodium chloride flush (NS) 0.9 % injection 10 mL  10 mL Intravenous PRN Cammie Sickle, MD   10 mL at 03/20/17 0831    PHYSICAL EXAMINATION: ECOG PERFORMANCE STATUS: 0 - Asymptomatic  BP 119/73 (BP Location: Left Arm, Patient Position: Sitting)   Pulse 84   Temp 97.6 F (36.4 C) (Tympanic)   Resp 18   Ht 5' 2"  (1.575 m)   Wt 127 lb (57.6 kg)   LMP 03/30/2013   BMI 23.23 kg/m   Filed Weights   12/13/17 1040  Weight: 127 lb (57.6 kg)    Physical Exam  Constitutional: She is oriented to person, place, and time and well-developed, well-nourished, and in no distress.  Alone.  HENT:  Head: Normocephalic and atraumatic.  Mouth/Throat: Oropharynx is clear and moist. No oropharyngeal exudate.  Eyes: Pupils are equal, round, and reactive to light.  Neck: Normal range of motion. Neck supple.  Cardiovascular: Normal rate and regular rhythm.  Pulmonary/Chest: No respiratory distress. She has no wheezes.  Abdominal: Soft. Bowel sounds are normal. She exhibits no distension and no mass. There is  no tenderness. There is no rebound and no guarding.  Musculoskeletal: Normal range of motion. She exhibits no edema or tenderness.  Neurological: She is alert and oriented to person, place, and time.  Skin: Skin is warm.  Psychiatric: Affect normal.     LABORATORY DATA:  I have reviewed the data as listed    Component Value Date/Time   NA 140 12/13/2017 1009   NA 135 07/14/2014 0955   K 3.8 12/13/2017 1009   K 3.2 (L) 07/21/2014 1518   CL 107 12/13/2017 1009   CL 106 07/14/2014 0955   CO2 25 12/13/2017 1009   CO2 21 (L) 07/14/2014 0955   GLUCOSE 108 (H) 12/13/2017 1009   GLUCOSE 138 (H) 07/14/2014 0955   BUN 11 12/13/2017 1009   BUN 10 07/14/2014 0955   CREATININE 0.61 12/13/2017 1009   CREATININE 0.60 07/14/2014 0955   CALCIUM 9.1 12/13/2017 1009   CALCIUM 9.1 07/14/2014  0955   PROT 7.0 12/13/2017 1009   PROT 7.1 07/14/2014 0955   ALBUMIN 4.7 12/13/2017 1009   ALBUMIN 4.4 07/14/2014 0955   AST 24 12/13/2017 1009   AST 24 07/14/2014 0955   ALT 18 12/13/2017 1009   ALT 20 07/14/2014 0955   ALKPHOS 45 12/13/2017 1009   ALKPHOS 45 07/14/2014 0955   BILITOT 1.0 12/13/2017 1009   BILITOT 0.7 07/14/2014 0955   GFRNONAA >60 12/13/2017 1009   GFRNONAA >60 07/14/2014 0955   GFRAA >60 12/13/2017 1009   GFRAA >60 07/14/2014 0955    No results found for: SPEP, UPEP  Lab Results  Component Value Date   WBC 4.4 12/13/2017   NEUTROABS 2.8 12/13/2017   HGB 13.2 12/13/2017   HCT 38.0 12/13/2017   MCV 94.2 12/13/2017   PLT 195 12/13/2017      Chemistry      Component Value Date/Time   NA 140 12/13/2017 1009   NA 135 07/14/2014 0955   K 3.8 12/13/2017 1009   K 3.2 (L) 07/21/2014 1518   CL 107 12/13/2017 1009   CL 106 07/14/2014 0955   CO2 25 12/13/2017 1009   CO2 21 (L) 07/14/2014 0955   BUN 11 12/13/2017 1009   BUN 10 07/14/2014 0955   CREATININE 0.61 12/13/2017 1009   CREATININE 0.60 07/14/2014 0955      Component Value Date/Time   CALCIUM 9.1 12/13/2017 1009    CALCIUM 9.1 07/14/2014 0955   ALKPHOS 45 12/13/2017 1009   ALKPHOS 45 07/14/2014 0955   AST 24 12/13/2017 1009   AST 24 07/14/2014 0955   ALT 18 12/13/2017 1009   ALT 20 07/14/2014 0955   BILITOT 1.0 12/13/2017 1009   BILITOT 0.7 07/14/2014 0955       RADIOGRAPHIC STUDIES: I have personally reviewed the radiological images as listed and agreed with the findings in the report. Mm 3d Screen Breast Bilateral  Result Date: 12/13/2017 CLINICAL DATA:  Screening. EXAM: DIGITAL SCREENING BILATERAL MAMMOGRAM WITH TOMO AND CAD COMPARISON:  Previous exam(s). ACR Breast Density Category b: There are scattered areas of fibroglandular density. FINDINGS: There are no findings suspicious for malignancy. Images were processed with CAD. IMPRESSION: No mammographic evidence of malignancy. A result letter of this screening mammogram will be mailed directly to the patient. RECOMMENDATION: Screening mammogram in one year. (Code:SM-B-01Y) BI-RADS CATEGORY  1: Negative. Electronically Signed   By: Marin Olp M.D.   On: 12/13/2017 16:56     ASSESSMENT & PLAN:  Neoplasm, ovary, malignant, unspecified laterality ([place]) # Stage II ovarian ca- status post chemotherapy/surgery. Clinically NED;  C125-negative.  June 2019 negative for for recurrence.STABLE.   #Abdominal pain-?  Bacterial overgrowth syndrome versus others.  Recent CT scan negative for any recurrent malignancy. STABLE.   # GOG study; discussed with RN- patient on control arm.STABLE.   # Extended Genetic testing panel -NEGATIVE.   #Mediport in place [Dr.Dew]-patient interested in have the port explanted.  However, wants approval from gynecology oncology.  Patient has an appointment with Dr. [name] in December.  She wants to have it taken out if possible before the end of the year.  # follow up in 6 months/labs- ca-125.    Orders Placed This Encounter  Procedures  . CBC with Differential/Platelet    Standing Status:   Future    Standing  Expiration Date:   12/14/2018  . Comprehensive metabolic panel    Standing Status:   Future    Standing Expiration Date:  12/14/2018  . CA 125    Standing Status:   Future    Standing Expiration Date:   12/13/2018   All questions were answered. The patient knows to call the clinic with any problems, questions or concerns.      Cammie Sickle, MD 12/13/2017 5:34 PM

## 2017-12-13 NOTE — Assessment & Plan Note (Addendum)
#   Stage II ovarian ca- status post chemotherapy/surgery. Clinically NED;  C125-negative.  June 2019 negative for for recurrence.STABLE.   #Abdominal pain-?  Bacterial overgrowth syndrome versus others.  Recent CT scan negative for any recurrent malignancy. STABLE.   # GOG study; discussed with RN- patient on control arm.STABLE.   # Extended Genetic testing panel -NEGATIVE.   #Mediport in place [Dr.Dew]-patient interested in have the port explanted.  However, wants approval from gynecology oncology.  Patient has an appointment with Dr. [name] in December.  She wants to have it taken out if possible before the end of the year.  # follow up in 6 months/labs- ca-125.

## 2017-12-14 ENCOUNTER — Telehealth: Payer: Self-pay | Admitting: *Deleted

## 2017-12-14 ENCOUNTER — Telehealth: Payer: Self-pay | Admitting: Nurse Practitioner

## 2017-12-14 LAB — CA 125: Cancer Antigen (CA) 125: 8.9 U/mL (ref 0.0–38.1)

## 2017-12-14 NOTE — Telephone Encounter (Signed)
Attempted to call patient with results from recent tumor marker testing.  No answer and unable to leave voicemail.

## 2017-12-14 NOTE — Telephone Encounter (Signed)
T/C made to Debbie Richardson this morning as planned to inform her of CA-125 result - which was 8.9 U/mL. Patient states this is the lowest her CA-125 has been. She also asked about her mammogram results from her scan yesterday, and this was reported to her as BI-RADS Category 1 - negative per the report in Epic. Ms. Schubring again requests that I speak to Dr. [name] about getting her PAC removed, but states she would not be able to do this until the first of December due to her current work commitments. Will talk with Dr. [name] on patient's behalf next Wednesday when she is in [facility]. [name], BSN, MHA, OCN 12/14/2017  8:48 AM

## 2017-12-29 ENCOUNTER — Other Ambulatory Visit: Payer: Self-pay | Admitting: *Deleted

## 2017-12-29 DIAGNOSIS — Z452 Encounter for adjustment and management of vascular access device: Secondary | ICD-10-CM

## 2018-01-03 ENCOUNTER — Telehealth (INDEPENDENT_AMBULATORY_CARE_PROVIDER_SITE_OTHER): Payer: Self-pay

## 2018-01-03 ENCOUNTER — Encounter (INDEPENDENT_AMBULATORY_CARE_PROVIDER_SITE_OTHER): Payer: Self-pay

## 2018-01-03 NOTE — Telephone Encounter (Signed)
I attempted to contact the patient regarding getting scheduled to have her port removal, her voicemail box is full and I was unable to leave a message. The patient has been scheduled for 01/15/18 with Dew and the information has been mailed out to her.

## 2018-01-05 ENCOUNTER — Encounter (INDEPENDENT_AMBULATORY_CARE_PROVIDER_SITE_OTHER): Payer: Self-pay

## 2018-02-02 ENCOUNTER — Inpatient Hospital Stay: Payer: BLUE CROSS/BLUE SHIELD

## 2018-02-12 ENCOUNTER — Telehealth: Payer: Self-pay | Admitting: Gastroenterology

## 2018-02-12 NOTE — Telephone Encounter (Signed)
Pt left vm she was given samples by Dr. Vicente Males an antibiotic starts with an X pt did not know how to pronounce rx, she would like to pick up more samples

## 2018-02-15 NOTE — Telephone Encounter (Signed)
Patient would like a return call concerning the samples needed.

## 2018-02-16 NOTE — Telephone Encounter (Signed)
Spoke with pt regarding her request for medication samples. Pt is requesting more samples of Xifaxan. Pt states she was treated with this medication back in April 2019 for reoccurring symptoms. I explained that I will consult with Dr. Vicente Males and then advise.

## 2018-02-16 NOTE — Telephone Encounter (Signed)
Please give 10 days of samples . No office visit needed

## 2018-02-19 ENCOUNTER — Other Ambulatory Visit: Payer: Self-pay

## 2018-02-19 MED ORDER — AMOXICILLIN-POT CLAVULANATE 875-125 MG PO TABS: 1.0000 | ORAL_TABLET | Freq: Two times a day (BID) | ORAL | 0 refills | Status: AC

## 2018-02-19 NOTE — Telephone Encounter (Signed)
Spoke with pt and informed her of Dr. Georgeann Oppenheim instructions to try Augmentin twice daily for 7 days. I confirmed with pt that she is not allergic to penicillin derived antibiotics. I explained to pt that we have sent the Augmentin prescription to her preferred pharmacy.

## 2018-02-23 ENCOUNTER — Inpatient Hospital Stay: Payer: BLUE CROSS/BLUE SHIELD | Attending: Internal Medicine

## 2018-02-23 DIAGNOSIS — Z452 Encounter for adjustment and management of vascular access device: Secondary | ICD-10-CM | POA: Diagnosis not present

## 2018-02-23 DIAGNOSIS — Z8543 Personal history of malignant neoplasm of ovary: Secondary | ICD-10-CM | POA: Diagnosis not present

## 2018-02-23 MED ORDER — HEPARIN SOD (PORK) LOCK FLUSH 100 UNIT/ML IV SOLN
500.0000 [IU] | Freq: Once | INTRAVENOUS | Status: AC
  Administered 2018-02-23: 500 [IU] via INTRAVENOUS

## 2018-02-23 MED ORDER — SODIUM CHLORIDE 0.9% FLUSH
10.0000 mL | Freq: Once | INTRAVENOUS | Status: AC
  Administered 2018-02-23: 10 mL via INTRAVENOUS
  Filled 2018-02-23: qty 10

## 2018-02-23 MED ORDER — HEPARIN SOD (PORK) LOCK FLUSH 100 UNIT/ML IV SOLN
INTRAVENOUS | Status: AC
  Filled 2018-02-23: qty 5

## 2018-03-05 ENCOUNTER — Ambulatory Visit: Admit: 2018-03-05 | Payer: BLUE CROSS/BLUE SHIELD | Admitting: Vascular Surgery

## 2018-03-05 SURGERY — PORTA CATH REMOVAL
Anesthesia: Moderate Sedation

## 2018-03-06 ENCOUNTER — Encounter: Payer: Self-pay | Admitting: Gastroenterology

## 2018-03-06 ENCOUNTER — Ambulatory Visit: Payer: BLUE CROSS/BLUE SHIELD | Admitting: Gastroenterology

## 2018-03-06 VITALS — BP 119/81 | HR 91 | Ht 62.0 in | Wt 131.6 lb

## 2018-03-06 DIAGNOSIS — R140 Abdominal distension (gaseous): Secondary | ICD-10-CM | POA: Diagnosis not present

## 2018-03-06 NOTE — Progress Notes (Signed)
Debbie Bellows MD, MRCP(U.K) [address]  [place]  [place], [place] [address]  Main: [phone]  Fax: [phone]   Primary Care Physician: Tower, [name], MD  Primary Gastroenterologist:  Dr. Jonathon Richardson   No chief complaint on file.   HPI: Debbie Richardson is a 57 y.o. female   Summary of history : She is here for follow up. She was last seen on 12/13/17 . She has a history of abdominal pain of 1 year duration in [place], bloating .She has a history of high grade serous ovarian cancer s/p interval TLH/omentectomy and chemotherapy. Undergone BSO in 2016 . CT abdomen 12/2015- no evidence of metastatic disease.  02/27/17- Ct abdomen - diverticulosis of the colon , low stool burden.   Colonoscopy in 04/2014 showed diverticulosis and internal hemorroids. EGD 04/2014 showed - small gastric polyps(fundic gland polyps) otherwise normal.I treated her empirically with doxycycline for 2 weeks , I felt that the adhesions from surgery were likely contributing to stasis and small bowel bacterial overgrowth.Completed 2 rounds of doxycycline.Symptoms recurred in mid October- similar to the past - pain , throbbing in nature, no bloating-she called our office, started doxycycline-felt better- all of a sudden felt it stopped working . Started to watch what she was eating . Towards the end of doxycycline noticed some bloating, no pain but has pressure.CBC and CMP was normal in 12/2016.    Interval history 07/12/17 -03/06/18  Did well for 8 months , beginning of October developed gas and bloating. No artificial sugars in diet , no sodas, does not smoke. Passing a lot of gas, lot of bloating. Was prescribed augmentin - took 10 days - helped a bit but did not resolve the issue .   Presently 50% better.    Current Outpatient Medications  Medication Sig Dispense Refill  . acetaminophen (TYLENOL) 500 MG tablet Take 2 tablets (1,000 mg total) by mouth every 6 (six) hours. (Patient not taking:  Reported on 12/13/2017) 30 tablet 0   No current facility-administered medications for this visit.    Facility-Administered Medications Ordered in Other Visits  Medication Dose Route Frequency Provider Last Rate Last Dose  . sodium chloride 0.9 % injection 10 mL  10 mL Intravenous PRN Forest Gleason, MD   10 mL at 11/19/14 1628  . sodium chloride flush (NS) 0.9 % injection 10 mL  10 mL Intravenous PRN Cammie Sickle, MD   10 mL at 10/28/15 1135  . sodium chloride flush (NS) 0.9 % injection 10 mL  10 mL Intravenous PRN Cammie Sickle, MD   10 mL at 03/20/17 0831    Allergies as of 03/06/2018 - Review Complete 12/13/2017  Allergen Reaction Noted  . Ciprofloxacin Anaphylaxis 08/19/2013  . Paclitaxel Shortness Of Breath and Other (See Comments) 06/27/2014  . Flagyl [metronidazole] Other (See Comments) 06/22/2015    ROS:  General: Negative for anorexia, weight loss, fever, chills, fatigue, weakness. ENT: Negative for hoarseness, difficulty swallowing , nasal congestion. CV: Negative for chest pain, angina, palpitations, dyspnea on exertion, peripheral edema.  Respiratory: Negative for dyspnea at rest, dyspnea on exertion, cough, sputum, wheezing.  GI: See history of present illness. GU:  Negative for dysuria, hematuria, urinary incontinence, urinary frequency, nocturnal urination.  Endo: Negative for unusual weight change.    Physical Examination:   LMP 03/30/2013   General: Well-nourished, well-developed in no acute distress.  Eyes: No icterus. Conjunctivae pink. Mouth: Oropharyngeal mucosa moist and pink , no lesions erythema or exudate. Lungs: Clear  to auscultation bilaterally. Non-labored. Heart: Regular rate and rhythm, no murmurs rubs or gallops.  Abdomen: Bowel sounds are normal, nontender, nondistended, no hepatosplenomegaly or masses, no abdominal bruits or hernia , no rebound or guarding.   Extremities: No lower extremity edema. No clubbing or  deformities. Neuro: Alert and oriented x 3.  Grossly intact. Skin: Warm and dry, no jaundice.   Psych: Alert and cooperative, normal mood and affect.   Imaging Studies: No results found.  Assessment and Plan:   Debbie Richardson is a 57 y.o. y/o female here for follow up recurrence of gas and bloating . It is likely adhesions from surgery has contributed to bacterial overgrowth.Responded very well to Xifaxan in the past .  SIBO is notorious for recurrence and can do so in upto 50% and often requires multiple rounds of antibiotics.Treated recently with Augmentin with reasonably good response but not complete. Suggest trial of activated charcoal tablets. Would prefer to avoid antibiotics if possible. LOW FODMAP diet   Dr Debbie Bellows  MD,MRCP [facility]) Follow up PRN

## 2018-03-14 ENCOUNTER — Inpatient Hospital Stay: Payer: BLUE CROSS/BLUE SHIELD | Attending: Obstetrics and Gynecology | Admitting: Obstetrics and Gynecology

## 2018-03-14 ENCOUNTER — Other Ambulatory Visit: Payer: Self-pay

## 2018-03-14 ENCOUNTER — Other Ambulatory Visit: Payer: Self-pay | Admitting: Nurse Practitioner

## 2018-03-14 ENCOUNTER — Inpatient Hospital Stay: Payer: BLUE CROSS/BLUE SHIELD

## 2018-03-14 ENCOUNTER — Telehealth: Payer: Self-pay | Admitting: Gastroenterology

## 2018-03-14 VITALS — BP 132/91 | HR 101 | Temp 98.6°F | Resp 18 | Ht 62.0 in | Wt 132.8 lb

## 2018-03-14 DIAGNOSIS — C569 Malignant neoplasm of unspecified ovary: Secondary | ICD-10-CM

## 2018-03-14 DIAGNOSIS — Z87891 Personal history of nicotine dependence: Secondary | ICD-10-CM | POA: Insufficient documentation

## 2018-03-14 DIAGNOSIS — R140 Abdominal distension (gaseous): Secondary | ICD-10-CM

## 2018-03-14 DIAGNOSIS — Z90710 Acquired absence of both cervix and uterus: Secondary | ICD-10-CM | POA: Diagnosis not present

## 2018-03-14 DIAGNOSIS — Z9221 Personal history of antineoplastic chemotherapy: Secondary | ICD-10-CM | POA: Diagnosis not present

## 2018-03-14 DIAGNOSIS — R109 Unspecified abdominal pain: Secondary | ICD-10-CM

## 2018-03-14 DIAGNOSIS — Z90722 Acquired absence of ovaries, bilateral: Secondary | ICD-10-CM | POA: Diagnosis not present

## 2018-03-14 DIAGNOSIS — Z95828 Presence of other vascular implants and grafts: Secondary | ICD-10-CM

## 2018-03-14 LAB — COMPREHENSIVE METABOLIC PANEL
ALBUMIN: 5.1 g/dL — AB (ref 3.5–5.0)
ALT: 17 U/L (ref 0–44)
ANION GAP: 10 (ref 5–15)
AST: 21 U/L (ref 15–41)
Alkaline Phosphatase: 43 U/L (ref 38–126)
BILIRUBIN TOTAL: 1.5 mg/dL — AB (ref 0.3–1.2)
BUN: 13 mg/dL (ref 6–20)
CALCIUM: 9.3 mg/dL (ref 8.9–10.3)
CO2: 25 mmol/L (ref 22–32)
Chloride: 105 mmol/L (ref 98–111)
Creatinine, Ser: 0.65 mg/dL (ref 0.44–1.00)
GFR calc Af Amer: >60 mL/min (ref >60)
GLUCOSE: 98 mg/dL (ref 70–99)
POTASSIUM: 3.7 mmol/L (ref 3.5–5.1)
SODIUM: 140 mmol/L (ref 135–145)
Total Protein: 7.7 g/dL (ref 6.5–8.1)

## 2018-03-14 LAB — CBC WITH DIFFERENTIAL/PLATELET
Abs Immature Granulocytes: 0.01 10*3/uL (ref 0.00–0.07)
BASOS ABS: 0 10*3/uL (ref 0.0–0.1)
BASOS PCT: 1 %
EOS PCT: 1 %
Eosinophils Absolute: 0 10*3/uL (ref 0.0–0.5)
HCT: 39.9 % (ref 36.0–46.0)
HEMOGLOBIN: 13.8 g/dL (ref 12.0–15.0)
Immature Granulocytes: 0 %
Lymphocytes Relative: 31 %
Lymphs Abs: 1.6 10*3/uL (ref 0.7–4.0)
MCH: 31.9 pg (ref 26.0–34.0)
MCHC: 34.6 g/dL (ref 30.0–36.0)
MCV: 92.1 fL (ref 80.0–100.0)
MONO ABS: 0.3 10*3/uL (ref 0.1–1.0)
Monocytes Relative: 7 %
NEUTROS ABS: 3.1 10*3/uL (ref 1.7–7.7)
NEUTROS PCT: 60 %
Platelets: 183 10*3/uL (ref 150–400)
RBC: 4.33 MIL/uL (ref 3.87–5.11)
RDW: 12 % (ref 11.5–15.5)
WBC: 5 10*3/uL (ref 4.0–10.5)
nRBC: 0 % (ref 0.0–0.2)

## 2018-03-14 LAB — BILIRUBIN, FRACTIONATED(TOT/DIR/INDIR): Total Bilirubin: 1.5 mg/dL — ABNORMAL HIGH (ref 0.3–1.2)

## 2018-03-14 LAB — LACTATE DEHYDROGENASE: LDH: 134 U/L (ref 98–192)

## 2018-03-14 MED ORDER — HEPARIN SOD (PORK) LOCK FLUSH 100 UNIT/ML IV SOLN: 500.0000 [IU] | Freq: Once | INTRAVENOUS | Status: DC

## 2018-03-14 MED ORDER — SODIUM CHLORIDE 0.9% FLUSH
10.0000 mL | Freq: Once | INTRAVENOUS | Status: DC
  Filled 2018-03-14: qty 10

## 2018-03-14 NOTE — Progress Notes (Signed)
She felels like her sx are worse- she feels like if she sticks to specific foods she feels ok but otherwise she feels like she is having IBS attack. Pain in lower abdomen and groin. She feels like so much gas build up. She just got finished with Augmentin round and it helped but it did not go away all together. She was on other meds but it was expensive and he had ran out of samples and so that is why she took round of atb.. The condition is chronic and worse and it is bothering her so much that she is feeling depressed

## 2018-03-14 NOTE — Progress Notes (Signed)
Gynecologic Oncology Interval Note  Referring Provider: Dr. Forest Gleason, Dr. 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 She is on control arm of GOG225 and completed the active portion of the study on 03/15/17.   09/22/2017 CT scan A/P  IMPRESSION: 1. Uterus and ovaries absent. No pelvic mass. No adenopathy. No a mental lesions. No liver lesions evident. 2. No appreciable bowel wall thickening or bowel obstruction. There are occasional sigmoid diverticula without diverticulitis. No abscess in the abdomen or pelvis. Appendix appears normal. 3. No evident renal or ureteral calculus on either side. No hydronephrosis.  12/13/2017 She saw Dr. Rogue Bussing on  with a negative exam and CA 125 8.9.  03/06/2018  She saw Dr. Bailey Mech, GI, who recommended Suggest trial of activated charcoal tablets and LOW FODMAP diet for Small Intestinal Bacterial Overgrowth. Her symptoms have continued to worsen and she is really miserable.   She is interested in having her port removed.    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 See prior notes for complete details. She was taken emergently to the operating room on 04/28/2014 by Dr. Glennon Mac and a laparoscopic procedure was performed.  Bilateral salpingo-oophorectomy with  placement of the cystic lesions in the Endo Catch bag and removal through the LLQ port. Per the operative note the right ovary did rupture and contents of the cysts were spilled into the pelvis.  The final pathology revealed high-grade serous adenocarcinoma involving both ovaries and fallopian tubes. Washings also revealed clusters of highly atypical glandular cells and surface  involvement of the ovary was present. Staging was felt to be stage II at least high grade serous ovarian cancer. Preoperative CA125 was 45.   She received 6 cycles of carboplatin/taxane chemotherapy via an IV port, last treatment June 2016.  She had an allergy to taxol and was switched to taxotere for the last 4 cycles. She underwent interval TLH/omentectomy on 10/29/2014 for ovarian cancer. Her final pathology was negative. She enrolled on GOG225.   CT scan 01/09/2015 negative  CT scan 06/24/2015 IMPRESSION: No evidence of urolithiasis, hydronephrosis, or other acute Findings. Colonic diverticulosis. No radiographic evidence of diverticulitis.  CT scan of abdomen and pelvis 12/09/2015 IMPRESSION: Status post hysterectomy and bilateral salpingo-oophorectomy. No evidence of recurrent or metastatic disease  PET scan 09/19/2016 IMPRESSION: Negative PET-CT.  No findings for hypermetabolic recurrent tumor.  CT scan of abdomen and pelvis 02/27/2017  IMPRESSION: 1. No acute findings are noted in the abdomen or pelvis to account for the patient's symptoms. Notably, there is a low stool burden despite the patient's reported history of constipation. 2. Colonic diverticulosis without evidence of acute diverticulitis at this time. 3. Additional incidental findings, as above.   She had menopausal symptoms and we discussed Effexor therapy, but she did not pursue. She has used magnesium and felt that improved her symptoms.   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 Bailey Mech, GI. She has been on multiple antibiotics and Xifaxan with complete resolution of symptoms. But recurrent symptoms.      CA125 results postchemotherapy  02/11/2015 10.4 03/12/2015 10.7 06/10/2015 10.0 03/15/2016  10.1 06/09/2016 9.9 09/14/2016 10.9 12/14/2016 9.8  02/17/17 12.1 03/20/2017 9.9 06/04/2017 11.4 09/13/2017 9.5 12/13/2017  8.9  Genetic testing- [employer] 83 gene Multi-Cancer Panel was negative for pathogenic mutation. She did have a VUS: SDHA c. 155C>T.   Screening mammogram on 08/05/2016 negative ordered by Dr. Rogue Bussing   Problem List: Patient Active Problem List   Diagnosis Date Noted  . Sensation of pressure in bladder area 08/02/2017  . Bloating 08/02/2017  . Menopause 09/14/2016  . Neoplasm, ovary, malignant, unspecified laterality ([place]) 12/09/2015  . Sinus pressure 08/17/2015  . Left flank pain 06/22/2015  . Abdominal tenderness of left lower quadrant 06/22/2015  . Lower abdominal pain 11/26/2014  . Status post laparoscopic hysterectomy 10/29/2014  . Allergic conjunctivitis and rhinitis 07/04/2012  . GERD (gastroesophageal reflux disease) 05/01/2007  . PSORIASIS NEC 12/07/2006    Past Medical History: Past Medical History:  Diagnosis Date  . Anxiety   . Arthritis   . Benign fundic gland polyps of stomach   . Dizziness    vertigo  . Gallstones   . GERD (gastroesophageal reflux disease)   . Ovarian cancer ([place]) 2016  . Personal history of chemotherapy     Past Surgical History: Past Surgical History:  Procedure Laterality Date  . CESAREAN SECTION    . CHOLECYSTECTOMY    . COLONOSCOPY    . LAPAROSCOPIC HYSTERECTOMY N/A 10/29/2014   Procedure: HYSTERECTOMY TOTAL LAPAROSCOPIC, OMENTUMECTOMY;  Surgeon: Gillis Ends, MD;  Location: [facility] ORS;  Service: Gynecology;  Laterality: N/A;  . LAPAROSCOPIC OOPHERECTOMY    . OOPHORECTOMY    . UPPER GI ENDOSCOPY      Family History: Family History  Problem Relation Age of Onset  . Hypertension Mother        52yo   . Hyperlipidemia Mother   . GER disease Mother   . Heart disease Father        60yo  . Breast cancer Sister 37  . Heart disease Paternal Grandmother   . Breast cancer Paternal Grandmother 62       deceased 27  . Kidney disease Neg Hx   . Liver disease Neg Hx   . Colon cancer Neg Hx   . Colon polyps Neg Hx   .  Esophageal cancer Neg Hx   . Pancreatic cancer Neg Hx     Social History: Social History   Socioeconomic History  . Marital status: Divorced    Spouse name: Not on file  . Number of children: 1  . Years of education: Not on file  . Highest education level: Not on file  Occupational History  . Not on file  Social Needs  . Financial resource strain: Not on file  . Food insecurity:    Worry: Not on file    Inability: Not on file  . Transportation needs:    Medical: Not on file    Non-medical: Not on file  Tobacco Use  . Smoking status: Former Smoker    Last attempt to quit: 04/05/1991    Years since quitting: 26.9  . Smokeless tobacco: Never Used  Substance and Sexual Activity  . Alcohol use: Yes    Alcohol/week: 0.0 standard drinks    Comment: 1 drink of wine daily  . Drug use: No  . Sexual activity: Yes  Lifestyle  . Physical activity:    Days per week: Not on file    Minutes per session: Not on file  . Stress: Not on file  Relationships  . Social connections:    Talks on phone:  Not on file    Gets together: Not on file    Attends religious service: Not on file    Active member of club or organization: Not on file    Attends meetings of clubs or organizations: Not on file    Relationship status: Not on file  . Intimate partner violence:    Fear of current or ex partner: Not on file    Emotionally abused: Not on file    Physically abused: Not on file    Forced sexual activity: Not on file  Other Topics Concern  . Not on file  Social History Narrative  . Not on file    Allergies: Allergies  Allergen Reactions  . Ciprofloxacin Anaphylaxis  . Paclitaxel Shortness Of Breath and Other (See Comments)    Other reaction(s): Tight chest (finding) Chest and facial flushing Flushing, chest tightness, SOB w/ Taxol on 05/26/14 at [place].  . Flagyl [Metronidazole] Other (See Comments)    headache    Current Medications: Current Outpatient Medications   Medication Sig Dispense Refill  . acetaminophen (TYLENOL) 500 MG tablet Take 2 tablets (1,000 mg total) by mouth every 6 (six) hours. 30 tablet 0   No current facility-administered medications for this visit.    Facility-Administered Medications Ordered in Other Visits  Medication Dose Route Frequency Provider Last Rate Last Dose  . sodium chloride 0.9 % injection 10 mL  10 mL Intravenous PRN Forest Gleason, MD   10 mL at 11/19/14 1628  . sodium chloride flush (NS) 0.9 % injection 10 mL  10 mL Intravenous PRN Cammie Sickle, MD   10 mL at 10/28/15 1135  . sodium chloride flush (NS) 0.9 % injection 10 mL  10 mL Intravenous PRN Cammie Sickle, MD   10 mL at 03/20/17 0831      Review of Systems General: negative  HEENT: negative  Lungs: no SOB/cough  Cardiac: no complaints  GI: abdominal bloating, pain/discomfort, constipation  GU: h/o bladder issues; negative today  Musculoskeletal: back pain, chronic  Extremities: no complaints  Skin: no complaints  Neuro: no complaints  Endocrine: no complaints  Psych: no complaints  Allergy complaints - seasonal      Objective:   Vitals:   03/14/18 1336  BP: (!) 132/91  Pulse: (!) 101  Resp: 18  Temp: 98.6 F (37 C)    Body mass index is 24.29 kg/m.  ECOG Performance Status: 1 - Symptomatic but completely ambulatory  General appearance: alert, cooperative and appears stated age HEENT:PERRLA, extra ocular movement intact, sclera clear, anicteric CV: Regular rate and rhythm Lungs: bilaterally clear to auscultation Lymph node survey: Negative for axillary, supraclavicular or inguinal adenopathy.  Abdomen: + bowel sounds, soft, nontender and nondistended. No hernias, masses, infection or ascites and incisions well healed.  Back:  no tenderness over spine Extremities: extremities normal, atraumatic, no cyanosis or edema Neurological exam reveals alert, oriented, normal speech, no focal findings or movement disorder  noted.  Pelvic: exam chaperoned by nurse;  Vulva: normal appearing vulva with no masses, tenderness or lesions; Vagina: normal vagina and cuff healed; BME: nontender and no masses; Uterus, Cervix: surgically absent. Rectal: confirmatory   Assessment:  Debbie Richardson is a 57 y.o. female with a history of stage II at least high grade serous ovarian cancer. Status post LS BSO and 6 cycles of platin/taxane chemotherapy.  No evidence of disease on CT or exam and CA125 normal in March 2019. Results pending today.  Interval TLH/Omentectomy  7/16 negative for malignancy. Persistent and slightly worsening abdominal pain, CT scan negative for metastatic disease, diverticular disease present. Persistent pain of uncertain etiology - GI diagnosis is bacterial overgrowth and adhesive disease. Prior CT scans and PET scan all negative for disease. Normal CA125. Symptoms concerning for recurrence vs persistent small intestinal bacterial overgrowth.   Menopausal symptoms, controlled.   Multiple medical complaints and issues.    Plan:   Problem List Items Addressed This Visit      Endocrine   Neoplasm, ovary, malignant, unspecified laterality ([place]) - Primary      We will follow up CA125 today. I ordered a CT scan to assess for disease given her GI complaints. If CT negative for recurrence I approved that port removal.   GOG225 study completed 03/15/2017. She will continue to alternate visits with [place] and Dr. Rogue Bussing every 3 months through 03/2019 then every 6 months as long as imaging is negative.   I recommended continued follow up with Dr. Bailey Mech to see if she can get access to  [facility] as she had complete resolution of symptoms with this drug in the past.   A total of 25 minutes were spent with the patient/family today; >50% was spent in education, counseling and coordination of care for stage II at least high grade serous ovarian cancer and abdominal pain.  Angeles Gaetana Michaelis, MD  CC:  Dr.  Prentice Docker Dr. Blain Pais

## 2018-03-14 NOTE — Telephone Encounter (Signed)
Pt stopped by to see if there were any samples of a medication she was given over the summer.(Forgot the name but it starts with a X) Oncologist believes the medication worked for pt issues. Pt would like more samples and additional info the drug.

## 2018-03-14 NOTE — Telephone Encounter (Signed)
Per Dr. Vicente Males, pt is okay to have Xifaxan samples.

## 2018-03-15 ENCOUNTER — Telehealth: Payer: Self-pay | Admitting: *Deleted

## 2018-03-15 LAB — CA 125: Cancer Antigen (CA) 125: 10.4 U/mL (ref 0.0–38.1)

## 2018-03-15 NOTE — Telephone Encounter (Signed)
T/C to Debbie Richardson this morning to inform of her CA-125 level, which is 10.4. Patient reports she went to see her GI physician following her appt with Dr. [name] yesterday, and he gave her samples of Xifaxan to take: 550mg  twice daily x 4 days. States she took this after leaving his office and felt significantly better by 8:00 last evening. States she still feels better today and that the medication should last her until she gets her CT scan next week. Will follow up with patient after CT scan next week. [name], BSN, MHA, OCN 03/15/2018 9:01 AM

## 2018-03-21 ENCOUNTER — Ambulatory Visit
Admission: RE | Admit: 2018-03-21 | Discharge: 2018-03-21 | Disposition: A | Discharge status: Home / Self Care | Payer: BLUE CROSS/BLUE SHIELD | Source: Ambulatory Visit | Attending: Obstetrics and Gynecology | Admitting: Obstetrics and Gynecology

## 2018-03-21 DIAGNOSIS — R109 Unspecified abdominal pain: Secondary | ICD-10-CM | POA: Diagnosis not present

## 2018-03-21 DIAGNOSIS — R140 Abdominal distension (gaseous): Secondary | ICD-10-CM | POA: Insufficient documentation

## 2018-03-21 DIAGNOSIS — C569 Malignant neoplasm of unspecified ovary: Secondary | ICD-10-CM | POA: Diagnosis not present

## 2018-03-21 MED ORDER — IOPAMIDOL (ISOVUE-300) INJECTION 61%
100.0000 mL | Freq: Once | INTRAVENOUS | Status: AC | PRN
  Administered 2018-03-21: 100 mL via INTRAVENOUS

## 2018-03-23 ENCOUNTER — Telehealth: Payer: Self-pay | Admitting: *Deleted

## 2018-03-23 ENCOUNTER — Telehealth: Payer: Self-pay

## 2018-03-23 NOTE — Telephone Encounter (Signed)
Patient called asking for results of CT   IMPRESSION: 1. No acute abdominal/pelvic findings, mass lesions or adenopathy. 2. Status post hysterectomy and bilateral salpingo oophorectomy. No worrisome peritoneal or omental implants to suggest recurrent ovarian cancer.   Electronically Signed   By: Marijo Sanes M.D.   On: 03/22/2018 10:43

## 2018-03-23 NOTE — Telephone Encounter (Signed)
Called and notified Ms. Driscoll with CT results. She has already followed up with GI.  IMPRESSION: 1. No acute abdominal/pelvic findings, mass lesions or adenopathy. 2. Status post hysterectomy and bilateral salpingo oophorectomy. No worrisome peritoneal or omental implants to suggest recurrent ovarian cancer Oncology Nurse Navigator Documentation  Navigator Location: [facility] (03/23/18 1100)   )Navigator Encounter Type: Telephone (03/23/18 1100) Telephone: Outgoing Call;Diagnostic Results (03/23/18 1100)                                                  Time Spent with Patient: 15 (03/23/18 1100)

## 2018-04-10 ENCOUNTER — Encounter (INDEPENDENT_AMBULATORY_CARE_PROVIDER_SITE_OTHER): Payer: Self-pay

## 2018-04-23 ENCOUNTER — Telehealth: Payer: Self-pay | Admitting: Gastroenterology

## 2018-04-23 NOTE — Telephone Encounter (Signed)
PATIENT CALLED IN & STATES SHE IS HAVING SEVER HEARTBURN LAST SEE IN 03-06-2018. SHE HAS TRIED SOME THINGS BUT UNABLE TO GET IT TO STOP. CAN HE CALL HER IN SOMETHING TO [facility] [place].

## 2018-04-23 NOTE — Telephone Encounter (Signed)
Called pt to inform her of Dr. Georgeann Oppenheim suggestions for pt to take Prilosec otc 40 mg and to commence on carafate 4 times daily.  Unable to contact pt. VM full.

## 2018-04-23 NOTE — Telephone Encounter (Signed)
Prilosec otc 40 mg BID and call in carafate QID

## 2018-04-25 ENCOUNTER — Inpatient Hospital Stay: Payer: BLUE CROSS/BLUE SHIELD | Attending: Internal Medicine

## 2018-04-26 ENCOUNTER — Other Ambulatory Visit: Payer: Self-pay

## 2018-04-26 ENCOUNTER — Encounter
Admission: RE | Disposition: A | Discharge status: Home / Self Care | Payer: Self-pay | Source: Home / Self Care | Attending: Vascular Surgery

## 2018-04-26 ENCOUNTER — Other Ambulatory Visit (INDEPENDENT_AMBULATORY_CARE_PROVIDER_SITE_OTHER): Payer: Self-pay | Admitting: Nurse Practitioner

## 2018-04-26 ENCOUNTER — Encounter: Payer: Self-pay | Admitting: Vascular Surgery

## 2018-04-26 ENCOUNTER — Ambulatory Visit
Admission: RE | Admit: 2018-04-26 | Discharge: 2018-04-26 | Disposition: A | Discharge status: Home / Self Care | Payer: BLUE CROSS/BLUE SHIELD | Attending: Vascular Surgery | Admitting: Vascular Surgery

## 2018-04-26 ENCOUNTER — Telehealth (INDEPENDENT_AMBULATORY_CARE_PROVIDER_SITE_OTHER): Payer: Self-pay

## 2018-04-26 DIAGNOSIS — Z881 Allergy status to other antibiotic agents status: Secondary | ICD-10-CM | POA: Diagnosis not present

## 2018-04-26 DIAGNOSIS — Z8543 Personal history of malignant neoplasm of ovary: Secondary | ICD-10-CM | POA: Diagnosis not present

## 2018-04-26 DIAGNOSIS — Z8249 Family history of ischemic heart disease and other diseases of the circulatory system: Secondary | ICD-10-CM | POA: Insufficient documentation

## 2018-04-26 DIAGNOSIS — Z87891 Personal history of nicotine dependence: Secondary | ICD-10-CM | POA: Insufficient documentation

## 2018-04-26 DIAGNOSIS — C569 Malignant neoplasm of unspecified ovary: Secondary | ICD-10-CM | POA: Diagnosis not present

## 2018-04-26 DIAGNOSIS — F419 Anxiety disorder, unspecified: Secondary | ICD-10-CM | POA: Insufficient documentation

## 2018-04-26 DIAGNOSIS — K219 Gastro-esophageal reflux disease without esophagitis: Secondary | ICD-10-CM | POA: Insufficient documentation

## 2018-04-26 DIAGNOSIS — M1990 Unspecified osteoarthritis, unspecified site: Secondary | ICD-10-CM | POA: Diagnosis not present

## 2018-04-26 DIAGNOSIS — Z452 Encounter for adjustment and management of vascular access device: Secondary | ICD-10-CM | POA: Insufficient documentation

## 2018-04-26 HISTORY — PX: PORTA CATH REMOVAL: CATH118286

## 2018-04-26 SURGERY — PORTA CATH REMOVAL
Anesthesia: Moderate Sedation

## 2018-04-26 MED ORDER — MIDAZOLAM HCL 5 MG/5ML IJ SOLN
INTRAMUSCULAR | Status: AC
  Filled 2018-04-26: qty 5

## 2018-04-26 MED ORDER — FAMOTIDINE 20 MG PO TABS: 40.0000 mg | ORAL_TABLET | Freq: Once | ORAL | Status: DC | PRN

## 2018-04-26 MED ORDER — DEXTROSE 5 % IV SOLN
2.0000 g | Freq: Once | INTRAVENOUS | Status: DC
  Administered 2018-04-26: 2 g via INTRAVENOUS

## 2018-04-26 MED ORDER — DIPHENHYDRAMINE HCL 50 MG/ML IJ SOLN: 50.0000 mg | Freq: Once | INTRAMUSCULAR | Status: DC | PRN

## 2018-04-26 MED ORDER — SODIUM CHLORIDE 0.9 % IV SOLN
INTRAVENOUS | Status: DC
  Administered 2018-04-26: 08:00:00 via INTRAVENOUS

## 2018-04-26 MED ORDER — ONDANSETRON HCL 4 MG/2ML IJ SOLN: 4.0000 mg | Freq: Four times a day (QID) | INTRAMUSCULAR | Status: DC | PRN

## 2018-04-26 MED ORDER — CEFAZOLIN SODIUM-DEXTROSE 2-4 GM/100ML-% IV SOLN: 2.0000 g | Freq: Once | INTRAVENOUS | Status: DC

## 2018-04-26 MED ORDER — MIDAZOLAM HCL 2 MG/2ML IJ SOLN
INTRAMUSCULAR | Status: DC | PRN
  Administered 2018-04-26: 2 mg via INTRAVENOUS
  Administered 2018-04-26: 1 mg via INTRAVENOUS

## 2018-04-26 MED ORDER — LIDOCAINE-EPINEPHRINE (PF) 1 %-1:200000 IJ SOLN
INTRAMUSCULAR | Status: AC
  Filled 2018-04-26: qty 30

## 2018-04-26 MED ORDER — HYDROMORPHONE HCL 1 MG/ML IJ SOLN: 1.0000 mg | Freq: Once | INTRAMUSCULAR | Status: DC | PRN

## 2018-04-26 MED ORDER — MIDAZOLAM HCL 2 MG/ML PO SYRP: 8.0000 mg | ORAL_SOLUTION | Freq: Once | ORAL | Status: DC | PRN

## 2018-04-26 MED ORDER — FENTANYL CITRATE (PF) 100 MCG/2ML IJ SOLN
INTRAMUSCULAR | Status: AC
  Filled 2018-04-26: qty 2

## 2018-04-26 MED ORDER — METHYLPREDNISOLONE SODIUM SUCC 125 MG IJ SOLR: 125.0000 mg | Freq: Once | INTRAMUSCULAR | Status: DC | PRN

## 2018-04-26 MED ORDER — SODIUM CHLORIDE 0.9 % IV SOLN
INTRAVENOUS | Status: AC | PRN
  Administered 2018-04-26: 500 mL via INTRAVENOUS

## 2018-04-26 MED ORDER — FENTANYL CITRATE (PF) 100 MCG/2ML IJ SOLN
INTRAMUSCULAR | Status: DC | PRN
  Administered 2018-04-26: 25 ug via INTRAVENOUS
  Administered 2018-04-26: 50 ug via INTRAVENOUS

## 2018-04-26 SURGICAL SUPPLY — 8 items
ADH SKN CLS APL DERMABOND .7 (GAUZE/BANDAGES/DRESSINGS) ×1
DERMABOND ADVANCED (GAUZE/BANDAGES/DRESSINGS) ×1
DERMABOND ADVANCED .7 DNX12 (GAUZE/BANDAGES/DRESSINGS) IMPLANT
PACK ANGIOGRAPHY (CUSTOM PROCEDURE TRAY) ×1 IMPLANT
PAD GROUND ADULT SPLIT (MISCELLANEOUS) ×1 IMPLANT
PENCIL ELECTRO HAND CTR (MISCELLANEOUS) ×1 IMPLANT
SUT MNCRL AB 4-0 PS2 18 (SUTURE) ×1 IMPLANT
SUT VICRYL+ 3-0 36IN CT-1 (SUTURE) ×1 IMPLANT

## 2018-04-26 NOTE — H&P (Signed)
[facility] Admission History & Physical  MRN : [id number]  Debbie Richardson is a 58 y.o. ([birth year]) female who presents with chief complaint of No chief complaint on file. Marland Kitchen  History of Present Illness: Patient presents today for removal of her Port-A-Cath.  This was placed 3 to 4 years ago and has not been necessary for treatments in the recent past.  She is no longer using the port and it is somewhat locally bothersome to her.  She desires to have this removed.  No fevers or chills.  No other complaints today  Current Facility-Administered Medications  Medication Dose Route Frequency Provider Last Rate Last Dose  . 0.9 %  sodium chloride infusion   Intravenous Continuous Kris Hartmann, NP 75 mL/hr at 04/26/18 0734    . ceFAZolin (ANCEF) IVPB 2g/100 mL premix  2 g Intravenous Once Algernon Huxley, MD      . diphenhydrAMINE (BENADRYL) injection 50 mg  50 mg Intravenous Once PRN Kris Hartmann, NP      . famotidine (PEPCID) tablet 40 mg  40 mg Oral Once PRN Kris Hartmann, NP      . HYDROmorphone (DILAUDID) injection 1 mg  1 mg Intravenous Once PRN Eulogio Ditch E, NP      . methylPREDNISolone sodium succinate (SOLU-MEDROL) 125 mg/2 mL injection 125 mg  125 mg Intravenous Once PRN Eulogio Ditch E, NP      . midazolam (VERSED) 2 MG/ML syrup 8 mg  8 mg Oral Once PRN Kris Hartmann, NP      . ondansetron [facility]) injection 4 mg  4 mg Intravenous Q6H PRN Kris Hartmann, NP       Facility-Administered Medications Ordered in Other Encounters  Medication Dose Route Frequency Provider Last Rate Last Dose  . sodium chloride 0.9 % injection 10 mL  10 mL Intravenous PRN Forest Gleason, MD   10 mL at 11/19/14 1628  . sodium chloride flush (NS) 0.9 % injection 10 mL  10 mL Intravenous PRN Cammie Sickle, MD   10 mL at 10/28/15 1135  . sodium chloride flush (NS) 0.9 % injection 10 mL  10 mL Intravenous PRN Cammie Sickle, MD   10 mL at 03/20/17 0831     Past Medical History:  Diagnosis Date  . Anxiety   . Arthritis   . Benign fundic gland polyps of stomach   . Dizziness    vertigo  . Gallstones   . GERD (gastroesophageal reflux disease)   . Ovarian cancer ([place]) 2016  . Personal history of chemotherapy     Past Surgical History:  Procedure Laterality Date  . CESAREAN SECTION    . CHOLECYSTECTOMY    . COLONOSCOPY    . LAPAROSCOPIC HYSTERECTOMY N/A 10/29/2014   Procedure: HYSTERECTOMY TOTAL LAPAROSCOPIC, OMENTUMECTOMY;  Surgeon: Gillis Ends, MD;  Location: [facility] ORS;  Service: Gynecology;  Laterality: N/A;  . LAPAROSCOPIC OOPHERECTOMY    . OOPHORECTOMY    . UPPER GI ENDOSCOPY      Social History Social History   Tobacco Use  . Smoking status: Former Smoker    Last attempt to quit: 04/05/1991    Years since quitting: 27.0  . Smokeless tobacco: Never Used  Substance Use Topics  . Alcohol use: Yes    Alcohol/week: 0.0 standard drinks    Comment: 1 drink of wine daily  . Drug use: No    Family History Family History  Problem Relation Age of  Onset  . Hypertension Mother        29yo   . Hyperlipidemia Mother   . GER disease Mother   . Heart disease Father        14yo  . Breast cancer Sister 42  . Heart disease Paternal Grandmother   . Breast cancer Paternal Grandmother 70       deceased 47  . Kidney disease Neg Hx   . Liver disease Neg Hx   . Colon cancer Neg Hx   . Colon polyps Neg Hx   . Esophageal cancer Neg Hx   . Pancreatic cancer Neg Hx     Allergies  Allergen Reactions  . Ciprofloxacin Anaphylaxis  . Paclitaxel Shortness Of Breath and Other (See Comments)    Other reaction(s): Tight chest (finding) Chest and facial flushing Flushing, chest tightness, SOB w/ Taxol on 05/26/14 at [place].  . Flagyl [Metronidazole] Other (See Comments)    headache     REVIEW OF SYSTEMS (Negative unless checked)  Constitutional: [] Weight loss  [] Fever  [] Chills Cardiac: [] Chest pain   [] Chest  pressure   [] Palpitations   [] Shortness of breath when laying flat   [] Shortness of breath at rest   [] Shortness of breath with exertion. Vascular:  [] Pain in legs with walking   [] Pain in legs at rest   [] Pain in legs when laying flat   [] Claudication   [] Pain in feet when walking  [] Pain in feet at rest  [] Pain in feet when laying flat   [] History of DVT   [] Phlebitis   [] Swelling in legs   [] Varicose veins   [] Non-healing ulcers Pulmonary:   [] Uses home oxygen   [] Productive cough   [] Hemoptysis   [] Wheeze  [] COPD   [] Asthma Neurologic:  [x] Dizziness  [] Blackouts   [] Seizures   [] History of stroke   [] History of TIA  [] Aphasia   [] Temporary blindness   [] Dysphagia   [] Weakness or numbness in arms   [] Weakness or numbness in legs Musculoskeletal:  [x] Arthritis   [] Joint swelling   [] Joint pain   [] Low back pain Hematologic:  [] Easy bruising  [] Easy bleeding   [] Hypercoagulable state   [] Anemic  [] Hepatitis Gastrointestinal:  [] Blood in stool   [] Vomiting blood  [x] Gastroesophageal reflux/heartburn   [] Difficulty swallowing. Genitourinary:  [] Chronic kidney disease   [] Difficult urination  [] Frequent urination  [] Burning with urination   [] Blood in urine Skin:  [] Rashes   [] Ulcers   [] Wounds Psychological:  [x] History of anxiety   []  History of major depression.  Physical Examination  Vitals:   04/26/18 0723  BP: 108/79  Pulse: 88  Resp: 15  Temp: (!) 97.5 F (36.4 C)  TempSrc: Oral  SpO2: 99%  Weight: 59.9 kg  Height: 5\' 2"  (1.575 m)   Body mass index is 24.14 kg/m. Gen: WD/WN, NAD Head: [place]/AT, No temporalis wasting.  Ear/Nose/Throat: Hearing grossly intact, nares w/o erythema or drainage, oropharynx w/o Erythema/Exudate,  Eyes: Conjunctiva clear, sclera non-icteric Neck: Trachea midline.  No JVD.  Pulmonary:  Good air movement, respirations not labored, no use of accessory muscles.  Cardiac: RRR, normal S1, S2. Vascular: Port in place in the right subclavicular location without  erythema or drainage Vessel Right Left  Radial Palpable Palpable   Musculoskeletal: M/S 5/5 throughout.  Extremities without ischemic changes.  No deformity or atrophy.  Neurologic: Sensation grossly intact in extremities.  Symmetrical.  Speech is fluent. Motor exam as listed above. Psychiatric: Judgment intact, Mood & affect appropriate for pt's clinical  situation. Dermatologic: No rashes or ulcers noted.  No cellulitis or open wounds.      CBC Lab Results  Component Value Date   WBC 5.0 03/14/2018   HGB 13.8 03/14/2018   HCT 39.9 03/14/2018   MCV 92.1 03/14/2018   PLT 183 03/14/2018    BMET    Component Value Date/Time   NA 140 03/14/2018 1300   NA 135 07/14/2014 0955   K 3.7 03/14/2018 1300   K 3.2 (L) 07/21/2014 1518   CL 105 03/14/2018 1300   CL 106 07/14/2014 0955   CO2 25 03/14/2018 1300   CO2 21 (L) 07/14/2014 0955   GLUCOSE 98 03/14/2018 1300   GLUCOSE 138 (H) 07/14/2014 0955   BUN 13 03/14/2018 1300   BUN 10 07/14/2014 0955   CREATININE 0.65 03/14/2018 1300   CREATININE 0.60 07/14/2014 0955   CALCIUM 9.3 03/14/2018 1300   CALCIUM 9.1 07/14/2014 0955   GFRNONAA >60 03/14/2018 1300   GFRNONAA >60 07/14/2014 0955   GFRAA >60 03/14/2018 1300   GFRAA >60 07/14/2014 0955   CrCl cannot be calculated (Patient's most recent lab result is older than the maximum 21 days allowed.).  COAG Lab Results  Component Value Date   INR 1.10 10/20/2014    Radiology No results found.    Assessment/Plan 1.  History of ovarian cancer.  No longer using her port and desires to have this removed.  Risks and benefits of port removal were discussed and the patient is agreeable to proceed    Leotis Pain, MD  04/26/2018 8:02 AM

## 2018-04-26 NOTE — Telephone Encounter (Signed)
patient called and left a message on the triage line and stated that when she was leaving the hospital a nurse told her that they would be sending in an antibiotic for her. No written prescription given to patient and per medication list no antibiotic was sent to pharmacy. Please advise if patient needs this medication.   Call [phone]

## 2018-04-26 NOTE — Discharge Instructions (Signed)
Implanted Port Removal, Care After Refer to this sheet in the next few weeks. These instructions provide you with information about caring for yourself after your procedure. Your health care provider may also give you more specific instructions. Your treatment has been planned according to current medical practices, but problems sometimes occur. Call your health care provider if you have any problems or questions after your procedure. What can I expect after the procedure? After the procedure, it is common to have: Soreness or pain near your incision. Some swelling or bruising near your incision.  Follow these instructions at home: Medicines Take over-the-counter and prescription medicines only as told by your health care provider. If you were prescribed an antibiotic medicine, take it as told by your health care provider. Do not stop taking the antibiotic even if you start to feel better. Bathing You may shower tomorrow.  Incision care You have an incision with sutures that are dissolvable and skin glue over top incision.  This skin glue will wear off over time.  Do not peel or try to remove this yourself. Keep your dressing dry. Check your incision area every day for signs of infection and if present call your doctor and let them know. Check for: More redness, swelling, or pain. More fluid or blood. Warmth. Pus or a bad smell. Driving If you received a sedative, do not drive for 24 hours after the procedure. If you did not receive a sedative, ask your health care provider when it is safe to drive. Activity Return to your normal activities as told by your health care provider. Ask your health care provider what activities are safe for you. Until your health care provider says it is safe: Do not lift anything that is heavier than 10 lb (4.5 kg). Do not do activities that involve lifting your arms over your head. General instructions Do not use any tobacco products, such as cigarettes,  chewing tobacco, and e-cigarettes. Tobacco can delay healing. If you need help quitting, ask your health care provider. Keep all follow-up visits as told by your health care provider. This is important. Contact a health care provider if: You have more redness, swelling, or pain around your incision. You have more fluid or blood coming from your incision. Your incision feels warm to the touch. You have pus or a bad smell coming from your incision. You have a fever. You have pain that is not relieved by your pain medicine. Get help right away if: You have chest pain. You have difficulty breathing. This information is not intended to replace advice given to you by your health care provider. Make sure you discuss any questions you have with your health care provider. Document Released: 03/02/2015 Document Revised: 08/27/2015 Document Reviewed: 12/24/2014 Elsevier Interactive Patient Education  2018 [employer]  

## 2018-04-26 NOTE — Telephone Encounter (Signed)
Pt is aware and has no questions or concerns at this time  

## 2018-04-26 NOTE — Op Note (Signed)
[facility]       Operative Note  Date: 04/26/2018  Preoperative diagnosis:  1. Ovarian cancer, completed therapy and no longer using port  Postoperative diagnosis:  Same as above  Procedures: #1. Removal of right jugular port a cath   Surgeon: Leotis Pain, MD  Anesthesia: Local with moderate conscious sedation for 15 minutes using 3 mg of Versed and 75 mcg of Fentanyl  Fluoroscopy time: none  Contrast used: 0  Estimated blood loss: Minimal  Indication for the procedure:  The patient is a 58 y.o. female who has completed treatment for ovarian cancer and no longer needs their Port-A-Cath. The patient desires to have this removed. Risks and benefits including need for potential replacement with recurrent disease were discussed and patient is agreeable to proceed.  Description of procedure: The patient was brought to the vascular and interventional radiology suite. Moderate conscious sedation was administered during a face to face encounter with the patient throughout the procedure with my supervision of the RN administering medicines and monitoring the patient's vital signs, pulse oximetry, telemetry and mental status throughout from the start of the procedure until the patient was taken to the recovery room.  The right neck chest and shoulder were sterilely prepped and draped, and a sterile surgical field was created. The area was then anesthetized with 1% lidocaine copiously. The previous incision was reopened and electrocautery used to dissected down to the port and the catheter. These were dissected free and the catheter was gently removed from the vein in its entirety. The port was dissected out from the fibrous connective tissue and the Prolene sutures were removed. The port was then removed in its entirety including the catheter. The wound was then closed with a 3-0 Vicryl and a 4-0 Monocryl and Dermabond was placed as a  dressing. The patient was then taken to the recovery room in stable condition having tolerated the procedure well.  Complications: none  Condition: stable   Leotis Pain, MD 04/26/2018 8:30 AM   This note was created with Dragon Medical transcription system. Any errors in dictation are purely unintentional.

## 2018-05-03 ENCOUNTER — Telehealth (INDEPENDENT_AMBULATORY_CARE_PROVIDER_SITE_OTHER): Payer: Self-pay | Admitting: Nurse Practitioner

## 2018-05-03 NOTE — Telephone Encounter (Signed)
PT left vm on nurse line stating she had her port removed x 1 week ago and now she has noticed a rash for the past couple of days around her adhesive and she is concerned about it. She is unsure of what to do

## 2018-05-03 NOTE — Telephone Encounter (Signed)
Sometimes the adhesive can be irritating.  She can rub some OTC cortisone cream on the wound.  She should also take a benadryl for the next few nights before bed to help it as well.

## 2018-05-03 NOTE — Telephone Encounter (Signed)
Pt is aware, states has been doing cortisone cream but will try the benadryl because the cream was not working, has no questions or concerns at this time

## 2018-05-31 ENCOUNTER — Other Ambulatory Visit: Payer: Self-pay | Admitting: Nurse Practitioner

## 2018-05-31 ENCOUNTER — Telehealth: Payer: Self-pay | Admitting: *Deleted

## 2018-05-31 DIAGNOSIS — C569 Malignant neoplasm of unspecified ovary: Secondary | ICD-10-CM

## 2018-05-31 NOTE — Telephone Encounter (Signed)
Received message from Mariea Clonts, RN navigator, that she had spoken to [facility] yesterday about whether she is still supposed to come in and see Dr. Rogue Bussing for follow up. Patient recalled that when she saw Dr. [name] in December she was told if her CT scan was negative, she would no longer need to come in every 3 months, but could just see Dr. [name] every 6 months. Patient called back and she states she is still scheduled to see Dr. Rogue Bussing on 06/13/2018 and questions whether she still has to follow up with him. Patient states Dr. [name] is the one who performs her pelvic exams and she feels it may not be necessary for her to continue seeing Dr. Rogue Bussing since her recent CT scan was negative and her CA-125 level was normal. Patient states she is willing to continue coming for the CA-125 labs every 3 months per Dr. Gershon Crane request. Spoke with both Dr. Rogue Bussing and Beckey Rutter, NP about the patient's request and they agree that she can begin every 6 month surveillance with Dr. [name] as long as she is willing to continue to have her CA-125 levels checked every 3 months and be diligent to report any new symptoms promptly. Patient called back to inform of this and message left on voicemail to return her call to me as soon as she is able. MD appt. with Dr. Rogue Bussing cancelled at patient and MD request. [name], BSN, MHA, OCN 05/31/2018 3:25 PM

## 2018-05-31 NOTE — Progress Notes (Signed)
Standing order for CA 125 placed. Discussed with Debbie Richardson that patient will have CA 125 drawn every 3 months with pelvic exam and gyn/Dr. [name] follow up every 6 months.

## 2018-06-13 ENCOUNTER — Inpatient Hospital Stay: Payer: BLUE CROSS/BLUE SHIELD | Attending: Internal Medicine

## 2018-06-13 ENCOUNTER — Other Ambulatory Visit: Payer: Self-pay

## 2018-06-13 ENCOUNTER — Ambulatory Visit: Payer: BLUE CROSS/BLUE SHIELD | Admitting: Internal Medicine

## 2018-06-13 DIAGNOSIS — Z8543 Personal history of malignant neoplasm of ovary: Secondary | ICD-10-CM | POA: Diagnosis not present

## 2018-06-13 DIAGNOSIS — Z87891 Personal history of nicotine dependence: Secondary | ICD-10-CM | POA: Insufficient documentation

## 2018-06-13 DIAGNOSIS — Z9221 Personal history of antineoplastic chemotherapy: Secondary | ICD-10-CM | POA: Insufficient documentation

## 2018-06-13 DIAGNOSIS — R1032 Left lower quadrant pain: Secondary | ICD-10-CM | POA: Diagnosis not present

## 2018-06-13 DIAGNOSIS — Z800 Family history of malignant neoplasm of digestive organs: Secondary | ICD-10-CM | POA: Diagnosis not present

## 2018-06-13 DIAGNOSIS — C569 Malignant neoplasm of unspecified ovary: Secondary | ICD-10-CM

## 2018-06-13 LAB — COMPREHENSIVE METABOLIC PANEL
ALK PHOS: 44 U/L (ref 38–126)
ALT: 21 U/L (ref 0–44)
AST: 22 U/L (ref 15–41)
Albumin: 4.6 g/dL (ref 3.5–5.0)
Anion gap: 6 (ref 5–15)
BUN: 12 mg/dL (ref 6–20)
CO2: 25 mmol/L (ref 22–32)
Calcium: 8.8 mg/dL — ABNORMAL LOW (ref 8.9–10.3)
Chloride: 106 mmol/L (ref 98–111)
Creatinine, Ser: 0.7 mg/dL (ref 0.44–1.00)
GFR calc Af Amer: >60 mL/min (ref >60)
GFR calc non Af Amer: >60 mL/min (ref >60)
Glucose, Bld: 96 mg/dL (ref 70–99)
Potassium: 3.9 mmol/L (ref 3.5–5.1)
Sodium: 137 mmol/L (ref 135–145)
TOTAL PROTEIN: 7.2 g/dL (ref 6.5–8.1)
Total Bilirubin: 1 mg/dL (ref 0.3–1.2)

## 2018-06-14 LAB — CA 125: Cancer Antigen (CA) 125: 9.2 U/mL (ref 0.0–38.1)

## 2018-06-15 ENCOUNTER — Telehealth: Payer: Self-pay | Admitting: Nurse Practitioner

## 2018-06-15 NOTE — Telephone Encounter (Signed)
Called patient and shared CA-125 result. Patient has follow up scheduled.

## 2018-09-10 ENCOUNTER — Other Ambulatory Visit: Payer: BLUE CROSS/BLUE SHIELD

## 2018-09-10 ENCOUNTER — Other Ambulatory Visit: Payer: Self-pay

## 2018-09-10 ENCOUNTER — Telehealth: Payer: Self-pay

## 2018-09-10 ENCOUNTER — Inpatient Hospital Stay: Payer: BC Managed Care – PPO | Attending: Internal Medicine

## 2018-09-10 DIAGNOSIS — Z8543 Personal history of malignant neoplasm of ovary: Secondary | ICD-10-CM | POA: Diagnosis not present

## 2018-09-10 DIAGNOSIS — C569 Malignant neoplasm of unspecified ovary: Secondary | ICD-10-CM

## 2018-09-10 NOTE — Telephone Encounter (Signed)
Received call from Debbie Richardson. She needs a lab encounter for her every 3 month CA125. Encounter made for today. She has standing orders in place.

## 2018-09-11 ENCOUNTER — Telehealth: Payer: Self-pay | Admitting: Gastroenterology

## 2018-09-11 ENCOUNTER — Telehealth: Payer: Self-pay

## 2018-09-11 LAB — CA 125: Cancer Antigen (CA) 125: 9.7 U/mL (ref 0.0–38.1)

## 2018-09-11 NOTE — Telephone Encounter (Signed)
Spoke with pt and informed her that we will have the Xifaxan samples available for pt to pick up at our office. Pt plans to pick up samples this week.

## 2018-09-11 NOTE — Telephone Encounter (Signed)
Pt is calling she states Dr. Vicente Males gives her samples of Rx xifaxan  She states because the cost to get prescription is too high please call pt she is needing more samples

## 2018-09-11 NOTE — Telephone Encounter (Signed)
Notified with normal CA 125 result.   Results for Debbie Richardson, Debbie Richardson (MRN [id number]) as of 09/11/2018 14:56  Ref. Range 09/10/2018 15:36  Cancer Antigen (CA) 125 Latest Ref Range: 0.0 - 38.1 U/mL 9.7

## 2018-09-12 ENCOUNTER — Ambulatory Visit: Payer: BLUE CROSS/BLUE SHIELD

## 2018-10-03 ENCOUNTER — Inpatient Hospital Stay: Payer: BC Managed Care – PPO | Attending: Obstetrics and Gynecology | Admitting: Nurse Practitioner

## 2018-10-03 ENCOUNTER — Other Ambulatory Visit: Payer: Self-pay

## 2018-10-03 VITALS — BP 123/83 | HR 92 | Temp 99.3°F | Resp 18 | Wt 128.1 lb

## 2018-10-03 DIAGNOSIS — Z90722 Acquired absence of ovaries, bilateral: Secondary | ICD-10-CM | POA: Diagnosis not present

## 2018-10-03 DIAGNOSIS — K5730 Diverticulosis of large intestine without perforation or abscess without bleeding: Secondary | ICD-10-CM | POA: Insufficient documentation

## 2018-10-03 DIAGNOSIS — Z90710 Acquired absence of both cervix and uterus: Secondary | ICD-10-CM | POA: Insufficient documentation

## 2018-10-03 DIAGNOSIS — Z9221 Personal history of antineoplastic chemotherapy: Secondary | ICD-10-CM | POA: Diagnosis not present

## 2018-10-03 DIAGNOSIS — Z87891 Personal history of nicotine dependence: Secondary | ICD-10-CM | POA: Insufficient documentation

## 2018-10-03 DIAGNOSIS — A049 Bacterial intestinal infection, unspecified: Secondary | ICD-10-CM | POA: Diagnosis not present

## 2018-10-03 DIAGNOSIS — Z08 Encounter for follow-up examination after completed treatment for malignant neoplasm: Secondary | ICD-10-CM | POA: Insufficient documentation

## 2018-10-03 DIAGNOSIS — Z8543 Personal history of malignant neoplasm of ovary: Secondary | ICD-10-CM | POA: Insufficient documentation

## 2018-10-03 DIAGNOSIS — Z79899 Other long term (current) drug therapy: Secondary | ICD-10-CM | POA: Insufficient documentation

## 2018-10-03 DIAGNOSIS — R109 Unspecified abdominal pain: Secondary | ICD-10-CM | POA: Insufficient documentation

## 2018-10-03 NOTE — Progress Notes (Signed)
Pt denies any difficulties or concerns today.

## 2018-10-03 NOTE — Patient Instructions (Signed)
Cancer Survivorship Information If you were diagnosed with cancer, you are a cancer survivor. Cancer survival starts during treatment and continues for the rest of your life. There are three phases of cancer survival:  Acute survivorship. This phase starts when you are diagnosed with cancer and lasts until cancer treatment is finished. The main focus of this phase is cancer treatment.  Extended survivorship. This phase begins once cancer treatment ends and lasts several months. The focus of this phase is managing the effects of treatment.  Permanent survivorship. This phase starts years after treatment when return of the disease (recurrence) is less likely to occur. This phase focuses on the long-term effects of treatment. With earlier detection of cancer and better treatments, cancer survivors are living much longer than they used to. Most cancer survivors today live for many years. How can being a cancer survivor affect my daily life? Cancer survival can affect your daily life in many ways. As a cancer survivor, you may feel:  Anxiety or fear that your cancer will not respond to treatment, or may come back after treatment.  Relief to be finished with treatment.  Anxiety after treatment ends because you are having less contact with the health care team.  A new appreciation for life. Depending on the type of cancer and treatment you have or had, you may have to deal with side effects, such as.  Fatigue.  Appetite changes.  Changes to your hair, skin, or body.  Changes in fertility or sexual function.  Stress.  Memory problems. You may also need:  Regular medical care.  Regular follow-up visits with your health care providers.  Rehabilitation therapy. It may take some time to adjust to changes in the way you live your life. What are some things I can do to take care of myself? Work with your health care provider to create a cancer survivor follow-up care plan. The plan will  help you stay healthy and live well after cancer. The details of the plan will depend on your cancer and your individual needs. Your plan may include these instructions:  Continue to get routine medical care, including dental care, vaccinations, and screening exams.  Keep all medical records related to your cancer care in a safe place.  Write down: ? All of your important contacts. ? The types of treatments you have had. ? The medicines you are taking.  Understand the symptoms that indicate your cancer has returned. Report any of these symptoms to your health care provider right away.  Do not use any products that contain nicotine or tobacco, such as cigarettes and e-cigarettes. If you need help quitting, ask your health care provider.  Do not use illegal drugs.  Do not abuse alcohol. Limit alcohol intake to no more than 1 drink a day for nonpregnant women and 2 drinks a day for men. One drink equals 12 oz of beer, 5 oz of wine, or 1 oz of hard liquor.  Eat a healthy diet and maintain a healthy weight. A healthy diet includes lots of fruits and vegetables, low-fat dairy, lean meats, and fiber. If you are struggling to maintain a healthy weight, it may help to work with a nutritionist.  Get exercise on most days of the week. Exercise can help you reduce stress, sleep better, and maintain a healthy weight. Ask your health care provider how much and what type of exercise is safe for you.  Find ways to reduce stress. This may include meditation, relaxation therapy, or mind/body   techniques like yoga or tai chi. Spend time with friends and loved ones doing the things that bring you joy.  Get 7-9 hours of sleep every night.  Take over-the-counter and prescription medicines only as told by your health care provider.  Consider joining a cancer survivor support group.  Keep all follow-up visits as told by your health care provider. This is important. What are the benefits of creating a plan  and following it? There are many benefits of having a cancer survivor follow-up care plan and sticking to it. They include:  Better emotional health.  Better physical health.  A higher chance of finding a cancer recurrence early, when it is most treatable. Taking care of yourself may also reduce your risk for:  A mental health disorder like anxiety or depression.  Obesity.  Heart problems.  High blood pressure.  High cholesterol.  Diabetes. What can happen if I do not create or follow a plan? If you do not create a cancer survivor follow-up care plan, or if you do not follow your plan, you may have a less healthy cancer survival. You may be at higher risk for:  A second cancer.  Your cancer coming back.  Other medical conditions. Where can I get support? For support, you may turn to:  Your cancer doctor (oncologist).  Local support groups. Ask your oncologist to suggest groups.  The [employer] for Cancer Survivorship: https://www.canceradvocacy.org/resources/cancer-survival-toolbox/special-topics/living-beyond-cancer  The Cancer Survivors Network: http://csn.cancer.org/?_ga=1.243059842.343-856-3748.413-360-2229 Where can I get more information? Learn more about cancer survivorship from these sources:  [place]: MyBloggers.cz  [place]: http://www.cancer.org/treatment/survivorshipduringandaftertreatment/index Contact a health care provider if:  You have any new or unusual symptoms.  You have a new or increasing pain.  You are very tired or weak.  You are losing weight.  You feel anxious, sad, or depressed. Summary  If you have a history of cancer, you are a cancer survivor.  Surviving cancer can affect you physically and emotionally.  Talk to your health care provider about creating a cancer survivor follow-up care plan.  Benefits of a cancer survivor follow-up care plan  include better emotional and physical health, which means a better quality of life for you. This information is not intended to replace advice given to you by your health care provider. Make sure you discuss any questions you have with your health care provider. Document Released: 03/21/2016 Document Revised: 03/03/2017 Document Reviewed: 03/21/2016 Elsevier Patient Education  2020 [employer].

## 2018-10-03 NOTE — Progress Notes (Signed)
Gynecologic Oncology Interval Note  Referring Provider: Dr. 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 She is on control arm of GOG225 and completed the active portion of the study on 03/15/17.   Patient last seen by Dr. [name] on 03/14/2018.  Negative exam and normal Ca125 however, GI symptoms concerning for recurrence versus persistent small intestinal bacterial overgrowth syndrome. CT Abdomen Pelvis on 03/22/2018 showed no acute abdominal/pelvic findings, masses, or adenopathy. No worrisome peritoneal or omental implants.   In the interim, port was removed by Dr. Lucky Cowboy on 04/26/2018.    CA-125 has been followed.  Preoperative CA-125 was 45. 06/13/2018 9.2 09/10/2018 9.7  She continue Xifaxan intermittently per GI for SIBO. She continues to have intermittent diarrhea and constipation with nausea but feels symptoms are slightly improved over past year and resolve at times. She notices they tend to occur with specific foods. She has been social distancing with covid and continues to work fulltime. She says that she feels well.    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 See prior notes for complete details. She was taken emergently to the operating room on 04/28/2014 by Dr. Glennon Mac and a laparoscopic procedure was performed.  Bilateral salpingo-oophorectomy with  placement of the cystic lesions in the Endo Catch bag and removal through the LLQ port. Per the operative note the right ovary did rupture and contents of the cysts were spilled into the pelvis.  The final pathology revealed high-grade serous adenocarcinoma involving both ovaries and fallopian tubes. Washings  also revealed clusters of highly atypical glandular cells and surface involvement of the ovary was present. Staging was felt to be stage II at least high grade serous ovarian cancer. Preoperative CA-125 was 45.   She received 6 cycles of carboplatin/taxane chemotherapy via an IV port, last treatment June 2016.  She had an allergy to taxol and was switched to taxotere for the last 4 cycles. She underwent interval TLH/omentectomy on 10/29/2014 for ovarian cancer. Her final pathology was negative. She enrolled on GOG225.   CT scan 01/09/2015 negative  CT scan 06/24/2015 IMPRESSION: No evidence of urolithiasis, hydronephrosis, or other acute Findings. Colonic diverticulosis. No radiographic evidence of diverticulitis.  CT scan of abdomen and pelvis 12/09/2015 IMPRESSION: Status post hysterectomy and bilateral salpingo-oophorectomy. No evidence of recurrent or metastatic disease  PET scan 09/19/2016 IMPRESSION: Negative PET-CT.  No findings for hypermetabolic recurrent tumor.  CT scan of abdomen and pelvis 02/27/2017  IMPRESSION: 1. No acute findings are noted in the abdomen or pelvis to account for the patient's symptoms. Notably, there is a low stool burden despite the patient's reported history of constipation. 2. Colonic diverticulosis without evidence of acute diverticulitis at this time. 3. Additional incidental findings, as above.   She had menopausal symptoms and we discussed Effexor therapy, but she did not pursue. She has used magnesium and felt that improved her symptoms.   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 Bailey Mech, GI. She has been on multiple antibiotics and Xifaxan with complete resolution of symptoms. But recurrent symptoms.    09/22/2017 CT scan A/P  IMPRESSION: 1. Uterus and ovaries absent. No pelvic mass.  No adenopathy. No a mental lesions. No liver lesions evident. 2. No appreciable bowel  wall thickening or bowel obstruction. There are occasional sigmoid diverticula without diverticulitis. No abscess in the abdomen or pelvis. Appendix appears normal. 3. No evident renal or ureteral calculus on either side. No hydronephrosis.  12/13/2017 She saw Dr. Rogue Bussing on  with a negative exam and CA 125 8.9.  03/06/2018  She saw Dr. Bailey Mech, GI, who recommended Suggest trial of activated charcoal tablets and LOW FODMAP diet for Small Intestinal Bacterial Overgrowth. Her symptoms have continued to worsen and she is really miserable.   CA-125 results postchemotherapy  02/11/2015  10.4 03/12/2015  10.7 06/10/2015  10.0 03/15/2016   10.1 06/09/2016  9.9 09/14/2016  10.9 12/14/2016  9.8 02/17/17  12.1 03/20/2017  9.9 06/04/2017  11.4 09/13/2017  9.5 12/13/2017  8.9 03/14/2018 10.4 06/13/2018 9.2 09/10/2018 9.7   Genetic testing- [employer] 83 gene Multi-Cancer Panel was negative for pathogenic mutation. She did have a VUS: SDHA c. 155C>T.   Screening mammogram - 12/13/2017 Screening Mammogram- Bi-rads category 1: negative   Problem List: Patient Active Problem List   Diagnosis Date Noted  . History of ovarian cancer 10/03/2018  . Sensation of pressure in bladder area 08/02/2017  . Bloating 08/02/2017  . Menopause 09/14/2016  . Neoplasm, ovary, malignant, unspecified laterality ([place]) 12/09/2015  . Sinus pressure 08/17/2015  . Left flank pain 06/22/2015  . Abdominal tenderness of left lower quadrant 06/22/2015  . Lower abdominal pain 11/26/2014  . Status post laparoscopic hysterectomy 10/29/2014  . Allergic conjunctivitis and rhinitis 07/04/2012  . GERD (gastroesophageal reflux disease) 05/01/2007  . PSORIASIS NEC 12/07/2006    Past Medical History: Past Medical History:  Diagnosis Date  . Anxiety   . Arthritis   . Benign fundic gland polyps of stomach   . Dizziness    vertigo  . Gallstones   . GERD (gastroesophageal reflux disease)   . Ovarian cancer ([place]) 2016  . Personal  history of chemotherapy     Past Surgical History: Past Surgical History:  Procedure Laterality Date  . CESAREAN SECTION    . CHOLECYSTECTOMY    . COLONOSCOPY    . LAPAROSCOPIC HYSTERECTOMY N/A 10/29/2014   Procedure: HYSTERECTOMY TOTAL LAPAROSCOPIC, OMENTUMECTOMY;  Surgeon: Gillis Ends, MD;  Location: [facility] ORS;  Service: Gynecology;  Laterality: N/A;  . LAPAROSCOPIC OOPHERECTOMY    . OOPHORECTOMY    . PORTA CATH REMOVAL N/A 04/26/2018   Procedure: PORTA CATH REMOVAL;  Surgeon: Algernon Huxley, MD;  Location: [facility];  Service: Cardiovascular;  Laterality: N/A;  . UPPER GI ENDOSCOPY      Family History: Family History  Problem Relation Age of Onset  . Hypertension Mother        53yo   . Hyperlipidemia Mother   . GER disease Mother   . Heart disease Father        58yo  . Breast cancer Sister 44  . Heart disease Paternal Grandmother   . Breast cancer Paternal Grandmother 63       deceased 52  . Kidney disease Neg Hx   . Liver disease Neg Hx   . Colon cancer Neg Hx   . Colon polyps Neg Hx   . Esophageal cancer Neg Hx   . Pancreatic cancer Neg Hx     Social History: Social History   Socioeconomic History  . Marital status: Divorced    Spouse name: Not on file  . Number of children: 1  .  Years of education: Not on file  . Highest education level: Not on file  Occupational History  . Not on file  Social Needs  . Financial resource strain: Not on file  . Food insecurity    Worry: Not on file    Inability: Not on file  . Transportation needs    Medical: Not on file    Non-medical: Not on file  Tobacco Use  . Smoking status: Former Smoker    Quit date: 04/05/1991    Years since quitting: 27.5  . Smokeless tobacco: Never Used  Substance and Sexual Activity  . Alcohol use: Yes    Alcohol/week: 0.0 standard drinks    Comment: 1 drink of wine daily  . Drug use: No  . Sexual activity: Yes  Lifestyle  . Physical activity    Days per week: Not  on file    Minutes per session: Not on file  . Stress: Not on file  Relationships  . Social Herbalist on phone: Not on file    Gets together: Not on file    Attends religious service: Not on file    Active member of club or organization: Not on file    Attends meetings of clubs or organizations: Not on file    Relationship status: Not on file  . Intimate partner violence    Fear of current or ex partner: Not on file    Emotionally abused: Not on file    Physically abused: Not on file    Forced sexual activity: Not on file  Other Topics Concern  . Not on file  Social History Narrative  . Not on file    Allergies: Allergies  Allergen Reactions  . Ciprofloxacin Anaphylaxis  . Paclitaxel Shortness Of Breath and Other (See Comments)    Other reaction(s): Tight chest (finding) Chest and facial flushing Flushing, chest tightness, SOB w/ Taxol on 05/26/14 at [place].  . Flagyl [Metronidazole] Other (See Comments)    headache    Current Medications: Current Outpatient Medications  Medication Sig Dispense Refill  . acetaminophen (TYLENOL) 500 MG tablet Take 2 tablets (1,000 mg total) by mouth every 6 (six) hours. 30 tablet 0   No current facility-administered medications for this visit.    Facility-Administered Medications Ordered in Other Visits  Medication Dose Route Frequency Provider Last Rate Last Dose  . sodium chloride 0.9 % injection 10 mL  10 mL Intravenous PRN Forest Gleason, MD   10 mL at 11/19/14 1628  . sodium chloride flush (NS) 0.9 % injection 10 mL  10 mL Intravenous PRN Cammie Sickle, MD   10 mL at 10/28/15 1135  . sodium chloride flush (NS) 0.9 % injection 10 mL  10 mL Intravenous PRN Cammie Sickle, MD   10 mL at 03/20/17 0831   Review of Systems General:  no complaints Skin: no complaints Eyes: no complaints HEENT: no complaints; chronic seasonal allergies Breasts: no complaints Pulmonary: no complaints Cardiac: no  complaints Gastrointestinal: per hpi Genitourinary/Sexual: no complaints Ob/Gyn: no complaints Musculoskeletal: chronic low back pain; stable & unchanged Hematology: no complaints Neurologic/Psych: no complaints   Objective:   Vitals:   10/03/18 1126  BP: 123/83  Pulse: 92  Resp: 18  Temp: 99.3 F (37.4 C)    Body mass index is 23.42 kg/m.  ECOG Performance Status: 0 - Asymptomatic  GENERAL: Patient is a well appearing female in no acute distress HEENT:  Sclera clear. Anicteric.  NODES:  Negative axillary, supraclavicular, inguinal lymph node survery LUNGS:  Clear to auscultation bilaterally. No wheezes or rhonchi HEART:  Regular rate and rhythm ABDOMEN:  Soft, nontender.  No hernias, incisions well healed. No masses or ascites EXTREMITIES:  No peripheral edema. Atraumatic. No cyanosis SKIN:  Clear with no obvious rashes or skin changes NEURO:  Nonfocal. Well oriented.  Appropriate affect  Pelvic: exam chaperoned by nurse. Vulva: normal appearing without masses, tenderness or lesions. Vagina: normal appearing; cuff well healed. BME: nontender, no masses. Uterus, Cervix: surgically absent. Rectal: smooth, confirmatory.    Assessment:  Debbie Richardson is a 58 y.o. 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 No evidence of disease on CT or exam and CA125 normal in March 2019. Interval TLH/Omentectomy 7/16 negative for malignancy. Interval imaging significant for diverticular disease. Persistent pain worked up by GI and thought to be consistent with bacterial overgrowth syndrome and adhesive disease. Prior CT scans and PET all negative for recurrent disease. Port removed. Normal CA-125. GI symptoms improved today. NED on exam today.   Multiple medical complaints and issues improved currently.    Plan:   Problem List Items Addressed This Visit      Other   History of ovarian cancer -  Primary   Relevant Orders   CA 125   CA 125      CA 125 normal today. Plan to repeat in 3 months then again in 6 months. Will plan for surveillance visit with Dr. [name] in 6 months or sooner if concerning or worsening symptoms or pain. Would consider imaging if increased pain or other concerning symptoms.   I recommended she continue close follow up with Dr. Vicente Males for bacterial overgrowth syndrome with Xifaxan.   A total of 25 minutes were spent with the patient/family today; >50% was spent in education, counseling and coordination of care for stage II at least high grade serous ovarian cancer and abdominal pain.  Beckey Rutter, DNP, AGNP-C [place] [facility] (work cell) [phone] (office)  CC: Dr. [name], Dr. Vicente Males

## 2019-01-03 ENCOUNTER — Other Ambulatory Visit: Payer: BC Managed Care – PPO

## 2019-01-04 ENCOUNTER — Inpatient Hospital Stay: Payer: BC Managed Care – PPO | Attending: Obstetrics and Gynecology | Admitting: *Deleted

## 2019-01-04 ENCOUNTER — Other Ambulatory Visit: Payer: Self-pay

## 2019-01-04 DIAGNOSIS — Z8543 Personal history of malignant neoplasm of ovary: Secondary | ICD-10-CM

## 2019-01-05 LAB — CA 125: Cancer Antigen (CA) 125: 10.2 U/mL (ref 0.0–38.1)

## 2019-01-07 ENCOUNTER — Telehealth: Payer: Self-pay

## 2019-01-07 DIAGNOSIS — Z1231 Encounter for screening mammogram for malignant neoplasm of breast: Secondary | ICD-10-CM | POA: Insufficient documentation

## 2019-01-07 NOTE — Telephone Encounter (Signed)
Pt left v/m that under oncology care for 4 yrs but no longer under oncology care; it is time for pt to have a mammogram and oncology advised pt to ck with PCP to place order for mammo. Per chart review last 3D screening bilateral mammo done 12/13/17. In v/m pt wanted to know if could be ordered without pt coming in to office. Last seen for acute on 08/02/2017 but no recent CPX seen & no future appts scheduled.Please advise.

## 2019-01-07 NOTE — Telephone Encounter (Signed)
Pt notified order done and she can call and schedule appt

## 2019-01-07 NOTE — Telephone Encounter (Signed)
I put the order in for screening mammogram at [facility]

## 2019-02-27 ENCOUNTER — Telehealth: Payer: Self-pay

## 2019-02-27 DIAGNOSIS — Z20828 Contact with and (suspected) exposure to other viral communicable diseases: Secondary | ICD-10-CM | POA: Diagnosis not present

## 2019-02-27 NOTE — Telephone Encounter (Signed)
Agree with that advisement  Will watch for notes/records

## 2019-02-27 NOTE — Telephone Encounter (Signed)
Pt said she is allergic to some nuts and pt ate all spice last night and afterwards found out that nutmeg is in all spice. Pt said middle of night last night pt had chest tightness and tightness in her throat. Pt said now she continues with chest tightness,tightness in throat and some difficulty swallowing and breathing. I advised pt she does need to be seen right away and pt said she is sitting in parking lot of [facility] in [place]. Pt will go inside now for eval and treat. FYI to Dr Glori Bickers

## 2019-04-08 ENCOUNTER — Other Ambulatory Visit: Payer: BC Managed Care – PPO

## 2019-04-10 ENCOUNTER — Ambulatory Visit: Payer: BC Managed Care – PPO

## 2019-04-23 ENCOUNTER — Encounter: Payer: Self-pay | Admitting: Nurse Practitioner

## 2019-05-06 ENCOUNTER — Inpatient Hospital Stay: Payer: BC Managed Care – PPO | Attending: Obstetrics and Gynecology

## 2019-05-06 ENCOUNTER — Other Ambulatory Visit: Payer: Self-pay

## 2019-05-06 DIAGNOSIS — K219 Gastro-esophageal reflux disease without esophagitis: Secondary | ICD-10-CM | POA: Insufficient documentation

## 2019-05-06 DIAGNOSIS — Z9221 Personal history of antineoplastic chemotherapy: Secondary | ICD-10-CM | POA: Diagnosis not present

## 2019-05-06 DIAGNOSIS — Z87891 Personal history of nicotine dependence: Secondary | ICD-10-CM | POA: Insufficient documentation

## 2019-05-06 DIAGNOSIS — C569 Malignant neoplasm of unspecified ovary: Secondary | ICD-10-CM | POA: Insufficient documentation

## 2019-05-06 DIAGNOSIS — Z9079 Acquired absence of other genital organ(s): Secondary | ICD-10-CM | POA: Insufficient documentation

## 2019-05-06 DIAGNOSIS — Z90710 Acquired absence of both cervix and uterus: Secondary | ICD-10-CM | POA: Diagnosis not present

## 2019-05-06 DIAGNOSIS — Z90722 Acquired absence of ovaries, bilateral: Secondary | ICD-10-CM | POA: Diagnosis not present

## 2019-05-06 DIAGNOSIS — Z8543 Personal history of malignant neoplasm of ovary: Secondary | ICD-10-CM

## 2019-05-07 LAB — CA 125: Cancer Antigen (CA) 125: 9 U/mL (ref 0.0–38.1)

## 2019-05-08 ENCOUNTER — Other Ambulatory Visit: Payer: Self-pay

## 2019-05-08 ENCOUNTER — Inpatient Hospital Stay (HOSPITAL_BASED_OUTPATIENT_CLINIC_OR_DEPARTMENT_OTHER): Payer: BC Managed Care – PPO | Admitting: Obstetrics and Gynecology

## 2019-05-08 VITALS — BP 116/81 | HR 86 | Temp 98.8°F | Resp 18 | Wt 133.8 lb

## 2019-05-08 DIAGNOSIS — Z9221 Personal history of antineoplastic chemotherapy: Secondary | ICD-10-CM | POA: Diagnosis not present

## 2019-05-08 DIAGNOSIS — K219 Gastro-esophageal reflux disease without esophagitis: Secondary | ICD-10-CM | POA: Diagnosis not present

## 2019-05-08 DIAGNOSIS — Z90722 Acquired absence of ovaries, bilateral: Secondary | ICD-10-CM

## 2019-05-08 DIAGNOSIS — C569 Malignant neoplasm of unspecified ovary: Secondary | ICD-10-CM

## 2019-05-08 DIAGNOSIS — Z87891 Personal history of nicotine dependence: Secondary | ICD-10-CM | POA: Diagnosis not present

## 2019-05-08 DIAGNOSIS — Z90710 Acquired absence of both cervix and uterus: Secondary | ICD-10-CM | POA: Diagnosis not present

## 2019-05-08 DIAGNOSIS — Z9079 Acquired absence of other genital organ(s): Secondary | ICD-10-CM | POA: Diagnosis not present

## 2019-05-08 NOTE — Progress Notes (Signed)
Gynecologic Oncology Interval Note  Referring Provider: Dr. 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 She is on control arm of GOG225 and completed the active portion of the study on 03/15/17.   Last seen in July 2020 with negative exam and normal CA 125. GI symptoms at that time were improved. She has history of small intestinal bacterial overgrowth syndrome with previous negative imaging in December 2019.   CA-125 has been followed. Pre-operative CA-125 was 45.  05/06/19  9.0 01/04/2019 10.2 09/10/2018 9.7 06/13/2018 9.2  She has not yet had 2020 mammogram. Last colonoscopy in 2016 with recommendation to repeat in 2026. Today she continues to have some intermittent back pain which is chronic. Also complains of vaginal discharge. She continues to take Xifaxan intermittently per GI for SIBO. GI symptoms overall improved.    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 See prior notes for complete details. She was taken emergently to the operating room on 04/28/2014 by Dr. Glennon Mac and a laparoscopic procedure was performed.  Bilateral salpingo-oophorectomy with  placement of the cystic lesions in the Endo Catch bag and removal through the LLQ port. Per the operative note the right ovary did rupture and contents of the cysts were spilled into the pelvis.  The final pathology revealed high-grade serous adenocarcinoma involving both ovaries and fallopian tubes. Washings also revealed clusters of highly atypical glandular cells and surface involvement of the ovary was present. Staging was felt to be stage II at least high grade serous ovarian cancer. Preoperative CA-125 was 45.    She received 6 cycles of carboplatin/taxane chemotherapy via an IV port, last treatment June 2016.  She had an allergy to taxol and was switched to taxotere for the last 4 cycles. She underwent interval TLH/omentectomy on 10/29/2014 for ovarian cancer. Her final pathology was negative. She enrolled on GOG225.   CT scan 01/09/2015 negative  CT scan 06/24/2015 IMPRESSION: No evidence of urolithiasis, hydronephrosis, or other acute Findings. Colonic diverticulosis. No radiographic evidence of diverticulitis.  CT scan of abdomen and pelvis 12/09/2015 IMPRESSION: Status post hysterectomy and bilateral salpingo-oophorectomy. No evidence of recurrent or metastatic disease  PET scan 09/19/2016 IMPRESSION: Negative PET-CT.  No findings for hypermetabolic recurrent tumor.  CT scan of abdomen and pelvis 02/27/2017  IMPRESSION: 1. No acute findings are noted in the abdomen or pelvis to account for the patient's symptoms. Notably, there is a low stool burden despite the patient's reported history of constipation. 2. Colonic diverticulosis without evidence of acute diverticulitis at this time. 3. Additional incidental findings, as above.   She had menopausal symptoms and we discussed Effexor therapy, but she did not pursue. She has used magnesium and felt that improved her symptoms.   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 Bailey Mech, GI. She has been on multiple antibiotics and Xifaxan with complete resolution of symptoms. But recurrent symptoms.    09/22/2017 CT scan A/P  IMPRESSION: 1. Uterus and ovaries absent. No pelvic mass. No adenopathy. No a mental lesions. No liver lesions evident. 2. No appreciable bowel wall thickening or bowel obstruction. There are occasional sigmoid diverticula without diverticulitis. No abscess in the  abdomen or pelvis. Appendix appears normal. 3. No evident renal or ureteral calculus on  either side. No hydronephrosis.  12/13/2017 She saw Dr. Rogue Bussing on  with a negative exam and CA 125 8.9.  03/06/2018  She saw Dr. Bailey Mech, GI, who recommended Suggest trial of activated charcoal tablets and LOW FODMAP diet for Small Intestinal Bacterial Overgrowth. Her symptoms have continued to worsen and she is really miserable.   03/14/2018.  Negative exam and normal Ca125 however, GI symptoms concerning for recurrence versus persistent small intestinal bacterial overgrowth syndrome. CT Abdomen Pelvis on 03/22/2018 showed no acute abdominal/pelvic findings, masses, or adenopathy. No worrisome peritoneal or omental implants.   Port was removed by Dr. Lucky Cowboy on 04/26/2018.    CA-125 results postchemotherapy  02/11/2015  10.4 03/12/2015  10.7 06/10/2015  10.0 03/15/2016   10.1 06/09/2016  9.9 09/14/2016  10.9 12/14/2016  9.8 02/17/17  12.1 03/20/2017  9.9 06/04/2017  11.4 09/13/2017  9.5 12/13/2017  8.9 03/14/2018 10.4 06/13/2018 9.2 09/10/2018 9.7 01/04/2019 10.2 05/06/2019 9.0  Genetic testing- [employer] 83 gene Multi-Cancer Panel was negative for pathogenic mutation. She did have a VUS: SDHA c. 155C>T.   Screening mammogram - 12/13/2017 Screening Mammogram- Bi-rads category 1: negative   Problem List: Patient Active Problem List   Diagnosis Date Noted  . Screening mammogram, encounter for 01/07/2019  . History of ovarian cancer 10/03/2018  . Sensation of pressure in bladder area 08/02/2017  . Bloating 08/02/2017  . Menopause 09/14/2016  . Neoplasm, ovary, malignant, unspecified laterality ([place]) 12/09/2015  . Sinus pressure 08/17/2015  . Left flank pain 06/22/2015  . Abdominal tenderness of left lower quadrant 06/22/2015  . Lower abdominal pain 11/26/2014  . Status post laparoscopic hysterectomy 10/29/2014  . Allergic conjunctivitis and rhinitis 07/04/2012  . GERD (gastroesophageal reflux disease) 05/01/2007  . PSORIASIS NEC 12/07/2006    Past Medical History: Past Medical  History:  Diagnosis Date  . Anxiety   . Arthritis   . Benign fundic gland polyps of stomach   . Dizziness    vertigo  . Gallstones   . GERD (gastroesophageal reflux disease)   . Ovarian cancer ([place]) 2016  . Personal history of chemotherapy     Past Surgical History: Past Surgical History:  Procedure Laterality Date  . CESAREAN SECTION    . CHOLECYSTECTOMY    . COLONOSCOPY    . LAPAROSCOPIC HYSTERECTOMY N/A 10/29/2014   Procedure: HYSTERECTOMY TOTAL LAPAROSCOPIC, OMENTUMECTOMY;  Surgeon: Gillis Ends, MD;  Location: [facility] ORS;  Service: Gynecology;  Laterality: N/A;  . LAPAROSCOPIC OOPHERECTOMY    . OOPHORECTOMY    . PORTA CATH REMOVAL N/A 04/26/2018   Procedure: PORTA CATH REMOVAL;  Surgeon: Algernon Huxley, MD;  Location: [facility] [facility];  Service: Cardiovascular;  Laterality: N/A;  . UPPER GI ENDOSCOPY      Family History: Family History  Problem Relation Age of Onset  . Hypertension Mother        49yo   . Hyperlipidemia Mother   . GER disease Mother   . Heart disease Father        19yo  . Breast cancer Sister 31  . Heart disease Paternal Grandmother   . Breast cancer Paternal Grandmother 57       deceased 72  . Kidney disease Neg Hx   . Liver disease Neg Hx   . Colon cancer Neg Hx   . Colon polyps Neg Hx   . Esophageal cancer Neg Hx   . Pancreatic cancer Neg  Hx     Social History: Social History   Socioeconomic History  . Marital status: Divorced    Spouse name: Not on file  . Number of children: 1  . Years of education: Not on file  . Highest education level: Not on file  Occupational History  . Not on file  Tobacco Use  . Smoking status: Former Smoker    Quit date: 04/05/1991    Years since quitting: 28.1  . Smokeless tobacco: Never Used  Substance and Sexual Activity  . Alcohol use: Yes    Alcohol/week: 0.0 standard drinks    Comment: 1 drink of wine daily  . Drug use: No  . Sexual activity: Yes  Other Topics Concern  . Not on file   Social History Narrative  . Not on file   Social Determinants of Health   Financial Resource Strain:   . Difficulty of Paying Living Expenses: Not on file  Food Insecurity:   . Worried About Charity fundraiser in the Last Year: Not on file  . Ran Out of Food in the Last Year: Not on file  Transportation Needs:   . Lack of Transportation (Medical): Not on file  . Lack of Transportation (Non-Medical): Not on file  Physical Activity:   . Days of Exercise per Week: Not on file  . Minutes of Exercise per Session: Not on file  Stress:   . Feeling of Stress : Not on file  Social Connections:   . Frequency of Communication with Friends and Family: Not on file  . Frequency of Social Gatherings with Friends and Family: Not on file  . Attends Religious Services: Not on file  . Active Member of Clubs or Organizations: Not on file  . Attends Archivist Meetings: Not on file  . Marital Status: Not on file  Intimate Partner Violence:   . Fear of Current or Ex-Partner: Not on file  . Emotionally Abused: Not on file  . Physically Abused: Not on file  . Sexually Abused: Not on file    Allergies: Allergies  Allergen Reactions  . Ciprofloxacin Anaphylaxis  . Paclitaxel Shortness Of Breath and Other (See Comments)    Other reaction(s): Tight chest (finding) Chest and facial flushing Flushing, chest tightness, SOB w/ Taxol on 05/26/14 at [facility].  . Flagyl [Metronidazole] Other (See Comments)    headache  . Nutmeg Oil (Myristica Oil) Other (See Comments)    Tightness in chest and throat and difficulty swallowing and breathing.    Current Medications: Current Outpatient Medications  Medication Sig Dispense Refill  . acetaminophen (TYLENOL) 500 MG tablet Take 2 tablets (1,000 mg total) by mouth every 6 (six) hours. 30 tablet 0   No current facility-administered medications for this visit.   Facility-Administered Medications Ordered in Other Visits  Medication Dose  Route Frequency Provider Last Rate Last Admin  . sodium chloride 0.9 % injection 10 mL  10 mL Intravenous PRN Forest Gleason, MD   10 mL at 11/19/14 1628  . sodium chloride flush (NS) 0.9 % injection 10 mL  10 mL Intravenous PRN Cammie Sickle, MD   10 mL at 10/28/15 1135  . sodium chloride flush (NS) 0.9 % injection 10 mL  10 mL Intravenous PRN Cammie Sickle, MD   10 mL at 03/20/17 0831   Review of Systems General:  no complaints Skin: no complaints Eyes: no complaints HEENT: no complaints Breasts: no complaints Pulmonary: no complaints Cardiac: no complaints Gastrointestinal:  no complaints Genitourinary/Sexual: no complaints Ob/Gyn: no complaints Musculoskeletal: no complaints Hematology: no complaints Neurologic/Psych: no complaints   Objective:   Vitals:   05/08/19 1104  BP: 116/81  Pulse: 86  Resp: 18  Temp: 98.8 F (37.1 C)  SpO2: 100%    Body mass index is 24.47 kg/m.  ECOG Performance Status: 0 - Asymptomatic  GENERAL: Patient is a well appearing female in no acute distress HEENT:  Sclera clear. Anicteric NODES:  Negative axillary, supraclavicular, inguinal lymph node survery LUNGS:  Clear to auscultation bilaterally.   HEART:  Regular rate and rhythm.  ABDOMEN:  Soft, nontender.  No hernias, incisions well healed. No masses or ascites EXTREMITIES:  No peripheral edema. Atraumatic. No cyanosis SKIN:  Clear with no obvious rashes or skin changes.  NEURO:  Nonfocal. Well oriented.  Appropriate affect.  Pelvic: exam chaperoned by nurse. Vulva: normal appearing without masses, tenderness or lesions. Vagina: normal appearing; cuff well healed. BME: nontender, no masses. Uterus, Cervix: surgically absent. Rectal: smooth, confirmatory.    Assessment:  Debbie Richardson is a 59 y.o. 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 No evidence of disease on CT or exam and  CA125 normal in March 2019. Interval TLH/Omentectomy 7/16 negative for malignancy. Interval imaging significant for diverticular disease. Persistent pain worked up by GI and thought to be consistent with bacterial overgrowth syndrome and adhesive disease. Prior CT scans and PET all negative for recurrent disease. Port removed. Normal CA-125. GI symptoms improved today. NED on exam today.   Multiple medical complaints and issues improved currently.    Plan:   Problem List Items Addressed This Visit      Endocrine   Neoplasm, ovary, malignant, unspecified laterality ([place]) - Primary     CA 125 normal today. Plan to repeat in 6 months. Will plan for surveillance visit with Dr. [name] in 6 months or sooner if concerning or worsening symptoms or pain. Would consider imaging if increased pain or other concerning symptoms. After that visit plan for annual surveillance. We also recommended she follow up with her mammograms and other primary care.   She will continue to follow up with Dr. Vicente Males for bacterial overgrowth syndrome with Xifaxan.   A total of 25 minutes were spent with the patient/family today; >50% was spent in education, counseling and coordination of care for stage II at least high grade serous ovarian cancer and abdominal pain.  Beckey Rutter, DNP, AGNP-C [place] [facility] [phone] (clinic)  I personally had a face to face interaction and evaluated the patient jointly with the NP, Ms. Beckey Rutter.  I have reviewed her history and available records and have performed the key portions of the physical exam including inguinal lymph node survey, abdominal exam, pelvic exam with my findings confirming those documented above by the APP.  I have discussed the case with the APP and the patient.  I agree with the above documentation, assessment and plan which was fully formulated by me.  Counseling was completed by me.   I personally saw the patient and performed a  substantive portion of this encounter in conjunction with the listed APP as documented above.  Shali Vesey Gaetana Michaelis, MD    CC: Dr. [name], Dr. Vicente Males

## 2019-05-08 NOTE — Progress Notes (Signed)
Pt in for follow up, reports some difficulty with back pain at times.

## 2019-06-21 ENCOUNTER — Encounter: Payer: Self-pay | Admitting: Family Medicine

## 2019-06-21 ENCOUNTER — Other Ambulatory Visit: Payer: Self-pay

## 2019-06-21 ENCOUNTER — Ambulatory Visit: Payer: BC Managed Care – PPO | Admitting: Family Medicine

## 2019-06-21 VITALS — BP 112/72 | HR 92 | Temp 97.7°F | Ht 62.0 in | Wt 134.3 lb

## 2019-06-21 DIAGNOSIS — Z87892 Personal history of anaphylaxis: Secondary | ICD-10-CM | POA: Diagnosis not present

## 2019-06-21 DIAGNOSIS — R140 Abdominal distension (gaseous): Secondary | ICD-10-CM

## 2019-06-21 DIAGNOSIS — Z13220 Encounter for screening for lipoid disorders: Secondary | ICD-10-CM

## 2019-06-21 DIAGNOSIS — C569 Malignant neoplasm of unspecified ovary: Secondary | ICD-10-CM | POA: Diagnosis not present

## 2019-06-21 LAB — LIPID PANEL
Cholesterol: 220 mg/dL — ABNORMAL HIGH (ref 0–200)
HDL: 93.8 mg/dL (ref >39.00)
LDL Cholesterol: 116 mg/dL — ABNORMAL HIGH (ref 0–99)
NonHDL: 126.21
Total CHOL/HDL Ratio: 2
Triglycerides: 49 mg/dL (ref 0.0–149.0)
VLDL: 9.8 mg/dL (ref 0.0–40.0)

## 2019-06-21 LAB — CBC WITH DIFFERENTIAL/PLATELET
Basophils Absolute: 0 10*3/uL (ref 0.0–0.1)
Basophils Relative: 1.3 % (ref 0.0–3.0)
Eosinophils Absolute: 0 10*3/uL (ref 0.0–0.7)
Eosinophils Relative: 1.1 % (ref 0.0–5.0)
HCT: 38 % (ref 36.0–46.0)
Hemoglobin: 13.3 g/dL (ref 12.0–15.0)
Lymphocytes Relative: 40.6 % (ref 12.0–46.0)
Lymphs Abs: 1.5 10*3/uL (ref 0.7–4.0)
MCHC: 35 g/dL (ref 30.0–36.0)
MCV: 92.1 fl (ref 78.0–100.0)
Monocytes Absolute: 0.3 10*3/uL (ref 0.1–1.0)
Monocytes Relative: 9.1 % (ref 3.0–12.0)
Neutro Abs: 1.7 10*3/uL (ref 1.4–7.7)
Neutrophils Relative %: 47.9 % (ref 43.0–77.0)
Platelets: 196 10*3/uL (ref 150.0–400.0)
RBC: 4.13 Mil/uL (ref 3.87–5.11)
RDW: 13 % (ref 11.5–15.5)
WBC: 3.6 10*3/uL — ABNORMAL LOW (ref 4.0–10.5)

## 2019-06-21 LAB — COMPREHENSIVE METABOLIC PANEL
ALT: 16 U/L (ref 0–35)
AST: 17 U/L (ref 0–37)
Albumin: 4.5 g/dL (ref 3.5–5.2)
Alkaline Phosphatase: 44 U/L (ref 39–117)
BUN: 19 mg/dL (ref 6–23)
CO2: 26 mEq/L (ref 19–32)
Calcium: 9.4 mg/dL (ref 8.4–10.5)
Chloride: 103 mEq/L (ref 96–112)
Creatinine, Ser: 0.69 mg/dL (ref 0.40–1.20)
GFR: 87 mL/min (ref >60.00)
Glucose, Bld: 75 mg/dL (ref 70–99)
Potassium: 3.9 mEq/L (ref 3.5–5.1)
Sodium: 137 mEq/L (ref 135–145)
Total Bilirubin: 0.8 mg/dL (ref 0.2–1.2)
Total Protein: 7.1 g/dL (ref 6.0–8.3)

## 2019-06-21 LAB — TSH: TSH: 2.09 u[IU]/mL (ref 0.35–4.50)

## 2019-06-21 NOTE — Progress Notes (Signed)
Subjective:    Patient ID: Debbie Richardson, female    DOB: 08/11/1960, 59 y.o.   MRN: [id number]  This visit occurred during the SARS-CoV-2 public health emergency.  Safety protocols were in place, including screening questions prior to the visit, additional usage of staff PPE, and extensive cleaning of exam room while observing appropriate contact time as indicated for disinfecting solutions.    HPI Pt presents to discuss getting lab work  [employer] from Last 3 Encounters:  06/21/19 134 lb 5 oz (60.9 kg)  05/08/19 133 lb 12.8 oz (60.7 kg)  10/03/18 128 lb 1 oz (58.1 kg)  during the pandemic- diet and exercise are not good  Feels the need to get routine health care   24.57 kg/m   Working a lot lately- her company is doing well   She is past routine blood work from oncology  Is interested in some screening   She gets regular internal /pelvic exam -exams will be spread to yearly  Gets mammograms at [facility] - will schedule  No lumps on self exam   Did not get flu shot  Is planning to get the covid vaccine (she has h/o anaphylaxis with food so will be cautious)    Wants to screen cholesterol  Not interested in HIV or Hep C screening -very low risk    BP Readings from Last 3 Encounters:  06/21/19 112/72  05/08/19 116/81  10/03/18 123/83    Patient Active Problem List   Diagnosis Date Noted  . Lipid screening 06/21/2019  . History of anaphylaxis 06/21/2019  . Screening mammogram, encounter for 01/07/2019  . History of ovarian cancer 10/03/2018  . Bloating 08/02/2017  . Menopause 09/14/2016  . Neoplasm, ovary, malignant, unspecified laterality ([place]) 12/09/2015  . Left flank pain 06/22/2015  . Abdominal tenderness of left lower quadrant 06/22/2015  . Lower abdominal pain 11/26/2014  . Status post laparoscopic hysterectomy 10/29/2014  . Allergic conjunctivitis and rhinitis 07/04/2012  . GERD (gastroesophageal reflux disease) 05/01/2007  . PSORIASIS NEC  12/07/2006   Past Medical History:  Diagnosis Date  . Anxiety   . Arthritis   . Benign fundic gland polyps of stomach   . Dizziness    vertigo  . Gallstones   . GERD (gastroesophageal reflux disease)   . Ovarian cancer ([place]) 2016  . Personal history of chemotherapy    Past Surgical History:  Procedure Laterality Date  . CESAREAN SECTION    . CHOLECYSTECTOMY    . COLONOSCOPY    . LAPAROSCOPIC HYSTERECTOMY N/A 10/29/2014   Procedure: HYSTERECTOMY TOTAL LAPAROSCOPIC, OMENTUMECTOMY;  Surgeon: Gillis Ends, MD;  Location: [facility] ORS;  Service: Gynecology;  Laterality: N/A;  . LAPAROSCOPIC OOPHERECTOMY    . OOPHORECTOMY    . PORTA CATH REMOVAL N/A 04/26/2018   Procedure: PORTA CATH REMOVAL;  Surgeon: Algernon Huxley, MD;  Location: [place] [facility];  Service: Cardiovascular;  Laterality: N/A;  . UPPER GI ENDOSCOPY     Social History   Tobacco Use  . Smoking status: Former Smoker    Quit date: 04/05/1991    Years since quitting: 28.2  . Smokeless tobacco: Never Used  Substance Use Topics  . Alcohol use: Yes    Alcohol/week: 0.0 standard drinks    Comment: 1 drink of wine daily  . Drug use: No   Family History  Problem Relation Age of Onset  . Hypertension Mother        48yo   . Hyperlipidemia Mother   .  GER disease Mother   . Heart disease Father        63yo  . Breast cancer Sister 22  . Heart disease Paternal Grandmother   . Breast cancer Paternal Grandmother 68       deceased 60  . Kidney disease Neg Hx   . Liver disease Neg Hx   . Colon cancer Neg Hx   . Colon polyps Neg Hx   . Esophageal cancer Neg Hx   . Pancreatic cancer Neg Hx    Allergies  Allergen Reactions  . Ciprofloxacin Anaphylaxis  . Paclitaxel Shortness Of Breath and Other (See Comments)    Other reaction(s): Tight chest (finding) Chest and facial flushing Flushing, chest tightness, SOB w/ Taxol on 05/26/14 at [place].  . Flagyl [Metronidazole] Other (See Comments)    headache  .  Nutmeg Oil (Myristica Oil) Other (See Comments)    Tightness in chest and throat and difficulty swallowing and breathing.   Current Outpatient Medications on File Prior to Visit  Medication Sig Dispense Refill  . acetaminophen (TYLENOL) 500 MG tablet Take 2 tablets (1,000 mg total) by mouth every 6 (six) hours. 30 tablet 0  . omeprazole (PRILOSEC) 20 MG capsule Take 20 mg by mouth daily.    . rifaximin (XIFAXAN) 550 MG TABS tablet Take 550 mg by mouth 2 (two) times daily.     Current Facility-Administered Medications on File Prior to Visit  Medication Dose Route Frequency Provider Last Rate Last Admin  . sodium chloride 0.9 % injection 10 mL  10 mL Intravenous PRN Forest Gleason, MD   10 mL at 11/19/14 1628  . sodium chloride flush (NS) 0.9 % injection 10 mL  10 mL Intravenous PRN Cammie Sickle, MD   10 mL at 10/28/15 1135  . sodium chloride flush (NS) 0.9 % injection 10 mL  10 mL Intravenous PRN Cammie Sickle, MD   10 mL at 03/20/17 0831     Review of Systems  Constitutional: Negative for activity change, appetite change, fatigue, fever and unexpected weight change.  HENT: Negative for congestion, ear pain, rhinorrhea, sinus pressure and sore throat.   Eyes: Negative for pain, redness and visual disturbance.  Respiratory: Negative for cough, shortness of breath and wheezing.   Cardiovascular: Negative for chest pain and palpitations.  Gastrointestinal: Negative for abdominal pain, blood in stool, constipation and diarrhea.       GI bloating intermittent  Endocrine: Negative for polydipsia and polyuria.  Genitourinary: Negative for dysuria, frequency and urgency.  Musculoskeletal: Negative for arthralgias, back pain and myalgias.  Skin: Negative for pallor and rash.  Allergic/Immunologic: Negative for environmental allergies.  Neurological: Negative for dizziness, syncope and headaches.  Hematological: Negative for adenopathy. Does not bruise/bleed easily.    Psychiatric/Behavioral: Negative for decreased concentration and dysphoric mood. The patient is not nervous/anxious.        Objective:   Physical Exam Constitutional:      General: She is not in acute distress.    Appearance: Normal appearance. She is well-developed and normal weight. She is not ill-appearing.  HENT:     Head: Normocephalic and atraumatic.     Mouth/Throat:     Mouth: Mucous membranes are moist.  Eyes:     General: No scleral icterus.    Conjunctiva/sclera: Conjunctivae normal.     Pupils: Pupils are equal, round, and reactive to light.  Neck:     Thyroid: No thyromegaly.     Vascular: No carotid bruit or  JVD.  Cardiovascular:     Rate and Rhythm: Normal rate and regular rhythm.     Heart sounds: Normal heart sounds. No gallop.   Pulmonary:     Effort: Pulmonary effort is normal. No respiratory distress.     Breath sounds: Normal breath sounds. No wheezing or rales.  Abdominal:     General: Bowel sounds are normal. There is no distension or abdominal bruit.     Palpations: Abdomen is soft. There is no mass.     Tenderness: There is no abdominal tenderness. There is no guarding or rebound.     Hernia: No hernia is present.  Musculoskeletal:     Cervical back: Normal range of motion and neck supple.     Right lower leg: No edema.     Left lower leg: No edema.  Lymphadenopathy:     Cervical: No cervical adenopathy.  Skin:    General: Skin is warm and dry.     Coloration: Skin is not pale.     Findings: No rash.  Neurological:     Mental Status: She is alert.     Coordination: Coordination normal.     Deep Tendon Reflexes: Reflexes are normal and symmetric. Reflexes normal.  Psychiatric:        Mood and Affect: Mood normal.           Assessment & Plan:   Problem List Items Addressed This Visit      Endocrine   Neoplasm, ovary, malignant, unspecified laterality ([place])    S/p treatment doing well  Continues oncology f/u      Relevant Orders    CBC with Differential/Platelet (Completed)   Comprehensive metabolic panel (Completed)   TSH (Completed)     Other   Bloating    Pt has struggled with GI bloating since tx for ovarian cancer  Thought to be due to small bowel overgrowth which she tx with xifaxan       Lipid screening - Primary    Due for labs Healthy diet  Disc goals for lipids and reasons to control them Rev last labs with pt Rev low sat fat diet in detail  New lipid panel drawn      Relevant Orders   Lipid panel (Completed)   History of anaphylaxis    Interested in allergy w/u  Thinks she may be allergic to nutmeg oil -anaphylaxis Ref done        Relevant Orders   Ambulatory referral to Allergy

## 2019-06-21 NOTE — Patient Instructions (Addendum)
Don't forget to make your mammogram appt  Get covid vaccines first   After that consider shingles vaccine If you are interested in the shingles vaccine series (Shingrix), call your insurance or pharmacy to check on coverage and location it must be given.  If affordable - you can schedule it here or at your pharmacy depending on coverage    Labs today   We will send for pap report   Make plan for exercise (in and outside)   Our office will call you to set up an allergist visit in the next 1-2 weeks

## 2019-06-23 NOTE — Assessment & Plan Note (Signed)
Pt has struggled with GI bloating since tx for ovarian cancer  Thought to be due to small bowel overgrowth which she tx with xifaxan

## 2019-06-23 NOTE — Assessment & Plan Note (Signed)
S/p treatment doing well  Continues oncology f/u

## 2019-06-23 NOTE — Assessment & Plan Note (Signed)
Interested in allergy w/u  Thinks she may be allergic to nutmeg oil -anaphylaxis Ref done

## 2019-06-23 NOTE — Assessment & Plan Note (Signed)
Due for labs Healthy diet  Disc goals for lipids and reasons to control them Rev last labs with pt Rev low sat fat diet in detail  New lipid panel drawn

## 2019-07-16 ENCOUNTER — Other Ambulatory Visit: Payer: Self-pay

## 2019-07-16 ENCOUNTER — Telehealth: Payer: Self-pay | Admitting: Family Medicine

## 2019-07-16 ENCOUNTER — Ambulatory Visit (INDEPENDENT_AMBULATORY_CARE_PROVIDER_SITE_OTHER): Payer: 59 | Admitting: Family Medicine

## 2019-07-16 ENCOUNTER — Encounter: Payer: Self-pay | Admitting: Family Medicine

## 2019-07-16 VITALS — BP 116/78 | HR 76 | Temp 97.4°F | Ht 62.0 in | Wt 134.6 lb

## 2019-07-16 DIAGNOSIS — R42 Dizziness and giddiness: Secondary | ICD-10-CM

## 2019-07-16 MED ORDER — MECLIZINE HCL 25 MG PO TABS: 25.0000 mg | ORAL_TABLET | Freq: Three times a day (TID) | ORAL | 0 refills | Status: DC | PRN

## 2019-07-16 NOTE — Telephone Encounter (Signed)
I pended a px for meclizine to try if she wants to while she is waiting for appt.  Caution/it is sedating  Send to pharmacy if she wants to try it

## 2019-07-16 NOTE — Telephone Encounter (Signed)
Pt declined to try the meclizine she said she just wants Dr. Glori Bickers to check her out before she tries any meds

## 2019-07-16 NOTE — Assessment & Plan Note (Signed)
Reassuring exam and no headache now  Possibly vaccine related or multi factorial with weather change and allergies (she has had before) Px meclizine to use for the next few days inst to update if no further improvement  If symptoms worsen or become severe- ER recommended

## 2019-07-16 NOTE — Telephone Encounter (Signed)
Pt calling requesting an OV today for vertigo Patient having vertigo(dizziness), distorted vision and "spinning" x 4 days, progressively worsening.  Pt states that she go there Covid vaccine at the end of the week last week and symptoms sat in shortly after.  Pt has been having ear popping so she has been taking decongestants for this with little relief of the vertigo symptoms.   Spoke with Dr Glori Bickers and she is going to work her in at 4:30 today.  Pt aware that she needs someone to drive her for safety reasons and for treatment reasons if she receives any medications that may make her drowsy. Pt agreed to this and will have someone drive her.   Will send Phone note to Dr Glori Bickers as requested.

## 2019-07-16 NOTE — Progress Notes (Signed)
Subjective:    Patient ID: Debbie Richardson, female    DOB: [birth year], 59 y.o.   MRN: [id number]  This visit occurred during the SARS-CoV-2 public health emergency.  Safety protocols were in place, including screening questions prior to the visit, additional usage of staff PPE, and extensive cleaning of exam room while observing appropriate contact time as indicated for disinfecting solutions.    HPI Pt presents with dizziness and vision change   She had first pfizer covid vaccine on 4/9   Wt Readings from Last 3 Encounters:  07/16/19 134 lb 9 oz (61 kg)  06/21/19 134 lb 5 oz (60.9 kg)  05/08/19 133 lb 12.8 oz (60.7 kg)   24.61 kg/m   Vertigo  Has had in the past-less severe and epeley maneuver usually helps  (this time not)  Declined mecilzine   Had the vaccine Friday - did fine and went to bed  Saturday -woke up early am- noted dizziness and dull headache and she was tired more than usual  Room felt like it was spinning  Mild and then a bit worse as the day went on - she tried not to move as much  Then storms came through and ears popped and she was a little nauseated   Worse on R side overall  A little allergy congestion  Decongestant opens ears up (no pseudo ephedrine)   Patient Active Problem List   Diagnosis Date Noted  . Lipid screening 06/21/2019  . History of anaphylaxis 06/21/2019  . Screening mammogram, encounter for 01/07/2019  . History of ovarian cancer 10/03/2018  . Bloating 08/02/2017  . Menopause 09/14/2016  . 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 Left flank pain 06/22/2015  . Abdominal tenderness of left lower quadrant 06/22/2015  . Lower abdominal pain 11/26/2014  . Status post laparoscopic hysterectomy 10/29/2014  . Allergic conjunctivitis and rhinitis 07/04/2012  . GERD (gastroesophageal reflux disease) 05/01/2007  . PSORIASIS NEC 12/07/2006  . Vertigo 08/25/2006   Past Medical History:  Diagnosis Date  .  Anxiety   . Arthritis   . Benign fundic gland polyps of stomach   . Dizziness    vertigo  . Gallstones   . GERD (gastroesophageal reflux disease)   . Ovarian cancer ([place]) 2016  . Personal history of chemotherapy    Past Surgical History:  Procedure Laterality Date  . CESAREAN SECTION    . CHOLECYSTECTOMY    . COLONOSCOPY    . LAPAROSCOPIC HYSTERECTOMY N/A 10/29/2014   Procedure: HYSTERECTOMY TOTAL LAPAROSCOPIC, OMENTUMECTOMY;  Surgeon: Gillis Ends, MD;  Location: [facility];  Service: Gynecology;  Laterality: N/A;  . LAPAROSCOPIC OOPHERECTOMY    . OOPHORECTOMY    . PORTA CATH REMOVAL N/A 04/26/2018   Procedure: PORTA CATH REMOVAL;  Surgeon: Algernon Huxley, MD;  Location: [facility];  Service: Cardiovascular;  Laterality: N/A;  . UPPER GI ENDOSCOPY     Social History   Tobacco Use  . Smoking status: Former Smoker    Quit date: 04/05/1991    Years since quitting: 28.2  . Smokeless tobacco: Never Used  Substance Use Topics  . Alcohol use: Yes    Alcohol/week: 0.0 standard drinks    Comment: 1 drink of wine daily  . Drug use: No   Family History  Problem Relation Age of Onset  . Hypertension Mother        73yo   . Hyperlipidemia Mother   . GER disease Mother   .  Heart disease Father        33yo  . Breast cancer Sister 29  . Heart disease Paternal Grandmother   . Breast cancer Paternal Grandmother 14       deceased 55  . Kidney disease Neg Hx   . Liver disease Neg Hx   . Colon cancer Neg Hx   . Colon polyps Neg Hx   . Esophageal cancer Neg Hx   . Pancreatic cancer Neg Hx    Allergies  Allergen Reactions  . Ciprofloxacin Anaphylaxis  . Paclitaxel Shortness Of Breath and Other (See Comments)    Other reaction(s): Tight chest (finding) Chest and facial flushing Flushing, chest tightness, SOB w/ Taxol on 05/26/14 at [place].  . Flagyl [Metronidazole] Other (See Comments)    headache  . Nutmeg Oil (Myristica Oil) Other (See Comments)     Tightness in chest and throat and difficulty swallowing and breathing.   Current Outpatient Medications on File Prior to Visit  Medication Sig Dispense Refill  . acetaminophen (TYLENOL) 500 MG tablet Take 2 tablets (1,000 mg total) by mouth every 6 (six) hours. 30 tablet 0  . omeprazole (PRILOSEC) 20 MG capsule Take 20 mg by mouth daily.    . rifaximin (XIFAXAN) 550 MG TABS tablet Take 550 mg by mouth 2 (two) times daily.     Current Facility-Administered Medications on File Prior to Visit  Medication Dose Route Frequency Provider Last Rate Last Admin  . sodium chloride 0.9 % injection 10 mL  10 mL Intravenous PRN Forest Gleason, MD   10 mL at 11/19/14 1628  . sodium chloride flush (NS) 0.9 % injection 10 mL  10 mL Intravenous PRN Cammie Sickle, MD   10 mL at 10/28/15 1135  . sodium chloride flush (NS) 0.9 % injection 10 mL  10 mL Intravenous PRN Cammie Sickle, MD   10 mL at 03/20/17 0831     Review of Systems  Constitutional: Negative for activity change, appetite change, fatigue, fever and unexpected weight change.  HENT: Negative for congestion, ear pain, rhinorrhea, sinus pressure and sore throat.   Eyes: Negative for pain, redness and visual disturbance.       Vision is better  Respiratory: Negative for cough, shortness of breath and wheezing.   Cardiovascular: Negative for chest pain and palpitations.  Gastrointestinal: Negative for abdominal pain, blood in stool, constipation and diarrhea.  Endocrine: Negative for polydipsia and polyuria.  Genitourinary: Negative for dysuria, frequency and urgency.  Musculoskeletal: Negative for arthralgias, back pain and myalgias.  Skin: Negative for pallor and rash.  Allergic/Immunologic: Negative for environmental allergies.  Neurological: Positive for dizziness. Negative for tremors, seizures, syncope, facial asymmetry, speech difficulty, weakness, light-headedness, numbness and headaches.       Headache is better    Hematological: Negative for adenopathy. Does not bruise/bleed easily.  Psychiatric/Behavioral: Negative for decreased concentration and dysphoric mood. The patient is not nervous/anxious.        Objective:   Physical Exam Constitutional:      General: She is not in acute distress.    Appearance: She is well-developed and normal weight.  HENT:     Head: Normocephalic and atraumatic.     Right Ear: Tympanic membrane, ear canal and external ear normal.     Left Ear: Tympanic membrane, ear canal and external ear normal.     Nose: Congestion present.     Mouth/Throat:     Mouth: Mucous membranes are moist.  Pharynx: Oropharynx is clear.  Eyes:     Conjunctiva/sclera: Conjunctivae normal.     Pupils: Pupils are equal, round, and reactive to light.     Comments: 2-3 beats of horizontal nystagmus bilaterally  Neck:     Thyroid: No thyromegaly.     Vascular: No carotid bruit or JVD.  Cardiovascular:     Rate and Rhythm: Normal rate and regular rhythm.     Heart sounds: Normal heart sounds. No gallop.   Pulmonary:     Effort: Pulmonary effort is normal. No respiratory distress.     Breath sounds: Normal breath sounds. No wheezing or rales.  Abdominal:     General: Bowel sounds are normal. There is no distension or abdominal bruit.     Palpations: Abdomen is soft. There is no mass.     Tenderness: There is no abdominal tenderness.  Musculoskeletal:     Cervical back: Normal range of motion and neck supple.  Lymphadenopathy:     Cervical: No cervical adenopathy.  Skin:    General: Skin is warm and dry.     Findings: No rash.  Neurological:     General: No focal deficit present.     Mental Status: She is alert.     Cranial Nerves: No cranial nerve deficit.     Sensory: No sensory deficit.     Motor: No weakness.     Coordination: Coordination normal.     Gait: Gait normal.     Deep Tendon Reflexes: Reflexes are normal and symmetric. Reflexes normal.  Psychiatric:         Mood and Affect: Mood normal.           Assessment & Plan:   Problem List Items Addressed This Visit      Other   Vertigo - Primary    Reassuring exam and no headache now  Possibly vaccine related or multi factorial with weather change and allergies (she has had before) Px meclizine to use for the next few days inst to update if no further improvement  If symptoms worsen or become severe- ER recommended

## 2019-07-16 NOTE — Addendum Note (Signed)
Addended by: Loura Pardon A on: 07/16/2019 01:35 PM   Modules accepted: Orders

## 2019-07-16 NOTE — Patient Instructions (Addendum)
Try the meclizine and watch out for sedation  You can try the epely maneuver again once you feel more steady   If headache or any other new symptoms-please let us know  Update if not starting to improve in a week or if worsening    Stay hydrated  Do not move too quickly- take your time

## 2019-10-01 ENCOUNTER — Other Ambulatory Visit: Payer: Self-pay

## 2019-10-01 MED ORDER — RIFAXIMIN 550 MG PO TABS: 550.0000 mg | ORAL_TABLET | Freq: Three times a day (TID) | ORAL | 0 refills | Status: AC

## 2019-10-03 ENCOUNTER — Other Ambulatory Visit: Payer: Self-pay

## 2019-10-03 ENCOUNTER — Telehealth: Payer: Self-pay

## 2019-10-03 MED ORDER — AMOXICILLIN-POT CLAVULANATE 875-125 MG PO TABS: 1.0000 | ORAL_TABLET | Freq: Two times a day (BID) | ORAL | 0 refills | Status: AC

## 2019-10-03 NOTE — Telephone Encounter (Signed)
Option is Augmentin 10 days 875 mg BID

## 2019-10-03 NOTE — Telephone Encounter (Signed)
Pt has been notified and agrees. 

## 2019-10-03 NOTE — Telephone Encounter (Signed)
Pt called to request Xifaxan samples but was informed that we are out of samples so pt requested a prescription but her insurance denied coverage of the Xifaxan. Pt is now requesting an alternative antibiotic.

## 2019-10-15 ENCOUNTER — Telehealth: Payer: Self-pay | Admitting: Gastroenterology

## 2019-10-15 NOTE — Telephone Encounter (Signed)
Left vm for patient about vm she left [place] on 7.13.21. Pt wanting to schedule appt. Dr. Vicente Males pt.  FYI

## 2019-10-17 NOTE — Telephone Encounter (Signed)
Patient states she is done taking her round of medication Dr. Vicente Males prescribed, and it did not work bc she is still having symptoms. Pt is wanting to be seen sooner than Oct and wanting to know what next steps are. Please advise and call pt.

## 2019-10-17 NOTE — Telephone Encounter (Signed)
Called pt to offer her a sooner appointment with Dr. Vicente Males.  Unable to contact, LVM to return call

## 2019-10-18 ENCOUNTER — Ambulatory Visit
Admission: RE | Admit: 2019-10-18 | Discharge: 2019-10-18 | Disposition: A | Discharge status: Home / Self Care | Payer: 59 | Source: Ambulatory Visit | Attending: Family Medicine | Admitting: Family Medicine

## 2019-10-18 DIAGNOSIS — Z1231 Encounter for screening mammogram for malignant neoplasm of breast: Secondary | ICD-10-CM

## 2019-11-05 ENCOUNTER — Inpatient Hospital Stay: Payer: 59 | Attending: Obstetrics and Gynecology

## 2019-11-05 ENCOUNTER — Other Ambulatory Visit: Payer: Self-pay

## 2019-11-05 DIAGNOSIS — Z8543 Personal history of malignant neoplasm of ovary: Secondary | ICD-10-CM | POA: Diagnosis present

## 2019-11-05 DIAGNOSIS — Z9221 Personal history of antineoplastic chemotherapy: Secondary | ICD-10-CM | POA: Insufficient documentation

## 2019-11-05 DIAGNOSIS — Z90722 Acquired absence of ovaries, bilateral: Secondary | ICD-10-CM | POA: Insufficient documentation

## 2019-11-05 DIAGNOSIS — C569 Malignant neoplasm of unspecified ovary: Secondary | ICD-10-CM

## 2019-11-05 DIAGNOSIS — Z87891 Personal history of nicotine dependence: Secondary | ICD-10-CM | POA: Diagnosis not present

## 2019-11-05 DIAGNOSIS — Z08 Encounter for follow-up examination after completed treatment for malignant neoplasm: Secondary | ICD-10-CM | POA: Diagnosis present

## 2019-11-05 DIAGNOSIS — Z90710 Acquired absence of both cervix and uterus: Secondary | ICD-10-CM | POA: Diagnosis not present

## 2019-11-06 LAB — CA 125: Cancer Antigen (CA) 125: 10 U/mL (ref 0.0–38.1)

## 2019-11-13 ENCOUNTER — Other Ambulatory Visit: Payer: Self-pay

## 2019-11-13 ENCOUNTER — Inpatient Hospital Stay (HOSPITAL_BASED_OUTPATIENT_CLINIC_OR_DEPARTMENT_OTHER): Payer: 59 | Admitting: Obstetrics and Gynecology

## 2019-11-13 VITALS — BP 130/86 | HR 84 | Temp 98.2°F | Wt 136.4 lb

## 2019-11-13 DIAGNOSIS — Z9221 Personal history of antineoplastic chemotherapy: Secondary | ICD-10-CM

## 2019-11-13 DIAGNOSIS — C569 Malignant neoplasm of unspecified ovary: Secondary | ICD-10-CM

## 2019-11-13 DIAGNOSIS — Z08 Encounter for follow-up examination after completed treatment for malignant neoplasm: Secondary | ICD-10-CM | POA: Diagnosis not present

## 2019-11-13 DIAGNOSIS — Z8543 Personal history of malignant neoplasm of ovary: Secondary | ICD-10-CM | POA: Diagnosis not present

## 2019-11-13 DIAGNOSIS — Z90722 Acquired absence of ovaries, bilateral: Secondary | ICD-10-CM

## 2019-11-13 DIAGNOSIS — Z90710 Acquired absence of both cervix and uterus: Secondary | ICD-10-CM | POA: Diagnosis not present

## 2019-11-13 NOTE — Progress Notes (Signed)
Gynecologic Oncology Interval Note  Referring Provider: Dr. 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 She is on control arm of GOG225 and completed the active portion of the study on 03/15/17.   Last seen 05/08/19 with negative exam and normal CA 125. Last mammogram 10/18/19 reported as birads category 1: negative. Last colonoscopy in 2016 with recommendation to repeat in 2026.  CA-125 has been followed. Pre-operative CA-125 was 45.  11/05/19  10 05/06/19  9.0 01/04/2019 10.2 09/10/2018 9.7 06/13/2018 9.2  Today, she feels well. Continues to have some bacterial overgrowth syndrome but not bothersome.  She feels well and denies changes in bowel movements, pelvic pain, bleeding or discharge.   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 See prior notes for complete details. She was taken emergently to the operating room on 04/28/2014 by Dr. Glennon Mac and a laparoscopic procedure was performed.  Bilateral salpingo-oophorectomy with  placement of the cystic lesions in the Endo Catch bag and removal through the LLQ port. Per the operative note the right ovary did rupture and contents of the cysts were spilled into the pelvis.  The final pathology revealed high-grade serous adenocarcinoma involving both ovaries and fallopian tubes. Washings also revealed clusters of highly atypical glandular cells and surface involvement of the ovary was present. Staging was felt to be stage II at least high grade serous ovarian cancer. Preoperative CA-125 was 45.   She received 6 cycles of carboplatin/taxane chemotherapy via an IV port, last treatment June 2016.  She had an allergy to taxol and was  switched to taxotere for the last 4 cycles. She underwent interval TLH/omentectomy on 10/29/2014 for ovarian cancer. Her final pathology was negative. She enrolled on GOG225.   CT scan 01/09/2015 negative  CT scan 06/24/2015 IMPRESSION: No evidence of urolithiasis, hydronephrosis, or other acute Findings. Colonic diverticulosis. No radiographic evidence of diverticulitis.  CT scan of abdomen and pelvis 12/09/2015 IMPRESSION: Status post hysterectomy and bilateral salpingo-oophorectomy. No evidence of recurrent or metastatic disease  PET scan 09/19/2016 IMPRESSION: Negative PET-CT.  No findings for hypermetabolic recurrent tumor.  CT scan of abdomen and pelvis 02/27/2017  IMPRESSION: 1. No acute findings are noted in the abdomen or pelvis to account for the patient's symptoms. Notably, there is a low stool burden despite the patient's reported history of constipation. 2. Colonic diverticulosis without evidence of acute diverticulitis at this time. 3. Additional incidental findings, as above.   She had menopausal symptoms and we discussed Effexor therapy, but she did not pursue. She has used magnesium and felt that improved her symptoms.   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 Bailey Mech, GI. She has been on multiple antibiotics and Xifaxan with complete resolution of symptoms. But recurrent symptoms.    09/22/2017 CT scan A/P  IMPRESSION: 1. Uterus and ovaries absent. No pelvic mass. No adenopathy. No a mental lesions. No liver lesions evident. 2. No appreciable bowel wall thickening or bowel obstruction. There are occasional sigmoid diverticula without diverticulitis. No abscess in the abdomen or pelvis. Appendix appears normal. 3. No evident renal or ureteral calculus on either side. No hydronephrosis.  12/13/2017 She saw Dr. Rogue Bussing  on  with a negative exam and CA 125 8.9.  03/06/2018  She saw Dr. Bailey Mech,  GI, who recommended Suggest trial of activated charcoal tablets and LOW FODMAP diet for Small Intestinal Bacterial Overgrowth. Her symptoms have continued to worsen and she is really miserable.   03/14/2018.  Negative exam and normal Ca125 however, GI symptoms concerning for recurrence versus persistent small intestinal bacterial overgrowth syndrome. CT Abdomen Pelvis on 03/22/2018 showed no acute abdominal/pelvic findings, masses, or adenopathy. No worrisome peritoneal or omental implants.   Port was removed by Dr. Lucky Cowboy on 04/26/2018.    seen in July 2020 with negative exam and normal CA 125. GI symptoms at that time were improved. She has history of small intestinal bacterial overgrowth syndrome with previous negative imaging in December 2019.   CA-125 results postchemotherapy 02/11/2015  10.4 03/12/2015  10.7 06/10/2015  10.0 03/15/2016   10.1 06/09/2016  9.9 09/14/2016  10.9 12/14/2016  9.8 02/17/17  12.1 03/20/2017  9.9 06/04/2017  11.4 09/13/2017  9.5 12/13/2017  8.9 03/14/2018 10.4 06/13/2018 9.2 09/10/2018 9.7 01/04/2019 10.2 05/06/2019 9.0  Genetic testing- [employer]ae 83 gene Multi-Cancer Panel was negative for pathogenic mutation. She did have a VUS: SDHA c. 155C>T.   Screening mammogram - 12/13/2017 Screening Mammogram- Bi-rads category 1: negative   Problem List: Patient Active Problem List   Diagnosis Date Noted  . Lipid screening 06/21/2019  . History of anaphylaxis 06/21/2019  . Screening mammogram, encounter for 01/07/2019  . History of ovarian cancer 10/03/2018  . Bloating 08/02/2017  . Menopause 09/14/2016  . Neoplasm, ovary, malignant, unspecified laterality ([place]) 12/09/2015  . Left flank pain 06/22/2015  . Abdominal tenderness of left lower quadrant 06/22/2015  . Lower abdominal pain 11/26/2014  . Status post laparoscopic hysterectomy 10/29/2014  . Allergic conjunctivitis and rhinitis 07/04/2012  . GERD (gastroesophageal reflux disease) 05/01/2007  . PSORIASIS NEC  12/07/2006  . Vertigo 08/25/2006    Past Medical History: Past Medical History:  Diagnosis Date  . Anxiety   . Arthritis   . Benign fundic gland polyps of stomach   . Dizziness    vertigo  . Gallstones   . GERD (gastroesophageal reflux disease)   . Ovarian cancer ([place]) 2016  . Personal history of chemotherapy     Past Surgical History: Past Surgical History:  Procedure Laterality Date  . CESAREAN SECTION    . CHOLECYSTECTOMY    . COLONOSCOPY    . LAPAROSCOPIC HYSTERECTOMY N/A 10/29/2014   Procedure: HYSTERECTOMY TOTAL LAPAROSCOPIC, OMENTUMECTOMY;  Surgeon: Gillis Ends, MD;  Location: [facility] ORS;  Service: Gynecology;  Laterality: N/A;  . LAPAROSCOPIC OOPHERECTOMY    . OOPHORECTOMY    . PORTA CATH REMOVAL N/A 04/26/2018   Procedure: PORTA CATH REMOVAL;  Surgeon: Algernon Huxley, MD;  Location: [facility];  Service: Cardiovascular;  Laterality: N/A;  . UPPER GI ENDOSCOPY      Family History: Family History  Problem Relation Age of Onset  . Hypertension Mother        34yo   . Hyperlipidemia Mother   . GER disease Mother   . Heart disease Father        12yo  . Breast cancer Sister 29  . Heart disease Paternal Grandmother   . Breast cancer Paternal Grandmother 27       deceased 56  . Kidney disease Neg Hx   . Liver disease Neg Hx   . Colon cancer Neg Hx   . Colon polyps Neg Hx   .  Esophageal cancer Neg Hx   . Pancreatic cancer Neg Hx     Social History: Social History   Socioeconomic History  . Marital status: Divorced    Spouse name: Not on file  . Number of children: 1  . Years of education: Not on file  . Highest education level: Not on file  Occupational History  . Not on file  Tobacco Use  . Smoking status: Former Smoker    Quit date: 04/05/1991    Years since quitting: 28.6  . Smokeless tobacco: Never Used  Vaping Use  . Vaping Use: Never used  Substance and Sexual Activity  . Alcohol use: Yes    Alcohol/week: 0.0 standard drinks     Comment: 1 drink of wine daily  . Drug use: No  . Sexual activity: Yes  Other Topics Concern  . Not on file  Social History Narrative  . Not on file   Social Determinants of Health   Financial Resource Strain:   . Difficulty of Paying Living Expenses:   Food Insecurity:   . Worried About Charity fundraiser in the Last Year:   . Arboriculturist in the Last Year:   Transportation Needs:   . Film/video editor (Medical):   Marland Kitchen Lack of Transportation (Non-Medical):   Physical Activity:   . Days of Exercise per Week:   . Minutes of Exercise per Session:   Stress:   . Feeling of Stress :   Social Connections:   . Frequency of Communication with Friends and Family:   . Frequency of Social Gatherings with Friends and Family:   . Attends Religious Services:   . Active Member of Clubs or Organizations:   . Attends Archivist Meetings:   Marland Kitchen Marital Status:   Intimate Partner Violence:   . Fear of Current or Ex-Partner:   . Emotionally Abused:   Marland Kitchen Physically Abused:   . Sexually Abused:     Allergies: Allergies  Allergen Reactions  . Ciprofloxacin Anaphylaxis  . Paclitaxel Shortness Of Breath and Other (See Comments)    Other reaction(s): Tight chest (finding) Chest and facial flushing Flushing, chest tightness, SOB w/ Taxol on 05/26/14 at [facility].  . Flagyl [Metronidazole] Other (See Comments)    headache  . Nutmeg Oil (Myristica Oil) Other (See Comments)    Tightness in chest and throat and difficulty swallowing and breathing.    Current Medications: Current Outpatient Medications  Medication Sig Dispense Refill  . acetaminophen (TYLENOL) 500 MG tablet Take 2 tablets (1,000 mg total) by mouth every 6 (six) hours. 30 tablet 0  . meclizine (ANTIVERT) 25 MG tablet Take 1 tablet (25 mg total) by mouth 3 (three) times daily as needed for dizziness. Caution of sedation 30 tablet 0  . omeprazole (PRILOSEC) 20 MG capsule Take 20 mg by mouth daily.     No  current facility-administered medications for this visit.   Facility-Administered Medications Ordered in Other Visits  Medication Dose Route Frequency Provider Last Rate Last Admin  . sodium chloride 0.9 % injection 10 mL  10 mL Intravenous PRN Forest Gleason, MD   10 mL at 11/19/14 1628  . sodium chloride flush (NS) 0.9 % injection 10 mL  10 mL Intravenous PRN Cammie Sickle, MD   10 mL at 10/28/15 1135  . sodium chloride flush (NS) 0.9 % injection 10 mL  10 mL Intravenous PRN Cammie Sickle, MD   10 mL at 03/20/17 0831  Review of Systems General:  no complaints Skin: no complaints Eyes: no complaints HEENT: no complaints Breasts: no complaints Pulmonary: no complaints Cardiac: no complaints Gastrointestinal: no complaints Genitourinary/Sexual: no complaints Ob/Gyn: no complaints Musculoskeletal: no complaints Hematology: no complaints Neurologic/Psych: no complaints   Objective:   Vitals:   11/13/19 1425  BP: 130/86  Pulse: 84  Temp: 98.2 F (36.8 C)  SpO2: 100%    Body mass index is 24.95 kg/m.  ECOG Performance Status: 0 - Asymptomatic  GENERAL: Patient is a well appearing female in no acute distress HEENT:  Sclera clear. Anicteric NODES:  Negative axillary, supraclavicular, inguinal lymph node survery LUNGS:  Clear to auscultation bilaterally.   HEART:  Regular rate and rhythm.  ABDOMEN:  Soft, nontender.  No hernias, incisions well healed. No masses or ascites EXTREMITIES:  No peripheral edema. Atraumatic. No cyanosis SKIN:  Clear with no obvious rashes or skin changes.  NEURO:  Nonfocal. Well oriented.  Appropriate affect.  Pelvic: exam chaperoned by nurse. Vulva: normal appearing without masses, tenderness or lesions. Vagina: normal appearing; cuff well healed. BME: nontender, no masses. Uterus, Cervix: surgically absent. Rectal: smooth, confirmatory.    Assessment:  Debbie Richardson is a 60 y.o. female with a history of at least stage II  high grade serous ovarian cancer s/p LS BSO and 6 cycles of platin-taxane chemotherapy. No evidence of disease on CT or exam and CA125 normal in March 2019. Interval TLH/Omentectomy 7/16 negative for malignancy. Interval imaging significant for diverticular disease. Persistent pain worked up by GI and thought to be consistent with bacterial overgrowth syndrome and adhesive disease. Prior CT scans and PET all negative for recurrent disease. Port removed. Normal CA-125. GI symptoms improved today. NED on exam today.   Multiple medical complaints and issues improved currently.    Plan:   Problem List Items Addressed This Visit      Endocrine   Neoplasm, ovary, malignant, unspecified laterality ([place]) - Primary     Ca125 continues to be normal, asymptomatic, and no evidence of disease on exam today.  Discussed plan to repeat lab in 1 year and rtc for surveillance visit day to week later with Dr. [name].  Have asked her to return to clinic sooner if concerning or worsening symptoms or pain.  Would consider imaging at that time.  Continue to follow-up with primary care regarding ongoing health maintenance including mammograms and colonoscopy.  Continue to follow-up with GI for history of bacterial overgrowth syndrome with Xifaxan.  A total of 25 minutes were spent with the patient/family today; >50% was spent in education, counseling and coordination of care for stage II at least high grade serous ovarian cancer and abdominal pain.  Beckey Rutter, DNP, AGNP-C [place] [facility] [phone] (clinic)  I personally had a face to face interaction and evaluated the patient jointly with the NP, Ms. Beckey Rutter.  I have reviewed her history and available records and have performed the key portions of the physical exam including inguinal lymph node survey, abdominal exam, pelvic exam with my findings confirming those documented above by the APP.  I have discussed the case with the APP and the  patient.  I agree with the above documentation, assessment and plan which was fully formulated by me.  Counseling was completed by me.   I personally saw the patient and performed a substantive portion of this encounter in conjunction with the listed APP as documented above.  Eufemia Prindle Gaetana Michaelis, MD

## 2019-11-18 ENCOUNTER — Other Ambulatory Visit: Payer: Self-pay

## 2019-11-18 ENCOUNTER — Ambulatory Visit: Payer: 59 | Admitting: Gastroenterology

## 2019-11-18 VITALS — BP 113/71 | HR 71 | Temp 97.5°F | Ht 62.0 in | Wt 136.0 lb

## 2019-11-18 DIAGNOSIS — K580 Irritable bowel syndrome with diarrhea: Secondary | ICD-10-CM

## 2019-11-18 MED ORDER — RIFAXIMIN 550 MG PO TABS: 550.0000 mg | ORAL_TABLET | Freq: Three times a day (TID) | ORAL | 0 refills | Status: AC

## 2019-11-18 NOTE — Progress Notes (Signed)
[name] MD, MRCP(U.K) [address]  [place]  [place], [place] [address]  Main: [phone]  Fax: [phone]   Primary Care Physician: Tower, [name], MD  Primary Gastroenterologist:  Dr. [name]   Abdominal pain visit   HPI: Debbie Richardson is a 59 y.o. female    Last seen at my office back in 2019   Summary of history :  . 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 Undergone BSO in 2016 . CT abdomen 12/2015- no evidence of metastatic disease.  02/27/17- Ct abdomen - diverticulosis of the colon , low stool burden.   Colonoscopy in 04/2014 showed diverticulosis and internal hemorroids. EGD 04/2014 showed - small gastric polyps(fundic gland polyps) otherwise normal.I treated her empirically with doxycycline for 2 weeks , I felt that the adhesions from surgery were likely contributing to stasis and small bowel bacterial overgrowth.Completed 2 rounds of doxycycline.Symptoms recurred in mid October- similar to the past - pain , throbbing in nature, no bloating-she called our office, started doxycycline-felt better- all of a sudden felt it stopped working . Started to watch what she was eating . Towards the end of doxycycline noticed some bloating, no pain but has pressure.CBC and CMP was normal in 12/2016.  Recurrence of bloating symptoms in October 2019 given a course of Augmentin which helped partially but did not resolve the issue.  In December 2019 treated with Xifaxan provided samples.  Another round of treatment in June 2020 with Xifaxan.   Interval history  She states that over the past few months her symptoms of bloating abdominal discomfort have recurred.  Very similar to prior symptoms.  Only difference is that she has had some episodes of diarrhea and abdominal pain has been relieved with a bowel movement on  multiple occasions.  Denies any use of any artificial sugars, weakness, NSAID use.  Of all the antibiotic she has tried in the past she recollects Xifaxan works the best usually gets relief within 2-3 doses. Current Outpatient Medications  Medication Sig Dispense Refill  . acetaminophen (TYLENOL) 500 MG tablet Take 2 tablets (1,000 mg total) by mouth every 6 (six) hours. 30 tablet 0  . meclizine (ANTIVERT) 25 MG tablet Take 1 tablet (25 mg total) by mouth 3 (three) times daily as needed for dizziness. Caution of sedation 30 tablet 0  . omeprazole (PRILOSEC) 20 MG capsule Take 20 mg by mouth daily.     No current facility-administered medications for this visit.   Facility-Administered Medications Ordered in Other Visits  Medication Dose Route Frequency Provider Last Rate Last Admin  . sodium chloride 0.9 % injection 10 mL  10 mL Intravenous PRN Forest Gleason, MD   10 mL at 11/19/14 1628  . sodium chloride flush (NS) 0.9 % injection 10 mL  10 mL Intravenous PRN Cammie Sickle, MD   10 mL at 10/28/15 1135  . sodium chloride flush (NS) 0.9 % injection 10 mL  10 mL Intravenous PRN Cammie Sickle, MD   10 mL at 03/20/17 0831    Allergies as of 11/18/2019 - Review Complete 11/13/2019  Allergen Reaction Noted  . Ciprofloxacin Anaphylaxis 08/19/2013  . Paclitaxel Shortness Of Breath and Other (See Comments) 06/27/2014  . Flagyl [metronidazole] Other (See Comments) 06/22/2015  . Nutmeg oil (myristica oil) Other (See Comments) 02/27/2019    ROS:  General: Negative for anorexia, weight loss, fever, chills, fatigue, weakness. ENT: Negative for hoarseness, difficulty swallowing , nasal congestion. CV: Negative for chest pain, angina, palpitations, dyspnea on exertion, peripheral edema.  Respiratory: Negative for dyspnea at rest, dyspnea on exertion, cough, sputum, wheezing.  GI: See history of present illness. GU:  Negative for dysuria, hematuria, urinary incontinence, urinary  frequency, nocturnal urination.  Endo: Negative for unusual weight change.    Physical Examination:   LMP 03/30/2013   General: Well-nourished, well-developed in no acute distress.  Eyes: No icterus. Conjunctivae pink. Mouth: Oropharyngeal mucosa moist and pink , no lesions erythema or exudate. Lungs: Clear to auscultation bilaterally. Non-labored. Heart: Regular rate and rhythm, no murmurs rubs or gallops.  Abdomen: Bowel sounds are normal, nontender, nondistended, no hepatosplenomegaly or masses, no abdominal bruits or hernia , no rebound or guarding.   Extremities: No lower extremity edema. No clubbing or deformities. Neuro: Alert and oriented x 3.  Grossly intact. Skin: Warm and dry, no jaundice.   Psych: Alert and cooperative, normal mood and affect.   Imaging Studies: No results found.  Assessment and Plan:   Debbie Richardson is a 59 y/o y/o female is here today to see me for abdominal pain.  She has had recurrence of abdominal pain and bloating in the past which I have felt is  likely secondary to adhesions from surgery has contributed to bacterial overgrowth/IBS diarrhea.Responded very well to Xifaxan in the past .   Has been treated with doxycycline, Augmentin.  Each time she responds reasonably well but has recurrence.  This is not uncommon to happen.  Plan 1.  Treat with Xifaxan for 14 days for IBS-D  Dr [name]  MD,MRCP [facility]) Follow up in as needed

## 2019-12-20 ENCOUNTER — Other Ambulatory Visit: Payer: Self-pay

## 2019-12-20 ENCOUNTER — Ambulatory Visit: Payer: 59 | Admitting: Family Medicine

## 2019-12-20 ENCOUNTER — Encounter: Payer: Self-pay | Admitting: Family Medicine

## 2019-12-20 DIAGNOSIS — S060X9A Concussion with loss of consciousness of unspecified duration, initial encounter: Secondary | ICD-10-CM | POA: Diagnosis not present

## 2019-12-20 DIAGNOSIS — S060X0S Concussion without loss of consciousness, sequela: Secondary | ICD-10-CM

## 2019-12-20 DIAGNOSIS — S060XAA Concussion with loss of consciousness status unknown, initial encounter: Secondary | ICD-10-CM | POA: Insufficient documentation

## 2019-12-20 NOTE — Patient Instructions (Signed)
Take care of yourself  Use pain medicines as needed  Ice helps headache also  Rest your brain whenever you can (avoid screens/reading/stimulation)  Rest over the weekend  Drink lots of fluids If a symptom worsens-let us know

## 2019-12-20 NOTE — Progress Notes (Signed)
Subjective:    Patient ID: Debbie Richardson, female    DOB: 04/07/1960, 59 y.o.   MRN: [id number]  This visit occurred during the SARS-CoV-2 public health emergency.  Safety protocols were in place, including screening questions prior to the visit, additional usage of staff PPE, and extensive cleaning of exam room while observing appropriate contact time as indicated for disinfecting solutions.    HPI  Pt presents for f/u of concussion   Wt Readings from Last 3 Encounters:  12/20/19 138 lb 6.4 oz (62.8 kg)  11/18/19 136 lb (61.7 kg)  11/13/19 136 lb 6.4 oz (61.9 kg)   25.31 kg/m   Was seen in ER on 9/4 She had head injury the night before (hit her head on the back of a booth) - very hard  No LOC, not on blood thinners  Had a neg CT scan  Nl exam in ER  Report:  Signed By: Marily Memos, MD on 12/07/2019 3:39 PM Narrative Performed by RADIOLOGY EXAM: ED CT HEAD NO CONTRAST   CLINICAL INDICATION/HISTORY: 59 years Female. Head trauma, headache  ADDITIONAL HISTORY: Struck back of head on restaurant booth last night   TECHNIQUE: CT of the head from the vertex to the skull base performed. No IV contrast administered.   All CT scans at this facility are performed using dose optimization technique as appropriate to a performed exam, to include automated exposure control, adjustment of the mA and/or kV according to patient size (including appropriate matching for site specific examination) or use of iterative reconstruction technique.   COMPARISON: None.   FINDINGS:   No periarticular white matter hypodensities, nonspecific but likely sequela of chronic microvascular ischemic disease.   No evidence of acute intracranial hemorrhage. The gray-white differentiation appears preserved. The ventricles, sulci, and cisterns are concordant with cerebral volume. There is no mass effect. There is no midline shift. There is no evidence of acute fracture. The visualized paranasal  sinuses and mastoid air cells are essentially clear.  BP Readings from Last 3 Encounters:  12/20/19 110/70  11/18/19 113/71  11/13/19 130/86   Pulse Readings from Last 3 Encounters:  12/20/19 98  11/18/19 71  11/13/19 84   Had quite a bit of swelling and headache initially  Did not feel right  She took ibuprofen  Now taking less of that and some tylenol   Some remaining headache-dull during the course of the day  occ wakes up with it Some seasonal allergies also  Also noted some trouble concentrating  Not dizzy -thankfully  Is working and using screen   Patient Active Problem List   Diagnosis Date Noted  . Concussion 12/20/2019  . Lipid screening 06/21/2019  . History of anaphylaxis 06/21/2019  . Screening mammogram, encounter for 01/07/2019  . History of ovarian cancer 10/03/2018  . Bloating 08/02/2017  . Menopause 09/14/2016  . Neoplasm, ovary, malignant, unspecified laterality ([place]) 12/09/2015  . Left flank pain 06/22/2015  . Abdominal tenderness of left lower quadrant 06/22/2015  . Lower abdominal pain 11/26/2014  . Status post laparoscopic hysterectomy 10/29/2014  . Allergic conjunctivitis and rhinitis 07/04/2012  . GERD (gastroesophageal reflux disease) 05/01/2007  . PSORIASIS NEC 12/07/2006  . Vertigo 08/25/2006   Past Medical History:  Diagnosis Date  . Anxiety   . Arthritis   . Benign fundic gland polyps of stomach   . Dizziness    vertigo  . Gallstones   . GERD (gastroesophageal reflux disease)   . Ovarian cancer ([place]) 2016  .  Personal history of chemotherapy    Past Surgical History:  Procedure Laterality Date  . CESAREAN SECTION    . CHOLECYSTECTOMY    . COLONOSCOPY    . LAPAROSCOPIC HYSTERECTOMY N/A 10/29/2014   Procedure: HYSTERECTOMY TOTAL LAPAROSCOPIC, OMENTUMECTOMY;  Surgeon: Gillis Ends, MD;  Location: [facility];  Service: Gynecology;  Laterality: N/A;  . LAPAROSCOPIC OOPHERECTOMY    . OOPHORECTOMY    . PORTA CATH REMOVAL N/A  04/26/2018   Procedure: PORTA CATH REMOVAL;  Surgeon: Algernon Huxley, MD;  Location: [facility];  Service: Cardiovascular;  Laterality: N/A;  . UPPER GI ENDOSCOPY     Social History   Tobacco Use  . Smoking status: Former Smoker    Quit date: 04/05/1991    Years since quitting: 28.7  . Smokeless tobacco: Never Used  Vaping Use  . Vaping Use: Never used  Substance Use Topics  . Alcohol use: Yes    Alcohol/week: 0.0 standard drinks    Comment: 1 drink of wine daily  . Drug use: No   Family History  Problem Relation Age of Onset  . Hypertension Mother        26yo   . Hyperlipidemia Mother   . GER disease Mother   . Heart disease Father        79yo  . Breast cancer Sister 35  . Heart disease Paternal Grandmother   . Breast cancer Paternal Grandmother 23       deceased 50  . Kidney disease Neg Hx   . Liver disease Neg Hx   . Colon cancer Neg Hx   . Colon polyps Neg Hx   . Esophageal cancer Neg Hx   . Pancreatic cancer Neg Hx    Allergies  Allergen Reactions  . Ciprofloxacin Anaphylaxis  . Paclitaxel Shortness Of Breath and Other (See Comments)    Other reaction(s): Tight chest (finding) Chest and facial flushing Flushing, chest tightness, SOB w/ Taxol on 05/26/14 at [place].  . Flagyl [Metronidazole] Other (See Comments)    headache  . Nutmeg Oil (Myristica Oil) Other (See Comments)    Tightness in chest and throat and difficulty swallowing and breathing.   Current Outpatient Medications on File Prior to Visit  Medication Sig Dispense Refill  . acetaminophen (TYLENOL) 500 MG tablet Take 2 tablets (1,000 mg total) by mouth every 6 (six) hours. 30 tablet 0  . meclizine (ANTIVERT) 25 MG tablet Take 1 tablet (25 mg total) by mouth 3 (three) times daily as needed for dizziness. Caution of sedation (Patient not taking: Reported on 11/18/2019) 30 tablet 0  . omeprazole (PRILOSEC) 20 MG capsule Take 20 mg by mouth daily. (Patient not taking: Reported on 11/18/2019)      Current Facility-Administered Medications on File Prior to Visit  Medication Dose Route Frequency Provider Last Rate Last Admin  . sodium chloride 0.9 % injection 10 mL  10 mL Intravenous PRN Forest Gleason, MD   10 mL at 11/19/14 1628  . sodium chloride flush (NS) 0.9 % injection 10 mL  10 mL Intravenous PRN Cammie Sickle, MD   10 mL at 10/28/15 1135  . sodium chloride flush (NS) 0.9 % injection 10 mL  10 mL Intravenous PRN Cammie Sickle, MD   10 mL at 03/20/17 0831    Review of Systems  Constitutional: Negative for activity change, appetite change, fatigue, fever and unexpected weight change.  HENT: Negative for congestion, ear pain, rhinorrhea, sinus pressure and sore throat.  Eyes: Negative for pain, redness and visual disturbance.  Respiratory: Negative for cough, shortness of breath and wheezing.   Cardiovascular: Negative for chest pain and palpitations.  Gastrointestinal: Negative for abdominal pain, blood in stool, constipation and diarrhea.  Endocrine: Negative for polydipsia and polyuria.  Genitourinary: Negative for dysuria, frequency and urgency.  Musculoskeletal: Negative for arthralgias, back pain and myalgias.  Skin: Negative for pallor and rash.  Allergic/Immunologic: Negative for environmental allergies.  Neurological: Positive for headaches. Negative for dizziness and syncope.  Hematological: Negative for adenopathy. Does not bruise/bleed easily.  Psychiatric/Behavioral: Positive for decreased concentration. Negative for confusion, dysphoric mood, hallucinations and sleep disturbance. The patient is not nervous/anxious.        Objective:   Physical Exam Constitutional:      General: She is not in acute distress.    Appearance: Normal appearance. She is well-developed and normal weight. She is not ill-appearing or diaphoretic.  HENT:     Head: Normocephalic and atraumatic.     Comments: No head tenderness    Right Ear: External ear normal.      Left Ear: External ear normal.     Nose: Nose normal.     Mouth/Throat:     Pharynx: No oropharyngeal exudate.  Eyes:     General: No scleral icterus.       Right eye: No discharge.        Left eye: No discharge.     Conjunctiva/sclera: Conjunctivae normal.     Pupils: Pupils are equal, round, and reactive to light.     Comments: No nystagmus  Neck:     Thyroid: No thyromegaly.     Vascular: No carotid bruit or JVD.     Trachea: No tracheal deviation.  Cardiovascular:     Rate and Rhythm: Regular rhythm. Tachycardia present.     Heart sounds: Normal heart sounds. No murmur heard.   Pulmonary:     Effort: Pulmonary effort is normal. No respiratory distress.     Breath sounds: Normal breath sounds. No wheezing or rales.  Abdominal:     General: Bowel sounds are normal. There is no distension.     Palpations: Abdomen is soft. There is no mass.     Tenderness: There is no abdominal tenderness.  Musculoskeletal:        General: No tenderness.     Cervical back: Full passive range of motion without pain, normal range of motion and neck supple. No rigidity or tenderness.  Lymphadenopathy:     Cervical: No cervical adenopathy.  Skin:    General: Skin is warm and dry.     Coloration: Skin is not pale.     Findings: No rash.  Neurological:     Mental Status: She is alert and oriented to person, place, and time.     Cranial Nerves: No cranial nerve deficit, dysarthria or facial asymmetry.     Sensory: Sensation is intact. No sensory deficit.     Motor: Motor function is intact. No weakness, tremor, atrophy, abnormal muscle tone or pronator drift.     Coordination: Coordination is intact. Romberg sign negative. Coordination normal. Finger-Nose-Finger Test normal.     Gait: Gait is intact. Gait normal.     Deep Tendon Reflexes: Reflexes are normal and symmetric.     Comments: No focal cerebellar signs   Psychiatric:        Behavior: Behavior normal.        Thought Content: Thought  content normal.  Assessment & Plan:   Problem List Items Addressed This Visit      Other   Concussion    On 9/4 -hitting back of head  Reviewed hospital records, lab results and studies in detail   Re assuring CT scan and exam  Some residual headache-is gradually improving Disc imp of brain rest (also avoiding another injury) Will take breaks from work/screen whenever possible (able to do this over the weekend)  Rest/nutrion/fluids  Inst to update and seek care if worse headache or any dizziness/ nausea/confusion/ worse concentration or anything new Handout given

## 2019-12-21 NOTE — Assessment & Plan Note (Signed)
On 9/4 -hitting back of head  Reviewed hospital records, lab results and studies in detail   Re assuring CT scan and exam  Some residual headache-is gradually improving Disc imp of brain rest (also avoiding another injury) Will take breaks from work/screen whenever possible (able to do this over the weekend)  Rest/nutrion/fluids  Inst to update and seek care if worse headache or any dizziness/ nausea/confusion/ worse concentration or anything new Handout given

## 2020-03-24 ENCOUNTER — Other Ambulatory Visit: Payer: Self-pay

## 2020-03-24 ENCOUNTER — Encounter: Payer: Self-pay | Admitting: Family Medicine

## 2020-03-24 ENCOUNTER — Ambulatory Visit: Payer: 59 | Admitting: Family Medicine

## 2020-03-24 VITALS — BP 106/68 | HR 100 | Temp 96.9°F | Ht 62.0 in | Wt 138.4 lb

## 2020-03-24 DIAGNOSIS — K219 Gastro-esophageal reflux disease without esophagitis: Secondary | ICD-10-CM

## 2020-03-24 MED ORDER — PANTOPRAZOLE SODIUM 40 MG PO TBEC
40.0000 mg | DELAYED_RELEASE_TABLET | Freq: Every day | ORAL | 3 refills | Status: DC
Start: 2020-03-24 — End: 2021-04-20

## 2020-03-24 MED ORDER — CIMETIDINE 300 MG PO TABS: 300.0000 mg | ORAL_TABLET | Freq: Two times a day (BID) | ORAL | 1 refills | Status: DC

## 2020-03-24 NOTE — Progress Notes (Signed)
Subjective:    Patient ID: Debbie Richardson, female    DOB: 1961-03-25, 59 y.o.   MRN: [id number]  This visit occurred during the SARS-CoV-2 public health emergency.  Safety protocols were in place, including screening questions prior to the visit, additional usage of staff PPE, and extensive cleaning of exam room while observing appropriate contact time as indicated for disinfecting solutions.    HPI Pt presents with c/o GERD symptoms   Wt Readings from Last 3 Encounters:  03/24/20 138 lb 6 oz (62.8 kg)  12/20/19 138 lb 6.4 oz (62.8 kg)  11/18/19 136 lb (61.7 kg)   25.31 kg/m  Having heartburn "out of the blue"  Very busy/ holidays and no rest / some work stress  Worse at night  Feels acid in am  Feels like acid is sitting in esophagus  No abd pain   Did take advil a lot before early November for headaches -then stopped it   Some burping/belching  No stool change , no dark stool  Very slight occ nausea at night  No certain foods make her worse -eats fairly healthy   Eating bland  Some lemon water  Props up at night   Has had gallbladder out    Tried prilosec otc -no help  otc H2 blocker- helped a bit  No antacids    Nl EGD in 2016  Had bx with fundic gland polyp   Patient Active Problem List   Diagnosis Date Noted  . Concussion 12/20/2019  . Lipid screening 06/21/2019  . History of anaphylaxis 06/21/2019  . Screening mammogram, encounter for 01/07/2019  . History of ovarian cancer 10/03/2018  . Bloating 08/02/2017  . Menopause 09/14/2016  . Neoplasm, ovary, malignant, unspecified laterality ([place]) 12/09/2015  . Left flank pain 06/22/2015  . Abdominal tenderness of left lower quadrant 06/22/2015  . Lower abdominal pain 11/26/2014  . Status post laparoscopic hysterectomy 10/29/2014  . Allergic conjunctivitis and rhinitis 07/04/2012  . GERD (gastroesophageal reflux disease) 05/01/2007  . PSORIASIS NEC 12/07/2006  . Vertigo 08/25/2006   Past Medical  History:  Diagnosis Date  . Anxiety   . Arthritis   . Benign fundic gland polyps of stomach   . Dizziness    vertigo  . Gallstones   . GERD (gastroesophageal reflux disease)   . Ovarian cancer ([place]) 2016  . Personal history of chemotherapy    Past Surgical History:  Procedure Laterality Date  . CESAREAN SECTION    . CHOLECYSTECTOMY    . COLONOSCOPY    . LAPAROSCOPIC HYSTERECTOMY N/A 10/29/2014   Procedure: HYSTERECTOMY TOTAL LAPAROSCOPIC, OMENTUMECTOMY;  Surgeon: Gillis Ends, MD;  Location: [facility];  Service: Gynecology;  Laterality: N/A;  . LAPAROSCOPIC OOPHERECTOMY    . OOPHORECTOMY    . PORTA CATH REMOVAL N/A 04/26/2018   Procedure: PORTA CATH REMOVAL;  Surgeon: Algernon Huxley, MD;  Location: [facility];  Service: Cardiovascular;  Laterality: N/A;  . UPPER GI ENDOSCOPY     Social History   Tobacco Use  . Smoking status: Former Smoker    Quit date: 04/05/1991    Years since quitting: 28.9  . Smokeless tobacco: Never Used  Vaping Use  . Vaping Use: Never used  Substance Use Topics  . Alcohol use: Yes    Alcohol/week: 0.0 standard drinks    Comment: 1 drink of wine daily  . Drug use: No   Family History  Problem Relation Age of Onset  . Hypertension Mother  64yo   . Hyperlipidemia Mother   . GER disease Mother   . Heart disease Father        68yo  . Breast cancer Sister 44  . Heart disease Paternal Grandmother   . Breast cancer Paternal Grandmother 38       deceased 8  . Kidney disease Neg Hx   . Liver disease Neg Hx   . Colon cancer Neg Hx   . Colon polyps Neg Hx   . Esophageal cancer Neg Hx   . Pancreatic cancer Neg Hx    Allergies  Allergen Reactions  . Ciprofloxacin Anaphylaxis  . Paclitaxel Shortness Of Breath and Other (See Comments)    Other reaction(s): Tight chest (finding) Chest and facial flushing Flushing, chest tightness, SOB w/ Taxol on 05/26/14 at [place].  . Flagyl [Metronidazole] Other (See Comments)     headache  . Nutmeg Oil (Myristica Oil) Other (See Comments)    Tightness in chest and throat and difficulty swallowing and breathing.   Current Outpatient Medications on File Prior to Visit  Medication Sig Dispense Refill  . acetaminophen (TYLENOL) 500 MG tablet Take 2 tablets (1,000 mg total) by mouth every 6 (six) hours. 30 tablet 0  . meclizine (ANTIVERT) 25 MG tablet Take 1 tablet (25 mg total) by mouth 3 (three) times daily as needed for dizziness. Caution of sedation 30 tablet 0   Current Facility-Administered Medications on File Prior to Visit  Medication Dose Route Frequency Provider Last Rate Last Admin  . sodium chloride 0.9 % injection 10 mL  10 mL Intravenous PRN Forest Gleason, MD   10 mL at 11/19/14 1628  . sodium chloride flush (NS) 0.9 % injection 10 mL  10 mL Intravenous PRN Cammie Sickle, MD   10 mL at 10/28/15 1135  . sodium chloride flush (NS) 0.9 % injection 10 mL  10 mL Intravenous PRN Cammie Sickle, MD   10 mL at 03/20/17 0831    Review of Systems  Constitutional: Negative for activity change, appetite change, fatigue, fever and unexpected weight change.  HENT: Negative for congestion, ear pain, rhinorrhea, sinus pressure, sore throat, trouble swallowing and voice change.   Eyes: Negative for pain, redness and visual disturbance.  Respiratory: Negative for cough, shortness of breath and wheezing.   Cardiovascular: Negative for chest pain and palpitations.  Gastrointestinal: Positive for nausea. Negative for abdominal distention, abdominal pain, blood in stool, constipation, diarrhea, rectal pain and vomiting.       Heartburn  Endocrine: Negative for polydipsia and polyuria.  Genitourinary: Negative for dysuria, frequency and urgency.  Musculoskeletal: Negative for arthralgias, back pain and myalgias.  Skin: Negative for pallor and rash.  Allergic/Immunologic: Negative for environmental allergies.  Neurological: Negative for dizziness, syncope and  headaches.  Hematological: Negative for adenopathy. Does not bruise/bleed easily.  Psychiatric/Behavioral: Negative for decreased concentration and dysphoric mood. The patient is not nervous/anxious.        Objective:   Physical Exam Constitutional:      General: She is not in acute distress.    Appearance: Normal appearance. She is well-developed, normal weight and well-nourished. She is not ill-appearing.  HENT:     Head: Normocephalic and atraumatic.     Mouth/Throat:     Mouth: Oropharynx is clear and moist.  Eyes:     General: No scleral icterus.    Extraocular Movements: EOM normal.     Conjunctiva/sclera: Conjunctivae normal.     Pupils: Pupils are equal, round,  and reactive to light.  Cardiovascular:     Rate and Rhythm: Normal rate and regular rhythm.     Heart sounds: Normal heart sounds.  Pulmonary:     Effort: Pulmonary effort is normal. No respiratory distress.     Breath sounds: Normal breath sounds. No wheezing or rales.  Abdominal:     General: Abdomen is flat. Bowel sounds are normal. There is no distension.     Palpations: Abdomen is soft. There is no hepatomegaly, splenomegaly or mass.     Tenderness: There is abdominal tenderness in the epigastric area. There is no guarding or rebound. Negative signs include Murphy's sign and McBurney's sign.  Musculoskeletal:     Cervical back: Normal range of motion and neck supple.  Lymphadenopathy:     Cervical: No cervical adenopathy.  Skin:    General: Skin is warm and dry.     Coloration: Skin is not jaundiced or pale.     Findings: No erythema.  Neurological:     Mental Status: She is alert.  Psychiatric:        Mood and Affect: Mood and affect and mood normal.           Assessment & Plan:   Problem List Items Addressed This Visit      Digestive   GERD (gastroesophageal reflux disease) - Primary    Worse lately with more stressors (and also a mo of nsaids in the fall)  Disc diet -handout given   Elevate head of bed  Px tagamet bid and protonix daily  Meds ordered this encounter  Medications  . pantoprazole (PROTONIX) 40 MG tablet    Sig: Take 1 tablet (40 mg total) by mouth daily.    Dispense:  90 tablet    Refill:  3  . cimetidine (TAGAMET) 300 MG tablet    Sig: Take 1 tablet (300 mg total) by mouth 2 (two) times daily.    Dispense:  180 tablet    Refill:  1   Update if worse or no imp in 10 d F/u in 2-3 mo      Relevant Medications   pantoprazole (PROTONIX) 40 MG tablet   cimetidine (TAGAMET) 300 MG tablet

## 2020-03-24 NOTE — Patient Instructions (Addendum)
Start generic protonix 40 mg once daily  Also generic tagamet  twice daily   Stick with a bland diet for next several days - avoid spicy stuff and acid   If worse let us know  If not starting to improve in the next 10 days let us know   Follow up here in 2-3 months

## 2020-03-24 NOTE — Assessment & Plan Note (Signed)
Worse lately with more stressors (and also a mo of nsaids in the fall)  Disc diet -handout given  Elevate head of bed  Px tagamet bid and protonix daily  Meds ordered this encounter  Medications  . pantoprazole (PROTONIX) 40 MG tablet    Sig: Take 1 tablet (40 mg total) by mouth daily.    Dispense:  90 tablet    Refill:  3  . cimetidine (TAGAMET) 300 MG tablet    Sig: Take 1 tablet (300 mg total) by mouth 2 (two) times daily.    Dispense:  180 tablet    Refill:  1   Update if worse or no imp in 10 d F/u in 2-3 mo

## 2020-04-06 ENCOUNTER — Encounter: Payer: Self-pay | Admitting: Family Medicine

## 2020-04-06 ENCOUNTER — Telehealth (INDEPENDENT_AMBULATORY_CARE_PROVIDER_SITE_OTHER): Payer: 59 | Admitting: Family Medicine

## 2020-04-06 VITALS — Temp 97.7°F | Ht 62.0 in | Wt 133.0 lb

## 2020-04-06 DIAGNOSIS — R42 Dizziness and giddiness: Secondary | ICD-10-CM | POA: Diagnosis not present

## 2020-04-06 DIAGNOSIS — H6981 Other specified disorders of Eustachian tube, right ear: Secondary | ICD-10-CM | POA: Diagnosis not present

## 2020-04-06 MED ORDER — FLUTICASONE PROPIONATE 50 MCG/ACT NA SUSP
2.0000 | Freq: Every day | NASAL | 6 refills | Status: DC
Start: 2020-04-06 — End: 2020-10-06

## 2020-04-06 NOTE — Progress Notes (Signed)
Virtual Visit via Video Note  I connected with Debbie Richardson on 04/06/20 at  8:00 AM EST by a video enabled telemedicine application and verified that I am speaking with the correct person using two identifiers.  Location: Patient: home Provider: office   I discussed the limitations of evaluation and management by telemedicine and the availability of in person appointments. The patient expressed understanding and agreed to proceed.  Parties involved in encounter  Patient: Debbie Richardson  Provider:  Loura Pardon MD    History of Present Illness: Pt presents for c/o vertigo   Started a week ago Sunday- a little dizzy (weather change)  Tuesday got worse -hit her fast and then worse on Wednesday   Feels imbalanced as well as dizzy  Walking sideways/holding on  Ears are popping and cracking a lot-feels pressure  A lot of pressure in R maxillary sinus area and teeth     Doing saline rinse every day  otc decongestants every 4-6 h -this seems to help ears some  Avoiding sodium  Cut out caffeine and alcohol  Drinks lots of fluids  No green mucous/not blowing nose a lot  No fever No coughing   Almost took meclizine but was working  She did the epeley maneuver on wednes   got better and then worse (sat) This am a little worse   No headache   She stopped tagamet for heartburn  Doing better   Patient Active Problem List   Diagnosis Date Noted  . Concussion 12/20/2019  . Lipid screening 06/21/2019  . History of anaphylaxis 06/21/2019  . Screening mammogram, encounter for 01/07/2019  . History of ovarian cancer 10/03/2018  . Bloating 08/02/2017  . Menopause 09/14/2016  . Neoplasm, ovary, malignant, unspecified laterality ([place]) 12/09/2015  . Left flank pain 06/22/2015  . Abdominal tenderness of left lower quadrant 06/22/2015  . Lower abdominal pain 11/26/2014  . Status post laparoscopic hysterectomy 10/29/2014  . Allergic conjunctivitis and rhinitis 07/04/2012  .  ETD (eustachian tube dysfunction) 07/02/2007  . GERD (gastroesophageal reflux disease) 05/01/2007  . PSORIASIS NEC 12/07/2006  . Vertigo 08/25/2006   Past Medical History:  Diagnosis Date  . Anxiety   . Arthritis   . Benign fundic gland polyps of stomach   . Dizziness    vertigo  . Gallstones   . GERD (gastroesophageal reflux disease)   . Ovarian cancer ([place]) 2016  . Personal history of chemotherapy    Past Surgical History:  Procedure Laterality Date  . CESAREAN SECTION    . CHOLECYSTECTOMY    . COLONOSCOPY    . LAPAROSCOPIC HYSTERECTOMY N/A 10/29/2014   Procedure: HYSTERECTOMY TOTAL LAPAROSCOPIC, OMENTUMECTOMY;  Surgeon: Gillis Ends, MD;  Location: [facility];  Service: Gynecology;  Laterality: N/A;  . LAPAROSCOPIC OOPHERECTOMY    . OOPHORECTOMY    . PORTA CATH REMOVAL N/A 04/26/2018   Procedure: PORTA CATH REMOVAL;  Surgeon: Algernon Huxley, MD;  Location: [facility];  Service: Cardiovascular;  Laterality: N/A;  . UPPER GI ENDOSCOPY     Social History   Tobacco Use  . Smoking status: Former Smoker    Quit date: 04/05/1991    Years since quitting: 29.0  . Smokeless tobacco: Never Used  Vaping Use  . Vaping Use: Never used  Substance Use Topics  . Alcohol use: Yes    Alcohol/week: 0.0 standard drinks    Comment: 1 drink of wine daily  . Drug use: No   Family History  Problem Relation  Age of Onset  . Hypertension Mother        79yo   . Hyperlipidemia Mother   . GER disease Mother   . Heart disease Father        47yo  . Breast cancer Sister 4  . Heart disease Paternal Grandmother   . Breast cancer Paternal Grandmother 28       deceased 13  . Kidney disease Neg Hx   . Liver disease Neg Hx   . Colon cancer Neg Hx   . Colon polyps Neg Hx   . Esophageal cancer Neg Hx   . Pancreatic cancer Neg Hx    Allergies  Allergen Reactions  . Ciprofloxacin Anaphylaxis  . Paclitaxel Shortness Of Breath and Other (See Comments)    Other reaction(s): Tight  chest (finding) Chest and facial flushing Flushing, chest tightness, SOB w/ Taxol on 05/26/14 at [facility]  . Flagyl [Metronidazole] Other (See Comments)    headache  . Nutmeg Oil (Myristica Oil) Other (See Comments)    Tightness in chest and throat and difficulty swallowing and breathing.   Current Outpatient Medications on File Prior to Visit  Medication Sig Dispense Refill  . acetaminophen (TYLENOL) 500 MG tablet Take 2 tablets (1,000 mg total) by mouth every 6 (six) hours. 30 tablet 0  . meclizine (ANTIVERT) 25 MG tablet Take 1 tablet (25 mg total) by mouth 3 (three) times daily as needed for dizziness. Caution of sedation 30 tablet 0  . pantoprazole (PROTONIX) 40 MG tablet Take 1 tablet (40 mg total) by mouth daily. 90 tablet 3   Current Facility-Administered Medications on File Prior to Visit  Medication Dose Route Frequency Provider Last Rate Last Admin  . sodium chloride 0.9 % injection 10 mL  10 mL Intravenous PRN Johney Maine, MD   10 mL at 11/19/14 1628  . sodium chloride flush (NS) 0.9 % injection 10 mL  10 mL Intravenous PRN Earna Coder, MD   10 mL at 10/28/15 1135  . sodium chloride flush (NS) 0.9 % injection 10 mL  10 mL Intravenous PRN Earna Coder, MD   10 mL at 03/20/17 0831   Review of Systems  Constitutional: Negative for chills, fever and malaise/fatigue.  HENT: Negative for congestion, ear discharge, ear pain, sinus pain and sore throat.        Ear and sinus pressure (not pain) on the R  Eyes: Negative for blurred vision, discharge and redness.  Respiratory: Negative for cough, shortness of breath and stridor.   Cardiovascular: Negative for chest pain, palpitations and leg swelling.  Gastrointestinal: Negative for abdominal pain, diarrhea, nausea and vomiting.  Musculoskeletal: Negative for myalgias.  Skin: Negative for rash.  Neurological: Negative for dizziness, tremors, sensory change, speech change, focal weakness, weakness and  headaches.    Observations/Objective: Patient appears well, in no distress Weight is baseline  No facial swelling or asymmetry Normal voice-not hoarse and no slurred speech No obvious tremor or mobility impairment Moving neck and UEs normally Able to hear the call well  No cough or shortness of breath during interview  Talkative and mentally sharp with no cognitive changes No skin changes on face or neck , no rash or pallor Affect is normal    Assessment and Plan: Problem List Items Addressed This Visit      Nervous and Auditory   ETD (eustachian tube dysfunction)    In R ear with sinus pressure also and dizziness Px flonase ns to use bid  2-3 d then daily Update if not starting to improve in a week or if worsening  Especially if ear pain        Other   Vertigo - Primary    Pt has struggled over the past week with this and some imbalance  (a lot of weather change) Also full feeling in R ear and maxillary sinus (no mucous)  Decongestants and the epeley maneuver have helped some Disc poss of ETD  Px flonase ns for use bid for 2-3 d and then daily  Has meclizine on hand if needed inst to call if any new symptoms (disc s/s of sinus inf)  Update if not starting to improve in a week or if worsening             Follow Up Instructions: Keep drinking fluids Keep meclizine on hand  Use flonase nasal spray as directed twice daily for 2-3 days and then once daily   See if this helps ear/sinus pressure and dizziness   Update if not starting to improve in a week or if worsening    I discussed the assessment and treatment plan with the patient. The patient was provided an opportunity to ask questions and all were answered. The patient agreed with the plan and demonstrated an understanding of the instructions.   The patient was advised to call back or seek an in-person evaluation if the symptoms worsen or if the condition fails to improve as anticipated.     Loura Pardon,  MD

## 2020-04-06 NOTE — Assessment & Plan Note (Signed)
Pt has struggled over the past week with this and some imbalance  (a lot of weather change) Also full feeling in R ear and maxillary sinus (no mucous)  Decongestants and the epeley maneuver have helped some Disc poss of ETD  Px flonase ns for use bid for 2-3 d and then daily  Has meclizine on hand if needed inst to call if any new symptoms (disc s/s of sinus inf)  Update if not starting to improve in a week or if worsening

## 2020-04-06 NOTE — Patient Instructions (Signed)
Keep drinking fluids Keep meclizine on hand  Use flonase nasal spray as directed twice daily for 2-3 days and then once daily   See if this helps ear/sinus pressure and dizziness   Update if not starting to improve in a week or if worsening

## 2020-04-06 NOTE — Assessment & Plan Note (Signed)
In R ear with sinus pressure also and dizziness Px flonase ns to use bid 2-3 d then daily Update if not starting to improve in a week or if worsening  Especially if ear pain

## 2020-05-11 ENCOUNTER — Encounter: Payer: Self-pay | Admitting: Family Medicine

## 2020-05-11 ENCOUNTER — Other Ambulatory Visit: Payer: Self-pay

## 2020-05-11 ENCOUNTER — Ambulatory Visit: Payer: 59 | Admitting: Family Medicine

## 2020-05-11 VITALS — BP 102/74 | HR 86 | Temp 96.6°F | Resp 14 | Ht 62.0 in | Wt 139.1 lb

## 2020-05-11 DIAGNOSIS — K219 Gastro-esophageal reflux disease without esophagitis: Secondary | ICD-10-CM | POA: Diagnosis not present

## 2020-05-11 DIAGNOSIS — H6980 Other specified disorders of Eustachian tube, unspecified ear: Secondary | ICD-10-CM | POA: Diagnosis not present

## 2020-05-11 DIAGNOSIS — R42 Dizziness and giddiness: Secondary | ICD-10-CM

## 2020-05-11 NOTE — Assessment & Plan Note (Signed)
Improved with flonase

## 2020-05-11 NOTE — Progress Notes (Signed)
Subjective:    Patient ID: Debbie Richardson, female    DOB: 1960-10-04, 60 y.o.   MRN: [id number]  This visit occurred during the SARS-CoV-2 public health emergency.  Safety protocols were in place, including screening questions prior to the visit, additional usage of staff PPE, and extensive cleaning of exam room while observing appropriate contact time as indicated for disinfecting solutions.    HPI Pt presents for f/u of GERD and vertigo  Wt Readings from Last 3 Encounters:  05/11/20 139 lb 2 oz (63.1 kg)  04/06/20 133 lb (60.3 kg)  03/24/20 138 lb 6 oz (62.8 kg)   25.45 kg/m  GERD Seen in dec Taking protonix 40 mg daily and tagamet 300 mg bid  She then stopped the tagamet all together  Takes protonix 40 in am   sympt are a lot better     Vertigo In past tx with meclizine and flonase  flonase helps but feeling of unbalanced is still there  She went to ENT last week  There was a ? As to if actually vertigo  Did epley maneuver  She felt like that makes her worse- has another one scheduled for Wednesday  If no improvement will send back here   Sinus wise- the flonase helps   Of note she had a concussion 9/21 - with full recovery Had re assuring CT head at the time At [facility]. ED CT HEAD NO CONTRAST  Narrative  EXAM: ED CT HEAD NO CONTRAST  CLINICAL INDICATION/HISTORY: [age] years Female. Head trauma, headache  ADDITIONAL HISTORY: Struck back of head on restaurant booth last night  TECHNIQUE: CT of the head from the vertex to the skull base performed. No IV contrast administered.  All CT scans at this facility are performed using dose optimization technique as appropriate to a performed exam, to include automated exposure control, adjustment of the mA and/or kV according to patient size (including appropriate matching for site specific examination) or use of iterative reconstruction technique.  COMPARISON: None.  FINDINGS:   No periarticular white matter  hypodensities, nonspecific but likely sequela of chronic microvascular ischemic disease.  No evidence of acute intracranial hemorrhage. The gray-white differentiation appears preserved. The ventricles, sulci, and cisterns are concordant with cerebral volume. There is no mass effect. There is no midline shift. There is no evidence of acute fracture. The visualized paranasal sinuses and mastoid air cells are essentially clear.  Impression  No evidence of acute traumatic intracranial process.      No colored mucous  Stays a little congested No sinus pain   No headache or weakness or numbness   BP is low compared to the past BP Readings from Last 3 Encounters:  05/11/20 102/74  03/24/20 106/68  12/20/19 110/70   Pulse Readings from Last 3 Encounters:  05/11/20 86  03/24/20 100  12/20/19 98   Has not noticed orthostatic symptoms  Head movement brings on dizziness than body movent    Lab Results  Component Value Date   CREATININE 0.69 06/21/2019   BUN 19 06/21/2019   NA 137 06/21/2019   K 3.9 06/21/2019   CL 103 06/21/2019   CO2 26 06/21/2019   Lab Results  Component Value Date   WBC 3.6 (L) 06/21/2019   HGB 13.3 06/21/2019   HCT 38.0 06/21/2019   MCV 92.1 06/21/2019   PLT 196.0 06/21/2019   Patient Active Problem List   Diagnosis Date Noted  . Concussion 12/20/2019  . Lipid screening 06/21/2019  .  History of anaphylaxis 06/21/2019  . Screening mammogram, encounter for 01/07/2019  . History of ovarian cancer 10/03/2018  . Bloating 08/02/2017  . Menopause 09/14/2016  . Neoplasm, ovary, malignant, unspecified laterality ([place]) 12/09/2015  . Left flank pain 06/22/2015  . Abdominal tenderness of left lower quadrant 06/22/2015  . Lower abdominal pain 11/26/2014  . Status post laparoscopic hysterectomy 10/29/2014  . Allergic conjunctivitis and rhinitis 07/04/2012  . ETD (eustachian tube dysfunction) 07/02/2007  . GERD (gastroesophageal reflux disease) 05/01/2007  .  PSORIASIS NEC 12/07/2006  . Vertigo 08/25/2006   Past Medical History:  Diagnosis Date  . Anxiety   . Arthritis   . Benign fundic gland polyps of stomach   . Dizziness    vertigo  . Gallstones   . GERD (gastroesophageal reflux disease)   . Ovarian cancer ([place]) 2016  . Personal history of chemotherapy    Past Surgical History:  Procedure Laterality Date  . CESAREAN SECTION    . CHOLECYSTECTOMY    . COLONOSCOPY    . LAPAROSCOPIC HYSTERECTOMY N/A 10/29/2014   Procedure: HYSTERECTOMY TOTAL LAPAROSCOPIC, OMENTUMECTOMY;  Surgeon: Gillis Ends, MD;  Location: [facility] ORS;  Service: Gynecology;  Laterality: N/A;  . LAPAROSCOPIC OOPHERECTOMY    . OOPHORECTOMY    . PORTA CATH REMOVAL N/A 04/26/2018   Procedure: PORTA CATH REMOVAL;  Surgeon: Algernon Huxley, MD;  Location: [place] [facility];  Service: Cardiovascular;  Laterality: N/A;  . UPPER GI ENDOSCOPY     Social History   Tobacco Use  . Smoking status: Former Smoker    Quit date: 04/05/1991    Years since quitting: 29.1  . Smokeless tobacco: Never Used  Vaping Use  . Vaping Use: Never used  Substance Use Topics  . Alcohol use: Yes    Alcohol/week: 0.0 standard drinks    Comment: 1 drink of wine daily  . Drug use: No   Family History  Problem Relation Age of Onset  . Hypertension Mother        36yo   . Hyperlipidemia Mother   . GER disease Mother   . Heart disease Father        93yo  . Breast cancer Sister 16  . Heart disease Paternal Grandmother   . Breast cancer Paternal Grandmother 30       deceased 36  . Kidney disease Neg Hx   . Liver disease Neg Hx   . Colon cancer Neg Hx   . Colon polyps Neg Hx   . Esophageal cancer Neg Hx   . Pancreatic cancer Neg Hx    Allergies  Allergen Reactions  . Ciprofloxacin Anaphylaxis  . Paclitaxel Shortness Of Breath and Other (See Comments)    Other reaction(s): Tight chest (finding) Chest and facial flushing Flushing, chest tightness, SOB w/ Taxol on 05/26/14 at [facility].  . Flagyl [Metronidazole] Other (See Comments)    headache  . Nutmeg Oil (Myristica Oil) Other (See Comments)    Tightness in chest and throat and difficulty swallowing and breathing.   Current Outpatient Medications on File Prior to Visit  Medication Sig Dispense Refill  . acetaminophen (TYLENOL) 500 MG tablet Take 2 tablets (1,000 mg total) by mouth every 6 (six) hours. 30 tablet 0  . fluticasone (FLONASE) 50 MCG/ACT nasal spray Place 2 sprays into both nostrils daily. 16 g 6  . pantoprazole (PROTONIX) 40 MG tablet Take 1 tablet (40 mg total) by mouth daily. 90 tablet 3  . meclizine (ANTIVERT) 25 MG  tablet Take 1 tablet (25 mg total) by mouth 3 (three) times daily as needed for dizziness. Caution of sedation (Patient not taking: Reported on 05/11/2020) 30 tablet 0   Current Facility-Administered Medications on File Prior to Visit  Medication Dose Route Frequency Provider Last Rate Last Admin  . sodium chloride 0.9 % injection 10 mL  10 mL Intravenous PRN Forest Gleason, MD   10 mL at 11/19/14 1628  . sodium chloride flush (NS) 0.9 % injection 10 mL  10 mL Intravenous PRN Cammie Sickle, MD   10 mL at 10/28/15 1135  . sodium chloride flush (NS) 0.9 % injection 10 mL  10 mL Intravenous PRN Cammie Sickle, MD   10 mL at 03/20/17 0831    Review of Systems  Constitutional: Negative for activity change, appetite change, fatigue, fever and unexpected weight change.  HENT: Negative for congestion, ear pain, rhinorrhea, sinus pressure and sore throat.   Eyes: Negative for pain, redness and visual disturbance.  Respiratory: Negative for cough, shortness of breath and wheezing.   Cardiovascular: Negative for chest pain and palpitations.  Gastrointestinal: Negative for abdominal pain, blood in stool, constipation and diarrhea.       Much less heartburn  Endocrine: Negative for polydipsia and polyuria.  Genitourinary: Negative for dysuria, frequency and urgency.   Musculoskeletal: Negative for arthralgias, back pain and myalgias.  Skin: Negative for pallor and rash.  Allergic/Immunologic: Negative for environmental allergies.  Neurological: Positive for dizziness. Negative for syncope and headaches.       Mild positional dizziness (feels off balance -no longer spinning)   Hematological: Negative for adenopathy. Does not bruise/bleed easily.  Psychiatric/Behavioral: Negative for decreased concentration and dysphoric mood. The patient is not nervous/anxious.        Objective:   Physical Exam Constitutional:      General: She is not in acute distress.    Appearance: Normal appearance. She is well-developed. She is not ill-appearing or diaphoretic.  HENT:     Head: Normocephalic and atraumatic.     Right Ear: Tympanic membrane, ear canal and external ear normal. There is no impacted cerumen.     Left Ear: Tympanic membrane, ear canal and external ear normal. There is no impacted cerumen.  Eyes:     General: No scleral icterus.    Extraocular Movements: Extraocular movements intact.     Conjunctiva/sclera: Conjunctivae normal.     Pupils: Pupils are equal, round, and reactive to light.  Neck:     Thyroid: No thyromegaly.     Vascular: No carotid bruit or JVD.  Cardiovascular:     Rate and Rhythm: Normal rate and regular rhythm.     Pulses: Normal pulses.     Heart sounds: Normal heart sounds. No gallop.   Pulmonary:     Effort: Pulmonary effort is normal. No respiratory distress.     Breath sounds: Normal breath sounds. No wheezing.     Comments: Good air exch Chest:     Chest wall: No tenderness.  Abdominal:     General: Bowel sounds are normal. There is no distension or abdominal bruit.     Palpations: Abdomen is soft. There is no mass.     Tenderness: There is no abdominal tenderness.     Hernia: No hernia is present.  Genitourinary:    Comments: Breast exam: No mass, nodules, thickening, tenderness, bulging, retraction, inflamation,  nipple discharge or skin changes noted.  No axillary or clavicular LA.     Musculoskeletal:  General: Normal range of motion.     Cervical back: Normal range of motion and neck supple. No rigidity. No muscular tenderness.     Right lower leg: No edema.     Left lower leg: No edema.  Lymphadenopathy:     Cervical: No cervical adenopathy.  Skin:    General: Skin is warm and dry.     Coloration: Skin is not pale.     Findings: No erythema or rash.  Neurological:     Mental Status: She is alert. Mental status is at baseline.     Cranial Nerves: No cranial nerve deficit.     Motor: No weakness, tremor or abnormal muscle tone.     Coordination: Coordination is intact. Romberg sign negative. Coordination normal.     Gait: Gait normal.     Deep Tendon Reflexes: Reflexes are normal and symmetric. Reflexes normal.  Psychiatric:        Mood and Affect: Mood normal.        Cognition and Memory: Cognition and memory normal.           Assessment & Plan:   Problem List Items Addressed This Visit      Digestive   GERD (gastroesophageal reflux disease)    Much improved Off the tagamet now  Taking protonix 40 mg daily  Doing well if she eats correctly  Will plan to try cutting pill in 1/2 in a month and let me know         Other   Vertigo - Primary    Improved but not gone  No HA or other symptoms  bp is low normal w/o orthostatics flonase helps her sinus congestion  Plans to f/u with ENT this week for another epeley maneuver  Consider further w/u if this does not continue to improve ? If this could have started with a virus (per pt home covid test was neg)

## 2020-05-11 NOTE — Assessment & Plan Note (Signed)
Improved but not gone  No HA or other symptoms  bp is low normal w/o orthostatics flonase helps her sinus congestion  Plans to f/u with ENT this week for another epeley maneuver  Consider further w/u if this does not continue to improve ? If this could have started with a virus (per pt home covid test was neg)

## 2020-05-11 NOTE — Patient Instructions (Signed)
Keep watching diet for acid reflux  Continue protonix 40 for a month  Then cut in 1/2 and see how you do (if worse you can go back to a whole)   Glad dizziness is slowly improving  Continue ENT follow up for now  If no further improvement let us know

## 2020-05-11 NOTE — Assessment & Plan Note (Signed)
Much improved Off the tagamet now  Taking protonix 40 mg daily  Doing well if she eats correctly  Will plan to try cutting pill in 1/2 in a month and let me know

## 2020-05-25 ENCOUNTER — Ambulatory Visit: Payer: 59 | Admitting: Family Medicine

## 2020-08-11 DIAGNOSIS — L308 Other specified dermatitis: Secondary | ICD-10-CM | POA: Diagnosis not present

## 2020-08-11 DIAGNOSIS — L821 Other seborrheic keratosis: Secondary | ICD-10-CM | POA: Diagnosis not present

## 2020-08-11 DIAGNOSIS — L218 Other seborrheic dermatitis: Secondary | ICD-10-CM | POA: Diagnosis not present

## 2020-08-11 DIAGNOSIS — Z1283 Encounter for screening for malignant neoplasm of skin: Secondary | ICD-10-CM | POA: Diagnosis not present

## 2020-08-12 ENCOUNTER — Ambulatory Visit (INDEPENDENT_AMBULATORY_CARE_PROVIDER_SITE_OTHER)
Admission: RE | Admit: 2020-08-12 | Discharge: 2020-08-12 | Disposition: A | Discharge status: Home / Self Care | Payer: BC Managed Care – PPO | Source: Ambulatory Visit | Attending: Family Medicine | Admitting: Family Medicine

## 2020-08-12 ENCOUNTER — Ambulatory Visit: Payer: BC Managed Care – PPO | Admitting: Family Medicine

## 2020-08-12 ENCOUNTER — Encounter: Payer: Self-pay | Admitting: Family Medicine

## 2020-08-12 ENCOUNTER — Other Ambulatory Visit: Payer: Self-pay

## 2020-08-12 VITALS — BP 110/68 | HR 85 | Temp 96.9°F | Ht 62.0 in | Wt 140.6 lb

## 2020-08-12 DIAGNOSIS — R0789 Other chest pain: Secondary | ICD-10-CM | POA: Insufficient documentation

## 2020-08-12 DIAGNOSIS — Z8543 Personal history of malignant neoplasm of ovary: Secondary | ICD-10-CM | POA: Diagnosis not present

## 2020-08-12 DIAGNOSIS — K219 Gastro-esophageal reflux disease without esophagitis: Secondary | ICD-10-CM

## 2020-08-12 DIAGNOSIS — R0781 Pleurodynia: Secondary | ICD-10-CM | POA: Diagnosis not present

## 2020-08-12 NOTE — Assessment & Plan Note (Signed)
Left upper anterior chest with tenderness  No pleuritic pain  Reassuring exam  H/o ovarian cancer in the past cxr and rib films ordered Nl breast exam  Adv use of voltaren gel and heat prn  Pending rad review Update if not starting to improve in a week or if worsening

## 2020-08-12 NOTE — Progress Notes (Signed)
Subjective:    Patient ID: Debbie Richardson, female    DOB: 1960/04/20, 60 y.o.   MRN: [id number]  This visit occurred during the SARS-CoV-2 public health emergency.  Safety protocols were in place, including screening questions prior to the visit, additional usage of staff PPE, and extensive cleaning of exam room while observing appropriate contact time as indicated for disinfecting solutions.    HPI Pt presents for f/u of GERD symptoms   Wt Readings from Last 3 Encounters:  08/12/20 140 lb 9 oz (63.8 kg)  05/11/20 139 lb 2 oz (63.1 kg)  04/06/20 133 lb (60.3 kg)   25.71 kg/m  Heartburn seems to be re surfacing the last few weeks (she weaned off the protonix in the past when she improved) then started back 3 days ago  More on the L side (also soreness, tenderness)  It started 2 wk ago  The ppi is helping some   No bruising or swelling  No breast lumps  (mammogram was last July)   She has been more active lately   Discomfort was off/on and now more constant  Some burning like typical heartburn, some burping  To the touch- dull pain   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 Chest wall pain 08/12/2020  . Lipid screening 06/21/2019  . History of anaphylaxis 06/21/2019  . Screening mammogram, encounter for 01/07/2019  . History of ovarian cancer 10/03/2018  . Bloating 08/02/2017  . Menopause 09/14/2016  . Neoplasm, ovary, malignant, unspecified laterality ([place]) 12/09/2015  . Left flank pain 06/22/2015  . Abdominal tenderness of left lower quadrant 06/22/2015  . Lower abdominal pain 11/26/2014  . Status post laparoscopic hysterectomy 10/29/2014  . Allergic conjunctivitis and rhinitis 07/04/2012  . ETD (eustachian tube dysfunction) 07/02/2007  . GERD (gastroesophageal reflux disease) 05/01/2007  . PSORIASIS NEC  12/07/2006  . Vertigo 08/25/2006   Past Medical History:  Diagnosis Date  . Anxiety   . Arthritis   . Benign fundic gland polyps of stomach   . Dizziness    vertigo  . Gallstones   . GERD (gastroesophageal reflux disease)   . Ovarian cancer ([place]) 2016  . Personal history of chemotherapy    Past Surgical History:  Procedure Laterality Date  . CESAREAN SECTION    . CHOLECYSTECTOMY    . COLONOSCOPY    . LAPAROSCOPIC HYSTERECTOMY N/A 10/29/2014   Procedure: HYSTERECTOMY TOTAL LAPAROSCOPIC, OMENTUMECTOMY;  Surgeon: Gillis Ends, MD;  Location: [facility]S;  Service: Gynecology;  Laterality: N/A;  . LAPAROSCOPIC OOPHERECTOMY    . OOPHORECTOMY    . PORTA CATH REMOVAL N/A 04/26/2018   Procedure: PORTA CATH REMOVAL;  Surgeon: Algernon Huxley, MD;  Location: [facility];  Service: Cardiovascular;  Laterality: N/A;  . UPPER GI ENDOSCOPY     Social History   Tobacco Use  . Smoking status: Former Smoker    Quit date: 04/05/1991    Years since quitting: 29.3  . Smokeless tobacco: Never Used  Vaping Use  . Vaping Use: Never used  Substance Use Topics  . Alcohol use: Yes    Alcohol/week: 0.0 standard drinks    Comment: 1 drink of wine daily  . Drug use: No   Family History  Problem Relation Age of Onset  . Hypertension Mother  60yo   . Hyperlipidemia Mother   . GER disease Mother   . Heart disease Father        21yo  . Breast cancer Sister 4  . Heart disease Paternal Grandmother   . Breast cancer Paternal Grandmother 20       deceased 58  . Kidney disease Neg Hx   . Liver disease Neg Hx   . Colon cancer Neg Hx   . Colon polyps Neg Hx   . Esophageal cancer Neg Hx   . Pancreatic cancer Neg Hx    Allergies  Allergen Reactions  . Ciprofloxacin Anaphylaxis  . Paclitaxel Shortness Of Breath and Other (See Comments)    Other reaction(s): Tight chest (finding) Chest and facial flushing Flushing, chest tightness, SOB w/ Taxol on 05/26/14 at [facility].   . Flagyl [Metronidazole] Other (See Comments)    headache  . Nutmeg Oil (Myristica Oil) Other (See Comments)    Tightness in chest and throat and difficulty swallowing and breathing.   Current Outpatient Medications on File Prior to Visit  Medication Sig Dispense Refill  . acetaminophen (TYLENOL) 500 MG tablet Take 2 tablets (1,000 mg total) by mouth every 6 (six) hours. 30 tablet 0  . fluticasone (FLONASE) 50 MCG/ACT nasal spray Place 2 sprays into both nostrils daily. 16 g 6  . meclizine (ANTIVERT) 25 MG tablet Take 1 tablet (25 mg total) by mouth 3 (three) times daily as needed for dizziness. Caution of sedation 30 tablet 0  . pantoprazole (PROTONIX) 40 MG tablet Take 1 tablet (40 mg total) by mouth daily. 90 tablet 3   Current Facility-Administered Medications on File Prior to Visit  Medication Dose Route Frequency Provider Last Rate Last Admin  . sodium chloride 0.9 % injection 10 mL  10 mL Intravenous PRN Forest Gleason, MD   10 mL at 11/19/14 1628  . sodium chloride flush (NS) 0.9 % injection 10 mL  10 mL Intravenous PRN Cammie Sickle, MD   10 mL at 10/28/15 1135  . sodium chloride flush (NS) 0.9 % injection 10 mL  10 mL Intravenous PRN Cammie Sickle, MD   10 mL at 03/20/17 0831    Review of Systems  Constitutional: Negative for activity change, appetite change, fatigue, fever and unexpected weight change.  HENT: Negative for congestion, ear pain, rhinorrhea, sinus pressure and sore throat.   Eyes: Negative for pain, redness and visual disturbance.  Respiratory: Negative for cough, shortness of breath and wheezing.   Cardiovascular: Negative for chest pain and palpitations.  Gastrointestinal: Negative for abdominal pain, blood in stool, constipation, diarrhea, nausea and vomiting.       Heartburn  Endocrine: Negative for polydipsia and polyuria.  Genitourinary: Negative for dysuria, frequency and urgency.  Musculoskeletal: Negative for arthralgias, back pain and  myalgias.       Left sided chest/rib soreness  Skin: Negative for pallor and rash.  Allergic/Immunologic: Negative for environmental allergies.  Neurological: Negative for dizziness, syncope and headaches.  Hematological: Negative for adenopathy. Does not bruise/bleed easily.  Psychiatric/Behavioral: Negative for decreased concentration and dysphoric mood. The patient is not nervous/anxious.        Objective:   Physical Exam Constitutional:      General: She is not in acute distress.    Appearance: Normal appearance. She is well-developed and normal weight. She is not ill-appearing.  HENT:     Head: Normocephalic and atraumatic.  Eyes:     General: No scleral icterus.  Conjunctiva/sclera: Conjunctivae normal.     Pupils: Pupils are equal, round, and reactive to light.  Cardiovascular:     Rate and Rhythm: Normal rate and regular rhythm.     Heart sounds: Normal heart sounds.  Pulmonary:     Effort: Pulmonary effort is normal. No respiratory distress.     Breath sounds: Normal breath sounds. No wheezing or rales.     Comments: L anterior rib tenderness  No skin change or crepitus Chest:     Chest wall: Tenderness present.  Abdominal:     General: Abdomen is flat. Bowel sounds are normal. There is no distension or abdominal bruit.     Palpations: Abdomen is soft. There is no hepatomegaly, splenomegaly, mass or pulsatile mass.     Tenderness: There is abdominal tenderness in the epigastric area. There is no guarding or rebound. Negative signs include Murphy's sign.     Comments: Very mild epigastric tenderness  Genitourinary:    Comments: Breast exam: No mass, nodules, thickening,  bulging, retraction, inflamation, nipple discharge or skin changes noted.  No axillary or clavicular LA.     L side- mild lateral chest wall tenderness Musculoskeletal:     Cervical back: Normal range of motion and neck supple. No tenderness.  Lymphadenopathy:     Cervical: No cervical adenopathy.   Skin:    General: Skin is warm and dry.     Coloration: Skin is not pale.     Findings: No erythema or rash.  Neurological:     Mental Status: She is alert.     Cranial Nerves: No cranial nerve deficit.     Coordination: Coordination normal.     Deep Tendon Reflexes: Reflexes normal.  Psychiatric:        Mood and Affect: Mood normal.           Assessment & Plan:   Problem List Items Addressed This Visit      Digestive   GERD (gastroesophageal reflux disease)    Return of some heartburn/mild  Inc stress may be trigger She is avoiding  Now back on protonix 40 mg daily and starting to improve Recommend watching diet  Elevate head of bed if needed  Update if no further improvement          Other   Chest wall pain - Primary    Left upper anterior chest with tenderness  No pleuritic pain  Reassuring exam  H/o ovarian cancer in the past cxr and rib films ordered Nl breast exam  Adv use of voltaren gel and heat prn  Pending rad review Update if not starting to improve in a week or if worsening        Relevant Orders   DG Chest 2 View   DG Ribs Unilateral Left

## 2020-08-12 NOTE — Assessment & Plan Note (Signed)
Return of some heartburn/mild  Inc stress may be trigger She is avoiding  Now back on protonix 40 mg daily and starting to improve Recommend watching diet  Elevate head of bed if needed  Update if no further improvement

## 2020-08-12 NOTE — Patient Instructions (Signed)
Continue the protonix  Watch diet   Try a warm compress on left chest/under arm  You can get voltaren gel - use as directed on the sore area   Continue to avoid caffeine  Avoid carbonation if you can   Avoid heavy lift/push/pull   Xray now

## 2020-09-08 ENCOUNTER — Telehealth: Payer: Self-pay | Admitting: *Deleted

## 2020-09-08 ENCOUNTER — Other Ambulatory Visit: Payer: Self-pay | Admitting: Nurse Practitioner

## 2020-09-08 NOTE — Telephone Encounter (Signed)
Patient called requesting Lauren order a CA 125 to be drawn on her

## 2020-09-08 NOTE — Telephone Encounter (Signed)
Please check if labs are in pt is coming Friday

## 2020-09-08 NOTE — Telephone Encounter (Signed)
Patient has standing orders for ca125. Please route to scheduling to schedule lab appointment. Thanks!

## 2020-09-11 ENCOUNTER — Inpatient Hospital Stay: Payer: BC Managed Care – PPO | Attending: Obstetrics and Gynecology

## 2020-09-11 DIAGNOSIS — C569 Malignant neoplasm of unspecified ovary: Secondary | ICD-10-CM | POA: Diagnosis not present

## 2020-09-12 LAB — CA 125: Cancer Antigen (CA) 125: 8.6 U/mL (ref 0.0–38.1)

## 2020-10-06 ENCOUNTER — Encounter: Payer: Self-pay | Admitting: Family Medicine

## 2020-10-06 ENCOUNTER — Telehealth: Payer: BC Managed Care – PPO | Admitting: Family Medicine

## 2020-10-06 VITALS — Temp 98.1°F | Wt 140.0 lb

## 2020-10-06 DIAGNOSIS — R0981 Nasal congestion: Secondary | ICD-10-CM

## 2020-10-06 NOTE — Progress Notes (Signed)
Virtual Visit via Video Note  I connected with Debbie Richardson  on 10/06/20 at  3:20 PM EDT by a video enabled telemedicine application and verified that I am speaking with the correct person using two identifiers.  Location patient: home, [place] Location provider:work or home office Persons participating in the virtual visit: patient, provider  I discussed the limitations of evaluation and management by telemedicine and the availability of in person appointments. The patient expressed understanding and agreed to proceed.   HPI:  Acute telemedicine visit for sinus issues: -Onset: yesterday -2 covid tests at home were negative -Symptoms include: sinus congestion, some sinus discomfort, headache -reports " I don't feel too bad" -Denies: fever, CP, SOB, NVD, body aches, known sick contacts -Has tried: a decongestant, flonase -Pertinent past medical history: seasonal allergies -Pertinent medication allergies: Allergies  Allergen Reactions   Ciprofloxacin Anaphylaxis   Paclitaxel Shortness Of Breath and Other (See Comments)    Other reaction(s): Tight chest (finding) Chest and facial flushing Flushing, chest tightness, SOB w/ Taxol on 05/26/14 at [facility].   Flagyl [Metronidazole] Other (See Comments)    headache   Nutmeg Oil (Myristica Oil) Other (See Comments)    Tightness in chest and throat and difficulty swallowing and breathing.  -COVID-19 vaccine status: vaccinated x2  ROS: See pertinent positives and negatives per HPI.  Past Medical History:  Diagnosis Date   Anxiety    Arthritis    Benign fundic gland polyps of stomach    Dizziness    vertigo   Gallstones    GERD (gastroesophageal reflux disease)    Ovarian cancer ([place]) 2016   Personal history of chemotherapy     Past Surgical History:  Procedure Laterality Date   CESAREAN SECTION     CHOLECYSTECTOMY     COLONOSCOPY     LAPAROSCOPIC HYSTERECTOMY N/A 10/29/2014   Procedure: HYSTERECTOMY TOTAL LAPAROSCOPIC,  OMENTUMECTOMY;  Surgeon: Gillis Ends, MD;  Location: [facility];  Service: Gynecology;  Laterality: N/A;   LAPAROSCOPIC OOPHERECTOMY     OOPHORECTOMY     PORTA CATH REMOVAL N/A 04/26/2018   Procedure: PORTA CATH REMOVAL;  Surgeon: Algernon Huxley, MD;  Location: [facility];  Service: Cardiovascular;  Laterality: N/A;   UPPER GI ENDOSCOPY       Current Outpatient Medications:    acetaminophen (TYLENOL) 500 MG tablet, Take 2 tablets (1,000 mg total) by mouth every 6 (six) hours., Disp: 30 tablet, Rfl: 0   pantoprazole (PROTONIX) 40 MG tablet, Take 1 tablet (40 mg total) by mouth daily., Disp: 90 tablet, Rfl: 3 No current facility-administered medications for this visit.  Facility-Administered Medications Ordered in Other Visits:    sodium chloride 0.9 % injection 10 mL, 10 mL, Intravenous, PRN, Choksi, Janak, MD, 10 mL at 11/19/14 1628   sodium chloride flush (NS) 0.9 % injection 10 mL, 10 mL, Intravenous, PRN, Charlaine Dalton R, MD, 10 mL at 10/28/15 1135   sodium chloride flush (NS) 0.9 % injection 10 mL, 10 mL, Intravenous, PRN, Cammie Sickle, MD, 10 mL at 03/20/17 0831  EXAM:  VITALS per patient if applicable:  GENERAL: alert, oriented, appears well and in no acute distress  HEENT: atraumatic, conjunttiva clear, no obvious abnormalities on inspection of external nose and ears  NECK: normal movements of the head and neck  LUNGS: on inspection no signs of respiratory distress, breathing rate appears normal, no obvious gross SOB, gasping or wheezing  CV: no obvious cyanosis  MS: moves all visible extremities  without noticeable abnormality  PSYCH/NEURO: pleasant and cooperative, no obvious depression or anxiety, speech and thought processing grossly intact  ASSESSMENT AND PLAN:  Discussed the following assessment and plan:  Nasal congestion  -we discussed possible serious and likely etiologies, options for evaluation and workup, limitations of  telemedicine visit vs in person visit, treatment, treatment risks and precautions. Pt prefers to treat via telemedicine empirically rather than in person at this moment. Query VURI, allergic rhinitis, possible covid with false neg testing as did tests early in course, vs other. Opted for retest day 4 for covid, nasal saline and other care measures per patient instructions.  Work/School slipped offered: declined Advised to seek prompt in person care if worsening, new symptoms arise, or if is not improving with treatment. Discussed options for inperson care if PCP office not available. Did let this patient know that I only do telemedicine on Tuesdays and Thursdays for [facility]. Advised to schedule follow up visit with PCP or [facility] if any further questions or concerns to avoid delays in care.   I discussed the assessment and treatment plan with the patient. The patient was provided an opportunity to ask questions and all were answered. The patient agreed with the plan and demonstrated an understanding of the instructions.     Lucretia Kern, DO

## 2020-10-06 NOTE — Patient Instructions (Signed)
  HOME CARE TIPS:  -[place] testing information: [email or web address] OR [phone] Most pharmacies also offer testing and home test kits. If the Covid19 test is positive, please make a prompt follow up visit with your primary care office or with [facility] to discuss treatment options. Treatments for Covid19 are best given early in the course of the illness.   -can use tylenol or aleve if needed for fevers, aches and pains per instructions  -can use nasal saline a few times per day if you have nasal congestion; sometimes  a short course of Afrin nasal spray for 3 days can help with symptoms as well  -stay hydrated, drink plenty of fluids and eat small healthy meals - avoid dairy  -can take 1000 IU (63mcg) Vit D3 and 100-500 mg of Vit C daily per instructions  -If the Covid test is positive, check out the [facility] website for more information on home care, transmission and treatment for COVID19  -follow up with your doctor in 2-3 days unless improving and feeling better  It was nice to meet you today, and I really hope you are feeling better soon. I help  out with telemedicine visits on Tuesdays and Thursdays and am available for visits on those days. If you have any concerns or questions following this visit please schedule a follow up visit with your Primary Care doctor or seek care at a local urgent care clinic to avoid delays in care.    Seek in person care or schedule a follow up video visit promptly if your symptoms worsen, new concerns arise or you are not improving with treatment. Call 911 and/or seek emergency care if your symptoms are severe or life threatening.

## 2020-10-21 ENCOUNTER — Encounter: Payer: Self-pay | Admitting: Family Medicine

## 2020-10-21 ENCOUNTER — Telehealth (INDEPENDENT_AMBULATORY_CARE_PROVIDER_SITE_OTHER): Payer: BC Managed Care – PPO | Admitting: Family Medicine

## 2020-10-21 VITALS — Temp 99.0°F | Wt 138.0 lb

## 2020-10-21 DIAGNOSIS — J0100 Acute maxillary sinusitis, unspecified: Secondary | ICD-10-CM

## 2020-10-21 MED ORDER — AMOXICILLIN-POT CLAVULANATE 875-125 MG PO TABS: 1.0000 | ORAL_TABLET | Freq: Two times a day (BID) | ORAL | 0 refills | Status: DC

## 2020-10-21 MED ORDER — PREDNISONE 10 MG PO TABS: ORAL_TABLET | ORAL | 0 refills | Status: DC

## 2020-10-21 NOTE — Patient Instructions (Signed)
Drink fluids  Try nasal saline and breathe steam for congestion  Take augmentin as directed Take prednisone as directed (this may make you feel hyper and hungry)   Update if not starting to improve in a week or if worsening

## 2020-10-21 NOTE — Progress Notes (Signed)
Virtual Visit via Video Note  I connected with Debbie Richardson on 10/21/20 at  9:00 AM EDT by a video enabled telemedicine application and verified that I am speaking with the correct person using two identifiers.  Location: Patient: home Provider: office   I discussed the limitations of evaluation and management by telemedicine and the availability of in person appointments. The patient expressed understanding and agreed to proceed.  Parties involved in encounter  Patient:  Provider:  Loura Pardon MD   History of Present Illness: Pt presents for nasal congestion/sinus problems   Temp is 99 today   She had a virtual visit with Dr Maudie Mercury on 7/5 Noted 2 neg covid tests and nasal congestion  Never tested positive   Now right face/cheek and teeth  Thinks she has a sinus infection  Nasal d/c-not a lot (yellow) Some blood from nose  Eased up and then got worse Makes the R posterior part of head hurt also occ   Throat-fine (just some pnd in the am)  Ears-pop a little No vertigo  Cough- just a bit in the am  No n/v/d   Has used heat and ice   Otc Flonase Decongestants  Tylenol  Ibuprofen  Saline-at first  Patient Active Problem List   Diagnosis Date Noted   Chest wall pain 08/12/2020   Lipid screening 06/21/2019   History of anaphylaxis 06/21/2019   Screening mammogram, encounter for 01/07/2019   History of ovarian cancer 10/03/2018   Bloating 08/02/2017   Menopause 09/14/2016   Neoplasm, ovary, malignant, unspecified laterality ([place]) 12/09/2015   Acute sinusitis 08/11/2015   Left flank pain 06/22/2015   Abdominal tenderness of left lower quadrant 06/22/2015   Lower abdominal pain 11/26/2014   Status post laparoscopic hysterectomy 10/29/2014   Allergic conjunctivitis and rhinitis 07/04/2012   ETD (eustachian tube dysfunction) 07/02/2007   GERD (gastroesophageal reflux disease) 05/01/2007   PSORIASIS NEC 12/07/2006   Vertigo 08/25/2006   Past Medical  History:  Diagnosis Date   Anxiety    Arthritis    Benign fundic gland polyps of stomach    Dizziness    vertigo   Gallstones    GERD (gastroesophageal reflux disease)    Ovarian cancer ([place]) 2016   Personal history of chemotherapy    Past Surgical History:  Procedure Laterality Date   CESAREAN SECTION     CHOLECYSTECTOMY     COLONOSCOPY     LAPAROSCOPIC HYSTERECTOMY N/A 10/29/2014   Procedure: HYSTERECTOMY TOTAL LAPAROSCOPIC, OMENTUMECTOMY;  Surgeon: Gillis Ends, MD;  Location: [facility];  Service: Gynecology;  Laterality: N/A;   LAPAROSCOPIC OOPHERECTOMY     OOPHORECTOMY     PORTA CATH REMOVAL N/A 04/26/2018   Procedure: PORTA CATH REMOVAL;  Surgeon: Algernon Huxley, MD;  Location: [facility];  Service: Cardiovascular;  Laterality: N/A;   UPPER GI ENDOSCOPY     Social History   Tobacco Use   Smoking status: Former    Types: Cigarettes    Quit date: 04/05/1991    Years since quitting: 29.5   Smokeless tobacco: Never  Vaping Use   Vaping Use: Never used  Substance Use Topics   Alcohol use: Yes    Alcohol/week: 0.0 standard drinks    Comment: 1 drink of wine daily   Drug use: No   Family History  Problem Relation Age of Onset   Hypertension Mother        38yo    Hyperlipidemia Mother    GER disease  Mother    Heart disease Father        21yo   Breast cancer Sister 50   Heart disease Paternal Grandmother    Breast cancer Paternal Grandmother 33       deceased 31   Kidney disease Neg Hx    Liver disease Neg Hx    Colon cancer Neg Hx    Colon polyps Neg Hx    Esophageal cancer Neg Hx    Pancreatic cancer Neg Hx    Allergies  Allergen Reactions   Ciprofloxacin Anaphylaxis   Paclitaxel Shortness Of Breath and Other (See Comments)    Other reaction(s): Tight chest (finding) Chest and facial flushing Flushing, chest tightness, SOB w/ Taxol on 05/26/14 at [facility].   Flagyl [Metronidazole] Other (See Comments)    headache   Nutmeg Oil  (Myristica Oil) Other (See Comments)    Tightness in chest and throat and difficulty swallowing and breathing.   Current Outpatient Medications on File Prior to Visit  Medication Sig Dispense Refill   acetaminophen (TYLENOL) 500 MG tablet Take 2 tablets (1,000 mg total) by mouth every 6 (six) hours. 30 tablet 0   pantoprazole (PROTONIX) 40 MG tablet Take 1 tablet (40 mg total) by mouth daily. 90 tablet 3   Current Facility-Administered Medications on File Prior to Visit  Medication Dose Route Frequency Provider Last Rate Last Admin   sodium chloride 0.9 % injection 10 mL  10 mL Intravenous PRN Choksi, Delorise Shiner, MD   10 mL at 11/19/14 1628   sodium chloride flush (NS) 0.9 % injection 10 mL  10 mL Intravenous PRN Charlaine Dalton R, MD   10 mL at 10/28/15 1135   sodium chloride flush (NS) 0.9 % injection 10 mL  10 mL Intravenous PRN Cammie Sickle, MD   10 mL at 03/20/17 0831   Review of Systems  Constitutional:  Negative for chills, fever and malaise/fatigue.  HENT:  Positive for congestion and sinus pain. Negative for ear pain, hearing loss and sore throat.   Eyes:  Negative for blurred vision, discharge and redness.  Respiratory:  Negative for cough, sputum production, shortness of breath, wheezing and stridor.   Cardiovascular:  Negative for chest pain, palpitations and leg swelling.  Gastrointestinal:  Negative for abdominal pain, diarrhea, nausea and vomiting.  Musculoskeletal:  Negative for myalgias.  Skin:  Negative for rash.  Neurological:  Positive for headaches. Negative for dizziness.     Observations/Objective: Patient appears well, in no distress Weight is baseline  No facial swelling or asymmetry Right maxillary facial tenderness when pt palpates Normal voice-not hoarse and no slurred speech No obvious tremor or mobility impairment Moving neck and UEs normally Able to hear the call well  No cough or shortness of breath during interview  Talkative and mentally  sharp with no cognitive changes No skin changes on face or neck , no rash or pallor Affect is normal   Assessment and Plan: Problem List Items Addressed This Visit       Respiratory   Acute sinusitis - Primary    15 days s/p viral uri (not covid)  R maxillary  Px pred taper 30 mg and augmentin bid for 7d Adv to inc fluids/use saline/warm compresses and treat symptoms prn  Update if not starting to improve in a week or if worsening   Meds ordered this encounter  Medications   amoxicillin-clavulanate (AUGMENTIN) 875-125 MG tablet    Sig: Take 1 tablet by mouth 2 (two)  times daily.    Dispense:  14 tablet    Refill:  0   predniSONE (DELTASONE) 10 MG tablet    Sig: Take 3 pills once daily by mouth for 3 days, then 2 pills once daily for 3 days, then 1 pill once daily for 3 days and then stop    Dispense:  18 tablet    Refill:  0         Relevant Medications   amoxicillin-clavulanate (AUGMENTIN) 875-125 MG tablet   predniSONE (DELTASONE) 10 MG tablet     Follow Up Instructions: Drink fluids  Try nasal saline and breathe steam for congestion  Take augmentin as directed Take prednisone as directed (this may make you feel hyper and hungry)   Update if not starting to improve in a week or if worsening    I discussed the assessment and treatment plan with the patient. The patient was provided an opportunity to ask questions and all were answered. The patient agreed with the plan and demonstrated an understanding of the instructions.   The patient was advised to call back or seek an in-person evaluation if the symptoms worsen or if the condition fails to improve as anticipated.     Loura Pardon, MD

## 2020-10-21 NOTE — Assessment & Plan Note (Signed)
15 days s/p viral uri (not covid)  R maxillary  Px pred taper 30 mg and augmentin bid for 7d Adv to inc fluids/use saline/warm compresses and treat symptoms prn  Update if not starting to improve in a week or if worsening   Meds ordered this encounter  Medications  . amoxicillin-clavulanate (AUGMENTIN) 875-125 MG tablet    Sig: Take 1 tablet by mouth 2 (two) times daily.    Dispense:  14 tablet    Refill:  0  . predniSONE (DELTASONE) 10 MG tablet    Sig: Take 3 pills once daily by mouth for 3 days, then 2 pills once daily for 3 days, then 1 pill once daily for 3 days and then stop    Dispense:  18 tablet    Refill:  0

## 2020-10-25 IMAGING — CT CT ABD-PELV W/ CM
2 of 5 series · 16 of 46 positions shown, 18 images · IV contrast (iopamidol)
Comparison: CT scans 09/22/2017 and 02/27/2017

CLINICAL DATA: Abdominal pain and bloating. History of ovarian
cancer in 1914.

EXAM:
CT ABDOMEN AND PELVIS WITH CONTRAST
TECHNIQUE: Multidetector CT imaging of the abdomen and pelvis was performed
using the standard protocol following bolus administration of
intravenous contrast.
CONTRAST:  100mL 11PHC3-2CC IOPAMIDOL (11PHC3-2CC) INJECTION 61%

[Series 2: abd pelvis · axial · 0.73mm/px · z∈[-1514,-1099]mm · 13 of 93 slices shown, 15 images (1 of 2)]
[im 5/93  soft-tissue]
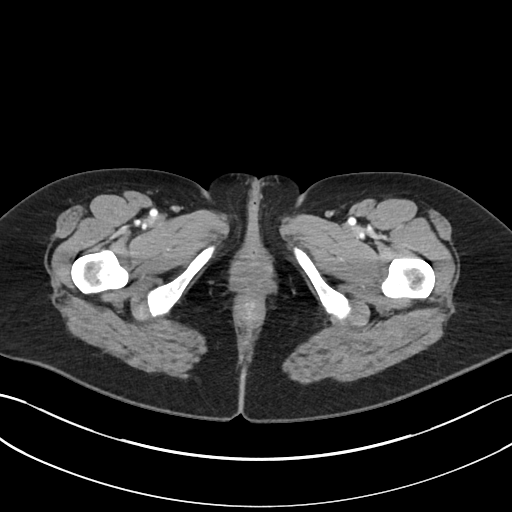
[im 5/93  bone]
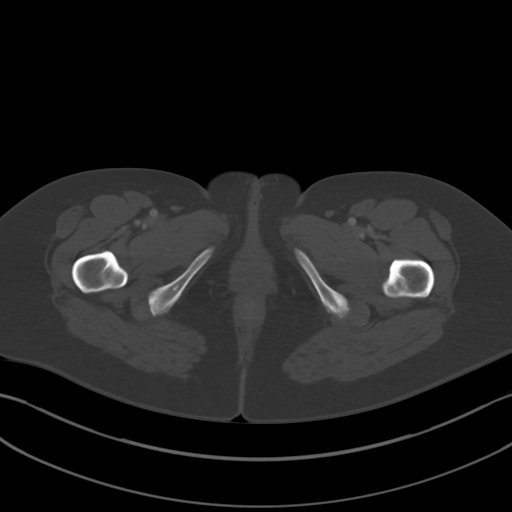
[im 15/93  soft-tissue]
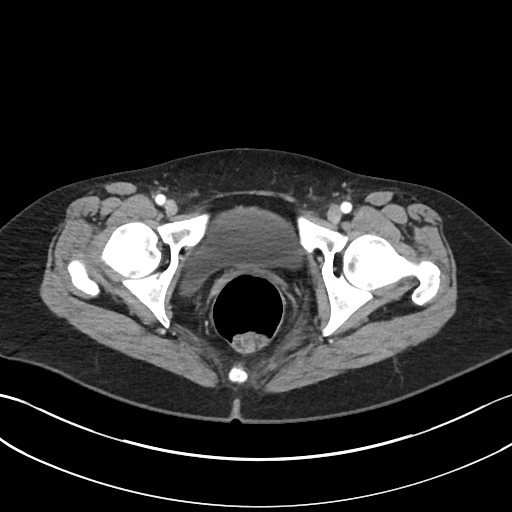
[im 20/93  soft-tissue]
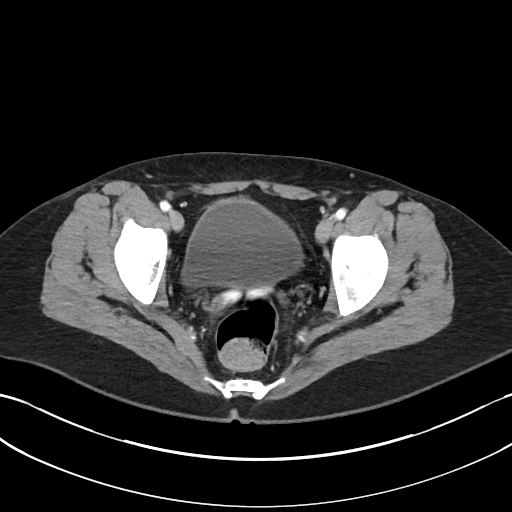
[im 25/93  soft-tissue]
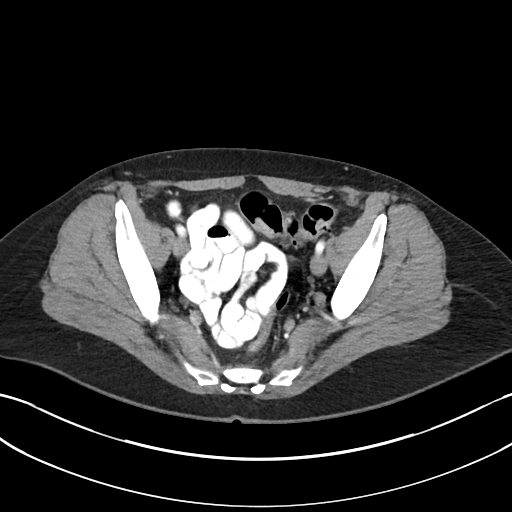
[im 34/93  soft-tissue]
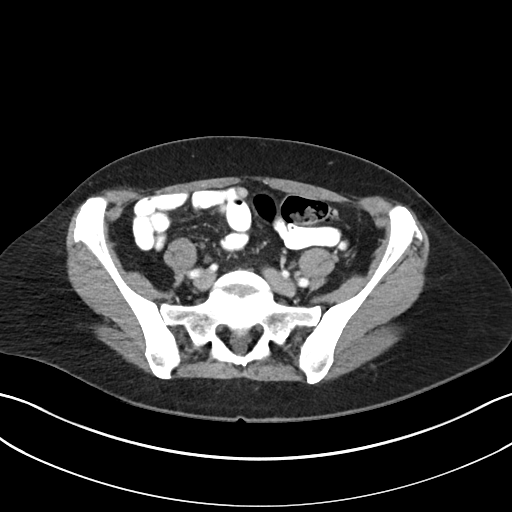
[im 39/93  soft-tissue]
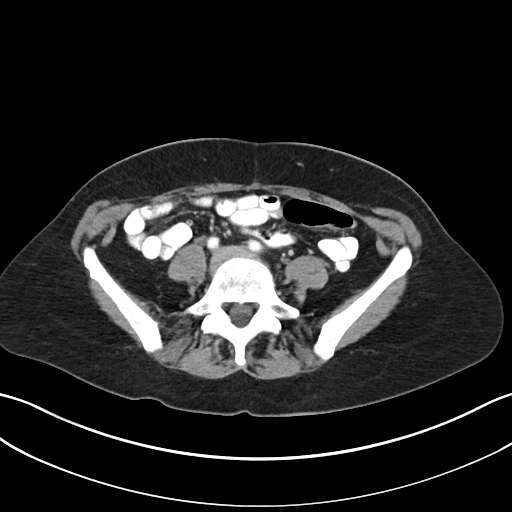
[im 49/93  soft-tissue]
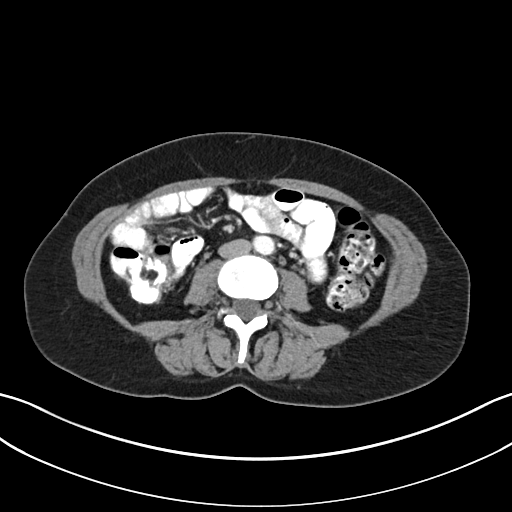
[im 54/93  soft-tissue]
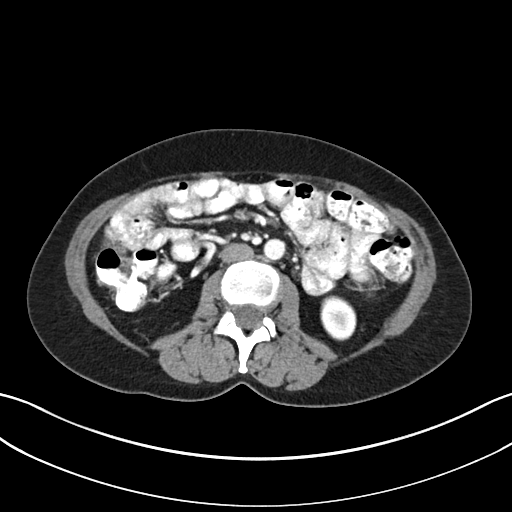
[im 59/93  soft-tissue]
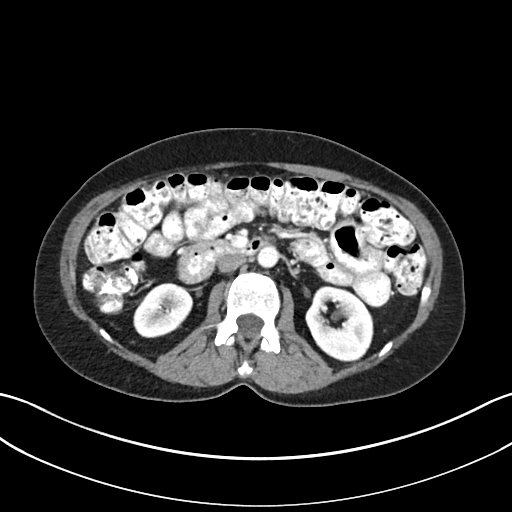
[im 59/93  bone]
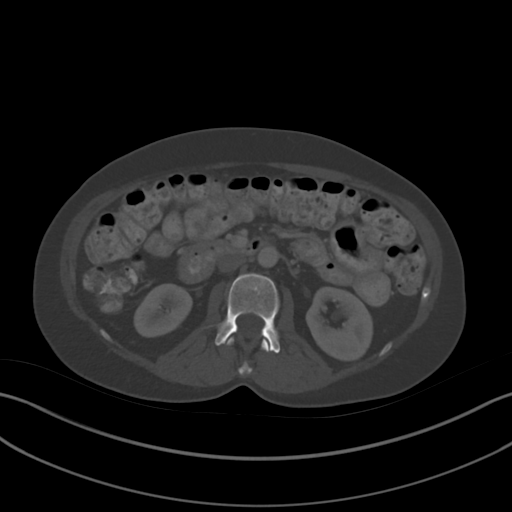
[im 68/93  soft-tissue]
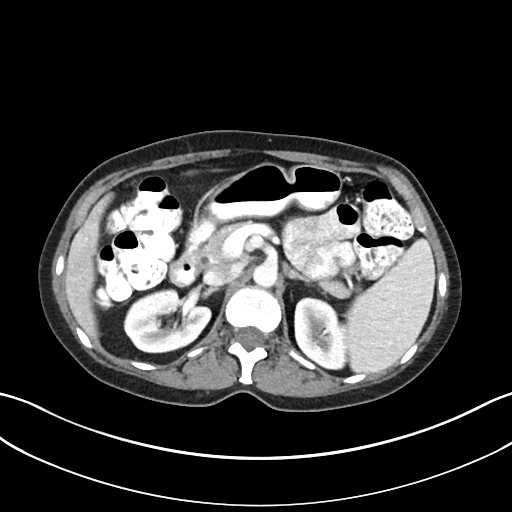
[im 73/93  soft-tissue]
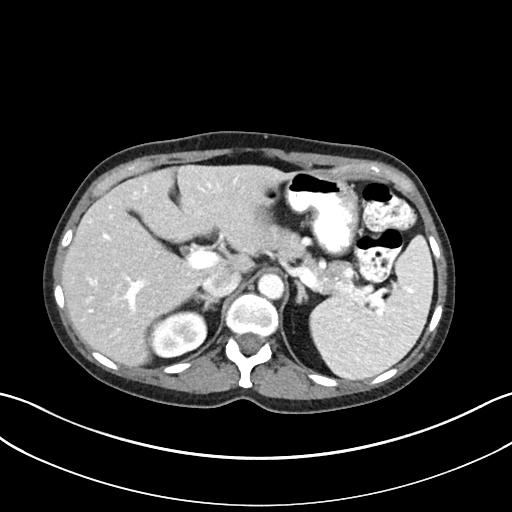
[im 78/93  soft-tissue]
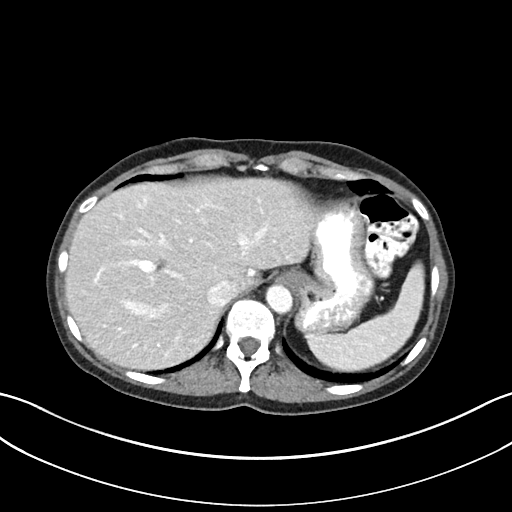
[im 88/93  soft-tissue]
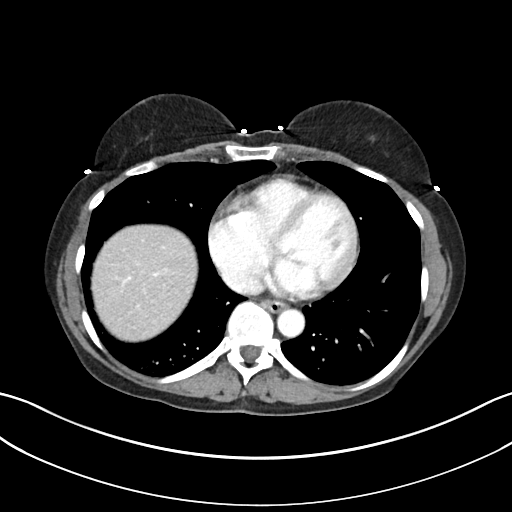

[Series 4: abd pelvis · coronal · 0.73mm/px · 3 of 123 slices shown (2 of 2)]
[im 41/123  soft-tissue]
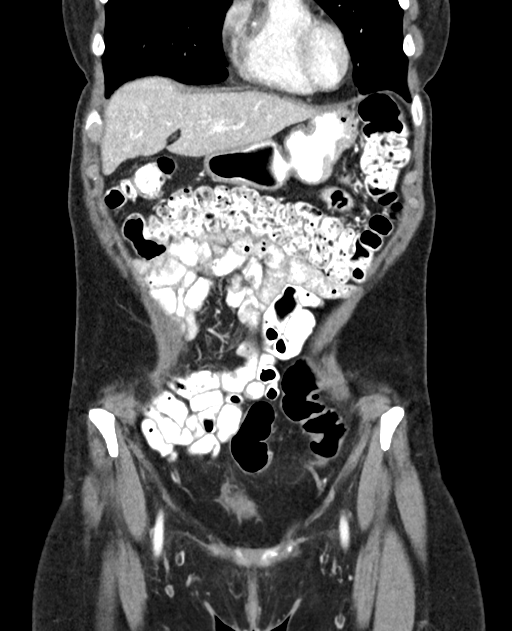
[im 55/123  soft-tissue]
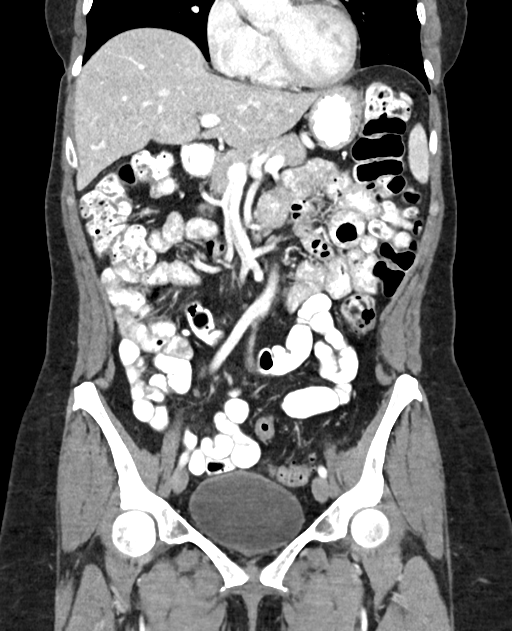
[im 68/123  soft-tissue]
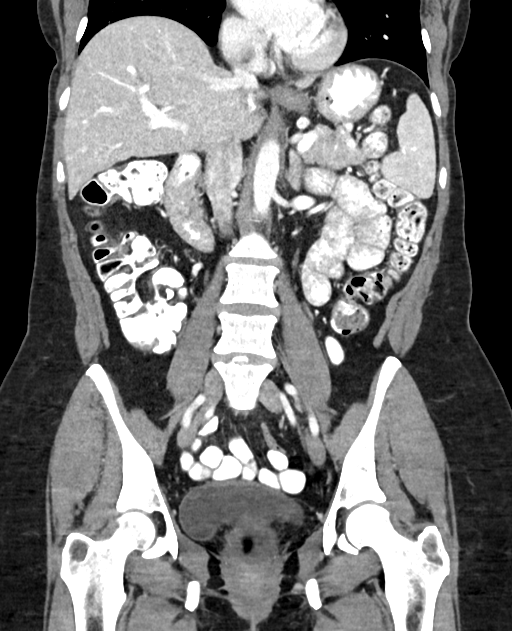

[16 of 46 positions shown; findings below may reference images not displayed]

FINDINGS: Lower chest: The lung bases are clear of acute process. No pleural
effusion or pulmonary lesions. The heart is normal in size. No
pericardial effusion. The distal esophagus and aorta are
unremarkable.

Hepatobiliary: No focal hepatic lesions or intrahepatic biliary
dilatation. The gallbladder is surgically absent. No common bile
duct dilatation.

Pancreas: No mass, inflammation or ductal dilatation.

Spleen: Normal size.  No focal lesions.

Adrenals/Urinary Tract: The adrenal glands and kidneys are normal.
The bladder is normal.

Stomach/Bowel: The stomach, duodenum, small bowel and colon are
unremarkable. No acute inflammatory changes, mass lesions or
obstructive findings. Scattered descending and sigmoid colon
diverticulosis but no findings for acute diverticulitis. The
terminal ileum and appendix are normal.

Vascular/Lymphatic: The aorta is normal in caliber. No dissection.
The branch vessels are patent. The major venous structures are
patent. No mesenteric or retroperitoneal mass or adenopathy. Small
scattered lymph nodes are noted.

Reproductive: Surgically absent.

Other: No omental or peritoneal soft tissue implants are identified
to suggest recurrent ovarian cancer. No free abdominal/pelvic fluid
collections. No inguinal mass or adenopathy.

Musculoskeletal: No significant bony findings. Slight bulging rim
calcified discs are noted at L4-5 and L5-S1.
IMPRESSION: 1. No acute abdominal/pelvic findings, mass lesions or adenopathy.
2. Status post hysterectomy and bilateral salpingo oophorectomy. No
worrisome peritoneal or omental implants to suggest recurrent
ovarian cancer.

## 2020-10-28 ENCOUNTER — Telehealth: Payer: Self-pay | Admitting: Nurse Practitioner

## 2020-10-28 NOTE — Telephone Encounter (Signed)
Patient states she had labs drawn in June and wonders if she still needs to come for labs on 8/15.  Routing to clinical team for follow up.

## 2020-10-29 NOTE — Telephone Encounter (Signed)
I will defer to Lauren since the June lab was added on at patients request.

## 2020-10-29 NOTE — Telephone Encounter (Signed)
Ok to hold off on CA125 per Lauren A.  Lab appointment cancelled and patient notified.

## 2020-11-16 ENCOUNTER — Other Ambulatory Visit: Payer: 59

## 2020-11-18 ENCOUNTER — Inpatient Hospital Stay: Payer: BC Managed Care – PPO | Attending: Obstetrics and Gynecology | Admitting: Obstetrics and Gynecology

## 2020-11-18 ENCOUNTER — Other Ambulatory Visit: Payer: Self-pay | Admitting: Family Medicine

## 2020-11-18 VITALS — BP 111/82 | HR 80 | Temp 98.7°F | Resp 20 | Wt 140.4 lb

## 2020-11-18 DIAGNOSIS — Z90722 Acquired absence of ovaries, bilateral: Secondary | ICD-10-CM | POA: Insufficient documentation

## 2020-11-18 DIAGNOSIS — M79622 Pain in left upper arm: Secondary | ICD-10-CM | POA: Diagnosis not present

## 2020-11-18 DIAGNOSIS — Z8543 Personal history of malignant neoplasm of ovary: Secondary | ICD-10-CM | POA: Diagnosis not present

## 2020-11-18 DIAGNOSIS — Z1231 Encounter for screening mammogram for malignant neoplasm of breast: Secondary | ICD-10-CM

## 2020-11-18 DIAGNOSIS — Z90710 Acquired absence of both cervix and uterus: Secondary | ICD-10-CM | POA: Insufficient documentation

## 2020-11-18 DIAGNOSIS — Z9221 Personal history of antineoplastic chemotherapy: Secondary | ICD-10-CM | POA: Insufficient documentation

## 2020-11-18 NOTE — Progress Notes (Signed)
Gynecologic Oncology Interval Note  Referring Provider: Dr. 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 She is on control arm of GOG225 and completed the active portion of the study on 03/15/17.   Last seen 11/13/19 with negative exam and normal CA 125. Last mammogram 10/18/19 reported as birads category 1: negative. Last colonoscopy in 2016 with recommendation to repeat in 2026.  CA-125 has been followed. Pre-operative CA-125 was 45.  11/05/19  10 05/06/19  9.0 01/04/2019 10.2 09/10/2018 9.7 06/13/2018 9.2 11/05/2019 10.0 09/11/2020     8.6  Today, she feels well and has no complaints other than left axillary pain that she attributed to a bra.  Her GI symptoms are stable.  She recently had a complete exam by her PCP Dr. Maudie Mercury on 10/06/2020.     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 See prior notes for complete details. She was taken emergently to the operating room on 04/28/2014 by Dr. Glennon Mac and a laparoscopic procedure was performed.  Bilateral salpingo-oophorectomy with  placement of the cystic lesions in the Endo Catch bag and removal through the LLQ port. Per the operative note the right ovary did rupture and contents of the cysts were spilled into the pelvis.  The final pathology revealed high-grade serous adenocarcinoma involving both ovaries and fallopian tubes. Washings also revealed clusters of highly atypical glandular cells and surface involvement of the ovary was present. Staging was felt to be stage II at least high grade serous ovarian cancer. Preoperative CA-125 was 45.   She received 6 cycles of carboplatin/taxane chemotherapy via an IV port, last  treatment June 2016.  She had an allergy to taxol and was switched to taxotere for the last 4 cycles. She underwent interval TLH/omentectomy on 10/29/2014 for ovarian cancer. Her final pathology was negative. She enrolled on GOG225.   CT scan 01/09/2015 negative  CT scan 06/24/2015 IMPRESSION: No evidence of urolithiasis, hydronephrosis, or other acute Findings.  Colonic diverticulosis. No radiographic evidence of diverticulitis.  CT scan of abdomen and pelvis 12/09/2015 IMPRESSION: Status post hysterectomy and bilateral salpingo-oophorectomy. No evidence of recurrent or metastatic disease  PET scan 09/19/2016 IMPRESSION: Negative PET-CT.  No findings for hypermetabolic recurrent tumor.  CT scan of abdomen and pelvis 02/27/2017  IMPRESSION: 1. No acute findings are noted in the abdomen or pelvis to account for the patient's symptoms. Notably, there is a low stool burden despite the patient's reported history of constipation. 2. Colonic diverticulosis without evidence of acute diverticulitis at this time. 3. Additional incidental findings, as above.   She had menopausal symptoms and we discussed Effexor therapy, but she did not pursue. She has used magnesium and felt that improved her symptoms.   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 Bailey Mech, GI. She has been on multiple antibiotics and Xifaxan with complete resolution of symptoms. But recurrent symptoms.    09/22/2017 CT scan A/P  IMPRESSION: 1. Uterus and ovaries absent. No pelvic mass. No adenopathy. No a mental lesions. No liver lesions evident. 2. No appreciable bowel wall thickening or bowel obstruction. There are occasional sigmoid diverticula without diverticulitis. No abscess in the abdomen or  pelvis. Appendix appears normal. 3. No evident renal or ureteral calculus on either side. No hydronephrosis.   12/13/2017 She saw Dr. Rogue Bussing on  with a  negative exam and CA 125 8.9.  03/06/2018  She saw Dr. Bailey Mech, GI, who recommended Suggest trial of activated charcoal tablets and LOW FODMAP diet for Small Intestinal Bacterial Overgrowth. Her symptoms have continued to worsen and she is really miserable.   03/14/2018.  Negative exam and normal Ca125 however, GI symptoms concerning for recurrence versus persistent small intestinal bacterial overgrowth syndrome. CT Abdomen Pelvis on 03/22/2018 showed no acute abdominal/pelvic findings, masses, or adenopathy. No worrisome peritoneal or omental implants.   Port was removed by Dr. Lucky Cowboy on 04/26/2018.    seen in July 2020 with negative exam and normal CA 125. GI symptoms at that time were improved. She has history of small intestinal bacterial overgrowth syndrome with previous negative imaging in December 2019.   CA-125 results postchemotherapy 02/11/2015  10.4 03/12/2015  10.7 06/10/2015  10.0 03/15/2016   10.1 06/09/2016  9.9 09/14/2016  10.9 12/14/2016  9.8 02/17/17  12.1 03/20/2017  9.9 06/04/2017  11.4 09/13/2017  9.5 12/13/2017  8.9 03/14/2018 10.4 06/13/2018 9.2 09/10/2018 9.7 01/04/2019 10.2 05/06/2019 9.0  Genetic testing- [employer] 83 gene Multi-Cancer Panel was negative for pathogenic mutation. She did have a VUS: SDHA c. 155C>T.   Screening mammogram - 12/13/2017 Screening Mammogram- Bi-rads category 1: negative   Problem List: Patient Active Problem List   Diagnosis Date Noted   Chest wall pain 08/12/2020   Lipid screening 06/21/2019   History of anaphylaxis 06/21/2019   Screening mammogram, encounter for 01/07/2019   History of ovarian cancer 10/03/2018   Bloating 08/02/2017   Menopause 09/14/2016   Neoplasm, ovary, malignant, unspecified laterality ([place]) 12/09/2015   Acute sinusitis 08/11/2015   Left flank pain 06/22/2015   Abdominal tenderness of left lower quadrant 06/22/2015   Lower abdominal pain 11/26/2014   Status post laparoscopic hysterectomy 10/29/2014   Allergic  conjunctivitis and rhinitis 07/04/2012   ETD (eustachian tube dysfunction) 07/02/2007   GERD (gastroesophageal reflux disease) 05/01/2007   PSORIASIS NEC 12/07/2006   Vertigo 08/25/2006    Past Medical History: Past Medical History:  Diagnosis Date   Anxiety    Arthritis    Benign fundic gland polyps of stomach    Dizziness    vertigo   Gallstones    GERD (gastroesophageal reflux disease)    Ovarian cancer ([place]) 2016   Personal history of chemotherapy     Past Surgical History: Past Surgical History:  Procedure Laterality Date   CESAREAN SECTION     CHOLECYSTECTOMY     COLONOSCOPY     LAPAROSCOPIC HYSTERECTOMY N/A 10/29/2014   Procedure: HYSTERECTOMY TOTAL LAPAROSCOPIC, OMENTUMECTOMY;  Surgeon: Gillis Ends, MD;  Location: [facility];  Service: Gynecology;  Laterality: N/A;   LAPAROSCOPIC OOPHERECTOMY     OOPHORECTOMY     PORTA CATH REMOVAL N/A 04/26/2018   Procedure: PORTA CATH REMOVAL;  Surgeon: Algernon Huxley, MD;  Location: [facility];  Service: Cardiovascular;  Laterality: N/A;   UPPER GI ENDOSCOPY      Family History: Family History  Problem Relation Age of Onset   Hypertension Mother        57yo    Hyperlipidemia Mother    GER disease Mother    Heart disease Father        48yo   Breast cancer Sister 56   Heart disease Paternal Grandmother  Breast cancer Paternal Grandmother 53       deceased 25   Kidney disease Neg Hx    Liver disease Neg Hx    Colon cancer Neg Hx    Colon polyps Neg Hx    Esophageal cancer Neg Hx    Pancreatic cancer Neg Hx     Social History: Social History   Socioeconomic History   Marital status: Divorced    Spouse name: Not on file   Number of children: 1   Years of education: Not on file   Highest education level: Not on file  Occupational History   Not on file  Tobacco Use   Smoking status: Former    Types: Cigarettes    Quit date: 04/05/1991    Years since quitting: 29.6   Smokeless tobacco: Never   Vaping Use   Vaping Use: Never used  Substance and Sexual Activity   Alcohol use: Yes    Alcohol/week: 0.0 standard drinks    Comment: 1 drink of wine daily   Drug use: No   Sexual activity: Yes  Other Topics Concern   Not on file  Social History Narrative   Not on file   Social Determinants of Health   Financial Resource Strain: Not on file  Food Insecurity: Not on file  Transportation Needs: Not on file  Physical Activity: Not on file  Stress: Not on file  Social Connections: Not on file  Intimate Partner Violence: Not on file    Allergies: Allergies  Allergen Reactions   Ciprofloxacin Anaphylaxis   Paclitaxel Shortness Of Breath and Other (See Comments)    Other reaction(s): Tight chest (finding) Chest and facial flushing Flushing, chest tightness, SOB w/ Taxol on 05/26/14 at [place].   Flagyl [Metronidazole] Other (See Comments)    headache   Nutmeg Oil (Myristica Oil) Other (See Comments)    Tightness in chest and throat and difficulty swallowing and breathing.    Current Medications: Current Outpatient Medications  Medication Sig Dispense Refill   acetaminophen (TYLENOL) 500 MG tablet Take 2 tablets (1,000 mg total) by mouth every 6 (six) hours. 30 tablet 0   pantoprazole (PROTONIX) 40 MG tablet Take 1 tablet (40 mg total) by mouth daily. 90 tablet 3   amoxicillin-clavulanate (AUGMENTIN) 875-125 MG tablet Take 1 tablet by mouth 2 (two) times daily. (Patient not taking: Reported on 11/18/2020) 14 tablet 0   predniSONE (DELTASONE) 10 MG tablet Take 3 pills once daily by mouth for 3 days, then 2 pills once daily for 3 days, then 1 pill once daily for 3 days and then stop (Patient not taking: Reported on 11/18/2020) 18 tablet 0   No current facility-administered medications for this visit.   Facility-Administered Medications Ordered in Other Visits  Medication Dose Route Frequency Provider Last Rate Last Admin   sodium chloride 0.9 % injection 10 mL  10 mL  Intravenous PRN Choksi, Delorise Shiner, MD   10 mL at 11/19/14 1628   sodium chloride flush (NS) 0.9 % injection 10 mL  10 mL Intravenous PRN Cammie Sickle, MD   10 mL at 10/28/15 1135   sodium chloride flush (NS) 0.9 % injection 10 mL  10 mL Intravenous PRN Cammie Sickle, MD   10 mL at 03/20/17 0831   Review of Systems as per interval history General: no complaints  HEENT: no complaints  Lungs: no complaints  Cardiac: no complaints  GI: no complaints other than chronic symptoms  GU: no complaints  Musculoskeletal: no complaints  Extremities: no complaints  Skin: no complaints  Neuro: no complaints  Endocrine: no complaints  Psych: no complaints      Objective:   Vitals:   11/18/20 1404  BP: 111/82  Pulse: 80  Resp: 20  Temp: 98.7 F (37.1 C)  SpO2: 100%    Body mass index is 25.68 kg/m.  ECOG Performance Status: 0 - Asymptomatic  GENERAL: Patient is a well appearing female in no acute distress HEENT:  PERRL, neck supple with midline trachea. Thyroid without masses.  NODES:  No cervical, supraclavicular, axillary, or inguinal lymphadenopathy palpated.  BDOMEN:  Soft, nontender.  Nondistended. No masses, hernias, or ascites.   EXTREMITIES:  No peripheral edema.   SKIN:  Incisions all well healed.  NEURO:  Nonfocal. Well oriented.  Appropriate affect.  Pelvic assistance with CME: EGBUS: no lesions Cervix: surgically absent Vagina: no lesions, no discharge or bleeding Uterus: surgically absent Adnexa: no palpable masses Rectovaginal: deferred.    Assessment:  Debbie Richardson is a 60 y.o. 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 No evidence of disease on CT or exam and CA125 normal in March 2019. Interval TLH/Omentectomy 7/16 negative for malignancy. Interval imaging significant for diverticular disease. Persistent pain worked up by GI and thought to be consistent with  bacterial overgrowth syndrome and adhesive disease. Prior CT scans and PET all negative for recurrent disease. Port removed. Normal CA-125. GI symptoms stable. NED on exam today.   Axillary pain, uncertain etiology. No obvious adenopathy  Multiple medical complaints and issues improved currently.    Plan:   Problem List Items Addressed This Visit       Other   History of ovarian cancer - Primary   Relevant Orders   CA 125   Other Visit Diagnoses     Axillary pain, left           Ca125 continues to be normal, asymptomatic, and no evidence of disease on exam today.  Axillary pain uncertain etiology. Recommended mammogram for further evaluation. She is due for her mammogram and scheduled to have that soon. I recommended she contact us with the result. If negative than recommend chest CT to assess for adenopathy if her symptoms are persistent.   If all normal than follow up in 1 year and rtc for surveillance visit with CA125.   A total of 20 minutes were spent with the patient/family today; >50% was spent in education, counseling and coordination of care for stage II at least high grade serous ovarian cancer and abdominal pain.  Julias Mould Gaetana Michaelis, MD

## 2020-11-19 ENCOUNTER — Other Ambulatory Visit: Payer: Self-pay

## 2020-11-19 ENCOUNTER — Ambulatory Visit
Admission: RE | Admit: 2020-11-19 | Discharge: 2020-11-19 | Disposition: A | Discharge status: Home / Self Care | Payer: BC Managed Care – PPO | Source: Ambulatory Visit | Attending: Family Medicine | Admitting: Family Medicine

## 2020-11-19 DIAGNOSIS — Z1231 Encounter for screening mammogram for malignant neoplasm of breast: Secondary | ICD-10-CM | POA: Insufficient documentation

## 2020-12-09 ENCOUNTER — Telehealth: Payer: Self-pay | Admitting: *Deleted

## 2020-12-09 DIAGNOSIS — M1711 Unilateral primary osteoarthritis, right knee: Secondary | ICD-10-CM | POA: Diagnosis not present

## 2020-12-09 NOTE — Telephone Encounter (Signed)
PA done at [email or web address] for pt's Protonix. I will await a response

## 2020-12-30 ENCOUNTER — Ambulatory Visit: Payer: BC Managed Care – PPO | Admitting: Family Medicine

## 2020-12-30 ENCOUNTER — Encounter: Payer: Self-pay | Admitting: Family Medicine

## 2020-12-30 ENCOUNTER — Ambulatory Visit (INDEPENDENT_AMBULATORY_CARE_PROVIDER_SITE_OTHER)
Admission: RE | Admit: 2020-12-30 | Discharge: 2020-12-30 | Disposition: A | Discharge status: Home / Self Care | Payer: BC Managed Care – PPO | Source: Ambulatory Visit | Attending: Family Medicine | Admitting: Family Medicine

## 2020-12-30 ENCOUNTER — Other Ambulatory Visit: Payer: Self-pay

## 2020-12-30 VITALS — BP 112/76 | HR 82 | Temp 97.9°F | Ht 62.0 in | Wt 139.0 lb

## 2020-12-30 DIAGNOSIS — M79622 Pain in left upper arm: Secondary | ICD-10-CM | POA: Diagnosis not present

## 2020-12-30 DIAGNOSIS — K219 Gastro-esophageal reflux disease without esophagitis: Secondary | ICD-10-CM | POA: Diagnosis not present

## 2020-12-30 DIAGNOSIS — R140 Abdominal distension (gaseous): Secondary | ICD-10-CM

## 2020-12-30 DIAGNOSIS — R0601 Orthopnea: Secondary | ICD-10-CM

## 2020-12-30 DIAGNOSIS — N644 Mastodynia: Secondary | ICD-10-CM | POA: Diagnosis not present

## 2020-12-30 DIAGNOSIS — R059 Cough, unspecified: Secondary | ICD-10-CM | POA: Diagnosis not present

## 2020-12-30 LAB — CBC WITH DIFFERENTIAL/PLATELET
Basophils Absolute: 0 10*3/uL (ref 0.0–0.1)
Basophils Relative: 0.8 % (ref 0.0–3.0)
Eosinophils Absolute: 0 10*3/uL (ref 0.0–0.7)
Eosinophils Relative: 0.8 % (ref 0.0–5.0)
HCT: 39 % (ref 36.0–46.0)
Hemoglobin: 13.4 g/dL (ref 12.0–15.0)
Lymphocytes Relative: 32.6 % (ref 12.0–46.0)
Lymphs Abs: 1.2 10*3/uL (ref 0.7–4.0)
MCHC: 34.3 g/dL (ref 30.0–36.0)
MCV: 93.8 fl (ref 78.0–100.0)
Monocytes Absolute: 0.2 10*3/uL (ref 0.1–1.0)
Monocytes Relative: 5.8 % (ref 3.0–12.0)
Neutro Abs: 2.2 10*3/uL (ref 1.4–7.7)
Neutrophils Relative %: 60 % (ref 43.0–77.0)
Platelets: 209 10*3/uL (ref 150.0–400.0)
RBC: 4.16 Mil/uL (ref 3.87–5.11)
RDW: 12.9 % (ref 11.5–15.5)
WBC: 3.6 10*3/uL — ABNORMAL LOW (ref 4.0–10.5)

## 2020-12-30 LAB — COMPREHENSIVE METABOLIC PANEL
ALT: 18 U/L (ref 0–35)
AST: 20 U/L (ref 0–37)
Albumin: 4.8 g/dL (ref 3.5–5.2)
Alkaline Phosphatase: 41 U/L (ref 39–117)
BUN: 14 mg/dL (ref 6–23)
CO2: 27 mEq/L (ref 19–32)
Calcium: 9.4 mg/dL (ref 8.4–10.5)
Chloride: 102 mEq/L (ref 96–112)
Creatinine, Ser: 0.65 mg/dL (ref 0.40–1.20)
GFR: 95.45 mL/min (ref >60.00)
Glucose, Bld: 90 mg/dL (ref 70–99)
Potassium: 4.2 mEq/L (ref 3.5–5.1)
Sodium: 138 mEq/L (ref 135–145)
Total Bilirubin: 0.8 mg/dL (ref 0.2–1.2)
Total Protein: 7.3 g/dL (ref 6.0–8.3)

## 2020-12-30 LAB — BRAIN NATRIURETIC PEPTIDE: Pro B Natriuretic peptide (BNP): 12 pg/mL (ref 0.0–100.0)

## 2020-12-30 NOTE — Assessment & Plan Note (Signed)
Night time Worse when lying flat Has GERd but heartburn improved Some throat clearing   Lab today  cxr today

## 2020-12-30 NOTE — Assessment & Plan Note (Signed)
Pt does not tolerate lying flat No PND and no sob on exertion  No ankle edema   BNP ordered cxr ordered   May be linked to acid reflux

## 2020-12-30 NOTE — Assessment & Plan Note (Signed)
Ongoing with GERD Cbc ordered May have to f/u with GI

## 2020-12-30 NOTE — Patient Instructions (Signed)
Lab today   Chest xray today   Call and set up your diagnostic mammogram and ultrasound   If symptoms worsen or change in the meantime let us know

## 2020-12-30 NOTE — Progress Notes (Signed)
Subjective:    Patient ID: Debbie Richardson, female    DOB: 1960-08-13, 60 y.o.   MRN: [id number]  This visit occurred during the SARS-CoV-2 public health emergency.  Safety protocols were in place, including screening questions prior to the visit, additional usage of staff PPE, and extensive cleaning of exam room while observing appropriate contact time as indicated for disinfecting solutions.   HPI Pt presents for c/o GERD and arm tenderness   Wt Readings from Last 3 Encounters:  12/30/20 139 lb (63 kg)  11/18/20 140 lb 6.4 oz (63.7 kg)  10/21/20 138 lb (62.6 kg)   25.42 kg/m  Last visit in may pt noted more heartburn Got back on protonix 40 mg daily  Watching diet  Doing better in terms of heartburn   Still having some tenderness under her L arm  Tender in axilla down into her arm  Cannot feel any lumps Thinks she gained weight   Annoying  No pain unless she touches it  Dull in nature No tingling or numbness No burning  Feels funny in upper arm but not numb No change in grip or strength  Change in position helps (like propping it up while sleeping)   May be worse if holding something   No trauma    A little more advil recently (cautiously) That helps a little  Mammogram 8/22-nl    Has taken tagamet in the past EGD in 2016 showed some polyps  A little cough/wakes up with at night  Going on since mid summer Thought it was allergies   She does have to clear throat   Sometimes productive Clear to pale in color /milky    Sleeps propped up /does not like lying flat (feels like she is struggling to breathe)  No heart problems   Pulse ox 97% today ra  No sob on exertion  Some walking in her neighborhood  No ankle swelling   Stress level is up a bit Paul Dykes /anxiety -adding to?   Patient Active Problem List   Diagnosis Date Noted   Axillary pain, left 12/30/2020   Orthopnea 12/30/2020   Breast tenderness 12/30/2020   Cough 12/30/2020   Chest  wall pain 08/12/2020   Lipid screening 06/21/2019   History of anaphylaxis 06/21/2019   Screening mammogram, encounter for 01/07/2019   History of ovarian cancer 10/03/2018   Bloating 08/02/2017   Menopause 09/14/2016   Neoplasm, ovary, malignant, unspecified laterality ([place]) 12/09/2015   Acute sinusitis 08/11/2015   Left flank pain 06/22/2015   Lower abdominal pain 11/26/2014   Status post laparoscopic hysterectomy 10/29/2014   Allergic conjunctivitis and rhinitis 07/04/2012   ETD (eustachian tube dysfunction) 07/02/2007   GERD (gastroesophageal reflux disease) 05/01/2007   PSORIASIS NEC 12/07/2006   Vertigo 08/25/2006   Past Medical History:  Diagnosis Date   Anxiety    Arthritis    Benign fundic gland polyps of stomach    Dizziness    vertigo   Gallstones    GERD (gastroesophageal reflux disease)    Ovarian cancer ([place]) 2016   Personal history of chemotherapy    Past Surgical History:  Procedure Laterality Date   CESAREAN SECTION     CHOLECYSTECTOMY     COLONOSCOPY     LAPAROSCOPIC HYSTERECTOMY N/A 10/29/2014   Procedure: HYSTERECTOMY TOTAL LAPAROSCOPIC, OMENTUMECTOMY;  Surgeon: Gillis Ends, MD;  Location: [facility];  Service: Gynecology;  Laterality: N/A;   LAPAROSCOPIC OOPHERECTOMY     OOPHORECTOMY     PORTA  CATH REMOVAL N/A 04/26/2018   Procedure: PORTA CATH REMOVAL;  Surgeon: Algernon Huxley, MD;  Location: [facility];  Service: Cardiovascular;  Laterality: N/A;   UPPER GI ENDOSCOPY     Social History   Tobacco Use   Smoking status: Former    Types: Cigarettes    Quit date: 04/05/1991    Years since quitting: 29.7   Smokeless tobacco: Never  Vaping Use   Vaping Use: Never used  Substance Use Topics   Alcohol use: Yes    Alcohol/week: 0.0 standard drinks    Comment: 1 drink of wine daily   Drug use: No   Family History  Problem Relation Age of Onset   Hypertension Mother        59yo    Hyperlipidemia Mother    GER disease Mother     Heart disease Father        79yo   Breast cancer Sister 70   Heart disease Paternal Grandmother    Breast cancer Paternal Grandmother 31       deceased 20   Kidney disease Neg Hx    Liver disease Neg Hx    Colon cancer Neg Hx    Colon polyps Neg Hx    Esophageal cancer Neg Hx    Pancreatic cancer Neg Hx    Allergies  Allergen Reactions   Ciprofloxacin Anaphylaxis   Paclitaxel Shortness Of Breath and Other (See Comments)    Other reaction(s): Tight chest (finding) Chest and facial flushing Flushing, chest tightness, SOB w/ Taxol on 05/26/14 at [facility].   Flagyl [Metronidazole] Other (See Comments)    headache   Nutmeg Oil (Myristica Oil) Other (See Comments)    Tightness in chest and throat and difficulty swallowing and breathing.   Current Outpatient Medications on File Prior to Visit  Medication Sig Dispense Refill   acetaminophen (TYLENOL) 500 MG tablet Take 2 tablets (1,000 mg total) by mouth every 6 (six) hours. 30 tablet 0   pantoprazole (PROTONIX) 40 MG tablet Take 1 tablet (40 mg total) by mouth daily. 90 tablet 3   Current Facility-Administered Medications on File Prior to Visit  Medication Dose Route Frequency Provider Last Rate Last Admin   sodium chloride 0.9 % injection 10 mL  10 mL Intravenous PRN Choksi, Delorise Shiner, MD   10 mL at 11/19/14 1628   sodium chloride flush (NS) 0.9 % injection 10 mL  10 mL Intravenous PRN Charlaine Dalton R, MD   10 mL at 10/28/15 1135   sodium chloride flush (NS) 0.9 % injection 10 mL  10 mL Intravenous PRN Cammie Sickle, MD   10 mL at 03/20/17 0831    Review of Systems  Constitutional:  Negative for activity change, appetite change, fatigue, fever and unexpected weight change.  HENT:  Negative for congestion, ear pain, rhinorrhea, sinus pressure and sore throat.   Eyes:  Negative for pain, redness and visual disturbance.  Respiratory:  Negative for cough, shortness of breath and wheezing.   Cardiovascular:  Negative for  chest pain, palpitations and leg swelling.       L breast pain   Gastrointestinal:  Negative for abdominal pain, blood in stool, constipation, diarrhea, nausea and vomiting.       GERD with belching  Endocrine: Negative for polydipsia and polyuria.  Genitourinary:  Negative for dysuria, frequency and urgency.       L breast and axillary pain   Musculoskeletal:  Negative for arthralgias, back pain and  myalgias.  Skin:  Negative for pallor and rash.  Allergic/Immunologic: Negative for environmental allergies.  Neurological:  Negative for dizziness, syncope and headaches.  Hematological:  Negative for adenopathy. Does not bruise/bleed easily.  Psychiatric/Behavioral:  Negative for decreased concentration and dysphoric mood. The patient is not nervous/anxious.       Objective:   Physical Exam Constitutional:      General: She is not in acute distress.    Appearance: Normal appearance. She is well-developed and normal weight.  HENT:     Head: Normocephalic and atraumatic.     Right Ear: Tympanic membrane, ear canal and external ear normal.     Left Ear: Tympanic membrane, ear canal and external ear normal.     Mouth/Throat:     Mouth: Mucous membranes are moist.     Pharynx: Oropharynx is clear. No oropharyngeal exudate or posterior oropharyngeal erythema.  Eyes:     General: No scleral icterus.    Conjunctiva/sclera: Conjunctivae normal.     Pupils: Pupils are equal, round, and reactive to light.  Neck:     Thyroid: No thyromegaly.     Vascular: No carotid bruit or JVD.  Cardiovascular:     Rate and Rhythm: Normal rate and regular rhythm.     Heart sounds: Normal heart sounds.    No gallop.     Comments: No JVD Pulmonary:     Effort: Pulmonary effort is normal. No respiratory distress.     Breath sounds: Normal breath sounds. No stridor. No wheezing, rhonchi or rales.  Abdominal:     General: Abdomen is flat. Bowel sounds are normal. There is no distension or abdominal bruit.      Palpations: Abdomen is soft. There is no mass.     Tenderness: There is abdominal tenderness. There is no guarding.     Hernia: No hernia is present.     Comments: Very mild epigastric tenderness  Musculoskeletal:     Cervical back: Normal range of motion and neck supple. No rigidity or tenderness.     Right lower leg: No edema.     Left lower leg: No edema.  Lymphadenopathy:     Cervical: No cervical adenopathy.  Skin:    General: Skin is warm and dry.     Coloration: Skin is not jaundiced or pale.     Findings: No bruising, erythema or rash.  Neurological:     Mental Status: She is alert.     Coordination: Coordination normal.     Deep Tendon Reflexes: Reflexes are normal and symmetric. Reflexes normal.  Psychiatric:        Mood and Affect: Mood normal.        Cognition and Memory: Cognition and memory normal.     Comments: Seems stressed           Assessment & Plan:   Problem List Items Addressed This Visit       Digestive   GERD (gastroesophageal reflux disease)    Continues protonix Heartburn is better Unsure if adding to L axillary symptoms? Also if causing night time cough and orthopnea May consider f/u with GI      Relevant Orders   CBC with Differential/Platelet   Comprehensive metabolic panel     Other   Bloating    Ongoing with GERD Cbc ordered May have to f/u with GI      Relevant Orders   Brain natriuretic peptide   CBC with Differential/Platelet   Comprehensive metabolic panel  Axillary pain, left - Primary    With lateral breast soreness  Nl msk and neuro exams diag mm and [place] of L side ordered      Relevant Orders   MM DIAG BREAST TOMO UNI LEFT   US BREAST LTD UNI LEFT INC AXILLA   Orthopnea    Pt does not tolerate lying flat No PND and no sob on exertion  No ankle edema   BNP ordered cxr ordered   May be linked to acid reflux      Relevant Orders   Brain natriuretic peptide   Breast tenderness    Former cw pain has  moved to L axilla and breast Some lateral breast tendeness  Dg mm and [place] of L breast and axilla ordered   Reassuring msk exam /neuro exam       Relevant Orders   MM DIAG BREAST TOMO UNI LEFT   US BREAST LTD UNI LEFT INC AXILLA   Cough    Night time Worse when lying flat Has GERd but heartburn improved Some throat clearing   Lab today  cxr today      Relevant Orders   DG Chest 2 View

## 2020-12-30 NOTE — Assessment & Plan Note (Signed)
With lateral breast soreness  Nl msk and neuro exams diag mm and [place] of L side ordered

## 2020-12-30 NOTE — Assessment & Plan Note (Signed)
Continues protonix Heartburn is better Unsure if adding to L axillary symptoms? Also if causing night time cough and orthopnea May consider f/u with GI

## 2020-12-30 NOTE — Assessment & Plan Note (Addendum)
Former cw pain has moved to L axilla and breast Some lateral breast tendeness  Dg mm and [place] of L breast and axilla ordered   Reassuring msk exam /neuro exam

## 2021-01-01 ENCOUNTER — Encounter: Payer: Self-pay | Admitting: *Deleted

## 2021-02-04 ENCOUNTER — Telehealth: Payer: Self-pay | Admitting: Gastroenterology

## 2021-02-04 NOTE — Telephone Encounter (Signed)
Pt. Calling she says she is having some digestive issues like before and would like to get a call back to see if she can get some samples to clear it up before her appt in Jan.

## 2021-02-05 ENCOUNTER — Telehealth: Payer: Self-pay | Admitting: Gastroenterology

## 2021-02-05 NOTE — Telephone Encounter (Signed)
Called Debbie Richardson back and she stated that she has had abdominal bloating and pain and would like to see Dr. Vicente Males sooner than January. Therefore, I scheduled her an appointment for 02/08/2021 to be seen. Debbie Richardson agreed.

## 2021-02-05 NOTE — Telephone Encounter (Signed)
Called patient back and had to leave her a detailed message letting her know that Dr. Vicente Males is not able to give her any samples since he had not seen her since 2021. Patient was advised to call me back so I could put her in the schedule sooner than January.

## 2021-02-05 NOTE — Telephone Encounter (Signed)
Pt. Returning call she said she will keep her phone available when you call back

## 2021-02-08 ENCOUNTER — Encounter: Payer: Self-pay | Admitting: Gastroenterology

## 2021-02-08 ENCOUNTER — Ambulatory Visit: Payer: BC Managed Care – PPO | Admitting: Gastroenterology

## 2021-02-08 ENCOUNTER — Other Ambulatory Visit: Payer: Self-pay

## 2021-02-08 VITALS — BP 116/74 | HR 87 | Temp 98.1°F | Ht 62.0 in | Wt 141.0 lb

## 2021-02-08 DIAGNOSIS — K580 Irritable bowel syndrome with diarrhea: Secondary | ICD-10-CM

## 2021-02-08 MED ORDER — RIFAXIMIN 550 MG PO TABS: 550.0000 mg | ORAL_TABLET | Freq: Two times a day (BID) | ORAL | 0 refills | Status: DC

## 2021-02-08 NOTE — Progress Notes (Signed)
[name] MD, MRCP(U.K) [address]  [place]  [place],  [address]  Main: [phone]  Fax: [phone]   Primary Care Physician: Tower, [name], MD  Primary Gastroenterologist:  Dr. [name]    Chief complaint: IBS diarrhea follow-up   HPI: Debbie Richardson is a 60 y.o. female Summary of history :   I have seen her for the past few years for IBS diarrhea.  Successfully performed rectal courses of antibiotics with past response to Xifaxan.   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 Undergone BSO in 2016  .  02/27/17- Ct abdomen - diverticulosis of the colon , low stool burden.    Colonoscopy in 04/2014 showed diverticulosis and internal hemorroids. EGD 04/2014 showed - small gastric polyps(fundic gland polyps) otherwise normal.I treated her empirically with doxycycline for 2 weeks , I felt that the adhesions from surgery were likely contributing to stasis and small bowel bacterial overgrowth. Completed 2 rounds of doxycycline.Recurrence of bloating symptoms in October 2019 given a course of Augmentin which helped partially but did not resolve the issue.  In December 2019 treated with Xifaxan provided samples.  Another round of treatment in June 2020 with Xifaxan.      Interval history 11/18/2019-02/08/2021 She states that the symptoms have recurred chest that they have in the past.  Bloating, abdominal discomfort.  Loose stools when she is certain types of foods.  No new symptoms.  Denies any constipation.  Current Outpatient Medications  Medication Sig Dispense Refill   pantoprazole (PROTONIX) 40 MG tablet Take 1 tablet (40 mg total) by mouth daily. 90 tablet 3   acetaminophen (TYLENOL) 500 MG tablet Take 2 tablets (1,000 mg total) by mouth every 6 (six) hours. (Patient not taking: Reported on 02/08/2021) 30 tablet 0   No current facility-administered medications for this visit.    Facility-Administered Medications Ordered in Other Visits  Medication Dose Route Frequency Provider Last Rate Last Admin   sodium chloride 0.9 % injection 10 mL  10 mL Intravenous PRN Choksi, Janak, MD   10 mL at 11/19/14 1628   sodium chloride flush (NS) 0.9 % injection 10 mL  10 mL Intravenous PRN Charlaine Dalton R, MD   10 mL at 10/28/15 1135   sodium chloride flush (NS) 0.9 % injection 10 mL  10 mL Intravenous PRN Cammie Sickle, MD   10 mL at 03/20/17 0831    Allergies as of 02/08/2021 - Review Complete 02/08/2021  Allergen Reaction Noted   Ciprofloxacin Anaphylaxis 08/19/2013   Paclitaxel Shortness Of Breath and Other (See Comments) 06/27/2014   Flagyl [metronidazole] Other (See Comments) 06/22/2015   Nutmeg oil (myristica oil) Other (See Comments) 02/27/2019    ROS:  General: Negative for anorexia, weight loss, fever, chills, fatigue, weakness. ENT: Negative for hoarseness, difficulty swallowing , nasal congestion. CV: Negative for chest pain, angina, palpitations, dyspnea on exertion, peripheral edema.  Respiratory: Negative for dyspnea at rest, dyspnea on exertion, cough, sputum, wheezing.  GI: See history of present illness. GU:  Negative for dysuria, hematuria, urinary incontinence, urinary frequency, nocturnal urination.  Endo: Negative for unusual weight change.    Physical Examination:   BP 116/74   Pulse 87   Temp 98.1 F (36.7 C) (Oral)   Ht 5\' 2"  (1.575 m)   Wt 141 lb (64 kg)   LMP 03/30/2013   BMI 25.79 kg/m   General: Well-nourished, well-developed in no acute distress.  Eyes: No icterus. Conjunctivae pink. Abdomen: Bowel sounds are normal, nontender, nondistended, no hepatosplenomegaly or masses, no abdominal bruits or hernia , no rebound or guarding.   Extremities: No lower extremity edema. No clubbing or deformities. Neuro: Alert and oriented x 3.  Grossly intact. Skin: Warm and dry, no jaundice.   Psych: Alert and cooperative, normal  mood and affect.   Imaging Studies: No results found.  Assessment and Plan:   Debbie Richardson is a 60 y.o. y/o femaleis here to see me for IBS diarrhea.  Has been previously been treated with doxycycline, Augmentin, cephalexin each time she responds reasonably well but has recurrence.  Best response has been to Xifaxan so far .    Plan 1.  Treat with Xifaxan for 14 days for IBS-D.  She has been instructed to call me at the end of 14 days of the symptoms have not completely resolved we will give her 14 more days of treatment.  Dr [name]  MD,MRCP [facility]) Follow up in as needed

## 2021-02-09 MED ORDER — RIFAXIMIN 550 MG PO TABS: 550.0000 mg | ORAL_TABLET | Freq: Two times a day (BID) | ORAL | 0 refills | Status: DC

## 2021-02-09 MED ORDER — RIFAXIMIN 550 MG PO TABS: 550.0000 mg | ORAL_TABLET | Freq: Three times a day (TID) | ORAL | 0 refills | Status: AC

## 2021-02-09 NOTE — Addendum Note (Signed)
Addended by: Wayna Chalet on: 02/09/2021 07:12 PM   Modules accepted: Orders

## 2021-02-09 NOTE — Addendum Note (Signed)
Addended by: Wayna Chalet on: 02/09/2021 07:22 PM   Modules accepted: Orders

## 2021-02-12 ENCOUNTER — Other Ambulatory Visit: Payer: Self-pay

## 2021-02-12 ENCOUNTER — Encounter: Payer: Self-pay | Admitting: Family Medicine

## 2021-02-12 ENCOUNTER — Telehealth (INDEPENDENT_AMBULATORY_CARE_PROVIDER_SITE_OTHER): Payer: BC Managed Care – PPO | Admitting: Family Medicine

## 2021-02-12 DIAGNOSIS — J069 Acute upper respiratory infection, unspecified: Secondary | ICD-10-CM | POA: Insufficient documentation

## 2021-02-12 MED ORDER — BENZONATATE 200 MG PO CAPS: 200.0000 mg | ORAL_CAPSULE | Freq: Three times a day (TID) | ORAL | 1 refills | Status: DC | PRN

## 2021-02-12 NOTE — Patient Instructions (Signed)
Drink fluids and rest  Get a rapid covid test and let us know the result   Drink fluids and rest  mucinex DM is good for cough and congestion  Nasal saline for congestion as needed  Also flonase nasal spray Tylenol for fever or pain or headache (or ibuprofen) Please alert [place] if symptoms worsen (if severe or short of breath please go to the ER)

## 2021-02-12 NOTE — Assessment & Plan Note (Signed)
Day 4  Congestion/prod cough with colored phlegm and no sob or fever  covid is in the differential She plans to get a home test and call with result Would consider antiviral if positive  Symptom care discussed ER precautions reviewed   Watching for wheeze or sob

## 2021-02-12 NOTE — Progress Notes (Signed)
Virtual Visit via Video Note  I connected with [name] on 02/12/21 at 12:00 PM EST by a video enabled telemedicine application and verified that I am speaking with the correct person using two identifiers.  Location: Patient: home Provider: office    I discussed the limitations of evaluation and management by telemedicine and the availability of in person appointments. The patient expressed understanding and agreed to proceed.  Parties involved in encounter  Patient: Debbie Richardson  Provider:  Loura Pardon MD   History of Present Illness: Pt presents with c/o cough and nasal congestion   Feels lousy  Tuesday am woke up with heartburn Chest tightness Moved up into her head  Constant cough/worse at night   Blows nose- clear  Now drainage from the back of throat-green  Phlegm from cough -green   No wheeze at all  Chest is sore from cough but less tight  Cough is causing ST  Has not done a test, does not have one   Took xifaxan for GI symptoms incidentally  No fever  No chills or aches   Covid immunized   Otc:  Tylenol as needed  Occ advil - for ST Oct decongestant - sudafed like     Patient Active Problem List   Diagnosis Date Noted   Axillary pain, left 12/30/2020   Orthopnea 12/30/2020   Breast tenderness 12/30/2020   Cough 12/30/2020   Chest wall pain 08/12/2020   Lipid screening 06/21/2019   History of anaphylaxis 06/21/2019   Screening mammogram, encounter for 01/07/2019   History of ovarian cancer 10/03/2018   Bloating 08/02/2017   Menopause 09/14/2016   Neoplasm, ovary, malignant, unspecified laterality ([place]) 12/09/2015   Acute sinusitis 08/11/2015   Left flank pain 06/22/2015   Lower abdominal pain 11/26/2014   Status post laparoscopic hysterectomy 10/29/2014   Allergic conjunctivitis and rhinitis 07/04/2012   ETD (eustachian tube dysfunction) 07/02/2007   GERD (gastroesophageal reflux disease) 05/01/2007   PSORIASIS NEC 12/07/2006    Vertigo 08/25/2006   Past Medical History:  Diagnosis Date   Anxiety    Arthritis    Benign fundic gland polyps of stomach    Dizziness    vertigo   Gallstones    GERD (gastroesophageal reflux disease)    Ovarian cancer ([place]) 2016   Personal history of chemotherapy    Past Surgical History:  Procedure Laterality Date   CESAREAN SECTION     CHOLECYSTECTOMY     COLONOSCOPY     LAPAROSCOPIC HYSTERECTOMY N/A 10/29/2014   Procedure: HYSTERECTOMY TOTAL LAPAROSCOPIC, OMENTUMECTOMY;  Surgeon: Gillis Ends, MD;  Location: [facility];  Service: Gynecology;  Laterality: N/A;   LAPAROSCOPIC OOPHERECTOMY     OOPHORECTOMY     PORTA CATH REMOVAL N/A 04/26/2018   Procedure: PORTA CATH REMOVAL;  Surgeon: Algernon Huxley, MD;  Location: [facility];  Service: Cardiovascular;  Laterality: N/A;   UPPER GI ENDOSCOPY     Social History   Tobacco Use   Smoking status: Former    Types: Cigarettes    Quit date: 04/05/1991    Years since quitting: 29.8   Smokeless tobacco: Never  Vaping Use   Vaping Use: Never used  Substance Use Topics   Alcohol use: Yes    Alcohol/week: 0.0 standard drinks    Comment: 1 drink of wine daily   Drug use: No   Family History  Problem Relation Age of Onset   Hypertension Mother        12yo  Hyperlipidemia Mother    GER disease Mother    Heart disease Father        19yo   Breast cancer Sister 60   Heart disease Paternal Grandmother    Breast cancer Paternal Grandmother 54       deceased 36   Kidney disease Neg Hx    Liver disease Neg Hx    Colon cancer Neg Hx    Colon polyps Neg Hx    Esophageal cancer Neg Hx    Pancreatic cancer Neg Hx    Allergies  Allergen Reactions   Ciprofloxacin Anaphylaxis   Paclitaxel Shortness Of Breath and Other (See Comments)    Other reaction(s): Tight chest (finding) Chest and facial flushing Flushing, chest tightness, SOB w/ Taxol on 05/26/14 at [facility].   Flagyl [Metronidazole] Other (See  Comments)    headache   Nutmeg Oil (Myristica Oil) Other (See Comments)    Tightness in chest and throat and difficulty swallowing and breathing.   Current Outpatient Medications on File Prior to Visit  Medication Sig Dispense Refill   acetaminophen (TYLENOL) 500 MG tablet Take 2 tablets (1,000 mg total) by mouth every 6 (six) hours. 30 tablet 0   ibuprofen (ADVIL) 200 MG tablet Take 400 mg by mouth every 6 (six) hours as needed.     pantoprazole (PROTONIX) 40 MG tablet Take 1 tablet (40 mg total) by mouth daily. 90 tablet 3   rifaximin (XIFAXAN) 550 MG TABS tablet Take 1 tablet (550 mg total) by mouth 3 (three) times daily for 14 days. 42 tablet 0   Current Facility-Administered Medications on File Prior to Visit  Medication Dose Route Frequency Provider Last Rate Last Admin   sodium chloride 0.9 % injection 10 mL  10 mL Intravenous PRN Choksi, Delorise Shiner, MD   10 mL at 11/19/14 1628   sodium chloride flush (NS) 0.9 % injection 10 mL  10 mL Intravenous PRN Charlaine Dalton R, MD   10 mL at 10/28/15 1135   sodium chloride flush (NS) 0.9 % injection 10 mL  10 mL Intravenous PRN Cammie Sickle, MD   10 mL at 03/20/17 0831  Review of Systems  Constitutional:  Negative for chills, fever and malaise/fatigue.  HENT:  Positive for congestion and sore throat. Negative for ear pain and sinus pain.   Eyes:  Negative for blurred vision, discharge and redness.  Respiratory:  Positive for cough and sputum production. Negative for shortness of breath, wheezing and stridor.   Cardiovascular:  Negative for chest pain, palpitations and leg swelling.  Gastrointestinal:  Negative for abdominal pain, diarrhea, nausea and vomiting.  Musculoskeletal:  Negative for myalgias.  Skin:  Negative for rash.  Neurological:  Positive for headaches. Negative for dizziness.   Observations/Objective: Patient appears well, in no distress Weight is baseline  No facial swelling or asymmetry Mildly hoarse voice,  congested No obvious tremor or mobility impairment Moving neck and UEs normally Able to hear the call well  No cough or shortness of breath during interview  Talkative and mentally sharp with no cognitive changes No skin changes on face or neck , no rash or pallor Affect is normal    Assessment and Plan: Problem List Items Addressed This Visit       Respiratory   Viral URI with cough    Day 4  Congestion/prod cough with colored phlegm and no sob or fever  covid is in the differential She plans to get a home test and call  with result Would consider antiviral if positive  Symptom care discussed ER precautions reviewed   Watching for wheeze or sob        Follow Up Instructions: Drink fluids and rest  Get a rapid covid test and let us know the result   Drink fluids and rest  mucinex DM is good for cough and congestion  Nasal saline for congestion as needed  Also flonase nasal spray Tylenol for fever or pain or headache (or ibuprofen) Please alert [place] if symptoms worsen (if severe or short of breath please go to the ER)    I discussed the assessment and treatment plan with the patient. The patient was provided an opportunity to ask questions and all were answered. The patient agreed with the plan and demonstrated an understanding of the instructions.   The patient was advised to call back or seek an in-person evaluation if the symptoms worsen or if the condition fails to improve as anticipated.     Loura Pardon, MD

## 2021-02-15 ENCOUNTER — Telehealth: Payer: Self-pay | Admitting: Family Medicine

## 2021-02-15 MED ORDER — AMOXICILLIN-POT CLAVULANATE 875-125 MG PO TABS: 1.0000 | ORAL_TABLET | Freq: Two times a day (BID) | ORAL | 0 refills | Status: DC

## 2021-02-15 NOTE — Telephone Encounter (Signed)
Have sent my chart message to Dr. Glori Bickers. This message will be closed no further action needed at this time.

## 2021-02-15 NOTE — Telephone Encounter (Signed)
Pt called in stating that she was instructed to call back if she was not feeling well. Pt sent a my chart message too regarding this

## 2021-02-15 NOTE — Telephone Encounter (Signed)
Received phone message as well. Closed out no additional information was included in that call.

## 2021-03-09 ENCOUNTER — Telehealth: Payer: Self-pay | Admitting: Gastroenterology

## 2021-03-09 NOTE — Telephone Encounter (Signed)
Inbound call from pt requesting a call back stating that she has questions regarding her heartburn. Please advise. Thank you.

## 2021-03-09 NOTE — Telephone Encounter (Signed)
Called patient back and she stated that she had been having terrible acid reflux and that it has been burning her esophagus. Patient stated that she had been taking her Pantoprazole and it had not helped at all. Please advise.

## 2021-03-10 NOTE — Telephone Encounter (Signed)
Patient had been taking Pantoprazole 40 MG once a day. Please advise.

## 2021-03-10 NOTE — Telephone Encounter (Signed)
What dose was she taking ?

## 2021-03-11 NOTE — Telephone Encounter (Signed)
Have her increase to BID before meals  RV

## 2021-03-15 NOTE — Telephone Encounter (Signed)
Ginger, can you please try to call this patient for me and let her know what Dr. Verlin Grills recommendation is. For some reason, it sounds busy everytime I try to call from here. Thank you in advance.

## 2021-03-15 NOTE — Telephone Encounter (Signed)
Left vm for pt to return my call regarding recommendation. MyChart message has also been sent to patient.

## 2021-03-28 ENCOUNTER — Emergency Department: Payer: BC Managed Care – PPO

## 2021-03-28 ENCOUNTER — Emergency Department
Admission: EM | Admit: 2021-03-28 | Discharge: 2021-03-28 | Disposition: A | Discharge status: Home / Self Care | Payer: BC Managed Care – PPO | Attending: Emergency Medicine | Admitting: Emergency Medicine

## 2021-03-28 ENCOUNTER — Encounter: Payer: Self-pay | Admitting: Emergency Medicine

## 2021-03-28 ENCOUNTER — Other Ambulatory Visit: Payer: Self-pay

## 2021-03-28 DIAGNOSIS — R1032 Left lower quadrant pain: Secondary | ICD-10-CM

## 2021-03-28 DIAGNOSIS — K529 Noninfective gastroenteritis and colitis, unspecified: Secondary | ICD-10-CM | POA: Diagnosis not present

## 2021-03-28 DIAGNOSIS — Z8543 Personal history of malignant neoplasm of ovary: Secondary | ICD-10-CM | POA: Insufficient documentation

## 2021-03-28 DIAGNOSIS — Z87891 Personal history of nicotine dependence: Secondary | ICD-10-CM | POA: Insufficient documentation

## 2021-03-28 DIAGNOSIS — R109 Unspecified abdominal pain: Secondary | ICD-10-CM | POA: Diagnosis not present

## 2021-03-28 LAB — COMPREHENSIVE METABOLIC PANEL
ALT: 22 U/L (ref 0–44)
AST: 25 U/L (ref 15–41)
Albumin: 4.6 g/dL (ref 3.5–5.0)
Alkaline Phosphatase: 43 U/L (ref 38–126)
Anion gap: 7 (ref 5–15)
BUN: 13 mg/dL (ref 8–23)
CO2: 23 mmol/L (ref 22–32)
Calcium: 8.9 mg/dL (ref 8.9–10.3)
Chloride: 104 mmol/L (ref 98–111)
Creatinine, Ser: 0.73 mg/dL (ref 0.44–1.00)
GFR, Estimated: >60 mL/min (ref >60)
Glucose, Bld: 96 mg/dL (ref 70–99)
Potassium: 3.4 mmol/L — ABNORMAL LOW (ref 3.5–5.1)
Sodium: 134 mmol/L — ABNORMAL LOW (ref 135–145)
Total Bilirubin: 1 mg/dL (ref 0.3–1.2)
Total Protein: 7.5 g/dL (ref 6.5–8.1)

## 2021-03-28 LAB — CBC WITH DIFFERENTIAL/PLATELET
Abs Immature Granulocytes: 0.02 10*3/uL (ref 0.00–0.07)
Basophils Absolute: 0 10*3/uL (ref 0.0–0.1)
Basophils Relative: 1 %
Eosinophils Absolute: 0 10*3/uL (ref 0.0–0.5)
Eosinophils Relative: 0 %
HCT: 38 % (ref 36.0–46.0)
Hemoglobin: 13 g/dL (ref 12.0–15.0)
Immature Granulocytes: 0 %
Lymphocytes Relative: 27 %
Lymphs Abs: 1.4 10*3/uL (ref 0.7–4.0)
MCH: 32 pg (ref 26.0–34.0)
MCHC: 34.2 g/dL (ref 30.0–36.0)
MCV: 93.6 fL (ref 80.0–100.0)
Monocytes Absolute: 0.3 10*3/uL (ref 0.1–1.0)
Monocytes Relative: 6 %
Neutro Abs: 3.3 10*3/uL (ref 1.7–7.7)
Neutrophils Relative %: 66 %
Platelets: 235 10*3/uL (ref 150–400)
RBC: 4.06 MIL/uL (ref 3.87–5.11)
RDW: 12.9 % (ref 11.5–15.5)
WBC: 5 10*3/uL (ref 4.0–10.5)
nRBC: 0 % (ref 0.0–0.2)

## 2021-03-28 LAB — LIPASE, BLOOD: Lipase: 44 U/L (ref 11–51)

## 2021-03-28 MED ORDER — ONDANSETRON 4 MG PO TBDP: 4.0000 mg | ORAL_TABLET | Freq: Three times a day (TID) | ORAL | 0 refills | Status: DC | PRN

## 2021-03-28 MED ORDER — IOHEXOL 300 MG/ML  SOLN
100.0000 mL | Freq: Once | INTRAMUSCULAR | Status: AC | PRN
  Administered 2021-03-28: 17:00:00 100 mL via INTRAVENOUS

## 2021-03-28 MED ORDER — KETOROLAC TROMETHAMINE 30 MG/ML IJ SOLN
15.0000 mg | Freq: Once | INTRAMUSCULAR | Status: AC
  Administered 2021-03-28: 19:00:00 15 mg via INTRAVENOUS
  Filled 2021-03-28: qty 1

## 2021-03-28 MED ORDER — MORPHINE SULFATE (PF) 4 MG/ML IV SOLN
4.0000 mg | Freq: Once | INTRAVENOUS | Status: AC
  Administered 2021-03-28: 16:00:00 4 mg via INTRAVENOUS
  Filled 2021-03-28: qty 1

## 2021-03-28 MED ORDER — DICYCLOMINE HCL 10 MG PO CAPS: 10.0000 mg | ORAL_CAPSULE | Freq: Three times a day (TID) | ORAL | 0 refills | Status: DC

## 2021-03-28 MED ORDER — ONDANSETRON HCL 4 MG/2ML IJ SOLN
4.0000 mg | Freq: Once | INTRAMUSCULAR | Status: AC
  Administered 2021-03-28: 16:00:00 4 mg via INTRAVENOUS
  Filled 2021-03-28: qty 2

## 2021-03-28 MED ORDER — LACTATED RINGERS IV BOLUS
1000.0000 mL | Freq: Once | INTRAVENOUS | Status: AC
  Administered 2021-03-28: 16:00:00 1000 mL via INTRAVENOUS

## 2021-03-28 NOTE — ED Notes (Signed)
Pt is c/o abdominal cramping. MD made aware.

## 2021-03-28 NOTE — ED Notes (Signed)
Lavender, light green, red tubes sent to lab.

## 2021-03-28 NOTE — ED Triage Notes (Signed)
Pt via POV from home. Pt c/o LLQ abd pain states that started yesterday. Pt endorses nausea and diarrhea. Denies fever. Pt is A&Ox4 and NAD.  Pt has a hx of IBS

## 2021-03-28 NOTE — ED Provider Notes (Signed)
[facility] Emergency Department Provider Note   ____________________________________________   Event Date/Time   First MD Initiated Contact with Patient 03/28/21 1537     (approximate)  I have reviewed the triage vital signs and the nursing notes.   HISTORY  Chief Complaint Abdominal Pain    HPI Debbie Richardson is a 60 y.o. female with past medical history of IBS, GERD, and ovarian cancer who presents to the ED complaining of abdominal pain.  Patient reports that she has had about one month of intermittent crampy pain in her abdomen diffusely associated with nausea and belching.  She attributed this to her IBS and was started on Xifaxan by her GI provider.  She reports partial relief of her symptoms at that time, but has been dealing with more sharp and severe pain in the left lower quadrant of her abdomen for the past 2 days.  She describes this pain as intermittent and not exacerbated or alleviated by anything particular.  She has felt nauseous but has not vomited, does endorse diarrhea with some dark stool, but has not noticed any blood.  She has not had any fevers and denies any dysuria, hematuria, or flank pain.  She has not had any cough, chest pain, shortness of breath, denies any sick contacts.        Past Medical History:  Diagnosis Date   Anxiety    Arthritis    Benign fundic gland polyps of stomach    Dizziness    vertigo   Gallstones    GERD (gastroesophageal reflux disease)    Ovarian cancer ([place]) 2016   Personal history of chemotherapy     Patient Active Problem List   Diagnosis Date Noted   Viral URI with cough 02/12/2021   Axillary pain, left 12/30/2020   Orthopnea 12/30/2020   Breast tenderness 12/30/2020   Cough 12/30/2020   Chest wall pain 08/12/2020   Lipid screening 06/21/2019   History of anaphylaxis 06/21/2019   Screening mammogram, encounter for 01/07/2019   History of ovarian cancer 10/03/2018   Bloating  08/02/2017   Menopause 09/14/2016   Neoplasm, ovary, malignant, unspecified laterality ([place]) 12/09/2015   Acute sinusitis 08/11/2015   Left flank pain 06/22/2015   Lower abdominal pain 11/26/2014   Status post laparoscopic hysterectomy 10/29/2014   Allergic conjunctivitis and rhinitis 07/04/2012   ETD (eustachian tube dysfunction) 07/02/2007   GERD (gastroesophageal reflux disease) 05/01/2007   PSORIASIS NEC 12/07/2006   Vertigo 08/25/2006    Past Surgical History:  Procedure Laterality Date   CESAREAN SECTION     CHOLECYSTECTOMY     COLONOSCOPY     LAPAROSCOPIC HYSTERECTOMY N/A 10/29/2014   Procedure: HYSTERECTOMY TOTAL LAPAROSCOPIC, OMENTUMECTOMY;  Surgeon: Gillis Ends, MD;  Location: [facility] OR[facility];  Service: Gynecology;  Laterality: N/A;   LAPAROSCOPIC OOPHERECTOMY     OOPHORECTOMY     PORTA CATH REMOVAL N/A 04/26/2018   Procedure: PORTA CATH REMOVAL;  Surgeon: Algernon Huxley, MD;  Location: [facility];  Service: Cardiovascular;  Laterality: N/A;   UPPER GI ENDOSCOPY      Prior to Admission medications   Medication Sig Start Date End Date Taking? Authorizing Provider  dicyclomine (BENTYL) 10 MG capsule Take 1 capsule (10 mg total) by mouth 4 (four) times daily -  before meals and at bedtime for 10 days. 03/28/21 04/07/21 Yes Blake Divine, MD  ondansetron (ZOFRAN-ODT) 4 MG disintegrating tablet Take 1 tablet (4 mg total) by mouth every 8 (eight) hours  as needed for nausea or vomiting. 03/28/21  Yes Blake Divine, MD  acetaminophen (TYLENOL) 500 MG tablet Take 2 tablets (1,000 mg total) by mouth every 6 (six) hours. 10/30/14   Will Bonnet, MD  amoxicillin-clavulanate (AUGMENTIN) 875-125 MG tablet Take 1 tablet by mouth 2 (two) times daily. 02/15/21   Tower, [name], MD  benzonatate (TESSALON) 200 MG capsule Take 1 capsule (200 mg total) by mouth 3 (three) times daily as needed for cough. Do not bite pill 02/12/21   Tower, [name], MD  ibuprofen (ADVIL) 200 MG  tablet Take 400 mg by mouth every 6 (six) hours as needed.    [provider]  pantoprazole (PROTONIX) 40 MG tablet Take 1 tablet (40 mg total) by mouth daily. 03/24/20   Tower, [name], MD    Allergies Ciprofloxacin, Paclitaxel, Flagyl [metronidazole], and Nutmeg oil (myristica oil)  Family History  Problem Relation Age of Onset   Hypertension Mother        80yo    Hyperlipidemia Mother    GER disease Mother    Heart disease Father        16yo   Breast cancer Sister 54   Heart disease Paternal Grandmother    Breast cancer Paternal Grandmother 68       deceased 74   Kidney disease Neg Hx    Liver disease Neg Hx    Colon cancer Neg Hx    Colon polyps Neg Hx    Esophageal cancer Neg Hx    Pancreatic cancer Neg Hx     Social History Social History   Tobacco Use   Smoking status: Former    Types: Cigarettes    Quit date: 04/05/1991    Years since quitting: 30.0   Smokeless tobacco: Never  Vaping Use   Vaping Use: Never used  Substance Use Topics   Alcohol use: Yes    Alcohol/week: 0.0 standard drinks    Comment: 1 drink of wine daily   Drug use: No    Review of Systems  Constitutional: No fever/chills Eyes: No visual changes. ENT: No sore throat. Cardiovascular: Denies chest pain. Respiratory: Denies shortness of breath. Gastrointestinal: Positive for abdominal pain and nausea, no vomiting.  Positive for diarrhea.  No constipation. Genitourinary: Negative for dysuria. Musculoskeletal: Negative for back pain. Skin: Negative for rash. Neurological: Negative for headaches, focal weakness or numbness.  ____________________________________________   PHYSICAL EXAM:  VITAL SIGNS: ED Triage Vitals  Enc Vitals Group     BP 03/28/21 1442 (!) 154/93     Pulse Rate 03/28/21 1442 89     Resp 03/28/21 1442 20     Temp 03/28/21 1442 98 F (36.7 C)     Temp src --      SpO2 03/28/21 1442 97 %     Weight 03/28/21 1440 137 lb (62.1 kg)     Height 03/28/21  1440 5\' 2"  (1.575 m)     Head Circumference --      Peak Flow --      Pain Score 03/28/21 1440 6     Pain Loc --      Pain Edu? --      Excl. in [place]? --     Constitutional: Alert and oriented. Eyes: Conjunctivae are normal. Head: Atraumatic. Nose: No congestion/rhinnorhea. Mouth/Throat: Mucous membranes are moist. Neck: Normal ROM Cardiovascular: Normal rate, regular rhythm. Grossly normal heart sounds.  2+ radial pulses bilaterally. Respiratory: Normal respiratory effort.  No retractions. Lungs CTAB. Gastrointestinal:  Soft and tender to palpation in the left lower quadrant with no rebound or guarding. No distention. Genitourinary: deferred Musculoskeletal: No lower extremity tenderness nor edema. Neurologic:  Normal speech and language. No gross focal neurologic deficits are appreciated. Skin:  Skin is warm, dry and intact. No rash noted. Psychiatric: Mood and affect are normal. Speech and behavior are normal.  ____________________________________________   LABS (all labs ordered are listed, but only abnormal results are displayed)  Labs Reviewed  COMPREHENSIVE METABOLIC PANEL - Abnormal; Notable for the following components:      Result Value   Sodium 134 (*)    Potassium 3.4 (*)    All other components within normal limits  CBC WITH DIFFERENTIAL/PLATELET  LIPASE, BLOOD  URINALYSIS, ROUTINE W REFLEX MICROSCOPIC     PROCEDURES  Procedure(s) performed (including Critical Care):  Procedures   ____________________________________________   INITIAL IMPRESSION / ASSESSMENT AND PLAN / ED COURSE      60 year old female with past medical history of IBS, GERD, and ovarian cancer presents to the ED with crampy pain in her abdomen diffusely for the past month now with 2 days of more sharp and severe pain in the left lower quadrant of her abdomen.  Differential includes exacerbation of known IBS, diverticulitis, UTI, kidney stone, and gastroenteritis.  We will further  assess with CT scan for complicated diverticulitis or other finding, check labs including LFTs and lipase.  We will hydrate with IV fluids, treat symptomatically with IV morphine and Zofran.  Labs are unremarkable, LFTs and lipase within normal limits.  Patient unable to provide urine sample but very low suspicion for UTI given lack of urinary symptoms.  CT scan is remarkable for colitis with no other significant findings.  Patient continued to have some abdominal pain on reassessment, however this resolved with dose of IV Toradol and patient tolerating p.o. without difficulty.  She is appropriate for discharge home and we will treat symptomatically with Bentyl and Zofran.  She was counseled to follow-up with her GI provider and to return to the ED for new worsening symptoms, patient agrees with plan.      ____________________________________________   FINAL CLINICAL IMPRESSION(S) / ED DIAGNOSES  Final diagnoses:  Colitis  Left lower quadrant abdominal pain     ED Discharge Orders          Ordered    dicyclomine (BENTYL) 10 MG capsule  3 times daily before meals & bedtime        03/28/21 1908    ondansetron (ZOFRAN-ODT) 4 MG disintegrating tablet  Every 8 hours PRN        03/28/21 1908             Note:  This document was prepared using Dragon voice recognition software and may include unintentional dictation errors.    Blake Divine, MD 03/28/21 [phone]

## 2021-04-01 DIAGNOSIS — K529 Noninfective gastroenteritis and colitis, unspecified: Secondary | ICD-10-CM | POA: Diagnosis not present

## 2021-04-08 ENCOUNTER — Other Ambulatory Visit: Payer: Self-pay

## 2021-04-08 ENCOUNTER — Encounter: Payer: Self-pay | Admitting: Gastroenterology

## 2021-04-08 ENCOUNTER — Ambulatory Visit (INDEPENDENT_AMBULATORY_CARE_PROVIDER_SITE_OTHER): Payer: BC Managed Care – PPO | Admitting: Gastroenterology

## 2021-04-08 VITALS — BP 111/58 | HR 92 | Temp 98.0°F | Ht 60.0 in | Wt 135.6 lb

## 2021-04-08 DIAGNOSIS — K529 Noninfective gastroenteritis and colitis, unspecified: Secondary | ICD-10-CM

## 2021-04-08 DIAGNOSIS — R194 Change in bowel habit: Secondary | ICD-10-CM | POA: Diagnosis not present

## 2021-04-08 MED ORDER — PEG 3350-KCL-NA BICARB-NACL 420 G PO SOLR: ORAL | 0 refills | Status: DC

## 2021-04-08 NOTE — Addendum Note (Signed)
Addended by: Wayna Chalet on: 04/08/2021 02:09 PM   Modules accepted: Orders

## 2021-04-08 NOTE — Progress Notes (Signed)
[name] MD, MRCP(U.K) [address]  [place]  [place], [place] [address]  Main: [phone]  Fax: [phone]   Primary Care Physician: Tower, [name], MD  Primary Gastroenterologist:  Dr. [name]   Follow-up for GI issues   HPI: Debbie Richardson is a 61 y.o. female   Summary of history :  I have seen her for the past few years for IBS diarrhea.  Successfully treated with repeat  courses of antibiotics with past response to Xifaxan.   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 Undergone BSO in 2016  .  02/27/17- Ct abdomen - diverticulosis of the colon , low stool burden.    Colonoscopy in 04/2014 showed diverticulosis and internal hemorroids. EGD 04/2014 showed - small gastric polyps(fundic gland polyps) otherwise normal.I treated her empirically with doxycycline for 2 weeks , I felt that the adhesions from surgery were likely contributing to stasis and small bowel bacterial overgrowth. Completed 2 rounds of doxycycline.Recurrence of bloating symptoms in October 2019 given a course of Augmentin which helped partially but did not resolve the issue.  In December 2019 treated with Xifaxan provided samples.  Another round of treatment in June 2020 with Xifaxan.  02/08/2021 treated IBS diarrhea with 14 days of Xifaxan due to recurrence of symptoms      Interval history 02/08/2021-04/08/2021  03/28/2021 ER visit for left lower quadrant pain she underwent a CT scan of the abdomen in the ER that showed mild wall thickening with adjacent stranding involving the descending and sigmoid colon reflecting mild colitis..  Labs showed a normal lipase, C BC and CMP.  Treated with Zofran and Bentyl and discharged home.  She states that after she was given the last course of hypertension she really did not start to the point of time.  She had a respiratory tract illness and was placed on Augmentin which made her stomach really  upset and subsequently had a bad bout of acid reflux probably related to the coughing commenced on Prilosec.  She felt better after that then ate a lot of food during Christmas and states that all her GI symptoms return subsequently developed left lower quadrant pain which was quite severe and took her to the emergency room associated with loose stools at that point of time she was also taking NSAIDs at that point in time.  Subsequently since then she started to take her Xifaxan feeling much better now but still has left lower quadrant discomfort and some change in her bowel habits which is making her uncomfortable.   Current Outpatient Medications  Medication Sig Dispense Refill   acetaminophen (TYLENOL) 500 MG tablet Take 2 tablets (1,000 mg total) by mouth every 6 (six) hours. 30 tablet 0   amoxicillin-clavulanate (AUGMENTIN) 875-125 MG tablet Take 1 tablet by mouth 2 (two) times daily. 14 tablet 0   benzonatate (TESSALON) 200 MG capsule Take 1 capsule (200 mg total) by mouth 3 (three) times daily as needed for cough. Do not bite pill 30 capsule 1   ibuprofen (ADVIL) 200 MG tablet Take 400 mg by mouth every 6 (six) hours as needed.     ondansetron (ZOFRAN-ODT) 4 MG disintegrating tablet Take 1 tablet (4 mg total) by mouth every 8 (eight) hours as needed for nausea or vomiting. 12 tablet 0   pantoprazole (PROTONIX) 40 MG tablet Take 1 tablet (40 mg total) by mouth daily. 90 tablet 3   dicyclomine (BENTYL) 10  MG capsule Take 1 capsule (10 mg total) by mouth 4 (four) times daily -  before meals and at bedtime for 10 days. 40 capsule 0   No current facility-administered medications for this visit.   Facility-Administered Medications Ordered in Other Visits  Medication Dose Route Frequency Provider Last Rate Last Admin   sodium chloride 0.9 % injection 10 mL  10 mL Intravenous PRN Choksi, Janak, MD   10 mL at 11/19/14 1628   sodium chloride flush (NS) 0.9 % injection 10 mL  10 mL Intravenous PRN  Charlaine Dalton R, MD   10 mL at 10/28/15 1135   sodium chloride flush (NS) 0.9 % injection 10 mL  10 mL Intravenous PRN Cammie Sickle, MD   10 mL at 03/20/17 0831    Allergies as of 04/08/2021 - Review Complete 04/08/2021  Allergen Reaction Noted   Ciprofloxacin Anaphylaxis 08/19/2013   Paclitaxel Shortness Of Breath and Other (See Comments) 06/27/2014   Flagyl [metronidazole] Other (See Comments) 06/22/2015   Nutmeg oil (myristica oil) Other (See Comments) 02/27/2019    ROS:  General: Negative for anorexia, weight loss, fever, chills, fatigue, weakness. ENT: Negative for hoarseness, difficulty swallowing , nasal congestion. CV: Negative for chest pain, angina, palpitations, dyspnea on exertion, peripheral edema.  Respiratory: Negative for dyspnea at rest, dyspnea on exertion, cough, sputum, wheezing.  GI: See history of present illness. GU:  Negative for dysuria, hematuria, urinary incontinence, urinary frequency, nocturnal urination.  Endo: Negative for unusual weight change.    Physical Examination:   BP (!) 111/58 (BP Location: Left Arm, Patient Position: Sitting, Cuff Size: Normal)    Pulse 92    Temp 98 F (36.7 C) (Oral)    Ht 5' (1.524 m)    Wt 135 lb 9.6 oz (61.5 kg)    LMP 03/30/2013    BMI 26.48 kg/m   General: Well-nourished, well-developed in no acute distress.  Eyes: No icterus. Conjunctivae pink. Abdomen: Bowel sounds are normal, nontender, nondistended, no hepatosplenomegaly or masses, no abdominal bruits or hernia , no rebound or guarding.   Extremities: No lower extremity edema. No clubbing or deformities. Neuro: Alert and oriented x 3.  Grossly intact. Skin: Warm and dry, no jaundice.   Psych: Alert and cooperative, normal mood and affect.   Imaging Studies: CT Abdomen Pelvis W Contrast  Result Date: 03/28/2021 CLINICAL DATA:  Left sided abdominal pain EXAM: CT ABDOMEN AND PELVIS WITH CONTRAST TECHNIQUE: Multidetector CT imaging of the abdomen  and pelvis was performed using the standard protocol following bolus administration of intravenous contrast. CONTRAST:  112mL OMNIPAQUE IOHEXOL 300 MG/ML  SOLN COMPARISON:  March 21, 2018. FINDINGS: Lower chest: No acute abnormality. Hepatobiliary: No suspicious hepatic lesion. Prominence of the intra and extrahepatic biliary tree favored reservoir effect post cholecystectomy. Gallbladder surgically absent. Pancreas: No pancreatic ductal dilation or evidence of acute inflammation. Spleen: Within normal limits. Adrenals/Urinary Tract: Bilateral adrenal glands are unremarkable. No hydronephrosis. No solid enhancing renal mass. Symmetric enhancement excretion of contrast from the bilateral kidneys. Urinary bladder is unremarkable for degree of distension. Stomach/Bowel: No enteric contrast was administered. Stomach is unremarkable for degree of distension. No pathologic dilation of small or large bowel. The appendix and terminal ileum appear normal. Descending and sigmoid colonic diverticulosis. Mild wall thickening with adjacent inflammation involving the descending and sigmoid colon. Vascular/Lymphatic: No significant vascular findings are present. No enlarged abdominal or pelvic lymph nodes. Reproductive: Status post hysterectomy. No adnexal masses. Other: No significant abdominopelvic free fluid. No pneumoperitoneum.  No portal venous gas. Musculoskeletal: No acute osseous abnormality. IMPRESSION: 1. Mild wall thickening with adjacent stranding involving the descending and sigmoid colon which may reflect mild colitis. 2. Prominence of the intra and extrahepatic biliary tree favored reservoir effect post cholecystectomy. Electronically Signed   By: Dahlia Bailiff M.D.   On: 03/28/2021 17:06    Assessment and Plan:   Debbie Richardson is a 61 y.o. y/o female who has had multiple courses of antibiotics for IBS diarrhea.  Likely underlying adhesions from scar tissue might have contributed to the same.  Recent  course of ER visit which showed left-sided colitis.  Her symptoms are still persisting but no diarrhea at this point of time but has discomfort and has noticed a change in her bowel habits.  Since this is a change from her baseline would recommend evaluation to rule out microscopic colitis or other conditions.  She is in the middle of her Xifaxan course and has a week more to complete  I have discussed alternative options, risks & benefits,  which include, but are not limited to, bleeding, infection, perforation,respiratory complication & drug reaction.  The patient agrees with this plan & written consent will be obtained.       Dr [name]  MD,MRCP [facility]) Follow up in as needed

## 2021-04-19 ENCOUNTER — Other Ambulatory Visit: Payer: Self-pay | Admitting: Family Medicine

## 2021-04-28 ENCOUNTER — Encounter: Payer: Self-pay | Admitting: Gastroenterology

## 2021-04-29 ENCOUNTER — Encounter
Admission: RE | Disposition: A | Discharge status: Home / Self Care | Payer: Self-pay | Source: Home / Self Care | Attending: Gastroenterology

## 2021-04-29 ENCOUNTER — Ambulatory Visit: Payer: BC Managed Care – PPO | Admitting: Anesthesiology

## 2021-04-29 ENCOUNTER — Ambulatory Visit
Admission: RE | Admit: 2021-04-29 | Discharge: 2021-04-29 | Disposition: A | Discharge status: Home / Self Care | Payer: BC Managed Care – PPO | Attending: Gastroenterology | Admitting: Gastroenterology

## 2021-04-29 ENCOUNTER — Encounter: Payer: Self-pay | Admitting: Gastroenterology

## 2021-04-29 DIAGNOSIS — K529 Noninfective gastroenteritis and colitis, unspecified: Secondary | ICD-10-CM | POA: Insufficient documentation

## 2021-04-29 DIAGNOSIS — K635 Polyp of colon: Secondary | ICD-10-CM

## 2021-04-29 DIAGNOSIS — Z8543 Personal history of malignant neoplasm of ovary: Secondary | ICD-10-CM | POA: Insufficient documentation

## 2021-04-29 DIAGNOSIS — R197 Diarrhea, unspecified: Secondary | ICD-10-CM | POA: Diagnosis not present

## 2021-04-29 DIAGNOSIS — K5730 Diverticulosis of large intestine without perforation or abscess without bleeding: Secondary | ICD-10-CM | POA: Diagnosis not present

## 2021-04-29 DIAGNOSIS — K219 Gastro-esophageal reflux disease without esophagitis: Secondary | ICD-10-CM | POA: Insufficient documentation

## 2021-04-29 DIAGNOSIS — D126 Benign neoplasm of colon, unspecified: Secondary | ICD-10-CM | POA: Diagnosis not present

## 2021-04-29 DIAGNOSIS — D122 Benign neoplasm of ascending colon: Secondary | ICD-10-CM | POA: Insufficient documentation

## 2021-04-29 DIAGNOSIS — R194 Change in bowel habit: Secondary | ICD-10-CM

## 2021-04-29 DIAGNOSIS — K649 Unspecified hemorrhoids: Secondary | ICD-10-CM | POA: Diagnosis not present

## 2021-04-29 HISTORY — PX: COLONOSCOPY WITH PROPOFOL: SHX5780

## 2021-04-29 SURGERY — COLONOSCOPY WITH PROPOFOL
Anesthesia: General

## 2021-04-29 MED ORDER — SODIUM CHLORIDE 0.9 % IV SOLN: INTRAVENOUS | Status: DC

## 2021-04-29 MED ORDER — PROPOFOL 10 MG/ML IV BOLUS
INTRAVENOUS | Status: DC | PRN
  Administered 2021-04-29: 70 mg via INTRAVENOUS

## 2021-04-29 MED ORDER — PROPOFOL 500 MG/50ML IV EMUL
INTRAVENOUS | Status: DC | PRN
  Administered 2021-04-29: 140 ug/kg/min via INTRAVENOUS

## 2021-04-29 MED ORDER — LIDOCAINE HCL (CARDIAC) PF 100 MG/5ML IV SOSY
PREFILLED_SYRINGE | INTRAVENOUS | Status: DC | PRN
  Administered 2021-04-29: 40 mg via INTRAVENOUS

## 2021-04-29 NOTE — Transfer of Care (Signed)
Immediate Anesthesia Transfer of Care Note  Patient: Debbie Richardson  Procedure(s) Performed: COLONOSCOPY WITH PROPOFOL  Patient Location: PACU  Anesthesia Type:General  Level of Consciousness: awake and alert  Airway & Oxygen Therapy: Patient Spontanous Breathing  Post-op Assessment: Report given to RN and Post -op Vital signs reviewed and stable  Post vital signs: Reviewed and stable  Last Vitals:  Vitals Value Taken Time  BP 95/73 04/29/21 0923  Temp    Pulse 100 04/29/21 0924  Resp 25 04/29/21 0924  SpO2 100 % 04/29/21 0924  Vitals shown include unvalidated device data.  Last Pain:  Vitals:   04/29/21 0831  TempSrc: Temporal  PainSc: 0-No pain         Complications: No notable events documented.

## 2021-04-29 NOTE — Op Note (Signed)
[facility] Patient Name: Debbie Richardson Procedure Date: 04/29/2021 8:54 AM MRN: [id number] Account #: [id number] Date of Birth: July 19, 1960 Admit Type: Outpatient Age: 61 Room: [facility] ENDO ROOM 3 Gender: Female Note Status: Finalized Instrument Name: Colonoscope [id number],Colonscope [id number] Procedure:             Colonoscopy Indications:           Chronic diarrhea Providers:             [name] MD, MD Referring MD:          [name]. Tower (Referring MD) Medicines:             Monitored Anesthesia Care Complications:         No immediate complications. Procedure:             Pre-Anesthesia Assessment:                        - Prior to the procedure, a History and Physical was                         performed, and patient medications, allergies and                         sensitivities were reviewed. The patient's tolerance                         of previous anesthesia was reviewed.                        - The risks and benefits of the procedure and the                         sedation options and risks were discussed with the                         patient. All questions were answered and informed                         consent was obtained.                        - The risks and benefits of the procedure and the                         sedation options and risks were discussed with the                         patient. All questions were answered and informed                         consent was obtained.                        After obtaining informed consent, the colonoscope was                         passed under direct vision. Throughout the procedure,                         the  patient's blood pressure, pulse, and oxygen                         saturations were monitored continuously. The                         Colonoscope was introduced through the anus and                         advanced to the the cecum, identified by the                          appendiceal orifice. The Colonoscope was introduced                         through the and advanced to the. The colonoscopy was                         performed with ease. The patient tolerated the                         procedure well. The quality of the bowel preparation                         was excellent. Findings:      The perianal and digital rectal examinations were normal.      Two sessile polyps were found in the ascending colon. The polyps were 3       to 5 mm in size. These polyps were removed with a cold snare. Resection       and retrieval were complete.      The terminal ileum appeared normal. Biopsies were taken with a cold       forceps for histology.      Normal mucosa was found in the entire colon. Biopsies were taken with a       cold forceps for histology.      Multiple small-mouthed diverticula were found in the sigmoid colon.      The exam was otherwise without abnormality on direct and retroflexion       views. Impression:            - Two 3 to 5 mm polyps in the ascending colon, removed                         with a cold snare. Resected and retrieved.                        - The examined portion of the ileum was normal.                         Biopsied.                        - Normal mucosa in the entire examined colon. Biopsied.                        - Diverticulosis in the sigmoid colon.                        - The  examination was otherwise normal on direct and                         retroflexion views. Recommendation:        - Discharge patient to home (with escort).                        - Resume previous diet.                        - Continue present medications.                        - Await pathology results.                        - Repeat colonoscopy for surveillance based on                         pathology results.                        - Return to GI office as previously scheduled. Procedure Code(s):     ---  Professional ---                        [phone], Colonoscopy, flexible; with removal of                         tumor(s), polyp(s), or other lesion(s) by snare                         technique                        45380, 110, Colonoscopy, flexible; with biopsy, single                         or multiple Diagnosis Code(s):     --- Professional ---                        K63.5, Polyp of colon                        K52.9, Noninfective gastroenteritis and colitis,                         unspecified                        K57.30, Diverticulosis of large intestine without                         perforation or abscess without bleeding CPT copyright 2019 American Medical Association. All rights reserved. The codes documented in this report are preliminary and upon coder review may  be revised to meet current compliance requirements. [name], MD [name] MD, MD 04/29/2021 9:24:20 AM This report has been signed electronically. Number of Addenda: 0 Note Initiated On: 04/29/2021 8:54 AM Scope Withdrawal Time: 0 hours 13 minutes 11 seconds  Total Procedure Duration: 0 hours 15 minutes 59 seconds  Estimated Blood Loss:  Estimated blood loss: none.  [facility]

## 2021-04-29 NOTE — H&P (Signed)
[name], MD [address] [place], [place], [place], [address] [address], [place], [place], [place], [address] Phone: [phone]  Fax: [phone]  Primary Care Physician:  Tower, [name], MD   Pre-Procedure History & Physical: HPI:  GHINA Richardson is a 61 y.o. female is here for an colonoscopy.   Past Medical History:  Diagnosis Date   Anxiety    Arthritis    Benign fundic gland polyps of stomach    Dizziness    vertigo   Gallstones    GERD (gastroesophageal reflux disease)    Ovarian cancer ([place]) 2016   Personal history of chemotherapy     Past Surgical History:  Procedure Laterality Date   CESAREAN SECTION     CHOLECYSTECTOMY     COLONOSCOPY     DIAGNOSTIC LAPAROSCOPY     LAPAROSCOPIC HYSTERECTOMY N/A 10/29/2014   Procedure: HYSTERECTOMY TOTAL LAPAROSCOPIC, OMENTUMECTOMY;  Surgeon: Gillis Ends, MD;  Location: [facility] ORS;  Service: Gynecology;  Laterality: N/A;   LAPAROSCOPIC OOPHERECTOMY     OOPHORECTOMY     PORTA CATH REMOVAL N/A 04/26/2018   Procedure: PORTA CATH REMOVAL;  Surgeon: Algernon Huxley, MD;  Location: [facility];  Service: Cardiovascular;  Laterality: N/A;   UPPER GI ENDOSCOPY      Prior to Admission medications   Medication Sig Start Date End Date Taking? Authorizing Provider  acetaminophen (TYLENOL) 500 MG tablet Take 2 tablets (1,000 mg total) by mouth every 6 (six) hours. 10/30/14   Will Bonnet, MD  amoxicillin-clavulanate (AUGMENTIN) 875-125 MG tablet Take 1 tablet by mouth 2 (two) times daily. 02/15/21   Tower, [name], MD  benzonatate (TESSALON) 200 MG capsule Take 1 capsule (200 mg total) by mouth 3 (three) times daily as needed for cough. Do not bite pill 02/12/21   Tower, [name], MD  dicyclomine (BENTYL) 10 MG capsule Take 1 capsule (10 mg total) by mouth 4 (four) times daily -  before meals and at bedtime for 10 days. 03/28/21 04/07/21  Blake Divine, MD  ibuprofen (ADVIL) 200 MG tablet Take 400 mg  by mouth every 6 (six) hours as needed.    [provider]  ondansetron (ZOFRAN-ODT) 4 MG disintegrating tablet Take 1 tablet (4 mg total) by mouth every 8 (eight) hours as needed for nausea or vomiting. 03/28/21   Blake Divine, MD  pantoprazole (PROTONIX) 40 MG tablet TAKE 1 TABLET BY MOUTH EVERY DAY 04/20/21   Tower, [name], MD  polyethylene glycol-electrolytes (NULYTELY) 420 g solution Prepare according to package instructions. Starting at 5:00 PM: Drink one 8 oz glass of mixture every 15 minutes until you finish half of the jug. Five hours prior to procedure, drink 8 oz glass of mixture every 15 minutes until it is all gone. Make sure you do not drink anything 4 hours prior to your procedure. 04/08/21   [name], MD    Allergies as of 04/08/2021 - Review Complete 04/08/2021  Allergen Reaction Noted   Ciprofloxacin Anaphylaxis 08/19/2013   Paclitaxel Shortness Of Breath and Other (See Comments) 06/27/2014   Flagyl [metronidazole] Other (See Comments) 06/22/2015   Nutmeg oil (myristica oil) Other (See Comments) 02/27/2019    Family History  Problem Relation Age of Onset   Hypertension Mother        66yo    Hyperlipidemia Mother    GER disease Mother    Heart disease Father        6yo  Breast cancer Sister 73   Heart disease Paternal Grandmother    Breast cancer Paternal Grandmother 69       deceased 69   Kidney disease Neg Hx    Liver disease Neg Hx    Colon cancer Neg Hx    Colon polyps Neg Hx    Esophageal cancer Neg Hx    Pancreatic cancer Neg Hx     Social History   Socioeconomic History   Marital status: Divorced    Spouse name: Not on file   Number of children: 1   Years of education: Not on file   Highest education level: Not on file  Occupational History   Not on file  Tobacco Use   Smoking status: Former    Types: Cigarettes    Quit date: 04/05/1991    Years since quitting: 30.0   Smokeless tobacco: Never  Vaping Use   Vaping Use: Never used   Substance and Sexual Activity   Alcohol use: Yes    Alcohol/week: 0.0 standard drinks    Comment: 1 drink of wine daily   Drug use: No   Sexual activity: Yes  Other Topics Concern   Not on file  Social History Narrative   Not on file   Social Determinants of Health   Financial Resource Strain: Not on file  Food Insecurity: Not on file  Transportation Needs: Not on file  Physical Activity: Not on file  Stress: Not on file  Social Connections: Not on file  Intimate Partner Violence: Not on file    Review of Systems: See HPI, otherwise negative ROS  Physical Exam: BP 110/79    Pulse 94    Temp (!) 96.9 F (36.1 C) (Temporal)    Resp 16    Ht 5\' 2"  (1.575 m)    Wt 59.9 kg    LMP 03/30/2013    SpO2 100%    BMI 24.14 kg/m  General:   Alert,  pleasant and cooperative in NAD Head:  Normocephalic and atraumatic. Neck:  Supple; no masses or thyromegaly. Lungs:  Clear throughout to auscultation, normal respiratory effort.    Heart:  +S1, +S2, Regular rate and rhythm, No edema. Abdomen:  Soft, nontender and nondistended. Normal bowel sounds, without guarding, and without rebound.   Neurologic:  Alert and  oriented x4;  grossly normal neurologically.  Impression/Plan: Debbie Richardson is here for an colonoscopy to be performed for diarrhea.   Risks, benefits, limitations, and alternatives regarding  colonoscopy have been reviewed with the patient.  Questions have been answered.  All parties agreeable.   [name], MD  04/29/2021, 8:57 AM

## 2021-04-29 NOTE — Anesthesia Postprocedure Evaluation (Signed)
Anesthesia Post Note  Patient: Debbie Richardson  Procedure(s) Performed: COLONOSCOPY WITH PROPOFOL  Patient location during evaluation: PACU Anesthesia Type: General Level of consciousness: awake and alert, oriented and patient cooperative Pain management: pain level controlled Vital Signs Assessment: post-procedure vital signs reviewed and stable Respiratory status: spontaneous breathing, nonlabored ventilation and respiratory function stable Cardiovascular status: blood pressure returned to baseline and stable Postop Assessment: adequate PO intake Anesthetic complications: no   No notable events documented.   Last Vitals:  Vitals:   04/29/21 0933 04/29/21 0943  BP: 104/73 113/85  Pulse: 88 82  Resp: 15 17  Temp:    SpO2: 100% 100%    Last Pain:  Vitals:   04/29/21 0943  TempSrc:   PainSc: 0-No pain                 Darrin Nipper

## 2021-04-29 NOTE — Anesthesia Preprocedure Evaluation (Addendum)
Anesthesia Evaluation  Patient identified by MRN, date of birth, ID band Patient awake    Reviewed: Allergy & Precautions, NPO status , Patient's Chart, lab work & pertinent test results  History of Anesthesia Complications Negative for: history of anesthetic complications  Airway Mallampati: I   Neck ROM: Full    Dental   Missing lower left molar:   Pulmonary former smoker (quit 1993),    Pulmonary exam normal breath sounds clear to auscultation       Cardiovascular Exercise Tolerance: Good negative cardio ROS Normal cardiovascular exam Rhythm:Regular Rate:Normal     Neuro/Psych PSYCHIATRIC DISORDERS Anxiety Vertigo     GI/Hepatic GERD  ,Colitis    Endo/Other  negative endocrine ROS  Renal/GU negative Renal ROS     Musculoskeletal  (+) Arthritis ,   Abdominal   Peds  Hematology negative hematology ROS (+)   Anesthesia Other Findings   Reproductive/Obstetrics Ovarian CA                            Anesthesia Physical Anesthesia Plan  ASA: 2  Anesthesia Plan: General   Post-op Pain Management:    Induction: Intravenous  PONV Risk Score and Plan: 3 and Propofol infusion, TIVA and Treatment may vary due to age or medical condition  Airway Management Planned: Natural Airway  Additional Equipment:   Intra-op Plan:   Post-operative Plan:   Informed Consent: I have reviewed the patients History and Physical, chart, labs and discussed the procedure including the risks, benefits and alternatives for the proposed anesthesia with the patient or authorized representative who has indicated his/her understanding and acceptance.       Plan Discussed with: CRNA  Anesthesia Plan Comments: (LMA/GETA backup discussed.  Patient consented for risks of anesthesia including but not limited to:  - adverse reactions to medications - damage to eyes, teeth, lips or other oral mucosa - nerve  damage due to positioning  - sore throat or hoarseness - damage to heart, brain, nerves, lungs, other parts of body or loss of life  Informed patient about role of CRNA in peri- and intra-operative care.  Patient voiced understanding.)        Anesthesia Quick Evaluation

## 2021-04-30 ENCOUNTER — Encounter: Payer: Self-pay | Admitting: Gastroenterology

## 2021-04-30 LAB — SURGICAL PATHOLOGY

## 2021-05-03 ENCOUNTER — Telehealth: Payer: Self-pay

## 2021-05-03 NOTE — Telephone Encounter (Signed)
Patient was contacted and I was able to reschedule her appointment to 05/17/2021 at 1:45 PM. Patient agreed and had no further questions.

## 2021-05-03 NOTE — Telephone Encounter (Signed)
Patient had colonoscopy on 04/29/21 with Dr. Vicente Males. She states dr Vicente Males wanted to see her after the colonoscopy. Made appointment for 07/06/21 at 3:30 that was next available. Patient states she is still having problems and does not think she can wait till then.

## 2021-05-17 ENCOUNTER — Other Ambulatory Visit: Payer: Self-pay

## 2021-05-17 ENCOUNTER — Encounter: Payer: Self-pay | Admitting: Gastroenterology

## 2021-05-17 ENCOUNTER — Ambulatory Visit: Payer: BC Managed Care – PPO | Admitting: Gastroenterology

## 2021-05-17 VITALS — BP 113/67 | HR 67 | Temp 98.2°F | Wt 135.0 lb

## 2021-05-17 DIAGNOSIS — R194 Change in bowel habit: Secondary | ICD-10-CM | POA: Diagnosis not present

## 2021-05-17 DIAGNOSIS — K580 Irritable bowel syndrome with diarrhea: Secondary | ICD-10-CM | POA: Diagnosis not present

## 2021-05-17 NOTE — Progress Notes (Signed)
[name] MD, MRCP(U.K) [address]  [place]  [place], [place] [address]  Main: [phone]  Fax: [phone]   Primary Care Physician: Tower, [name], MD  Primary Gastroenterologist:  Dr. [name]   Chief Complaint  Patient presents with   Irritable Bowel Syndrome    HPI: Debbie Richardson is a 61 y.o. 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  Successfully treated with repeat  courses of antibiotics with past response to Xifaxan.   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 Undergone BSO in 2016  .  02/27/17- Ct abdomen - diverticulosis of the colon , low stool burden.    Colonoscopy in 04/2014 showed diverticulosis and internal hemorroids. EGD 04/2014 showed - small gastric polyps(fundic gland polyps) otherwise normal.I treated her empirically with doxycycline for 2 weeks , I felt that the adhesions from surgery were likely contributing to stasis and small bowel bacterial overgrowth. Completed 2 rounds of doxycycline.Recurrence of bloating symptoms in October 2019 given a course of Augmentin which helped partially but did not resolve the issue.  In December 2019 treated with Xifaxan provided samples.  Another round of treatment in June 2020 with Xifaxan.   02/08/2021 treated IBS diarrhea with 14 days of Xifaxan due to recurrence of symptoms 03/28/2021 ER visit for left lower quadrant pain she underwent a CT scan of the abdomen in the ER that showed mild wall thickening with adjacent stranding involving the descending and sigmoid colon reflecting mild colitis..  Labs showed a normal lipase, C BC and CMP.  Treated with Zofran and Bentyl and discharged home.     Interval history 04/08/2021-05/17/2021   04/29/2021: colonoscopy : 2 sessile polyps resected tubular adenomas ,  : mucosa of colon appeared normal. Random colon and TI biopsies were normal    She has been taking  the course of antibiotics.  No diarrhea presently.  Still has bloating and gas issues when she has a meal.  Tightness in her abdomen.   Current Outpatient Medications  Medication Sig Dispense Refill   acetaminophen (TYLENOL) 500 MG tablet Take 2 tablets (1,000 mg total) by mouth every 6 (six) hours. 30 tablet 0   amoxicillin-clavulanate (AUGMENTIN) 875-125 MG tablet Take 1 tablet by mouth 2 (two) times daily. 14 tablet 0   benzonatate (TESSALON) 200 MG capsule Take 1 capsule (200 mg total) by mouth 3 (three) times daily as needed for cough. Do not bite pill 30 capsule 1   dicyclomine (BENTYL) 10 MG capsule Take 1 capsule (10 mg total) by mouth 4 (four) times daily -  before meals and at bedtime for 10 days. 40 capsule 0   ibuprofen (ADVIL) 200 MG tablet Take 400 mg by mouth every 6 (six) hours as needed.     ondansetron (ZOFRAN-ODT) 4 MG disintegrating tablet Take 1 tablet (4 mg total) by mouth every 8 (eight) hours as needed for nausea or vomiting. 12 tablet 0   pantoprazole (PROTONIX) 40 MG tablet TAKE 1 TABLET BY MOUTH EVERY DAY 90 tablet 3   polyethylene glycol-electrolytes (NULYTELY) 420 g solution Prepare according to package instructions. Starting at 5:00 PM: Drink one 8 oz glass of mixture every 15 minutes until you finish half of the jug. Five hours prior to procedure, drink 8 oz glass of mixture every 15 minutes until it is all gone. Make sure you do not drink  anything 4 hours prior to your procedure. 4000 mL 0   No current facility-administered medications for this visit.   Facility-Administered Medications Ordered in Other Visits  Medication Dose Route Frequency Provider Last Rate Last Admin   sodium chloride 0.9 % injection 10 mL  10 mL Intravenous PRN Choksi, Janak, MD   10 mL at 11/19/14 1628   sodium chloride flush (NS) 0.9 % injection 10 mL  10 mL Intravenous PRN Charlaine Dalton R, MD   10 mL at 10/28/15 1135   sodium chloride flush (NS) 0.9 % injection 10 mL  10 mL  Intravenous PRN Cammie Sickle, MD   10 mL at 03/20/17 0831    Allergies as of 05/17/2021 - Review Complete 04/29/2021  Allergen Reaction Noted   Ciprofloxacin Anaphylaxis 08/19/2013   Paclitaxel Shortness Of Breath and Other (See Comments) 06/27/2014   Flagyl [metronidazole] Other (See Comments) 06/22/2015   Nutmeg oil (myristica oil) Other (See Comments) 02/27/2019    ROS:  General: Negative for anorexia, weight loss, fever, chills, fatigue, weakness. ENT: Negative for hoarseness, difficulty swallowing , nasal congestion. CV: Negative for chest pain, angina, palpitations, dyspnea on exertion, peripheral edema.  Respiratory: Negative for dyspnea at rest, dyspnea on exertion, cough, sputum, wheezing.  GI: See history of present illness. GU:  Negative for dysuria, hematuria, urinary incontinence, urinary frequency, nocturnal urination.  Endo: Negative for unusual weight change.    Physical Examination:   LMP 03/30/2013   General: Well-nourished, well-developed in no acute distress.  Eyes: No icterus. Conjunctivae pink. Neuro: Alert and oriented x 3.  Grossly intact. Skin: Warm and dry, no jaundice.   Psych: Alert and cooperative, normal mood and affect.   Imaging Studies: No results found.  Assessment and Plan:   Debbie Richardson is a 61 y.o. y/o female who has had multiple courses of antibiotics for IBS diarrhea.  Likely underlying adhesions from scar tissue might have contributed to the same.  Recent course of ER visit which showed left-sided colitis. Colonoscopy in 04/2021 shows normal biopsies of the colon mucosa and TI biopsies.  Suggested to try charcoal tablets up to 3 times a day as needed.  We will try to obtain a free kit to check for sucrase insufficiency.    Dr [name]  MD,MRCP [facility]) Follow up in 8 to 12 weeks telephone visit

## 2021-05-21 NOTE — Telephone Encounter (Signed)
PA approval, valid 12/09/2020- 12/08/2021.

## 2021-05-23 ENCOUNTER — Encounter: Payer: Self-pay | Admitting: Gastroenterology

## 2021-05-24 ENCOUNTER — Telehealth: Payer: Self-pay | Admitting: *Deleted

## 2021-05-24 NOTE — Telephone Encounter (Signed)
Called pt to make appt cant find a time she had colonscopy and having issues like before and was told she can come in to discuss. Please advise

## 2021-05-24 NOTE — Telephone Encounter (Signed)
Patient would like to schedule an appointment with Dr. [name].

## 2021-05-26 ENCOUNTER — Inpatient Hospital Stay: Payer: BC Managed Care – PPO

## 2021-05-26 ENCOUNTER — Other Ambulatory Visit: Payer: Self-pay

## 2021-05-26 ENCOUNTER — Inpatient Hospital Stay: Payer: BC Managed Care – PPO | Attending: Obstetrics and Gynecology | Admitting: Obstetrics and Gynecology

## 2021-05-26 VITALS — BP 117/86 | HR 95 | Temp 98.7°F | Resp 18 | Wt 134.5 lb

## 2021-05-26 DIAGNOSIS — M549 Dorsalgia, unspecified: Secondary | ICD-10-CM | POA: Diagnosis not present

## 2021-05-26 DIAGNOSIS — Z90722 Acquired absence of ovaries, bilateral: Secondary | ICD-10-CM | POA: Diagnosis not present

## 2021-05-26 DIAGNOSIS — Z9221 Personal history of antineoplastic chemotherapy: Secondary | ICD-10-CM | POA: Diagnosis not present

## 2021-05-26 DIAGNOSIS — R102 Pelvic and perineal pain: Secondary | ICD-10-CM | POA: Insufficient documentation

## 2021-05-26 DIAGNOSIS — K5900 Constipation, unspecified: Secondary | ICD-10-CM | POA: Diagnosis not present

## 2021-05-26 DIAGNOSIS — K5909 Other constipation: Secondary | ICD-10-CM

## 2021-05-26 DIAGNOSIS — R1030 Lower abdominal pain, unspecified: Secondary | ICD-10-CM

## 2021-05-26 DIAGNOSIS — Z8543 Personal history of malignant neoplasm of ovary: Secondary | ICD-10-CM | POA: Diagnosis not present

## 2021-05-26 DIAGNOSIS — R197 Diarrhea, unspecified: Secondary | ICD-10-CM | POA: Diagnosis not present

## 2021-05-26 DIAGNOSIS — C569 Malignant neoplasm of unspecified ovary: Secondary | ICD-10-CM

## 2021-05-26 DIAGNOSIS — R109 Unspecified abdominal pain: Secondary | ICD-10-CM | POA: Diagnosis not present

## 2021-05-26 DIAGNOSIS — Z90710 Acquired absence of both cervix and uterus: Secondary | ICD-10-CM | POA: Diagnosis not present

## 2021-05-26 NOTE — Progress Notes (Signed)
Gynecologic Oncology Interval Note  Referring Provider: Dr. 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 She is on control arm of GOG225 and completed the active portion of the study on 03/15/17.   She complains of worsening GI symptoms and is concerned about recurrence. See CT below. She was seen by GI and had a colonoscopy. Etiology of symptoms unknown.  03/28/2021 TECHNIQUE: Multidetector CT imaging of the abdomen and pelvis was performed using the standard protocol following bolus administration of intravenous contrast.   CONTRAST:  169mL OMNIPAQUE IOHEXOL 300 MG/ML  SOLN   COMPARISON:  March 21, 2018.   FINDINGS: Lower chest: No acute abnormality.   Hepatobiliary: No suspicious hepatic lesion. Prominence of the intra and extrahepatic biliary tree favored reservoir effect post cholecystectomy. Gallbladder surgically absent.   Pancreas: No pancreatic ductal dilation or evidence of acute inflammation.   Spleen: Within normal limits.   Adrenals/Urinary Tract: Bilateral adrenal glands are unremarkable. No hydronephrosis. No solid enhancing renal mass. Symmetric enhancement excretion of contrast from the bilateral kidneys. Urinary bladder is unremarkable for degree of distension.   Stomach/Bowel: No enteric contrast was administered. Stomach is unremarkable for degree of distension. No pathologic dilation of small or large bowel. The appendix and terminal ileum appear normal. Descending and sigmoid colonic diverticulosis. Mild wall thickening with adjacent inflammation involving the descending and sigmoid colon.   Vascular/Lymphatic: No significant vascular findings are present. No enlarged abdominal or pelvic lymph  nodes.   Reproductive: Status post hysterectomy. No adnexal masses.   Other: No significant abdominopelvic free fluid. No pneumoperitoneum. No portal venous gas.   Musculoskeletal: No acute osseous abnormality.   IMPRESSION: 1. Mild wall thickening with adjacent stranding involving the descending and sigmoid colon which may reflect mild colitis. 2. Prominence of the intra and extrahepatic biliary tree favored reservoir effect post cholecystectomy.     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 See prior notes for complete details. She was taken emergently to the operating room on 04/28/2014 by Dr. Glennon Mac and a laparoscopic procedure was performed.  Bilateral salpingo-oophorectomy with  placement of the cystic lesions in the Endo Catch bag and removal through the LLQ port. Per the operative note the right ovary did rupture and contents of the cysts were spilled into the pelvis.  The final pathology revealed high-grade serous adenocarcinoma involving both ovaries and fallopian tubes. Washings also revealed clusters of highly atypical glandular cells and surface involvement of the ovary was present. Staging was felt to be stage II at least high grade serous ovarian cancer. Preoperative CA-125 was 45.   She received 6 cycles of carboplatin/taxane chemotherapy via an IV port, last treatment June 2016.  She had an allergy to taxol and was switched to taxotere for the last 4 cycles. She underwent interval TLH/omentectomy on 10/29/2014 for ovarian cancer. Her final pathology was negative. She enrolled on GOG225.   CT scan 01/09/2015 negative  CT scan 06/24/2015 IMPRESSION: No evidence of urolithiasis, hydronephrosis, or other acute Findings.  Colonic diverticulosis. No radiographic evidence of diverticulitis.  CT scan of abdomen and pelvis 12/09/2015 IMPRESSION: Status post hysterectomy and bilateral  salpingo-oophorectomy. No evidence of recurrent or metastatic disease  PET scan 09/19/2016 IMPRESSION: Negative PET-CT.  No findings for hypermetabolic recurrent tumor.  CT scan of abdomen and pelvis 02/27/2017  IMPRESSION: 1. No acute findings are noted in the abdomen or pelvis to account for the patient's symptoms. Notably, there is a low stool burden despite the patient's reported history of constipation. 2. Colonic diverticulosis without evidence of acute diverticulitis at this time. 3. Additional incidental findings, as above.   She had menopausal symptoms and we discussed Effexor therapy, but she did not pursue. She has used magnesium and felt that improved her symptoms.   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 Bailey Mech, GI. She has been on multiple antibiotics and Xifaxan with complete resolution of symptoms. But recurrent symptoms.    09/22/2017 CT scan A/P  IMPRESSION: 1. Uterus and ovaries absent. No pelvic mass. No adenopathy. No a mental lesions. No liver lesions evident. 2. No appreciable bowel wall thickening or bowel obstruction. There are occasional sigmoid diverticula without diverticulitis. No abscess in the abdomen or pelvis. Appendix appears normal. 3. No evident renal or ureteral calculus on either side. No hydronephrosis.   12/13/2017 She saw Dr. Rogue Bussing on  with a negative exam and CA 125 8.9.  03/06/2018  She saw Dr. Bailey Mech, GI, who recommended Suggest trial of activated charcoal tablets and LOW FODMAP diet for Small Intestinal Bacterial Overgrowth. Her symptoms have continued to worsen and she is really miserable.   03/14/2018.  Negative exam and normal Ca125 however, GI symptoms concerning for recurrence versus persistent small intestinal bacterial overgrowth syndrome. CT Abdomen Pelvis on 03/22/2018 showed no acute abdominal/pelvic findings, masses, or adenopathy. No worrisome  peritoneal or omental implants.   Port was removed by Dr. Lucky Cowboy on 04/26/2018.    seen in July 2020 with negative exam and normal CA 125. GI symptoms at that time were improved. She has history of small intestinal bacterial overgrowth syndrome with previous negative imaging in December 2019.   CA-125 results postchemotherapy 02/11/2015  10.4 03/12/2015  10.7 06/10/2015  10.0 03/15/2016   10.1 06/09/2016  9.9 09/14/2016  10.9 12/14/2016  9.8 02/17/17  12.1 03/20/2017  9.9 06/04/2017  11.4 09/13/2017  9.5 12/13/2017  8.9 03/14/2018 10.4 06/13/2018 9.2 09/10/2018 9.7 01/04/2019 10.2 05/06/2019 9.0 12/03/2019  10 09/11/20 8.6   Genetic testing- [employer] 83 gene Multi-Cancer Panel was negative for pathogenic mutation. She did have a VUS: SDHA c. 155C>T.   Screening mammogram - 12/13/2017 Screening Mammogram- Bi-rads category 1: negative   Problem List: Patient Active Problem List   Diagnosis Date Noted   Viral URI with cough 02/12/2021   Axillary pain, left 12/30/2020   Orthopnea 12/30/2020   Breast tenderness 12/30/2020   Cough 12/30/2020   Chest wall pain 08/12/2020   Lipid screening 06/21/2019   History of anaphylaxis 06/21/2019   Screening mammogram, encounter for 01/07/2019   History of ovarian cancer 10/03/2018   Bloating 08/02/2017   Menopause 09/14/2016   Neoplasm, ovary, malignant, unspecified laterality ([place]) 12/09/2015   Acute sinusitis 08/11/2015   Left flank pain 06/22/2015   Lower abdominal pain 11/26/2014   Status post laparoscopic hysterectomy 10/29/2014   Allergic conjunctivitis and rhinitis 07/04/2012   ETD (eustachian tube dysfunction) 07/02/2007   GERD (gastroesophageal reflux disease) 05/01/2007   PSORIASIS NEC 12/07/2006   Vertigo 08/25/2006    Past Medical History: Past Medical History:  Diagnosis Date   Anxiety    Arthritis    Benign fundic gland polyps of stomach  Dizziness    vertigo   Gallstones    GERD (gastroesophageal reflux disease)     Ovarian cancer ([place]) 2016   Personal history of chemotherapy     Past Surgical History: Past Surgical History:  Procedure Laterality Date   CESAREAN SECTION     CHOLECYSTECTOMY     COLONOSCOPY     COLONOSCOPY WITH PROPOFOL N/A 04/29/2021   Procedure: COLONOSCOPY WITH PROPOFOL;  Surgeon: [name], MD;  Location: [facility] ENDOSCOPY;  Service: Gastroenterology;  Laterality: N/A;   DIAGNOSTIC LAPAROSCOPY     LAPAROSCOPIC HYSTERECTOMY N/A 10/29/2014   Procedure: HYSTERECTOMY TOTAL LAPAROSCOPIC, OMENTUMECTOMY;  Surgeon: Gillis Ends, MD;  Location: [facility] ORS;  Service: Gynecology;  Laterality: N/A;   LAPAROSCOPIC OOPHERECTOMY     OOPHORECTOMY     PORTA CATH REMOVAL N/A 04/26/2018   Procedure: PORTA CATH REMOVAL;  Surgeon: Algernon Huxley, MD;  Location: [place] CV LAB;  Service: Cardiovascular;  Laterality: N/A;   UPPER GI ENDOSCOPY      Family History: Family History  Problem Relation Age of Onset   Hypertension Mother        38yo    Hyperlipidemia Mother    GER disease Mother    Heart disease Father        29yo   Breast cancer Sister 51   Heart disease Paternal Grandmother    Breast cancer Paternal Grandmother 38       deceased 24   Kidney disease Neg Hx    Liver disease Neg Hx    Colon cancer Neg Hx    Colon polyps Neg Hx    Esophageal cancer Neg Hx    Pancreatic cancer Neg Hx     Social History: Social History   Socioeconomic History   Marital status: Divorced    Spouse name: Not on file   Number of children: 1   Years of education: Not on file   Highest education level: Not on file  Occupational History   Not on file  Tobacco Use   Smoking status: Former    Types: Cigarettes    Quit date: 04/05/1991    Years since quitting: 30.1   Smokeless tobacco: Never  Vaping Use   Vaping Use: Never used  Substance and Sexual Activity   Alcohol use: Yes    Alcohol/week: 0.0 standard drinks    Comment: 1 drink of wine daily   Drug use: No   Sexual activity:  Yes  Other Topics Concern   Not on file  Social History Narrative   Not on file   Social Determinants of Health   Financial Resource Strain: Not on file  Food Insecurity: Not on file  Transportation Needs: Not on file  Physical Activity: Not on file  Stress: Not on file  Social Connections: Not on file  Intimate Partner Violence: Not on file    Allergies: Allergies  Allergen Reactions   Ciprofloxacin Anaphylaxis   Paclitaxel Shortness Of Breath and Other (See Comments)    Other reaction(s): Tight chest (finding) Chest and facial flushing Flushing, chest tightness, SOB w/ Taxol on 05/26/14 at [facility].   Flagyl [Metronidazole] Other (See Comments)    headache   Nutmeg Oil (Myristica Oil) Other (See Comments)    Tightness in chest and throat and difficulty swallowing and breathing.    Current Medications: Current Outpatient Medications  Medication Sig Dispense Refill   acetaminophen (TYLENOL) 500 MG tablet Take 2 tablets (1,000 mg total) by mouth every 6 (six)  hours. 30 tablet 0   No current facility-administered medications for this visit.   Facility-Administered Medications Ordered in Other Visits  Medication Dose Route Frequency Provider Last Rate Last Admin   sodium chloride 0.9 % injection 10 mL  10 mL Intravenous PRN Forest Gleason, MD   10 mL at 11/19/14 1628   sodium chloride flush (NS) 0.9 % injection 10 mL  10 mL Intravenous PRN Cammie Sickle, MD   10 mL at 10/28/15 1135   sodium chloride flush (NS) 0.9 % injection 10 mL  10 mL Intravenous PRN Cammie Sickle, MD   10 mL at 03/20/17 0831   Review of Systems as per interval history General: decreased appetite o/w no complaints  HEENT: no complaints  Lungs: shortness of breath o/w no complaints  Cardiac: no complaints  GI: worsening diarrhea/constipation o/w no complaints   GU: pelvic pain o/w no complaints  Musculoskeletal: back pain o/w no complaints  Extremities: no complaints  Skin: no  complaints  Neuro: no complaints  Endocrine: no complaints  Psych: no complaints      Objective:   Vitals:   05/26/21 1335  BP: 117/86  Pulse: 95  Resp: 18  Temp: 98.7 F (37.1 C)  SpO2: 99%    Body mass index is 24.6 kg/m.  ECOG Performance Status: 0 - Asymptomatic  GENERAL: Patient is a well appearing female in no acute distress HEENT:  PERRL, ATNC ABDOMEN:  Soft, nontender, nondistended. No ascites, hernia, masses EXTREMITIES:  No peripheral edema.   NEURO:  Nonfocal. Well oriented.  Appropriate affect.  Pelvic: chaperoned by CMA EGBUS: no lesions Cervix: surgically absent Vagina: no lesions, no discharge or bleeding Uterus: surgically absent Adnexa: no palpable masses Rectovaginal: confirmator  Assessment:  Debbie Richardson is a 61 y.o. 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 No evidence of disease on CT or exam and CA125 normal in March 2019. Interval TLH/Omentectomy 7/16 negative for malignancy. Interval imaging significant for diverticular disease. Persistent pain worked up by GI and thought to be consistent with bacterial overgrowth syndrome and adhesive disease. Prior CT scans and PET all negative for recurrent disease. Port removed. Normal CA-125. NED on exam today, but concerning symptoms.    Mild wall thickening with adjacent stranding involving the descending and sigmoid colon which may reflect mild colitis.  Plan:   Problem List Items Addressed This Visit       Endocrine   Neoplasm, ovary, malignant, unspecified laterality ([place]) - Primary   Relevant Orders   NM PET Image Restag (PS) Skull Base To Thigh   CA 125     Other   Lower abdominal pain   Other Visit Diagnoses     Diarrhea, unspecified type       Other constipation          CA 125 and PET ordered. If negative we can discuss further evaluation.    If all normal than follow up in 1 year and  rtc for surveillance visit with CA125.   I personally had a face to face interaction and evaluated the patient jointly with the NP, Ms. Beckey Rutter.  I have reviewed her history and available records and have performed the key portions of the physical exam including abdominal exam, pelvic exam with my findings confirming those documented above by the APP.  I have discussed the case with the APP and the patient.  I agree with the above documentation, assessment and plan which was fully formulated by me.  Counseling was completed by me.   I personally saw the patient and performed a substantive portion of this encounter in conjunction with the listed APP as documented above.  A total of 20 minutes were spent with the patient/family today; >50% was spent in education, counseling and coordination of care for stage II at least high grade serous ovarian cancer and abdominal pain.  Angeles Gaetana Michaelis, MD

## 2021-05-27 ENCOUNTER — Telehealth: Payer: Self-pay

## 2021-05-27 LAB — CA 125: Cancer Antigen (CA) 125: 9.2 U/mL (ref 0.0–38.1)

## 2021-05-27 NOTE — Telephone Encounter (Signed)
-----   Message from Verlon Au, NP sent at 05/27/2021 12:31 PM EST ----- Please let patient know that ca 125 remains low and normal. CT scan as planned. Thanks! Lauren   ----- Message ----- From: Buel Ream, [facility] [place] Sent: 05/27/2021   5:37 AM EST To: Verlon Au, NP

## 2021-06-09 ENCOUNTER — Ambulatory Visit (HOSPITAL_COMMUNITY): Payer: BC Managed Care – PPO

## 2021-06-09 ENCOUNTER — Telehealth: Payer: Self-pay

## 2021-06-09 NOTE — Telephone Encounter (Signed)
Pet scan cancelled by patient per documentation. Called Ms. Cedotal. Called and left voicemail with Ms. Williams to follow up. ?

## 2021-06-10 ENCOUNTER — Ambulatory Visit (HOSPITAL_COMMUNITY): Payer: BC Managed Care – PPO

## 2021-06-29 ENCOUNTER — Other Ambulatory Visit: Payer: Self-pay

## 2021-06-29 ENCOUNTER — Ambulatory Visit: Payer: BC Managed Care – PPO | Admitting: Internal Medicine

## 2021-06-29 ENCOUNTER — Encounter: Payer: Self-pay | Admitting: Internal Medicine

## 2021-06-29 DIAGNOSIS — B029 Zoster without complications: Secondary | ICD-10-CM | POA: Diagnosis not present

## 2021-06-29 MED ORDER — VALACYCLOVIR HCL 1 G PO TABS: 1000.0000 mg | ORAL_TABLET | Freq: Three times a day (TID) | ORAL | 1 refills | Status: DC

## 2021-06-29 NOTE — Assessment & Plan Note (Signed)
Clear looking rash but in V2 on right ?Discussed isolation  ?Will give valacyclovir 1gm tid x 7 days ?

## 2021-06-29 NOTE — Progress Notes (Signed)
? ?Subjective:  ? ? Patient ID: Debbie Richardson, female    DOB: Jun 01, 1960, 61 y.o.   MRN: [id number] ? ?HPI ?Here due to facial rash ? ?Started with rash 2 mornings ago ?No known bite ?Was out of town at friend's house---woke there with the rash ? ?No exposures that she knows of ?Tried some cortisone and benedryl creams---not really helpful ?Did seem worse yesterday ? ?Tired alcohol--almost blistery ?Tried lotrimin in case it was fungal ---??helped some ? ?It is itchy and has tingly sensation ? ?Current Outpatient Medications on File Prior to Visit  ?Medication Sig Dispense Refill  ? acetaminophen (TYLENOL) 500 MG tablet Take 2 tablets (1,000 mg total) by mouth every 6 (six) hours. 30 tablet 0  ? ?No current facility-administered medications on file prior to visit.  ? ? ?Allergies  ?Allergen Reactions  ? Ciprofloxacin Anaphylaxis  ? Paclitaxel Shortness Of Breath and Other (See Comments)  ?  Other reaction(s): Tight chest (finding) ?Chest and facial flushing ?Flushing, chest tightness, SOB w/ Taxol on 05/26/14 at [place].  ? Flagyl [Metronidazole] Other (See Comments)  ?  headache  ? Nutmeg Oil (Myristica Oil) Other (See Comments)  ?  Tightness in chest and throat and difficulty swallowing and breathing.  ? ? ?Past Medical History:  ?Diagnosis Date  ? Anxiety   ? Arthritis   ? Benign fundic gland polyps of stomach   ? Dizziness   ? vertigo  ? Gallstones   ? GERD (gastroesophageal reflux disease)   ? Ovarian cancer ([place]) 2016  ? Personal history of chemotherapy   ? ? ?Past Surgical History:  ?Procedure Laterality Date  ? CESAREAN SECTION    ? CHOLECYSTECTOMY    ? COLONOSCOPY    ? COLONOSCOPY WITH PROPOFOL N/A 04/29/2021  ? Procedure: COLONOSCOPY WITH PROPOFOL;  Surgeon: [name], MD;  Location: [facility] ENDOSCOPY;  Service: Gastroenterology;  Laterality: N/A;  ? DIAGNOSTIC LAPAROSCOPY    ? LAPAROSCOPIC HYSTERECTOMY N/A 10/29/2014  ? Procedure: HYSTERECTOMY TOTAL LAPAROSCOPIC, OMENTUMECTOMY;  Surgeon: Gillis Ends, MD;  Location: [facility] ORS;  Service: Gynecology;  Laterality: N/A;  ? LAPAROSCOPIC OOPHERECTOMY    ? OOPHORECTOMY    ? PORTA CATH REMOVAL N/A 04/26/2018  ? Procedure: PORTA CATH REMOVAL;  Surgeon: Algernon Huxley, MD;  Location: [place] [facility] LAB;  Service: Cardiovascular;  Laterality: N/A;  ? UPPER GI ENDOSCOPY    ? ? ?Family History  ?Problem Relation Age of Onset  ? Hypertension Mother   ?     77yo   ? Hyperlipidemia Mother   ? GER disease Mother   ? Heart disease Father   ?     78yo  ? Breast cancer Sister 44  ? Heart disease Paternal Grandmother   ? Breast cancer Paternal Grandmother 70  ?     deceased 30  ? Kidney disease Neg Hx   ? Liver disease Neg Hx   ? Colon cancer Neg Hx   ? Colon polyps Neg Hx   ? Esophageal cancer Neg Hx   ? Pancreatic cancer Neg Hx   ? ? ?Social History  ? ?Socioeconomic History  ? Marital status: Divorced  ?  Spouse name: Not on file  ? Number of children: 1  ? Years of education: Not on file  ? Highest education level: Not on file  ?Occupational History  ? Not on file  ?Tobacco Use  ? Smoking status: Former  ?  Types: Cigarettes  ?  Quit date: 04/05/1991  ?  Years since quitting: 30.2  ? Smokeless tobacco: Never  ?Vaping Use  ? Vaping Use: Never used  ?Substance and Sexual Activity  ? Alcohol use: Yes  ?  Alcohol/week: 0.0 standard drinks  ?  Comment: 1 drink of wine daily  ? Drug use: No  ? Sexual activity: Yes  ?Other Topics Concern  ? Not on file  ?Social History Narrative  ? Not on file  ? ?Social Determinants of Health  ? ?Financial Resource Strain: Not on file  ?Food Insecurity: Not on file  ?Transportation Needs: Not on file  ?Physical Activity: Not on file  ?Stress: Not on file  ?Social Connections: Not on file  ?Intimate Partner Violence: Not on file  ? ?Review of Systems ?No fever ?No other rash ? ?   ?Objective:  ? Physical Exam ?Constitutional:   ?   Appearance: Normal appearance.  ?Skin: ?   Comments: Papulovesicular clump on right cheek ?Tender very small  node at angle of right jaw  ?Neurological:  ?   Mental Status: She is alert.  ?  ? ? ? ? ?   ?Assessment & Plan:  ? ?

## 2021-07-06 ENCOUNTER — Ambulatory Visit: Payer: BC Managed Care – PPO | Admitting: Gastroenterology

## 2021-08-09 ENCOUNTER — Telehealth: Payer: BC Managed Care – PPO | Admitting: Gastroenterology

## 2021-08-16 ENCOUNTER — Telehealth: Payer: BC Managed Care – PPO | Admitting: Gastroenterology

## 2021-10-13 ENCOUNTER — Telehealth (INDEPENDENT_AMBULATORY_CARE_PROVIDER_SITE_OTHER): Payer: BC Managed Care – PPO | Admitting: Gastroenterology

## 2021-10-13 DIAGNOSIS — K580 Irritable bowel syndrome with diarrhea: Secondary | ICD-10-CM

## 2021-10-13 NOTE — Progress Notes (Signed)
Debbie Richardson , MD [address]  [place]  [place], [place] [address]  Main: [phone]  Fax: [phone]   Primary Care Physician: Tower, [name], MD  Virtual Visit via Video Note  I connected with patient on 10/13/21 at  3:00 PM EDT by video and verified that I am speaking with the correct person using two identifiers.   I discussed the limitations, risks, security and privacy concerns of performing an evaluation and management service by video  and the availability of in person appointments. I also discussed with the patient that there may be a patient responsible charge related to this service. The patient expressed understanding and agreed to proceed.  Location of Patient: Home Location of Provider: Home Persons involved: Patient and provider only   History of Present Illness: Chief Complaint  Patient presents with   Bloated    HPI: Debbie Richardson is a 61 y.o. 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  Successfully treated with repeat  courses of antibiotics with past response to Xifaxan.   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 Undergone BSO in 2016  .  02/27/17- Ct abdomen - diverticulosis of the colon , low stool burden.    Colonoscopy in 04/2014 showed diverticulosis and internal hemorroids. EGD 04/2014 showed - small gastric polyps(fundic gland polyps) otherwise normal.I treated her empirically with doxycycline for 2 weeks , I felt that the adhesions from surgery were likely contributing to stasis and small bowel bacterial overgrowth. Completed 2 rounds of doxycycline.Recurrence of bloating symptoms in October 2019 given a course of Augmentin which helped partially but did not resolve the issue.  In December 2019 treated with Xifaxan provided samples.  Another round of treatment in June 2020 with Xifaxan.   02/08/2021 treated IBS diarrhea with 14 days  of Xifaxan due to recurrence of symptoms 03/28/2021 ER visit for left lower quadrant pain she underwent a CT scan of the abdomen in the ER that showed mild wall thickening with adjacent stranding involving the descending and sigmoid colon reflecting mild colitis..  Labs showed a normal lipase, C BC and CMP.  Treated with Zofran and Bentyl and discharged home.  04/29/2021: colonoscopy : 2 sessile polyps resected tubular adenomas ,  : mucosa of colon appeared normal. Random colon and TI biopsies were normal      Interval history 05/17/2021-10/13/2021   05/2021: CT abdomen showed Mild wall thickening with adjacent stranding involving the descending and sigmoid colon which may reflect mild colitis.   States she has bloating on and off.  Has not required charcoal tablets.  She does not think requires antibiotics at this point of time.  No diarrhea.  Has been taking some NSAIDs.  Advised her to stop it.  All began after an episode of shingles recently.       Current Outpatient Medications  Medication Sig Dispense Refill   acetaminophen (TYLENOL) 500 MG tablet Take 2 tablets (1,000 mg total) by mouth every 6 (six) hours. 30 tablet 0   valACYclovir (VALTREX) 1000 MG tablet Take 1 tablet (1,000 mg total) by mouth 3 (three) times daily. 21 tablet 1   No current facility-administered medications for this visit.    Allergies as of 10/13/2021 - Review Complete 06/29/2021  Allergen Reaction Noted   Ciprofloxacin Anaphylaxis 08/19/2013   Paclitaxel Shortness Of Breath and Other (See Comments) 06/27/2014  Flagyl [metronidazole] Other (See Comments) 06/22/2015   Nutmeg oil (myristica oil) Other (See Comments) 02/27/2019    Review of Systems:    All systems reviewed and negative except where noted in HPI.  General Appearance:    Alert, cooperative, no distress, appears stated age  Head:    Normocephalic, without obvious abnormality, atraumatic  Eyes:    PERRL, conjunctiva/corneas clear,  Ears:     Grossly normal hearing    Neurologic:  Grossly normal    Observations/Objective:  Labs: CMP     Component Value Date/Time   NA 134 (L) 03/28/2021 1439   NA 135 07/14/2014 0955   K 3.4 (L) 03/28/2021 1439   K 3.2 (L) 07/21/2014 1518   CL 104 03/28/2021 1439   CL 106 07/14/2014 0955   CO2 23 03/28/2021 1439   CO2 21 (L) 07/14/2014 0955   GLUCOSE 96 03/28/2021 1439   GLUCOSE 138 (H) 07/14/2014 0955   BUN 13 03/28/2021 1439   BUN 10 07/14/2014 0955   CREATININE 0.73 03/28/2021 1439   CREATININE 0.60 07/14/2014 0955   CALCIUM 8.9 03/28/2021 1439   CALCIUM 9.1 07/14/2014 0955   PROT 7.5 03/28/2021 1439   PROT 7.1 07/14/2014 0955   ALBUMIN 4.6 03/28/2021 1439   ALBUMIN 4.4 07/14/2014 0955   AST 25 03/28/2021 1439   AST 24 07/14/2014 0955   ALT 22 03/28/2021 1439   ALT 20 07/14/2014 0955   ALKPHOS 43 03/28/2021 1439   ALKPHOS 45 07/14/2014 0955   BILITOT 1.0 03/28/2021 1439   BILITOT 0.7 07/14/2014 0955   GFRNONAA >60 03/28/2021 1439   GFRNONAA >60 07/14/2014 0955   GFRAA >60 06/13/2018 1010   GFRAA >60 07/14/2014 0955   Lab Results  Component Value Date   WBC 5.0 03/28/2021   HGB 13.0 03/28/2021   HCT 38.0 03/28/2021   MCV 93.6 03/28/2021   PLT 235 03/28/2021    Imaging Studies: No results found.  Assessment and Plan:   Debbie Richardson is a 61 y.o. y/o female who has had multiple courses of antibiotics for IBS diarrhea.  Likely underlying adhesions from scar tissue might have contributed to the same.  Recent course of ER visit which showed left-sided colitis. Colonoscopy in 04/2021 shows normal biopsies of the colon mucosa and TI biopsies.  Presently having symptoms of bloating on and off.  Not significantly significant at this point of time that it requires antibiotics.  She has taken NSAIDs recently.  Advised her to stop it.  She would like to see a nutrition specialist I said we will refer her to 1.  Advised to use charcoal tablets as needed if symptoms get  severe to give me a call and we will give her a course of Xifaxan     I discussed the assessment and treatment plan with the patient. The patient was provided an opportunity to ask questions and all were answered. The patient agreed with the plan and demonstrated an understanding of the instructions.   The patient was advised to call back or seek an in-person evaluation if the symptoms worsen or if the condition fails to improve as anticipated.  I provided 15 minutes of face-to-face time during this encounter.  Dr Debbie Bellows MD,MRCP [facility]) Gastroenterology/Hepatology Pager: [phone]   Speech recognition software was used to dictate this note.

## 2021-11-15 ENCOUNTER — Other Ambulatory Visit: Payer: Self-pay | Admitting: Family Medicine

## 2021-11-15 ENCOUNTER — Inpatient Hospital Stay: Payer: BC Managed Care – PPO | Attending: Obstetrics and Gynecology

## 2021-11-15 DIAGNOSIS — Z9221 Personal history of antineoplastic chemotherapy: Secondary | ICD-10-CM | POA: Diagnosis not present

## 2021-11-15 DIAGNOSIS — Z90710 Acquired absence of both cervix and uterus: Secondary | ICD-10-CM | POA: Insufficient documentation

## 2021-11-15 DIAGNOSIS — Z8543 Personal history of malignant neoplasm of ovary: Secondary | ICD-10-CM | POA: Insufficient documentation

## 2021-11-15 DIAGNOSIS — Z1231 Encounter for screening mammogram for malignant neoplasm of breast: Secondary | ICD-10-CM

## 2021-11-15 DIAGNOSIS — Z90722 Acquired absence of ovaries, bilateral: Secondary | ICD-10-CM | POA: Insufficient documentation

## 2021-11-16 LAB — CA 125: Cancer Antigen (CA) 125: 8.6 U/mL (ref 0.0–38.1)

## 2021-11-17 ENCOUNTER — Inpatient Hospital Stay (HOSPITAL_BASED_OUTPATIENT_CLINIC_OR_DEPARTMENT_OTHER): Payer: BC Managed Care – PPO | Admitting: Obstetrics and Gynecology

## 2021-11-17 VITALS — BP 114/87 | HR 90 | Temp 98.8°F | Resp 20 | Wt 134.7 lb

## 2021-11-17 DIAGNOSIS — Z8543 Personal history of malignant neoplasm of ovary: Secondary | ICD-10-CM | POA: Diagnosis not present

## 2021-11-17 DIAGNOSIS — Z9221 Personal history of antineoplastic chemotherapy: Secondary | ICD-10-CM | POA: Diagnosis not present

## 2021-11-17 DIAGNOSIS — Z90722 Acquired absence of ovaries, bilateral: Secondary | ICD-10-CM | POA: Diagnosis not present

## 2021-11-17 DIAGNOSIS — Z08 Encounter for follow-up examination after completed treatment for malignant neoplasm: Secondary | ICD-10-CM

## 2021-11-17 DIAGNOSIS — Z90710 Acquired absence of both cervix and uterus: Secondary | ICD-10-CM | POA: Diagnosis not present

## 2021-11-17 NOTE — Progress Notes (Signed)
Gynecologic Oncology Interval Note  Referring Provider: Dr. 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 She is on control arm of GOG225 and completed the active portion of the study on 03/15/17.   She was last seen in February 2023 for worsening GI symptoms. Had reassuring workup and returns today for continued surveillance. In interim, she has seen GI for IBS with diarrhea and has previously received xifaxan. She generally feels well. Has chronic IBS symptoms which are unchanged. She denies  complaints today. She presents today for continued surveillance.    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 See prior notes for complete details. She was taken emergently to the operating room on 04/28/2014 by Dr. Glennon Mac and a laparoscopic procedure was performed.  Bilateral salpingo-oophorectomy with  placement of the cystic lesions in the Endo Catch bag and removal through the LLQ port. Per the operative note the right ovary did rupture and contents of the cysts were spilled into the pelvis.  The final pathology revealed high-grade serous adenocarcinoma involving both ovaries and fallopian tubes. Washings also revealed clusters of highly atypical glandular cells and surface involvement of the ovary was present. Staging was felt to be stage II at least high grade serous ovarian cancer. Preoperative CA-125 was 45.   She received 6 cycles of carboplatin/taxane chemotherapy via an IV port, last treatment June 2016.  She had an allergy to taxol and was switched to taxotere for the last 4 cycles. She underwent interval TLH/omentectomy on 10/29/2014 for  ovarian cancer. Her final pathology was negative. She enrolled on GOG225.   CT scan 01/09/2015 negative  CT scan 06/24/2015 IMPRESSION: No evidence of urolithiasis, hydronephrosis, or other acute Findings.  Colonic diverticulosis. No radiographic evidence of diverticulitis.  CT scan of abdomen and pelvis 12/09/2015 IMPRESSION: Status post hysterectomy and bilateral salpingo-oophorectomy. No evidence of recurrent or metastatic disease  PET scan 09/19/2016 IMPRESSION: Negative PET-CT.  No findings for hypermetabolic recurrent tumor.  CT scan of abdomen and pelvis 02/27/2017  IMPRESSION: 1. No acute findings are noted in the abdomen or pelvis to account for the patient's symptoms. Notably, there is a low stool burden despite the patient's reported history of constipation. 2. Colonic diverticulosis without evidence of acute diverticulitis at this time. 3. Additional incidental findings, as above.   She had menopausal symptoms and we discussed Effexor therapy, but she did not pursue. She has used magnesium and felt that improved her symptoms.   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 Bailey Mech, GI. She has been on multiple antibiotics and Xifaxan with complete resolution of symptoms. But recurrent symptoms.    09/22/2017 CT scan A/P  IMPRESSION: 1. Uterus and ovaries absent. No pelvic mass. No adenopathy. No a mental lesions. No liver lesions evident. 2. No appreciable bowel wall thickening or bowel obstruction. There are occasional sigmoid diverticula without diverticulitis. No abscess in the abdomen or pelvis. Appendix appears normal. 3. No evident renal or ureteral calculus on either side. No hydronephrosis.   12/13/2017 She saw Dr. Rogue Bussing on  with a negative exam and CA 125 8.9.  03/06/2018  She saw Dr. Bailey Mech,  GI, who recommended Suggest trial of activated charcoal tablets and LOW FODMAP diet for Small  Intestinal Bacterial Overgrowth. Her symptoms have continued to worsen and she is really miserable.   03/14/2018.  Negative exam and normal Ca125 however, GI symptoms concerning for recurrence versus persistent small intestinal bacterial overgrowth syndrome. CT Abdomen Pelvis on 03/22/2018 showed no acute abdominal/pelvic findings, masses, or adenopathy. No worrisome peritoneal or omental implants.   Port was removed by Dr. Lucky Cowboy on 04/26/2018.    seen in July 2020 with negative exam and normal CA 125. GI symptoms at that time were improved. She has history of small intestinal bacterial overgrowth syndrome with previous negative imaging in December 2019.   03/28/2021 IMPRESSION: 1. Mild wall thickening with adjacent stranding involving the descending and sigmoid colon which may reflect mild colitis.  2. Prominence of the intra and extrahepatic biliary tree favored reservoir effect post cholecystectomy.  CA-125 results postchemotherapy 02/11/2015  10.4 03/12/2015  10.7 06/10/2015  10.0 03/15/2016   10.1 06/09/2016  9.9 09/14/2016  10.9 12/14/2016  9.8 02/17/17  12.1 03/20/2017  9.9 06/04/2017  11.4 09/13/2017  9.5 12/13/2017  8.9 03/14/2018 10.4 06/13/2018 9.2 09/10/2018 9.7 01/04/2019 10.2 05/06/2019 9.0 12/03/2019  10 09/11/20 8.6 05/26/21 9.2 11/15/21 8.6  Genetic testing- [employer] 83 gene Multi-Cancer Panel was negative for pathogenic mutation. She did have a VUS: SDHA c. 155C>T.   Screening mammogram - 12/13/2017 Screening Mammogram- Bi-rads category 1: negative   Problem List: Patient Active Problem List   Diagnosis Date Noted   Varicella zoster 06/29/2021   Viral URI with cough 02/12/2021   Axillary pain, left 12/30/2020   Orthopnea 12/30/2020   Breast tenderness 12/30/2020   Cough 12/30/2020   Chest wall pain 08/12/2020   Lipid screening 06/21/2019   History of anaphylaxis 06/21/2019   Screening mammogram, encounter for 01/07/2019   Encounter for follow-up surveillance of  ovarian cancer 10/03/2018   Bloating 08/02/2017   Menopause 09/14/2016   Neoplasm, ovary, malignant, unspecified laterality ([place]) 12/09/2015   Acute sinusitis 08/11/2015   Left flank pain 06/22/2015   Lower abdominal pain 11/26/2014   Status post laparoscopic hysterectomy 10/29/2014   Allergic conjunctivitis and rhinitis 07/04/2012   ETD (eustachian tube dysfunction) 07/02/2007   GERD (gastroesophageal reflux disease) 05/01/2007   PSORIASIS NEC 12/07/2006   Vertigo 08/25/2006    Past Medical History: Past Medical History:  Diagnosis Date   Anxiety    Arthritis    Benign fundic gland polyps of stomach    Dizziness    vertigo   Gallstones    GERD (gastroesophageal reflux disease)    Ovarian cancer ([place]) 2016   Personal history of chemotherapy     Past Surgical History: Past Surgical History:  Procedure Laterality Date   CESAREAN SECTION     CHOLECYSTECTOMY     COLONOSCOPY     COLONOSCOPY WITH PROPOFOL N/A 04/29/2021   Procedure: COLONOSCOPY WITH PROPOFOL;  Surgeon: [name], MD;  Location: [facility] ENDOSCOPY;  Service: Gastroenterology;  Laterality: N/A;   DIAGNOSTIC LAPAROSCOPY     LAPAROSCOPIC HYSTERECTOMY N/A 10/29/2014   Procedure: HYSTERECTOMY TOTAL LAPAROSCOPIC, OMENTUMECTOMY;  Surgeon: Gillis Ends, MD;  Location: [facility] ORS;  Service: Gynecology;  Laterality: N/A;   LAPAROSCOPIC OOPHERECTOMY     OOPHORECTOMY     PORTA CATH REMOVAL N/A 04/26/2018   Procedure: PORTA CATH REMOVAL;  Surgeon: Algernon Huxley, MD;  Location: [place] [facility] LAB;  Service: Cardiovascular;  Laterality: N/A;   UPPER GI ENDOSCOPY  Family History: Family History  Problem Relation Age of Onset   Hypertension Mother        7yo    Hyperlipidemia Mother    GER disease Mother    Heart disease Father        48yo   Breast cancer Sister 34   Heart disease Paternal Grandmother    Breast cancer Paternal Grandmother 39       deceased 28   Kidney disease Neg Hx    Liver disease  Neg Hx    Colon cancer Neg Hx    Colon polyps Neg Hx    Esophageal cancer Neg Hx    Pancreatic cancer Neg Hx     Social History: Social History   Socioeconomic History   Marital status: Divorced    Spouse name: Not on file   Number of children: 1   Years of education: Not on file   Highest education level: Not on file  Occupational History   Not on file  Tobacco Use   Smoking status: Former    Types: Cigarettes    Quit date: 04/05/1991    Years since quitting: 30.6   Smokeless tobacco: Never  Vaping Use   Vaping Use: Never used  Substance and Sexual Activity   Alcohol use: Yes    Alcohol/week: 0.0 standard drinks of alcohol    Comment: 1 drink of wine daily   Drug use: No   Sexual activity: Yes  Other Topics Concern   Not on file  Social History Narrative   Not on file   Social Determinants of Health   Financial Resource Strain: Not on file  Food Insecurity: Not on file  Transportation Needs: Not on file  Physical Activity: Not on file  Stress: Not on file  Social Connections: Not on file  Intimate Partner Violence: Not on file    Allergies: Allergies  Allergen Reactions   Ciprofloxacin Anaphylaxis   Paclitaxel Shortness Of Breath and Other (See Comments)    Other reaction(s): Tight chest (finding) Chest and facial flushing Flushing, chest tightness, SOB w/ Taxol on 05/26/14 at [place].   Flagyl [Metronidazole] Other (See Comments)    headache   Nutmeg Oil (Myristica Oil) Other (See Comments)    Tightness in chest and throat and difficulty swallowing and breathing.    Current Medications: Current Outpatient Medications  Medication Sig Dispense Refill   acetaminophen (TYLENOL) 500 MG tablet Take 2 tablets (1,000 mg total) by mouth every 6 (six) hours. 30 tablet 0   valACYclovir (VALTREX) 1000 MG tablet Take 1 tablet (1,000 mg total) by mouth 3 (three) times daily. (Patient not taking: Reported on 11/17/2021) 21 tablet 1   No current  facility-administered medications for this visit.   Review of Systems General:  no complaints Skin: no complaints Eyes: no complaints HEENT: no complaints Breasts: no complaints Pulmonary: no complaints Cardiac: no complaints Gastrointestinal: abdominal distention, diarrhea, constipation Genitourinary/Sexual: no complaints Ob/Gyn: no complaints Musculoskeletal: no complaints Hematology: no complaints Neurologic/Psych: no complaints   Objective:   Vitals:   11/17/21 1427  BP: 114/87  Pulse: 90  Resp: 20  Temp: 98.8 F (37.1 C)  SpO2: 100%    Body mass index is 24.64 kg/m.  ECOG Performance Status: 0 - Asymptomatic  GENERAL: Patient is a well appearing female in no acute distress NODES:  Negative axillary, supraclavicular, inguinal lymph node survery LUNGS:  Clear to auscultation bilaterally.   HEART:  Regular rate and rhythm.  ABDOMEN:  Soft,  nontender.  No hernias, incisions well healed. No masses or ascites EXTREMITIES:  No peripheral edema. Atraumatic.  SKIN:  Clear with no obvious rashes or skin changes.  NEURO:  Nonfocal. Well oriented.  Appropriate affect.  Pelvic: chaperoned by CMA EGBUS: no lesions Cervix: surgically absent Vagina: no lesions, no discharge or bleeding Uterus: surgically absent Adnexa: no palpable masses Rectovaginal: deferred  Assessment:  Debbie Richardson is a 61 y.o. 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 No evidence of disease on CT or exam and CA125 normal in March 2019. Interval TLH/Omentectomy 7/16 negative for malignancy. Interval imaging significant for diverticular disease. Persistent pain worked up by GI and thought to be consistent with bacterial overgrowth syndrome and adhesive disease. Prior CT scans and PET all negative for recurrent disease. Port removed. Normal CA-125. NED on exam today.   Mild wall thickening with  adjacent stranding involving the descending and sigmoid colon which may reflect mild colitis.  Plan:   Problem List Items Addressed This Visit       Other   Encounter for follow-up surveillance of ovarian cancer - Primary   CA 125 remains normal and patient is asymptomatic. Continue annual surveillancw with ca 125.   Beckey Rutter, DNP, AGNP-C [place] at [facility] [phone] (clinic)  I personally had a face to face interaction and evaluated the patient jointly with the NP, Ms. Beckey Rutter.  I have reviewed her history and available records and have performed the key portions of the physical exam including abdominal exam, pelvic exam with my findings confirming those documented above by the APP.  I have discussed the case with the APP and the patient.  I agree with the above documentation, assessment and plan which was fully formulated by me.  Counseling was completed by me.   I personally saw the patient and performed a substantive portion of this encounter in conjunction with the listed APP as documented above.  She is doing very well and plan for follow up in one year.    Jim Philemon Gaetana Michaelis, MD

## 2021-11-22 ENCOUNTER — Ambulatory Visit
Admission: RE | Admit: 2021-11-22 | Discharge: 2021-11-22 | Disposition: A | Discharge status: Home / Self Care | Payer: BC Managed Care – PPO | Source: Ambulatory Visit | Attending: Family Medicine | Admitting: Family Medicine

## 2021-11-22 DIAGNOSIS — Z1231 Encounter for screening mammogram for malignant neoplasm of breast: Secondary | ICD-10-CM | POA: Diagnosis not present

## 2022-01-25 ENCOUNTER — Encounter: Payer: Self-pay | Admitting: Family Medicine

## 2022-01-25 ENCOUNTER — Telehealth (INDEPENDENT_AMBULATORY_CARE_PROVIDER_SITE_OTHER): Payer: BC Managed Care – PPO | Admitting: Family Medicine

## 2022-01-25 DIAGNOSIS — J0100 Acute maxillary sinusitis, unspecified: Secondary | ICD-10-CM

## 2022-01-25 MED ORDER — PREDNISONE 10 MG PO TABS: ORAL_TABLET | ORAL | 0 refills | Status: DC

## 2022-01-25 MED ORDER — AMOXICILLIN-POT CLAVULANATE 875-125 MG PO TABS: 1.0000 | ORAL_TABLET | Freq: Two times a day (BID) | ORAL | 0 refills | Status: DC

## 2022-01-25 NOTE — Patient Instructions (Signed)
Drink fluids  Continue sinus rinse  Mucinex may help also   Take augmentin as directed and prednisone (may make you feel hyper and hungry)   Update if not starting to improve in a week or if worsening  If any severe symptoms please go to the ER  I do recommend the shingrix vaccine in the future If you are interested in the shingles vaccine series (Shingrix), call your insurance or pharmacy to check on coverage and location it must be given.  If affordable - you can schedule it here or at your pharmacy depending on coverage

## 2022-01-25 NOTE — Progress Notes (Signed)
Virtual Visit via Video Note  I connected with Debbie Richardson on 01/25/22 at 12:00 PM EDT by a video enabled telemedicine application and verified that I am speaking with the correct person using two identifiers.  Location: Patient: home Provider: office    I discussed the limitations of evaluation and management by telemedicine and the availability of in person appointments. The patient expressed understanding and agreed to proceed.  Parties involved in encounter  Patient: Debbie Richardson   Provider:  Loura Pardon MD   History of Present Illness: Pt presents with sinus problems  Also interested in shingrix vaccine (had shingles in the spring)  At least 3 weeks   (allergies in a dusty area  Sinus pressure and pain  Esp R side/eye  Feels like face is swollen Teeth hurt  Mucous has blood in it , a little discolored   Some low grade temp but no significant fever  Covid test was negative times 2   Some mild cough from drainage   Had a dental visit recently and no dental problems to cause her symptoms   Had shingles on that side in the spring  Wonders if nerves are overactive   Does sinus rinses  Flonase  Tylenol    Patient Active Problem List   Diagnosis Date Noted   Varicella zoster 06/29/2021   Viral URI with cough 02/12/2021   Axillary pain, left 12/30/2020   Orthopnea 12/30/2020   Breast tenderness 12/30/2020   Cough 12/30/2020   Chest wall pain 08/12/2020   Lipid screening 06/21/2019   History of anaphylaxis 06/21/2019   Screening mammogram, encounter for 01/07/2019   Encounter for follow-up surveillance of ovarian cancer 10/03/2018   Bloating 08/02/2017   Menopause 09/14/2016   Neoplasm, ovary, malignant, 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   Acute sinusitis 08/11/2015   Left flank pain 06/22/2015   Lower abdominal pain 11/26/2014   Status post laparoscopic hysterectomy 10/29/2014   Allergic conjunctivitis and rhinitis 07/04/2012   ETD (eustachian  tube dysfunction) 07/02/2007   GERD (gastroesophageal reflux disease) 05/01/2007   PSORIASIS NEC 12/07/2006   Vertigo 08/25/2006   Past Medical History:  Diagnosis Date   Anxiety    Arthritis    Benign fundic gland polyps of stomach    Dizziness    vertigo   Gallstones    GERD (gastroesophageal reflux disease)    Ovarian cancer ([place]) 2016   Personal history of chemotherapy    Past Surgical History:  Procedure Laterality Date   CESAREAN SECTION     CHOLECYSTECTOMY     COLONOSCOPY     COLONOSCOPY WITH PROPOFOL N/A 04/29/2021   Procedure: COLONOSCOPY WITH PROPOFOL;  Surgeon: [name], MD;  Location: [facility] ENDOSCOPY;  Service: Gastroenterology;  Laterality: N/A;   DIAGNOSTIC LAPAROSCOPY     LAPAROSCOPIC HYSTERECTOMY N/A 10/29/2014   Procedure: HYSTERECTOMY TOTAL LAPAROSCOPIC, OMENTUMECTOMY;  Surgeon: Gillis Ends, MD;  Location: [facility] ORS;  Service: Gynecology;  Laterality: N/A;   LAPAROSCOPIC OOPHERECTOMY     OOPHORECTOMY     PORTA CATH REMOVAL N/A 04/26/2018   Procedure: PORTA CATH REMOVAL;  Surgeon: Algernon Huxley, MD;  Location: [place] [facility] LAB;  Service: Cardiovascular;  Laterality: N/A;   UPPER GI ENDOSCOPY     Social History   Tobacco Use   Smoking status: Former    Types: Cigarettes    Quit date: 04/05/1991    Years since quitting: 30.8   Smokeless tobacco: Never  Vaping Use   Vaping Use: Never used  Substance Use Topics   Alcohol use: Yes    Alcohol/week: 0.0 standard drinks of alcohol    Comment: 1 drink of wine daily   Drug use: No   Family History  Problem Relation Age of Onset   Hypertension Mother        58yo    Hyperlipidemia Mother    GER disease Mother    Heart disease Father        70yo   Breast cancer Sister 73   Heart disease Paternal Grandmother    Breast cancer Paternal Grandmother 35       deceased 96   Kidney disease Neg Hx    Liver disease Neg Hx    Colon cancer Neg Hx    Colon polyps Neg Hx    Esophageal cancer Neg Hx     Pancreatic cancer Neg Hx    Allergies  Allergen Reactions   Ciprofloxacin Anaphylaxis   Paclitaxel Shortness Of Breath and Other (See Comments)    Other reaction(s): Tight chest (finding) Chest and facial flushing Flushing, chest tightness, SOB w/ Taxol on 05/26/14 at [place].   Flagyl [Metronidazole] Other (See Comments)    headache   Nutmeg Oil (Myristica Oil) Other (See Comments)    Tightness in chest and throat and difficulty swallowing and breathing.   No current outpatient medications on file prior to visit.   No current facility-administered medications on file prior to visit.   rule out  Observations/Objective: Patient appears well, in no distress Weight is baseline  No facial swelling or asymmetry Pt does have tenderness in R frontal and maxillary areas on self palp Normal voice-not hoarse and no slurred speech No obvious tremor or mobility impairment Moving neck and UEs normally Able to hear the call well  No cough or shortness of breath during interview  Talkative and mentally sharp with no cognitive changes No skin changes on face or neck , no rash or pallor Affect is normal    Assessment and Plan: Problem List Items Addressed This Visit       Respiratory   Acute sinusitis    3 weeks after bout of allergies Mostly r sided (did have reassuring dental eval) Home covid test times 2 negative  Disc symptom care incl expectorant and saline rinse  Px augmentin and prednisone taper 30 mg  Update if not starting to improve in a week or if worsening    ER precautions noted (if facial swelling/worse pain)       Relevant Medications   amoxicillin-clavulanate (AUGMENTIN) 875-125 MG tablet   predniSONE (DELTASONE) 10 MG tablet     Follow Up Instructions: Drink fluids  Continue sinus rinse  Mucinex may help also   Take augmentin as directed and prednisone (may make you feel hyper and hungry)   Update if not starting to improve in a week or if  worsening  If any severe symptoms please go to the ER  I do recommend the shingrix vaccine in the future If you are interested in the shingles vaccine series (Shingrix), call your insurance or pharmacy to check on coverage and location it must be given.  If affordable - you can schedule it here or at your pharmacy depending on coverage    I discussed the assessment and treatment plan with the patient. The patient was provided an opportunity to ask questions and all were answered. The patient agreed with the plan and demonstrated an understanding of the instructions.   The patient was  advised to call back or seek an in-person evaluation if the symptoms worsen or if the condition fails to improve as anticipated.     Loura Pardon, MD

## 2022-01-25 NOTE — Assessment & Plan Note (Signed)
3 weeks after bout of allergies Mostly r sided (did have reassuring dental eval) Home covid test times 2 negative  Disc symptom care incl expectorant and saline rinse  Px augmentin and prednisone taper 30 mg  Update if not starting to improve in a week or if worsening    ER precautions noted (if facial swelling/worse pain)

## 2022-03-31 ENCOUNTER — Encounter: Payer: Self-pay | Admitting: Family Medicine

## 2022-03-31 ENCOUNTER — Telehealth (INDEPENDENT_AMBULATORY_CARE_PROVIDER_SITE_OTHER): Payer: BC Managed Care – PPO | Admitting: Family Medicine

## 2022-03-31 VITALS — Temp 97.0°F | Wt 137.0 lb

## 2022-03-31 DIAGNOSIS — H0015 Chalazion left lower eyelid: Secondary | ICD-10-CM | POA: Diagnosis not present

## 2022-03-31 MED ORDER — AMOXICILLIN-POT CLAVULANATE 875-125 MG PO TABS: 1.0000 | ORAL_TABLET | Freq: Two times a day (BID) | ORAL | 0 refills | Status: DC

## 2022-03-31 NOTE — Assessment & Plan Note (Signed)
This appears to be more of a chalazion than hordeolum at this point but exam is limited by cameral quality  Some initial swelling/skin change under eye as well   Opt to tx with augmentin in case of poss pre septal cellulitis Continue warm compresses and monitor  Watch for eye pain/ eye movement pain/ vision change  Update if not starting to improve in a week or if worsening   If worse or not imp in a week ref to ophy

## 2022-03-31 NOTE — Progress Notes (Signed)
   Subjective:    Patient ID: Debbie Richardson, female    DOB: October 26, 1960, 61 y.o.   MRN: [id number]  HPI Pt presents with eye complaint   Started 2.5 weeks ago under her eye  Like a pimple , swollen pocket area  Used acne medicine  It released a bit/improved Never went away completely   Over the next week it started to feel like a stye , just inside the lid   No eye redness  Uses refresh tears  Warm compress helped the most   Now about the same  No pain , just pressure and aware it is there  Minimal redness  No pus but area appears white /pale   No pain to move eye but feels scratchy (the art tears help)  More irritated than itching  No vision change  No headache No sinus pain or swelling  No fever  Does not feel sick    Review of Systems     Objective:   Physical Exam        Assessment & Plan:

## 2022-03-31 NOTE — Patient Instructions (Signed)
Use warm compresses Take augmentin as planned   Watch for eye pain, especially with movement  Or redness or vision change If not improved in a week we will refer you to eye specialist

## 2022-03-31 NOTE — Progress Notes (Signed)
Virtual Visit via Video Note  I connected with Debbie Richardson on 03/31/22 at  8:00 AM EST by a video enabled telemedicine application and verified that I am speaking with the correct person using two identifiers.  Location: Patient: home Provider: office   I discussed the limitations of evaluation and management by telemedicine and the availability of in person appointments. The patient expressed understanding and agreed to proceed.  Parties involved in encounter  Patient: Debbie Richardson  Provider:  Loura Pardon MD   History of Present Illness: Started 2.5 weeks ago under her eye  Like a pimple , swollen pocket area  Used acne medicine  It released a bit/improved Never went away completely   Over the next week it started to feel like a stye , just inside the lid   No eye redness  Uses refresh tears  Warm compress helped the most   Now about the same  No pain , just pressure and aware it is there  Minimal redness  No pus but area appears white /pale   No pain to move eye but feels scratchy (the art tears help)  More irritated than itching  No vision change  No headache No sinus pain or swelling  No fever  Does not feel sick    Review of Systems  Constitutional:  Negative for chills, fever and malaise/fatigue.  HENT:  Negative for congestion, ear pain, sinus pain and sore throat.   Eyes:  Negative for blurred vision, double vision, photophobia, discharge and redness.       Eye lid discomfort  Dry eyes   Respiratory:  Negative for cough, shortness of breath and stridor.   Cardiovascular:  Negative for chest pain, palpitations and leg swelling.  Gastrointestinal:  Negative for abdominal pain, diarrhea, nausea and vomiting.  Musculoskeletal:  Negative for myalgias.  Skin:  Negative for rash.  Neurological:  Negative for dizziness and headaches.    Observations/Objective:  Patient appears well, in no distress Weight is baseline  No facial swelling or  asymmetry Some puffiness under L eye w/o erythema  Small chalazion noted when everting L lower lid (limited by camera quality)  Normal voice-not hoarse and no slurred speech No obvious tremor or mobility impairment Moving neck and UEs normally Able to hear the call well  No cough or shortness of breath during interview  Talkative and mentally sharp with no cognitive changes No skin changes on face or neck , no rash or pallor Affect is normal   Assessment and Plan: Problem List Items Addressed This Visit       Musculoskeletal and Integument   Chalazion left lower eyelid - Primary    This appears to be more of a chalazion than hordeolum at this point but exam is limited by cameral quality  Some initial swelling/skin change under eye as well   Opt to tx with augmentin in case of poss pre septal cellulitis Continue warm compresses and monitor  Watch for eye pain/ eye movement pain/ vision change  Update if not starting to improve in a week or if worsening   If worse or not imp in a week ref to ophy        Follow Up Instructions: Use warm compresses Take augmentin as planned   Watch for eye pain, especially with movement  Or redness or vision change If not improved in a week we will refer you to eye specialist    I discussed the assessment and treatment plan with the  patient. The patient was provided an opportunity to ask questions and all were answered. The patient agreed with the plan and demonstrated an understanding of the instructions.   The patient was advised to call back or seek an in-person evaluation if the symptoms worsen or if the condition fails to improve as anticipated.     Loura Pardon, MD

## 2022-04-25 ENCOUNTER — Encounter: Payer: Self-pay | Admitting: Family Medicine

## 2022-04-25 ENCOUNTER — Telehealth: Payer: Self-pay | Admitting: Family Medicine

## 2022-04-25 DIAGNOSIS — Z0000 Encounter for general adult medical examination without abnormal findings: Secondary | ICD-10-CM

## 2022-04-25 HISTORY — DX: Encounter for general adult medical examination without abnormal findings: Z00.00

## 2022-04-25 NOTE — Telephone Encounter (Signed)
-----  Message from Velna Hatchet, RT sent at 04/19/2022  8:35 AM EST ----- Regarding: Wed 1/24 lab Patient is scheduled for cpx, please order future labs.  Thanks, Anda Kraft

## 2022-04-27 ENCOUNTER — Other Ambulatory Visit: Payer: BC Managed Care – PPO

## 2022-05-03 ENCOUNTER — Other Ambulatory Visit (INDEPENDENT_AMBULATORY_CARE_PROVIDER_SITE_OTHER): Payer: BC Managed Care – PPO

## 2022-05-03 ENCOUNTER — Encounter: Payer: BC Managed Care – PPO | Admitting: Family Medicine

## 2022-05-03 DIAGNOSIS — Z0000 Encounter for general adult medical examination without abnormal findings: Secondary | ICD-10-CM | POA: Diagnosis not present

## 2022-05-03 LAB — COMPREHENSIVE METABOLIC PANEL
ALT: 13 U/L (ref 0–35)
AST: 15 U/L (ref 0–37)
Albumin: 4.6 g/dL (ref 3.5–5.2)
Alkaline Phosphatase: 43 U/L (ref 39–117)
BUN: 11 mg/dL (ref 6–23)
CO2: 26 mEq/L (ref 19–32)
Calcium: 9.1 mg/dL (ref 8.4–10.5)
Chloride: 106 mEq/L (ref 96–112)
Creatinine, Ser: 0.62 mg/dL (ref 0.40–1.20)
GFR: 95.64 mL/min (ref >60.00)
Glucose, Bld: 96 mg/dL (ref 70–99)
Potassium: 3.9 mEq/L (ref 3.5–5.1)
Sodium: 140 mEq/L (ref 135–145)
Total Bilirubin: 0.5 mg/dL (ref 0.2–1.2)
Total Protein: 6.6 g/dL (ref 6.0–8.3)

## 2022-05-03 LAB — CBC WITH DIFFERENTIAL/PLATELET
Basophils Absolute: 0 10*3/uL (ref 0.0–0.1)
Basophils Relative: 0.9 % (ref 0.0–3.0)
Eosinophils Absolute: 0 10*3/uL (ref 0.0–0.7)
Eosinophils Relative: 0.9 % (ref 0.0–5.0)
HCT: 37.6 % (ref 36.0–46.0)
Hemoglobin: 13.1 g/dL (ref 12.0–15.0)
Lymphocytes Relative: 46.6 % — ABNORMAL HIGH (ref 12.0–46.0)
Lymphs Abs: 1.5 10*3/uL (ref 0.7–4.0)
MCHC: 34.8 g/dL (ref 30.0–36.0)
MCV: 92.2 fl (ref 78.0–100.0)
Monocytes Absolute: 0.2 10*3/uL (ref 0.1–1.0)
Monocytes Relative: 7.3 % (ref 3.0–12.0)
Neutro Abs: 1.4 10*3/uL (ref 1.4–7.7)
Neutrophils Relative %: 44.3 % (ref 43.0–77.0)
Platelets: 190 10*3/uL (ref 150.0–400.0)
RBC: 4.08 Mil/uL (ref 3.87–5.11)
RDW: 13.4 % (ref 11.5–15.5)
WBC: 3.2 10*3/uL — ABNORMAL LOW (ref 4.0–10.5)

## 2022-05-03 LAB — LIPID PANEL
Cholesterol: 222 mg/dL — ABNORMAL HIGH (ref 0–200)
HDL: 80.1 mg/dL (ref >39.00)
LDL Cholesterol: 124 mg/dL — ABNORMAL HIGH (ref 0–99)
NonHDL: 141.83
Total CHOL/HDL Ratio: 3
Triglycerides: 87 mg/dL (ref 0.0–149.0)
VLDL: 17.4 mg/dL (ref 0.0–40.0)

## 2022-05-03 LAB — TSH: TSH: 2.24 u[IU]/mL (ref 0.35–5.50)

## 2022-05-04 ENCOUNTER — Telehealth: Payer: Self-pay | Admitting: Family Medicine

## 2022-05-04 ENCOUNTER — Encounter: Payer: Self-pay | Admitting: Family Medicine

## 2022-05-04 NOTE — Telephone Encounter (Signed)
Error

## 2022-05-09 DIAGNOSIS — H11122 Conjunctival concretions, left eye: Secondary | ICD-10-CM | POA: Diagnosis not present

## 2022-05-10 ENCOUNTER — Ambulatory Visit (INDEPENDENT_AMBULATORY_CARE_PROVIDER_SITE_OTHER): Payer: BC Managed Care – PPO | Admitting: Family Medicine

## 2022-05-10 ENCOUNTER — Encounter: Payer: Self-pay | Admitting: Family Medicine

## 2022-05-10 VITALS — BP 116/68 | HR 91 | Temp 98.0°F | Ht 62.5 in | Wt 136.4 lb

## 2022-05-10 DIAGNOSIS — H0015 Chalazion left lower eyelid: Secondary | ICD-10-CM | POA: Diagnosis not present

## 2022-05-10 DIAGNOSIS — K219 Gastro-esophageal reflux disease without esophagitis: Secondary | ICD-10-CM

## 2022-05-10 DIAGNOSIS — Z0000 Encounter for general adult medical examination without abnormal findings: Secondary | ICD-10-CM | POA: Diagnosis not present

## 2022-05-10 MED ORDER — PANTOPRAZOLE SODIUM 40 MG PO TBEC: 40.0000 mg | DELAYED_RELEASE_TABLET | Freq: Every day | ORAL | 3 refills | Status: DC | PRN

## 2022-05-10 NOTE — Assessment & Plan Note (Signed)
Was tx with augmentin  It cleared and flared again  Sees oph-who put her on a px drop (unsure what) and getting better

## 2022-05-10 NOTE — Progress Notes (Signed)
Subjective:    Patient ID: Debbie Richardson, female    DOB: [birth year], 62 y.o.   MRN: [id number]  HPI Here for health maintenance exam and to review chronic medical problems    Wt Readings from Last 3 Encounters:  05/10/22 136 lb 6 oz (61.9 kg)  03/31/22 137 lb (62.1 kg)  01/25/22 136 lb (61.7 kg)   24.55 kg/m  Vitals:   05/10/22 0802  BP: 116/68  Pulse: 91  Temp: 98 F (36.7 C)  SpO2: 98%    Had covid first week in January  Had a stye- tx with augmentin  Got better then it flared back up  Put her on a drop - eye doctor yesterday  Doing warm compresses    Immunization History  Administered Date(s) Administered   Influenza,inj,Quad PF,6+ Mos 05/25/2015   PFIZER(Purple Top)SARS-COV-2 Vaccination 07/12/2019, 02/07/2020   Td 04/04/1994   Tdap 08/19/2013   Health Maintenance Due  Topic Date Due   Zoster Vaccines- Shingrix (1 of 2) Never done   PAP SMEAR-Modifier  Never done   COVID-19 Vaccine (3 - Pfizer risk series) 03/06/2020    Shingrix-not ready to get today  May be later  Had shingles last march  Gyn care -has had a hysterectomy in setting of past ovarian cancer  No problems  Sees oncologist   Mammogram 11/2021 Self breast exam: no lumps / no new pain  Sister had breast cancer at 30 PGM had b cancer at 62   Colonoscopy 04/2021 with 7 y recall Sees Dr Vicente Males Has IBS  Adhesions bother her - uses a heating pad a lot  Some muscular back pain when she sits at her desk   Works at home most of the time  Not enough exercise  Needs to cut back her work hours  Planning retirement in 3 years   Has had less appetite since she got over covid  Not eating as well  [employer] for veggies recently - wants to start back with abalanced diet    Lab Results  Component Value Date   WBC 3.2 (L) 05/03/2022   HGB 13.1 05/03/2022   HCT 37.6 05/03/2022   MCV 92.2 05/03/2022   PLT 190.0 05/03/2022   Cholesterol Lab Results  Component Value Date   CHOL 222  (H) 05/03/2022   CHOL 220 (H) 06/21/2019   Lab Results  Component Value Date   HDL 80.10 05/03/2022   HDL 93.80 06/21/2019   Lab Results  Component Value Date   LDLCALC 124 (H) 05/03/2022   LDLCALC 116 (H) 06/21/2019   Lab Results  Component Value Date   TRIG 87.0 05/03/2022   TRIG 49.0 06/21/2019   Lab Results  Component Value Date   CHOLHDL 3 05/03/2022   CHOLHDL 2 06/21/2019   No results found for: "LDLDIRECT"  Eating healthy  Avoids fatty foods and red meat and fried foods   Tries to get more veggies but they bother her IBS  Lab Results  Component Value Date   CREATININE 0.62 05/03/2022   BUN 11 05/03/2022   NA 140 05/03/2022   K 3.9 05/03/2022   CL 106 05/03/2022   CO2 26 05/03/2022   Lab Results  Component Value Date   ALT 13 05/03/2022   AST 15 05/03/2022   ALKPHOS 43 05/03/2022   BILITOT 0.5 05/03/2022   Lab Results  Component Value Date   TSH 2.24 05/03/2022    Some heartburn on /off  Needs protonix for prn  Patient Active Problem List   Diagnosis Date Noted   Routine general medical examination at a health care facility 04/25/2022   Chalazion left lower eyelid 03/31/2022   Orthopnea 12/30/2020   Lipid screening 06/21/2019   History of anaphylaxis 06/21/2019   Screening mammogram, encounter for 01/07/2019   Encounter for follow-up surveillance of ovarian cancer 10/03/2018   Menopause 09/14/2016   Neoplasm, ovary, malignant, unspecified laterality ([place]) 12/09/2015   Status post laparoscopic hysterectomy 10/29/2014   GERD (gastroesophageal reflux disease) 05/01/2007   PSORIASIS NEC 12/07/2006   Past Medical History:  Diagnosis Date   Anxiety    Arthritis    Benign fundic gland polyps of stomach    Dizziness    vertigo   Gallstones    GERD (gastroesophageal reflux disease)    Ovarian cancer ([place]) 2016   Personal history of chemotherapy    Routine general medical examination at a health care facility 04/25/2022   Past Surgical  History:  Procedure Laterality Date   CESAREAN SECTION     CHOLECYSTECTOMY     COLONOSCOPY     COLONOSCOPY WITH PROPOFOL N/A 04/29/2021   Procedure: COLONOSCOPY WITH PROPOFOL;  Surgeon: [name], MD;  Location: [facility] ENDOSCOPY;  Service: Gastroenterology;  Laterality: N/A;   DIAGNOSTIC LAPAROSCOPY     LAPAROSCOPIC HYSTERECTOMY N/A 10/29/2014   Procedure: HYSTERECTOMY TOTAL LAPAROSCOPIC, OMENTUMECTOMY;  Surgeon: Gillis Ends, MD;  Location: [facility] ORS;  Service: Gynecology;  Laterality: N/A;   LAPAROSCOPIC OOPHERECTOMY     OOPHORECTOMY     PORTA CATH REMOVAL N/A 04/26/2018   Procedure: PORTA CATH REMOVAL;  Surgeon: Algernon Huxley, MD;  Location: [place] [facility] LAB;  Service: Cardiovascular;  Laterality: N/A;   UPPER GI ENDOSCOPY     Social History   Tobacco Use   Smoking status: Former    Types: Cigarettes    Quit date: 04/05/1991    Years since quitting: 31.1   Smokeless tobacco: Never  Vaping Use   Vaping Use: Never used  Substance Use Topics   Alcohol use: Yes    Alcohol/week: 0.0 standard drinks of alcohol    Comment: 1 drink of wine daily   Drug use: No   Family History  Problem Relation Age of Onset   Hypertension Mother        63yo    Hyperlipidemia Mother    GER disease Mother    Heart disease Father        11yo   Breast cancer Sister 97   Heart disease Paternal Grandmother    Breast cancer Paternal Grandmother 19       deceased 83   Kidney disease Neg Hx    Liver disease Neg Hx    Colon cancer Neg Hx    Colon polyps Neg Hx    Esophageal cancer Neg Hx    Pancreatic cancer Neg Hx    Allergies  Allergen Reactions   Ciprofloxacin Anaphylaxis   Paclitaxel Shortness Of Breath and Other (See Comments)    Other reaction(s): Tight chest (finding) Chest and facial flushing Flushing, chest tightness, SOB w/ Taxol on 05/26/14 at [place].   Flagyl [Metronidazole] Other (See Comments)    headache   Nutmeg Oil (Myristica Oil) Other (See Comments)     Tightness in chest and throat and difficulty swallowing and breathing.   No current outpatient medications on file prior to visit.   No current facility-administered medications on file prior to visit.    Review of Systems  Constitutional:  Negative for activity change, appetite change, fatigue, fever and unexpected weight change.  HENT:  Negative for congestion, ear pain, rhinorrhea, sinus pressure and sore throat.   Eyes:  Negative for pain, redness and visual disturbance.  Respiratory:  Negative for cough, shortness of breath and wheezing.   Cardiovascular:  Negative for chest pain and palpitations.  Gastrointestinal:  Negative for abdominal pain, blood in stool, constipation and diarrhea.  Endocrine: Negative for polydipsia and polyuria.  Genitourinary:  Negative for dysuria, frequency and urgency.  Musculoskeletal:  Positive for arthralgias. Negative for back pain and myalgias.  Skin:  Negative for pallor and rash.  Allergic/Immunologic: Negative for environmental allergies.  Neurological:  Negative for dizziness, syncope and headaches.  Hematological:  Negative for adenopathy. Does not bruise/bleed easily.  Psychiatric/Behavioral:  Negative for decreased concentration and dysphoric mood. The patient is not nervous/anxious.        Objective:   Physical Exam Constitutional:      General: She is not in acute distress.    Appearance: Normal appearance. She is well-developed and normal weight. She is not ill-appearing or diaphoretic.  HENT:     Head: Normocephalic and atraumatic.     Right Ear: Tympanic membrane, ear canal and external ear normal.     Left Ear: Tympanic membrane, ear canal and external ear normal.     Nose: Nose normal. No congestion.     Mouth/Throat:     Mouth: Mucous membranes are moist.     Pharynx: Oropharynx is clear. No posterior oropharyngeal erythema.  Eyes:     General: No scleral icterus.    Extraocular Movements: Extraocular movements intact.      Conjunctiva/sclera: Conjunctivae normal.     Pupils: Pupils are equal, round, and reactive to light.  Neck:     Thyroid: No thyromegaly.     Vascular: No carotid bruit or JVD.  Cardiovascular:     Rate and Rhythm: Normal rate and regular rhythm.     Pulses: Normal pulses.     Heart sounds: Normal heart sounds.     No gallop.  Pulmonary:     Effort: Pulmonary effort is normal. No respiratory distress.     Breath sounds: Normal breath sounds. No wheezing.     Comments: Good air exch Chest:     Chest wall: No tenderness.  Abdominal:     General: Bowel sounds are normal. There is no distension or abdominal bruit.     Palpations: Abdomen is soft. There is no mass.     Tenderness: There is no abdominal tenderness.     Hernia: No hernia is present.  Genitourinary:    Comments: Breast exam: No mass, nodules, thickening, tenderness, bulging, retraction, inflamation, nipple discharge or skin changes noted.  No axillary or clavicular LA.     Musculoskeletal:        General: No tenderness. Normal range of motion.     Cervical back: Normal range of motion and neck supple. No rigidity. No muscular tenderness.     Right lower leg: No edema.     Left lower leg: No edema.     Comments: No kyphosis   Lymphadenopathy:     Cervical: No cervical adenopathy.  Skin:    General: Skin is warm and dry.     Coloration: Skin is not pale.     Findings: No erythema or rash.     Comments: Solar lentigines diffusely   Neurological:     Mental Status: She is alert. Mental status  is at baseline.     Cranial Nerves: No cranial nerve deficit.     Motor: No abnormal muscle tone.     Coordination: Coordination normal.     Gait: Gait normal.     Deep Tendon Reflexes: Reflexes are normal and symmetric. Reflexes normal.  Psychiatric:        Mood and Affect: Mood normal.        Cognition and Memory: Cognition and memory normal.           Assessment & Plan:   Problem List Items Addressed This Visit        Digestive   GERD (gastroesophageal reflux disease)    Takes protonix prn (H2 does not work for her) This is not frequent Enc her to avoid foods that trigger her      Relevant Medications   pantoprazole (PROTONIX) 40 MG tablet     Musculoskeletal and Integument   Chalazion left lower eyelid    Was tx with augmentin  It cleared and flared again  Sees oph-who put her on a px drop (unsure what) and getting better         Other   Routine general medical examination at a health care facility - Primary    Reviewed health habits including diet and exercise and skin cancer prevention Reviewed appropriate screening tests for age  Also reviewed health mt list, fam hx and immunization status , as well as social and family history   See HPI Labs reviewed  Declines shingrix but may consider later  Mammogram utd 11/2021  Utd onc care for past ov cancer  Colonoscopy 04/2021 with 7 y recall  Disc goals for exercise/ with strength training  Strongly enc ca and D for bone health , consider dexa at 3 or early if any new risk factors

## 2022-05-10 NOTE — Assessment & Plan Note (Signed)
Takes protonix prn (H2 does not work for her) This is not frequent Enc her to avoid foods that trigger her

## 2022-05-10 NOTE — Patient Instructions (Addendum)
If you are interested in the shingles vaccine series (Shingrix), call your insurance or pharmacy to check on coverage and location it must be given.  If affordable - you can schedule it here or at your pharmacy depending on coverage   Think about what you want to do for exercise  Strength training is most important  Yoga or chair yoga is a good start   Try to eat a healthy balanced diet   Keep up a good fluid intake   For cholesterol Avoid red meat/ fried foods/ egg yolks/ fatty breakfast meats/ butter, cheese and high fat dairy/ and shellfish   Try to get 1200-1500 mg of calcium per day with at least 1000 iu of vitamin D - for bone health  Very important for your bones

## 2022-05-10 NOTE — Assessment & Plan Note (Addendum)
Reviewed health habits including diet and exercise and skin cancer prevention Reviewed appropriate screening tests for age  Also reviewed health mt list, fam hx and immunization status , as well as social and family history   See HPI Labs reviewed  Declines shingrix but may consider later  Mammogram utd 11/2021  Utd onc care for past ov cancer  Colonoscopy 04/2021 with 7 y recall  Disc goals for exercise/ with strength training  Strongly enc ca and D for bone health , consider dexa at 26 or early if any new risk factors

## 2022-05-26 ENCOUNTER — Encounter: Payer: Self-pay | Admitting: Licensed Clinical Social Worker

## 2022-05-26 DIAGNOSIS — Z1379 Encounter for other screening for genetic and chromosomal anomalies: Secondary | ICD-10-CM | POA: Insufficient documentation

## 2022-05-26 NOTE — Telephone Encounter (Signed)
UPDATE: VUS in [place] called [id number] has been reclassified to "Benign." The amended report date is 05/09/2022.

## 2022-06-08 ENCOUNTER — Telehealth: Payer: Self-pay

## 2022-06-08 ENCOUNTER — Other Ambulatory Visit (HOSPITAL_COMMUNITY): Payer: Self-pay

## 2022-06-08 NOTE — Telephone Encounter (Signed)
Pharmacy Patient Advocate Encounter  Received notification from [place] that prior authorization for Pantoprazole Sodium '40MG'$  dr tablets is required/requested.  Per Test Claim: PA required   PA submitted on 06/08/22 to (ins) [place] via CoverMyMeds Key or (Medicaid) confirmation # [id number] Status is pending

## 2022-06-13 ENCOUNTER — Other Ambulatory Visit (HOSPITAL_COMMUNITY): Payer: Self-pay

## 2022-06-13 NOTE — Telephone Encounter (Signed)
Patient Advocate Encounter  Prior Authorization for Pantoprazole Sodium '40MG'$  dr tablets has been approved.    PA#  [id number] Effective dates: 06/08/22 through 06/08/23  Pharmacy received paid claim

## 2022-08-15 ENCOUNTER — Other Ambulatory Visit: Payer: Self-pay | Admitting: Gastroenterology

## 2022-08-18 NOTE — Telephone Encounter (Signed)
Yes ok 

## 2022-08-22 ENCOUNTER — Other Ambulatory Visit: Payer: Self-pay

## 2022-08-22 MED ORDER — RIFAXIMIN 550 MG PO TABS: ORAL_TABLET | ORAL | 0 refills | Status: DC

## 2022-09-05 ENCOUNTER — Ambulatory Visit (INDEPENDENT_AMBULATORY_CARE_PROVIDER_SITE_OTHER): Payer: BC Managed Care – PPO | Admitting: Gastroenterology

## 2022-09-05 ENCOUNTER — Encounter: Payer: Self-pay | Admitting: Gastroenterology

## 2022-09-05 ENCOUNTER — Other Ambulatory Visit: Payer: Self-pay

## 2022-09-05 VITALS — BP 120/78 | HR 99 | Temp 98.1°F | Ht 62.0 in | Wt 141.4 lb

## 2022-09-05 DIAGNOSIS — K580 Irritable bowel syndrome with diarrhea: Secondary | ICD-10-CM | POA: Diagnosis not present

## 2022-09-05 NOTE — Progress Notes (Unsigned)
Wyline Mood MD, MRCP(U.K) [address]  [address]  [place], [place] [address]  Main: [phone]  Fax: [phone]   Primary Care Physician: Tower, Audrie Gallus, MD  Primary Gastroenterologist:  Dr. Wyline Mood   No chief complaint on file.   HPI: Debbie Richardson is a 62 y.o. female Summary of history :   I have seen her for the past few years for IBS diarrhea.  Successfully treated with repeat  courses of antibiotics with past response to Xifaxan.   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 Undergone BSO in 2016  .  02/27/17- Ct abdomen - diverticulosis of the colon , low stool burden.    Colonoscopy in 04/2014 showed diverticulosis and internal hemorroids. EGD 04/2014 showed - small gastric polyps(fundic gland polyps) otherwise normal.I treated her empirically with doxycycline for 2 weeks , I felt that the adhesions from surgery were likely contributing to stasis and small bowel bacterial overgrowth. Completed 2 rounds of doxycycline.Recurrence of bloating symptoms in October 2019 given a course of Augmentin which helped partially but did not resolve the issue.  In December 2019 treated with Xifaxan provided samples.  Another round of treatment in June 2020 with Xifaxan.   02/08/2021 treated IBS diarrhea with 14 days of Xifaxan due to recurrence of symptoms 03/28/2021 ER visit for left lower quadrant pain she underwent a CT scan of the abdomen in the ER that showed mild wall thickening with adjacent stranding involving the descending and sigmoid colon reflecting mild colitis..  Labs showed a normal lipase, C BC and CMP.  Treated with Zofran and Bentyl and discharged home.  04/29/2021: colonoscopy : 2 sessile polyps resected tubular adenomas ,  : mucosa of colon appeared normal. Random colon and TI biopsies were normal  05/2021: CT abdomen showed Mild wall thickening with adjacent stranding involving the descending and sigmoid  colon which may reflect mild colitis.     Interval history 10/13/2021-09/05/2022      States she has bloating on and off.  Has not required charcoal tablets.  She does not think requires antibiotics at this point of time.  No diarrhea.  Has been taking some NSAIDs.  Advised her to stop it.  All began after an episode of shingles recently. Current Outpatient Medications  Medication Sig Dispense Refill   pantoprazole (PROTONIX) 40 MG tablet Take 1 tablet (40 mg total) by mouth daily as needed. 30 tablet 3   rifaximin (XIFAXAN) 550 MG TABS tablet TAKE 1 TABLET (550 MG TOTAL) BY MOUTH 2 (TWO) TIMES DAILY FOR 14 DAYS. 28 tablet 0   No current facility-administered medications for this visit.    Allergies as of 09/05/2022 - Review Complete 05/10/2022  Allergen Reaction Noted   Ciprofloxacin Anaphylaxis 08/19/2013   Paclitaxel Shortness Of Breath and Other (See Comments) 06/27/2014   Flagyl [metronidazole] Other (See Comments) 06/22/2015   Nutmeg oil (myristica oil) Other (See Comments) 02/27/2019     ROS:  General: Negative for anorexia, weight loss, fever, chills, fatigue, weakness. ENT: Negative for hoarseness, difficulty swallowing , nasal congestion. CV: Negative for chest pain, angina, palpitations, dyspnea on exertion, peripheral edema.  Respiratory: Negative for dyspnea at rest, dyspnea on exertion, cough, sputum, wheezing.  GI: See history of present illness. GU:  Negative for dysuria, hematuria, urinary incontinence, urinary frequency, nocturnal urination.  Endo: Negative for unusual weight change.    Physical Examination:   LMP 03/30/2013   General: Well-nourished, well-developed  in no acute distress.  Eyes: No icterus. Conjunctivae pink. Mouth: Oropharyngeal mucosa moist and pink , no lesions erythema or exudate. Lungs: Clear to auscultation bilaterally. Non-labored. Heart: Regular rate and rhythm, no murmurs rubs or gallops.  Abdomen: Bowel sounds are normal, nontender,  nondistended, no hepatosplenomegaly or masses, no abdominal bruits or hernia , no rebound or guarding.   Extremities: No lower extremity edema. No clubbing or deformities. Neuro: Alert and oriented x 3.  Grossly intact. Skin: Warm and dry, no jaundice.   Psych: Alert and cooperative, normal mood and affect.   Imaging Studies: No results found.  Assessment and Plan:   Debbie Richardson is a 62 y.o. y/o female  who has had multiple courses of antibiotics for IBS diarrhea.  Likely underlying adhesions from scar tissue might have contributed to the same.  Recent course of ER visit which showed left-sided colitis. Colonoscopy in 04/2021 shows normal biopsies of the colon mucosa and TI biopsies.  Presently having symptoms of bloating on and off.  Not significantly significant at this point of time that it requires antibiotics.  She has taken NSAIDs recently.  Advised her to stop it.  She would like to see a nutrition specialist I said we will refer her to 1.  Advised to use charcoal tablets as needed if symptoms get severe to give me a call and we will give her a course of Xifaxan     Dr Wyline Mood  MD,MRCP [facility]) Follow up in ***  BP check ***

## 2022-09-21 ENCOUNTER — Encounter: Payer: Self-pay | Admitting: Family Medicine

## 2022-09-21 ENCOUNTER — Ambulatory Visit: Payer: BC Managed Care – PPO | Admitting: Family Medicine

## 2022-09-21 VITALS — BP 108/68 | HR 81 | Temp 97.9°F | Ht 62.0 in | Wt 138.1 lb

## 2022-09-21 DIAGNOSIS — R300 Dysuria: Secondary | ICD-10-CM | POA: Diagnosis not present

## 2022-09-21 DIAGNOSIS — K582 Mixed irritable bowel syndrome: Secondary | ICD-10-CM

## 2022-09-21 DIAGNOSIS — K589 Irritable bowel syndrome without diarrhea: Secondary | ICD-10-CM | POA: Insufficient documentation

## 2022-09-21 DIAGNOSIS — K644 Residual hemorrhoidal skin tags: Secondary | ICD-10-CM

## 2022-09-21 LAB — POCT UA - MICROSCOPIC ONLY
Bacteria, U Microscopic: 0
RBC, Urine, Miroscopic: 0 (ref 0–2)

## 2022-09-21 LAB — POC URINALSYSI DIPSTICK (AUTOMATED)
Bilirubin, UA: NEGATIVE
Blood, UA: NEGATIVE
Glucose, UA: NEGATIVE
Ketones, UA: NEGATIVE
Leukocytes, UA: NEGATIVE
Nitrite, UA: NEGATIVE
Protein, UA: NEGATIVE
Spec Grav, UA: 1.015 (ref 1.010–1.025)
Urobilinogen, UA: 0.2 E.U./dL
pH, UA: 6 (ref 5.0–8.0)

## 2022-09-21 MED ORDER — HYDROCORTISONE (PERIANAL) 2.5 % EX CREA: 1.0000 | TOPICAL_CREAM | Freq: Two times a day (BID) | CUTANEOUS | 0 refills | Status: AC

## 2022-09-21 NOTE — Patient Instructions (Addendum)
Urine is clear  We will contact you with culture result in several days  If symptoms worsen in the meantime let us know  Get a donut pillow to sit on  You can use a cool compress for itching and pain  Try the anusol hc cream externally twice daily for a week  Keep area clean and very dry   Update if not starting to improve in a week or if worsening

## 2022-09-21 NOTE — Assessment & Plan Note (Addendum)
She has flares  With adhesions from past ovarian cancer  Sees GI Dr Debbie Richardson Reviewed last note and plan  Taking xifaxan -starting to help  Also following fodmap diet  Constipation and bloating are improving now

## 2022-09-21 NOTE — Assessment & Plan Note (Signed)
Small ext hemorrhoid in setting of constipation/IBS flare  Px anusol hc cream Encouraged to try donut pillow, cold compress and avoid straining Keep area clean and dry  Update if not starting to improve in a week or if worsening   Reviewed last GI note

## 2022-09-21 NOTE — Progress Notes (Signed)
Subjective:    Patient ID: Debbie Richardson, female    DOB: 09-14-1960, 62 y.o.   MRN: [id number]  HPI  Wt Readings from Last 3 Encounters:  09/21/22 138 lb 2 oz (62.7 kg)  09/05/22 141 lb 6.4 oz (64.1 kg)  05/10/22 136 lb 6 oz (61.9 kg)   25.26 kg/m  Vitals:   09/21/22 1605  BP: 108/68  Pulse: 81  Temp: 97.9 F (36.6 C)  SpO2: 99%   Pt presents for urinary symptoms and also hemorrhoids with IBS   Came on Sat night  Taking over the counter med -  azo  Today is a little better   Was burning to urinate- improved today  Urgency and frequency - improved   No fever No n/v  Some back pain worse on left side  Hard to tell what is from GI and what is from urinary     Today has some pressure in low abdomen  Pain yesterday- advil helps    IBS Has history of scar tissue Sees GI  Dr Tobi Bastos  Causes constipation and bloating  Put her on xifaxan - on for 10-11 days  Fodmap diet  Lots of gas  Charcol for bloating  It is improved  This causes hemorrhoid issues   Hemorrhoids are very itchy  Burning  No bleeding    Tried witch hazel  Prep H  Baths    Ua today Results for orders placed or performed in visit on 09/21/22  POCT Urinalysis Dipstick (Automated)  Result Value Ref Range   Color, UA Yellow    Clarity, UA Clear    Glucose, UA Negative Negative   Bilirubin, UA Negative    Ketones, UA Negative    Spec Grav, UA 1.015 1.010 - 1.025   Blood, UA Negative    pH, UA 6.0 5.0 - 8.0   Protein, UA Negative Negative   Urobilinogen, UA 0.2 0.2 or 1.0 E.U./dL   Nitrite, UA Negative    Leukocytes, UA Negative Negative  POCT UA - Microscopic Only  Result Value Ref Range   WBC, Ur, HPF, POC 0-1 0 - 5   RBC, Urine, Miroscopic 0 0 - 2   Bacteria, U Microscopic 0 None - Trace   Mucus, UA few    Epithelial cells, urine per micros few    Crystals, Ur, HPF, POC none    Casts, Ur, LPF, POC none    Yeast, UA none        Patient Active Problem List   Diagnosis  Date Noted   External hemorrhoid 09/21/2022   IBS (irritable bowel syndrome) 09/21/2022   Genetic testing 05/26/2022   Routine general medical examination at a health care facility 04/25/2022   Orthopnea 12/30/2020   Lipid screening 06/21/2019   History of anaphylaxis 06/21/2019   Screening mammogram, encounter for 01/07/2019   Encounter for follow-up surveillance of ovarian cancer 10/03/2018   Menopause 09/14/2016   Neoplasm, ovary, malignant, unspecified laterality (HCC) 12/09/2015   Status post laparoscopic hysterectomy 10/29/2014   Dysuria 01/02/2008   GERD (gastroesophageal reflux disease) 05/01/2007   PSORIASIS NEC 12/07/2006   Past Medical History:  Diagnosis Date   Anxiety    Arthritis    Benign fundic gland polyps of stomach    Dizziness    vertigo   Gallstones    GERD (gastroesophageal reflux disease)    Ovarian cancer (HCC) 2016   Personal history of chemotherapy    Routine general medical examination at a health  care facility 04/25/2022   Past Surgical History:  Procedure Laterality Date   CESAREAN SECTION     CHOLECYSTECTOMY     COLONOSCOPY     COLONOSCOPY WITH PROPOFOL N/A 04/29/2021   Procedure: COLONOSCOPY WITH PROPOFOL;  Surgeon: Wyline Mood, MD;  Location: [facility] ENDOSCOPY;  Service: Gastroenterology;  Laterality: N/A;   DIAGNOSTIC LAPAROSCOPY     LAPAROSCOPIC HYSTERECTOMY N/A 10/29/2014   Procedure: HYSTERECTOMY TOTAL LAPAROSCOPIC, OMENTUMECTOMY;  Surgeon: Artelia Laroche, MD;  Location: [facility] ORS;  Service: Gynecology;  Laterality: N/A;   LAPAROSCOPIC OOPHERECTOMY     OOPHORECTOMY     PORTA CATH REMOVAL N/A 04/26/2018   Procedure: PORTA CATH REMOVAL;  Surgeon: Annice Needy, MD;  Location: [facility] INVASIVE CV LAB;  Service: Cardiovascular;  Laterality: N/A;   UPPER GI ENDOSCOPY     Social History   Tobacco Use   Smoking status: Former    Types: Cigarettes    Quit date: 04/05/1991    Years since quitting: 31.4   Smokeless tobacco: Never  Vaping Use    Vaping Use: Never used  Substance Use Topics   Alcohol use: Yes    Alcohol/week: 0.0 standard drinks of alcohol    Comment: 1 drink of wine daily   Drug use: No   Family History  Problem Relation Age of Onset   Hypertension Mother        15yo    Hyperlipidemia Mother    GER disease Mother    Heart disease Father        30yo   Breast cancer Sister 60   Heart disease Paternal Grandmother    Breast cancer Paternal Grandmother 54       deceased 47   Kidney disease Neg Hx    Liver disease Neg Hx    Colon cancer Neg Hx    Colon polyps Neg Hx    Esophageal cancer Neg Hx    Pancreatic cancer Neg Hx    Allergies  Allergen Reactions   Ciprofloxacin Anaphylaxis   Paclitaxel Shortness Of Breath and Other (See Comments)    Other reaction(s): Tight chest (finding) Chest and facial flushing Flushing, chest tightness, SOB w/ Taxol on 05/26/14 at [facility]   Flagyl [Metronidazole] Other (See Comments)    headache   Nutmeg Oil (Myristica Oil) Other (See Comments)    Tightness in chest and throat and difficulty swallowing and breathing.   Current Outpatient Medications on File Prior to Visit  Medication Sig Dispense Refill   pantoprazole (PROTONIX) 40 MG tablet Take 1 tablet (40 mg total) by mouth daily as needed. 30 tablet 3   rifaximin (XIFAXAN) 550 MG TABS tablet TAKE 1 TABLET (550 MG TOTAL) BY MOUTH 2 (TWO) TIMES DAILY FOR 14 DAYS. 28 tablet 0   No current facility-administered medications on file prior to visit.    Review of Systems  Constitutional:  Negative for activity change, appetite change, fever and unexpected weight change.  HENT:  Negative for congestion, ear pain, rhinorrhea, sinus pressure and sore throat.   Eyes:  Negative for pain, redness and visual disturbance.  Respiratory:  Negative for cough, shortness of breath and wheezing.   Cardiovascular:  Negative for chest pain and palpitations.  Gastrointestinal:  Positive for constipation and rectal pain.  Negative for abdominal distention, abdominal pain, anal bleeding, blood in stool, diarrhea, nausea and vomiting.       Rectal itching   Endocrine: Negative for polydipsia and polyuria.  Genitourinary:  Positive for frequency. Negative for  dysuria and urgency.       Dysuria and urgency have improved today  Musculoskeletal:  Negative for arthralgias, back pain and myalgias.  Skin:  Negative for pallor and rash.  Allergic/Immunologic: Negative for environmental allergies.  Neurological:  Negative for dizziness, syncope and headaches.  Hematological:  Negative for adenopathy. Does not bruise/bleed easily.  Psychiatric/Behavioral:  Negative for decreased concentration and dysphoric mood. The patient is not nervous/anxious.        Objective:   Physical Exam Constitutional:      General: She is not in acute distress.    Appearance: Normal appearance. She is well-developed and normal weight. She is not ill-appearing or diaphoretic.  HENT:     Head: Normocephalic and atraumatic.  Eyes:     Conjunctiva/sclera: Conjunctivae normal.     Pupils: Pupils are equal, round, and reactive to light.  Cardiovascular:     Rate and Rhythm: Normal rate and regular rhythm.     Heart sounds: Normal heart sounds.  Pulmonary:     Effort: Pulmonary effort is normal.     Breath sounds: Normal breath sounds.  Abdominal:     General: Abdomen is flat. Bowel sounds are normal. There is no distension.     Palpations: Abdomen is soft. There is no hepatomegaly, splenomegaly, mass or pulsatile mass.     Tenderness: There is abdominal tenderness in the suprapubic area. There is no right CVA tenderness, left CVA tenderness, guarding or rebound.     Hernia: No hernia is present.     Comments: No cva tenderness  Mild suprapubic tenderness  Genitourinary:    Comments: Very small external hemorrhoid at 7:00 Does not seem thrombosed  No bleeding  Normal rectal tone  Mildly tender    Musculoskeletal:     Cervical  back: Normal range of motion and neck supple.  Lymphadenopathy:     Cervical: No cervical adenopathy.  Skin:    General: Skin is warm and dry.     Findings: No rash.  Neurological:     Mental Status: She is alert.  Psychiatric:        Mood and Affect: Mood normal.           Assessment & Plan:   Problem List Items Addressed This Visit       Cardiovascular and Mediastinum   External hemorrhoid    Small ext hemorrhoid in setting of constipation/IBS flare  Px anusol hc cream Encouraged to try donut pillow, cold compress and avoid straining Keep area clean and dry  Update if not starting to improve in a week or if worsening   Reviewed last GI note         Digestive   IBS (irritable bowel syndrome)    She has flares  With adhesions from past ovarian cancer  Sees GI Dr Tobi Bastos Reviewed last note and plan  Taking xifaxan -starting to help  Also following fodmap diet  Constipation and bloating are improving now         Other   Dysuria - Primary    Dysuria with frequency and urgency in setting of GI bloating  This is improved after use of azo and drinking much water Ua clear (dip and micro) Sent for culture  Will update with result Instructed to call if symptoms worsen in meantime       Relevant Orders   POCT Urinalysis Dipstick (Automated) (Completed)   POCT UA - Microscopic Only (Completed)   Urine Culture

## 2022-09-21 NOTE — Assessment & Plan Note (Signed)
Dysuria with frequency and urgency in setting of GI bloating  This is improved after use of azo and drinking much water Ua clear (dip and micro) Sent for culture  Will update with result Instructed to call if symptoms worsen in meantime

## 2022-09-22 LAB — URINE CULTURE
MICRO NUMBER:: 15102181
Result:: NO GROWTH
SPECIMEN QUALITY:: ADEQUATE

## 2022-10-05 ENCOUNTER — Telehealth: Payer: Self-pay | Admitting: Nurse Practitioner

## 2022-10-05 NOTE — Telephone Encounter (Signed)
Patient called and asked me to send a message to lauren. She said she isn't supposed to come to gyn onc until 8/21 but said she was having some G.I problems. She said it was causing discomfort. I told her I would send a message to Lauren and her team and let them know what was going on.

## 2022-10-10 ENCOUNTER — Inpatient Hospital Stay: Payer: BC Managed Care – PPO | Attending: Obstetrics and Gynecology

## 2022-10-10 ENCOUNTER — Other Ambulatory Visit: Payer: Self-pay

## 2022-10-10 ENCOUNTER — Other Ambulatory Visit: Payer: Self-pay | Admitting: *Deleted

## 2022-10-10 DIAGNOSIS — C569 Malignant neoplasm of unspecified ovary: Secondary | ICD-10-CM

## 2022-10-10 DIAGNOSIS — Z8543 Personal history of malignant neoplasm of ovary: Secondary | ICD-10-CM | POA: Diagnosis not present

## 2022-10-10 NOTE — Telephone Encounter (Signed)
Pt called to schedule her lab appointment. She has been added to the schedule for today

## 2022-10-11 LAB — CA 125: Cancer Antigen (CA) 125: 8.8 U/mL (ref 0.0–38.1)

## 2022-11-21 ENCOUNTER — Other Ambulatory Visit: Payer: Self-pay | Admitting: Family Medicine

## 2022-11-21 DIAGNOSIS — Z1231 Encounter for screening mammogram for malignant neoplasm of breast: Secondary | ICD-10-CM

## 2022-11-23 ENCOUNTER — Inpatient Hospital Stay: Payer: BC Managed Care – PPO | Attending: Obstetrics and Gynecology | Admitting: Obstetrics and Gynecology

## 2022-11-23 VITALS — BP 109/83 | HR 81 | Temp 97.6°F | Resp 20 | Wt 140.5 lb

## 2022-11-23 DIAGNOSIS — Z90722 Acquired absence of ovaries, bilateral: Secondary | ICD-10-CM | POA: Insufficient documentation

## 2022-11-23 DIAGNOSIS — Z08 Encounter for follow-up examination after completed treatment for malignant neoplasm: Secondary | ICD-10-CM | POA: Diagnosis not present

## 2022-11-23 DIAGNOSIS — Z90710 Acquired absence of both cervix and uterus: Secondary | ICD-10-CM | POA: Diagnosis not present

## 2022-11-23 DIAGNOSIS — Z8543 Personal history of malignant neoplasm of ovary: Secondary | ICD-10-CM | POA: Diagnosis not present

## 2022-11-23 DIAGNOSIS — Z9221 Personal history of antineoplastic chemotherapy: Secondary | ICD-10-CM | POA: Insufficient documentation

## 2022-11-23 NOTE — Progress Notes (Signed)
Gynecologic Oncology Interval Note  Referring Provider: Dr. 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 She is on control arm of GOG225 and completed the active portion of the study on 03/15/17.   She was last seen in August 2023.  Her abdominal symptoms are stable. No gyn complaints.  She presents today for continued surveillance.   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 See prior notes for complete details. She was taken emergently to the operating room on 04/28/2014 by Dr. Jean Rosenthal and a laparoscopic procedure was performed.  Bilateral salpingo-oophorectomy with  placement of the cystic lesions in the Endo Catch bag and removal through the LLQ port. Per the operative note the right ovary did rupture and contents of the cysts were spilled into the pelvis.  The final pathology revealed high-grade serous adenocarcinoma involving both ovaries and fallopian tubes. Washings also revealed clusters of highly atypical glandular cells and surface involvement of the ovary was present. Staging was felt to be stage II at least high grade serous ovarian cancer. Preoperative CA-125 was 45.   She received 6 cycles of carboplatin/taxane chemotherapy via an IV port, last treatment June 2016.  She had an allergy to taxol and was switched to taxotere for the last 4 cycles. She underwent interval  TLH/omentectomy on 10/29/2014 for ovarian cancer. Her final pathology was negative. She enrolled on GOG225.   CT scan 01/09/2015 negative  CT scan 06/24/2015 IMPRESSION: No evidence of urolithiasis, hydronephrosis, or other acute Findings.  Colonic diverticulosis. No radiographic evidence of diverticulitis.  CT scan of abdomen and pelvis 12/09/2015 IMPRESSION: Status post hysterectomy and bilateral salpingo-oophorectomy. No evidence of recurrent or metastatic disease  PET scan 09/19/2016 IMPRESSION: Negative PET-CT.  No findings for hypermetabolic recurrent tumor.  CT scan of abdomen and pelvis 02/27/2017  IMPRESSION: 1. No acute findings are noted in the abdomen or pelvis to account for the patient's symptoms. Notably, there is a low stool burden despite the patient's reported history of constipation. 2. Colonic diverticulosis without evidence of acute diverticulitis at this time. 3. Additional incidental findings, as above.   She had menopausal symptoms and we discussed Effexor therapy, but she did not pursue. She has used magnesium and felt that improved her symptoms.   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 Sharlet Salina, GI. She has been on multiple antibiotics and Xifaxan with complete resolution of symptoms. But recurrent symptoms.    09/22/2017 CT scan A/P  IMPRESSION: 1. Uterus and ovaries absent. No pelvic mass. No adenopathy. No a mental lesions. No liver lesions evident. 2. No appreciable bowel wall thickening or bowel obstruction. There are occasional sigmoid diverticula without diverticulitis. No abscess in the abdomen or pelvis. Appendix appears normal. 3. No evident renal or ureteral calculus on either side. No hydronephrosis.  12/13/2017 She saw Dr. Donneta Romberg on  with a negative exam and CA 125 8.9.  03/06/2018  She saw Dr. Sharlet Salina, GI, who recommended Suggest trial of activated charcoal tablets and  LOW FODMAP diet for Small Intestinal Bacterial Overgrowth. Her symptoms have continued to worsen and she is really miserable.   03/14/2018.  Negative exam and normal Ca125 however, GI symptoms concerning for recurrence versus persistent small intestinal bacterial overgrowth syndrome. CT Abdomen Pelvis on 03/22/2018 showed no acute abdominal/pelvic findings, masses, or adenopathy. No worrisome peritoneal or omental implants.   Port was removed by Dr. Wyn Quaker on 04/26/2018.    seen in July 2020 with negative exam and normal CA 125. GI symptoms at that time were improved. She has history of small intestinal bacterial overgrowth syndrome with previous negative imaging in December 2019.   03/28/2021 IMPRESSION: 1. Mild wall thickening with adjacent stranding involving the descending and sigmoid colon which may reflect mild colitis.  2. Prominence of the intra and extrahepatic biliary tree favored reservoir effect post cholecystectomy.  CA-125 results postchemotherapy 02/11/2015  10.4 03/12/2015  10.7 06/10/2015  10.0 03/15/2016   10.1 06/09/2016  9.9 09/14/2016  10.9 12/14/2016  9.8 02/17/17  12.1 03/20/2017  9.9 06/04/2017  11.4 09/13/2017  9.5 12/13/2017  8.9 03/14/2018 10.4 06/13/2018 9.2 09/10/2018 9.7 01/04/2019 10.2 05/06/2019 9.0 12/03/2019  10 09/11/20 8.6 05/26/21 9.2 11/15/21 8.6 10/10/2022 8.8  Genetic testing- [employer] 83 gene Multi-Cancer Panel was negative for pathogenic mutation. She did have a VUS: SDHA c. 155C>T.   Screening mammogram - 11/22/2021 Screening Mammogram- Bi-rads category 1: negative   Problem List: Patient Active Problem List   Diagnosis Date Noted   External hemorrhoid 09/21/2022   IBS (irritable bowel syndrome) 09/21/2022   Genetic testing 05/26/2022   Routine general medical examination at a health care facility 04/25/2022   Orthopnea 12/30/2020   Lipid screening 06/21/2019   History of anaphylaxis 06/21/2019   Screening mammogram, encounter for 01/07/2019    Encounter for follow-up surveillance of ovarian cancer 10/03/2018   Menopause 09/14/2016   Neoplasm, ovary, malignant, unspecified laterality (HCC) 12/09/2015   Status post laparoscopic hysterectomy 10/29/2014   Dysuria 01/02/2008   GERD (gastroesophageal reflux disease) 05/01/2007   PSORIASIS NEC 12/07/2006    Past Medical History: Past Medical History:  Diagnosis Date   Anxiety    Arthritis    Benign fundic gland polyps of stomach    Dizziness    vertigo   Gallstones    GERD (gastroesophageal reflux disease)    Ovarian cancer (HCC) 2016   Personal history of chemotherapy    Routine general medical examination at a health care facility 04/25/2022    Past Surgical History: Past Surgical History:  Procedure Laterality Date   CESAREAN SECTION     CHOLECYSTECTOMY     COLONOSCOPY     COLONOSCOPY WITH PROPOFOL N/A 04/29/2021   Procedure: COLONOSCOPY WITH PROPOFOL;  Surgeon: Wyline Mood, MD;  Location: [facility] ENDOSCOPY;  Service: Gastroenterology;  Laterality: N/A;   DIAGNOSTIC LAPAROSCOPY     LAPAROSCOPIC HYSTERECTOMY N/A 10/29/2014   Procedure: HYSTERECTOMY TOTAL LAPAROSCOPIC, OMENTUMECTOMY;  Surgeon: Artelia Laroche, MD;  Location: [facility] ORS;  Service: Gynecology;  Laterality: N/A;   LAPAROSCOPIC OOPHERECTOMY     OOPHORECTOMY     PORTA CATH REMOVAL N/A 04/26/2018   Procedure: PORTA CATH REMOVAL;  Surgeon: Annice Needy, MD;  Location: [facility] INVASIVE CV LAB;  Service: Cardiovascular;  Laterality: N/A;   UPPER GI ENDOSCOPY      Family  History: Family History  Problem Relation Age of Onset   Hypertension Mother        36yo    Hyperlipidemia Mother    GER disease Mother    Heart disease Father        9yo   Breast cancer Sister 9   Heart disease Paternal Grandmother    Breast cancer Paternal Grandmother 61       deceased 58   Kidney disease Neg Hx    Liver disease Neg Hx    Colon cancer Neg Hx    Colon polyps Neg Hx    Esophageal cancer Neg Hx    Pancreatic  cancer Neg Hx     Social History: Social History   Socioeconomic History   Marital status: Divorced    Spouse name: Not on file   Number of children: 1   Years of education: Not on file   Highest education level: Not on file  Occupational History   Not on file  Tobacco Use   Smoking status: Former    Current packs/day: 0.00    Types: Cigarettes    Quit date: 04/05/1991    Years since quitting: 31.6   Smokeless tobacco: Never  Vaping Use   Vaping status: Never Used  Substance and Sexual Activity   Alcohol use: Yes    Alcohol/week: 0.0 standard drinks of alcohol    Comment: 1 drink of wine daily   Drug use: No   Sexual activity: Yes  Other Topics Concern   Not on file  Social History Narrative   Not on file   Social Determinants of Health   Financial Resource Strain: Not on file  Food Insecurity: Not on file  Transportation Needs: Not on file  Physical Activity: Not on file  Stress: Not on file  Social Connections: Not on file  Intimate Partner Violence: Not on file    Allergies: Allergies  Allergen Reactions   Ciprofloxacin Anaphylaxis   Paclitaxel Shortness Of Breath and Other (See Comments)    Other reaction(s): Tight chest (finding) Chest and facial flushing Flushing, chest tightness, SOB w/ Taxol on 05/26/14 at [facility].   Flagyl [Metronidazole] Other (See Comments)    headache   Nutmeg Oil (Myristica Oil) Other (See Comments)    Tightness in chest and throat and difficulty swallowing and breathing.     Review of Systems General:  no complaints Skin: no complaints Eyes: no complaints HEENT: no complaints Breasts: no complaints Pulmonary: no complaints Cardiac: no complaints Gastrointestinal: stable complaints Genitourinary/Sexual: no complaints Ob/Gyn: no complaints Musculoskeletal: no complaints Hematology: no complaints Neurologic/Psych: no complaints   Objective:   Vitals:   11/23/22 1405  BP: 109/83  Pulse: 81  Resp: 20   Temp: 97.6 F (36.4 C)  SpO2: 100%     Body mass index is 25.7 kg/m.  GENERAL: Patient is a well appearing female in no acute distress HEENT:  PERRL, neck supple with midline trachea. Thyroid without masses.  NODES:  No cervical, supraclavicular, axillary, or inguinal lymphadenopathy palpated.  LUNGS:  Clear to auscultation bilaterally.   HEART:  Regular rate and rhythm.  ABDOMEN:  Soft, nontender. Nondistended. No masses or hernias or ascites.   EXTREMITIES:  No peripheral edema.   SKIN:  Clear with no obvious rashes or skin changes. No nail dyscrasia. NEURO:  Nonfocal. Well oriented.  Appropriate affect.  Pelvic: chaperoned by CMA EGBUS: no lesions Cervix: absent Vagina: atrophic with some right vaginal fornix scattered small punctate erythematous  tissue, smooth to palpation; o/w no lesions, no discharge or bleeding Uterus: absent BME no palpable masses     Assessment:  KARN BARONA is a 62 y.o. 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 No evidence of disease on CT or exam and CA125 normal in March 2019. Interval TLH/Omentectomy 7/16 negative for malignancy. Interval imaging significant for diverticular disease. Persistent pain worked up by GI and thought to be consistent with bacterial overgrowth syndrome and adhesive disease. Prior CT scans and PET all negative for recurrent disease. Port removed. Normal CA-125. NED on exam today.   Mild wall thickening with adjacent stranding involving the descending and sigmoid colon which may reflect mild colitis.  Plan:   Problem List Items Addressed This Visit       Other   Encounter for follow-up surveillance of ovarian cancer - Primary    CA 125 remains normal and patient is asymptomatic. Continue annual surveillancw with ca 125.   Precautions provided and to contact us if needed for any gynecologic related concerns.   I  personally had a face to face interaction and evaluated the patient jointly with the NP, Ms. Consuello Masse.  I have reviewed her history and available records and have performed the key portions of the physical exam including HEENT, abdominal exam, pelvic exam with my findings confirming those documented above by the APP.  I have discussed the case with the APP and the patient.  I agree with the above documentation, assessment and plan which was fully formulated by me.  Counseling was completed by me.   I personally saw the patient and performed a substantive portion of this encounter in conjunction with the listed APP as documented above.  Andrewjames Weirauch Leta Jungling, MD

## 2022-11-30 ENCOUNTER — Telehealth: Payer: Self-pay | Admitting: Nurse Practitioner

## 2022-11-30 NOTE — Telephone Encounter (Signed)
VM left for patient to follow up on concern.

## 2022-12-09 ENCOUNTER — Ambulatory Visit
Admission: RE | Admit: 2022-12-09 | Discharge: 2022-12-09 | Disposition: A | Discharge status: Home / Self Care | Payer: BC Managed Care – PPO | Source: Ambulatory Visit | Attending: Family Medicine | Admitting: Family Medicine

## 2022-12-09 DIAGNOSIS — Z1231 Encounter for screening mammogram for malignant neoplasm of breast: Secondary | ICD-10-CM | POA: Insufficient documentation

## 2023-01-25 ENCOUNTER — Ambulatory Visit: Payer: BC Managed Care – PPO

## 2023-04-13 ENCOUNTER — Other Ambulatory Visit: Payer: Self-pay

## 2023-04-13 ENCOUNTER — Inpatient Hospital Stay: Payer: BC Managed Care – PPO | Attending: Obstetrics and Gynecology

## 2023-04-13 DIAGNOSIS — C569 Malignant neoplasm of unspecified ovary: Secondary | ICD-10-CM

## 2023-04-13 DIAGNOSIS — Z8543 Personal history of malignant neoplasm of ovary: Secondary | ICD-10-CM | POA: Insufficient documentation

## 2023-04-13 DIAGNOSIS — H903 Sensorineural hearing loss, bilateral: Secondary | ICD-10-CM | POA: Diagnosis not present

## 2023-04-15 LAB — CA 125: Cancer Antigen (CA) 125: 8.9 U/mL (ref 0.0–38.1)

## 2023-04-19 ENCOUNTER — Inpatient Hospital Stay: Payer: BC Managed Care – PPO

## 2023-04-26 ENCOUNTER — Encounter: Payer: Self-pay | Admitting: Nurse Practitioner

## 2023-04-27 DIAGNOSIS — H903 Sensorineural hearing loss, bilateral: Secondary | ICD-10-CM | POA: Diagnosis not present

## 2023-05-10 ENCOUNTER — Inpatient Hospital Stay: Payer: BC Managed Care – PPO

## 2023-05-10 DIAGNOSIS — J069 Acute upper respiratory infection, unspecified: Secondary | ICD-10-CM | POA: Diagnosis not present

## 2023-05-11 DIAGNOSIS — H109 Unspecified conjunctivitis: Secondary | ICD-10-CM | POA: Diagnosis not present

## 2023-05-16 ENCOUNTER — Encounter: Payer: Self-pay | Admitting: Family Medicine

## 2023-05-16 ENCOUNTER — Ambulatory Visit: Payer: BC Managed Care – PPO | Admitting: Family Medicine

## 2023-05-16 VITALS — BP 130/80 | HR 95 | Temp 98.8°F | Ht 62.0 in | Wt 138.2 lb

## 2023-05-16 DIAGNOSIS — Z1331 Encounter for screening for depression: Secondary | ICD-10-CM | POA: Insufficient documentation

## 2023-05-16 DIAGNOSIS — J0100 Acute maxillary sinusitis, unspecified: Secondary | ICD-10-CM

## 2023-05-16 MED ORDER — AMOXICILLIN 875 MG PO TABS: 875.0000 mg | ORAL_TABLET | Freq: Two times a day (BID) | ORAL | 0 refills | Status: DC

## 2023-05-16 NOTE — Assessment & Plan Note (Signed)
In setting of stress Got sick out of town  Not sleeping   Will re eval when feeling better  Treating for flu and sinusitis today

## 2023-05-16 NOTE — Assessment & Plan Note (Signed)
S/p flu for over 10 days (with conjunctivitis that is improving) Reviewed history  On exam -good air exch, some facial tenderness left frontal and maxillary Treatment with amox (pt prefers to augmentin)  See AVS for symptom care Expectorant with fluids will be essential for congestion Continue tessalon if helpful  Update if not starting to improve in a week or if worsening  Call back and Er precautions noted in detail today    Consider prednisone if wheezing or if worse congestion  Handouts given

## 2023-05-16 NOTE — Patient Instructions (Addendum)
Drink lots and lots of fluids  Take mucinex DM or robitussin dm to loosen mucous and suppress cough  You can take the tessalon pearles also for cough   Take amoxicillin as directed for sinus infection  Breathe steam  Nasal saline spray is helpful also  Use warm compresses on face if needed    Update if not starting to improve in a week or if worsening    If any severe symptoms or you have trouble breathing-go to the ER

## 2023-05-16 NOTE — Progress Notes (Signed)
Subjective:    Patient ID: Debbie Richardson, female    DOB: [birth year], 63 y.o.   MRN: [id number]  HPI  Wt Readings from Last 3 Encounters:  05/16/23 138 lb 4 oz (62.7 kg)  11/23/22 140 lb 8 oz (63.7 kg)  09/21/22 138 lb 2 oz (62.7 kg)   25.29 kg/m  Vitals:   05/16/23 1029  BP: 130/80  Pulse: 95  Temp: 98.8 F (37.1 C)  SpO2: 96%     Pt presents for cough/congestion and headache  Was exposed to family with flu at end of jan   Went to UC 2/5 Dx with probable flu  Prescription tessalon and emycin eye ointment   Had fever and body aches 3 d / better now    The congestion in chest and head will not clear  Mucous is colored  Some blood from left nostril  Cough is worse at night time   A little wheezing at night ? Vs mucous  Sleeping propped up   A lot of sinus pain  Worse in left cheek   Itching eyes    Over the counter  Tylenol last night   Sudafed early on  Has not had expectorant  Did try delsum  Some saline rinses    High stress level in setting of getting sick out of town    05/16/2023   10:39 AM 09/21/2022    4:16 PM 05/10/2022    8:06 AM 12/30/2020   11:37 AM 06/21/2019    8:12 AM  Depression screen PHQ 2/9  Decreased Interest 3 0 0 0 0  Down, Depressed, Hopeless 0 0 0 0 0  PHQ - 2 Score 3 0 0 0 0  Altered sleeping 3 1 1     Tired, decreased energy 3 1 0    Change in appetite 3 0 1    Feeling bad or failure about yourself  0 0 0    Trouble concentrating 3 0 0    Moving slowly or fidgety/restless 0 0 0    Suicidal thoughts 0 0 0    PHQ-9 Score 15 2 2     Difficult doing work/chores Not difficult at all Not difficult at all Not difficult at all     Will watch this  Discuss further next time      Patient Active Problem List   Diagnosis Date Noted   Positive screening for depression on 9-item Patient Health Questionnaire (PHQ-9) 05/16/2023   External hemorrhoid 09/21/2022   IBS (irritable bowel syndrome) 09/21/2022   Genetic testing  05/26/2022   Routine general medical examination at a health care facility 04/25/2022   Orthopnea 12/30/2020   Lipid screening 06/21/2019   History of anaphylaxis 06/21/2019   Screening mammogram, encounter for 01/07/2019   Encounter for follow-up surveillance of ovarian cancer 10/03/2018   Menopause 09/14/2016   Neoplasm, ovary, malignant, unspecified laterality (HCC) 12/09/2015   Acute sinusitis 08/11/2015   Status post laparoscopic hysterectomy 10/29/2014   Dysuria 01/02/2008   GERD (gastroesophageal reflux disease) 05/01/2007   PSORIASIS NEC 12/07/2006   Past Medical History:  Diagnosis Date   Anxiety    Arthritis    Benign fundic gland polyps of stomach    Dizziness    vertigo   Gallstones    GERD (gastroesophageal reflux disease)    Ovarian cancer (HCC) 2016   Personal history of chemotherapy    Routine general medical examination at a health care facility 04/25/2022   Past Surgical History:  Procedure  Laterality Date   CESAREAN SECTION     CHOLECYSTECTOMY     COLONOSCOPY     COLONOSCOPY WITH PROPOFOL N/A 04/29/2021   Procedure: COLONOSCOPY WITH PROPOFOL;  Surgeon: Wyline Mood, MD;  Location: [facility] ENDOSCOPY;  Service: Gastroenterology;  Laterality: N/A;   DIAGNOSTIC LAPAROSCOPY     LAPAROSCOPIC HYSTERECTOMY N/A 10/29/2014   Procedure: HYSTERECTOMY TOTAL LAPAROSCOPIC, OMENTUMECTOMY;  Surgeon: Artelia Laroche, MD;  Location: [facility] ORS;  Service: Gynecology;  Laterality: N/A;   LAPAROSCOPIC OOPHERECTOMY     OOPHORECTOMY     PORTA CATH REMOVAL N/A 04/26/2018   Procedure: PORTA CATH REMOVAL;  Surgeon: Annice Needy, MD;  Location: [facility] INVASIVE CV LAB;  Service: Cardiovascular;  Laterality: N/A;   UPPER GI ENDOSCOPY     Social History   Tobacco Use   Smoking status: Former    Current packs/day: 0.00    Types: Cigarettes    Quit date: 04/05/1991    Years since quitting: 32.1   Smokeless tobacco: Never  Vaping Use   Vaping status: Never Used  Substance Use  Topics   Alcohol use: Yes    Alcohol/week: 0.0 standard drinks of alcohol    Comment: 1 drink of wine daily   Drug use: No   Family History  Problem Relation Age of Onset   Hypertension Mother        27yo    Hyperlipidemia Mother    GER disease Mother    Heart disease Father        87yo   Breast cancer Sister 76   Heart disease Paternal Grandmother    Breast cancer Paternal Grandmother 69       deceased 91   Kidney disease Neg Hx    Liver disease Neg Hx    Colon cancer Neg Hx    Colon polyps Neg Hx    Esophageal cancer Neg Hx    Pancreatic cancer Neg Hx    Allergies  Allergen Reactions   Ciprofloxacin Anaphylaxis   Paclitaxel Shortness Of Breath and Other (See Comments)    Other reaction(s): Tight chest (finding) Chest and facial flushing Flushing, chest tightness, SOB w/ Taxol on 05/26/14 at [facility].   Flagyl [Metronidazole] Other (See Comments)    headache   Nutmeg Oil (Myristica Oil) Other (See Comments)    Tightness in chest and throat and difficulty swallowing and breathing.   Current Outpatient Medications on File Prior to Visit  Medication Sig Dispense Refill   hydrocortisone (ANUSOL-HC) 2.5 % rectal cream Apply 1 Application topically 2 (two) times daily. For 1 week 30 g 0   pantoprazole (PROTONIX) 40 MG tablet Take 1 tablet (40 mg total) by mouth daily as needed. 30 tablet 3   rifaximin (XIFAXAN) 550 MG TABS tablet TAKE 1 TABLET (550 MG TOTAL) BY MOUTH 2 (TWO) TIMES DAILY FOR 14 DAYS. (Patient taking differently: Take 550 mg by mouth daily as needed.) 28 tablet 0   No current facility-administered medications on file prior to visit.    Review of Systems  Constitutional:  Positive for appetite change and fatigue. Negative for fever.  HENT:  Positive for congestion, ear pain, postnasal drip, rhinorrhea, sinus pressure and sore throat. Negative for nosebleeds.   Eyes:  Negative for pain, redness and itching.  Respiratory:  Positive for cough. Negative  for shortness of breath and wheezing.   Cardiovascular:  Negative for chest pain.  Gastrointestinal:  Negative for abdominal pain, diarrhea, nausea and vomiting.  Endocrine: Negative for polyuria.  Genitourinary:  Negative for dysuria, frequency and urgency.  Musculoskeletal:  Negative for arthralgias and myalgias.  Allergic/Immunologic: Negative for immunocompromised state.  Neurological:  Positive for headaches. Negative for dizziness, tremors, syncope, weakness and numbness.  Hematological:  Negative for adenopathy. Does not bruise/bleed easily.  Psychiatric/Behavioral:  Negative for dysphoric mood. The patient is not nervous/anxious.        Objective:   Physical Exam Constitutional:      General: She is not in acute distress.    Appearance: Normal appearance. She is well-developed and normal weight. She is not ill-appearing.  HENT:     Head: Normocephalic and atraumatic.     Comments: Bilateral maxillary and frontal sinus tenderness- worst in left maxillary area     Right Ear: Tympanic membrane and external ear normal.     Left Ear: Tympanic membrane and external ear normal.     Nose: Congestion and rhinorrhea present.     Mouth/Throat:     Pharynx: Oropharynx is clear. No oropharyngeal exudate or posterior oropharyngeal erythema.     Comments: Clear pnd Eyes:     General:        Right eye: No discharge.        Left eye: No discharge.     Conjunctiva/sclera: Conjunctivae normal.     Pupils: Pupils are equal, round, and reactive to light.     Comments: No excessive tearing or discharge   Cardiovascular:     Rate and Rhythm: Normal rate and regular rhythm.  Pulmonary:     Effort: Pulmonary effort is normal. No respiratory distress.     Breath sounds: Normal breath sounds. No stridor. No wheezing, rhonchi or rales.     Comments: Good air exch No rales or rhonchi No wheeze even with forced expiration  Musculoskeletal:     Cervical back: Normal range of motion and neck  supple.  Lymphadenopathy:     Cervical: No cervical adenopathy.  Skin:    General: Skin is warm and dry.     Findings: No rash.  Neurological:     Mental Status: She is alert.     Cranial Nerves: No cranial nerve deficit.     Coordination: Coordination normal.  Psychiatric:        Mood and Affect: Mood normal.           Assessment & Plan:   Problem List Items Addressed This Visit       Respiratory   Acute sinusitis - Primary   S/p flu for over 10 days (with conjunctivitis that is improving) Reviewed history  On exam -good air exch, some facial tenderness left frontal and maxillary Treatment with amox (pt prefers to augmentin)  See AVS for symptom care Expectorant with fluids will be essential for congestion Continue tessalon if helpful  Update if not starting to improve in a week or if worsening  Call back and Er precautions noted in detail today    Consider prednisone if wheezing or if worse congestion  Handouts given      Relevant Medications   amoxicillin (AMOXIL) 875 MG tablet     Other   Positive screening for depression on 9-item Patient Health Questionnaire (PHQ-9)   In setting of stress Got sick out of town  Not sleeping   Will re eval when feeling better  Treating for flu and sinusitis today

## 2023-05-26 ENCOUNTER — Other Ambulatory Visit: Payer: BC Managed Care – PPO

## 2023-05-29 DIAGNOSIS — H10013 Acute follicular conjunctivitis, bilateral: Secondary | ICD-10-CM | POA: Diagnosis not present

## 2023-06-08 ENCOUNTER — Other Ambulatory Visit: Payer: Self-pay | Admitting: Family Medicine

## 2023-06-12 ENCOUNTER — Other Ambulatory Visit: Payer: Self-pay | Admitting: Gastroenterology

## 2023-06-21 ENCOUNTER — Inpatient Hospital Stay: Payer: BC Managed Care – PPO | Attending: Obstetrics and Gynecology | Admitting: Obstetrics and Gynecology

## 2023-06-21 VITALS — BP 119/87 | HR 94 | Temp 98.6°F | Resp 20

## 2023-06-21 DIAGNOSIS — N9089 Other specified noninflammatory disorders of vulva and perineum: Secondary | ICD-10-CM | POA: Insufficient documentation

## 2023-06-21 DIAGNOSIS — Z8543 Personal history of malignant neoplasm of ovary: Secondary | ICD-10-CM | POA: Insufficient documentation

## 2023-06-21 DIAGNOSIS — Z90722 Acquired absence of ovaries, bilateral: Secondary | ICD-10-CM | POA: Diagnosis not present

## 2023-06-21 DIAGNOSIS — Z08 Encounter for follow-up examination after completed treatment for malignant neoplasm: Secondary | ICD-10-CM | POA: Insufficient documentation

## 2023-06-21 DIAGNOSIS — Z9079 Acquired absence of other genital organ(s): Secondary | ICD-10-CM | POA: Insufficient documentation

## 2023-06-21 DIAGNOSIS — N760 Acute vaginitis: Secondary | ICD-10-CM | POA: Diagnosis not present

## 2023-06-21 DIAGNOSIS — N898 Other specified noninflammatory disorders of vagina: Secondary | ICD-10-CM | POA: Insufficient documentation

## 2023-06-21 DIAGNOSIS — Z9221 Personal history of antineoplastic chemotherapy: Secondary | ICD-10-CM | POA: Insufficient documentation

## 2023-06-21 DIAGNOSIS — K6289 Other specified diseases of anus and rectum: Secondary | ICD-10-CM | POA: Diagnosis not present

## 2023-06-21 DIAGNOSIS — C569 Malignant neoplasm of unspecified ovary: Secondary | ICD-10-CM

## 2023-06-21 DIAGNOSIS — L989 Disorder of the skin and subcutaneous tissue, unspecified: Secondary | ICD-10-CM

## 2023-06-21 DIAGNOSIS — Z90710 Acquired absence of both cervix and uterus: Secondary | ICD-10-CM | POA: Insufficient documentation

## 2023-06-21 NOTE — Progress Notes (Signed)
 Gynecologic Oncology Interval Note  Referring Provider: Dr. 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 She is on control arm of GOG225 and completed the active portion of the study on 03/15/17.   She was last seen in August 2024.  She presents today for continued surveillance.              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 See prior notes for complete details. She was taken emergently to the operating room on 04/28/2014 by Dr. Jean Rosenthal and a laparoscopic procedure was performed.  Bilateral salpingo-oophorectomy with  placement of the cystic lesions in the Endo Catch bag and removal through the LLQ port. Per the operative note the right ovary did rupture and contents of the cysts were spilled into the pelvis.  The final pathology revealed high-grade serous adenocarcinoma involving both ovaries and fallopian tubes. Washings also revealed clusters of highly atypical glandular cells and surface involvement of the ovary was present. Staging was felt to be stage II at least high grade serous ovarian cancer. Preoperative CA-125 was 45.   She received 6 cycles of carboplatin/taxane chemotherapy via an IV port, last treatment June 2016.  She had an allergy to taxol and was switched to taxotere for the last 4 cycles. She underwent interval TLH/omentectomy on 10/29/2014 for ovarian cancer.  Her final pathology was negative. She enrolled on GOG225.   CT scan 01/09/2015 negative  CT scan 06/24/2015 IMPRESSION: No evidence of urolithiasis, hydronephrosis, or other acute Findings.  Colonic diverticulosis. No radiographic evidence of diverticulitis.  CT scan of abdomen and pelvis 12/09/2015 IMPRESSION: Status post hysterectomy and bilateral salpingo-oophorectomy. No evidence of recurrent or metastatic disease  PET scan 09/19/2016 IMPRESSION: Negative PET-CT.  No findings for hypermetabolic recurrent tumor.  CT scan of abdomen and pelvis 02/27/2017  IMPRESSION: 1. No acute findings are noted in the abdomen or pelvis to account for the patient's symptoms. Notably, there is a low stool burden despite the patient's reported history of constipation. 2. Colonic diverticulosis without evidence of acute diverticulitis at this time. 3. Additional incidental findings, as above.   She had menopausal symptoms and we discussed Effexor therapy, but she did not pursue. She has used magnesium and felt that improved her symptoms.   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 Sharlet Salina, GI. She has been on multiple antibiotics and Xifaxan with complete resolution of symptoms. But recurrent symptoms.    09/22/2017 CT scan A/P  IMPRESSION: 1. Uterus and ovaries absent. No pelvic mass. No adenopathy. No a mental lesions. No liver lesions evident. 2. No appreciable bowel wall thickening or bowel obstruction. There are occasional sigmoid diverticula without diverticulitis. No abscess in the abdomen or pelvis. Appendix appears normal. 3. No evident renal or ureteral calculus on either side.  No hydronephrosis.   12/13/2017 She saw Dr. Donneta Romberg on  with a negative exam and CA 125 8.9.  03/06/2018  She saw Dr. Sharlet Salina, GI, who recommended Suggest trial of activated charcoal tablets and LOW FODMAP diet for Small Intestinal Bacterial  Overgrowth. Her symptoms have continued to worsen and she is really miserable.   03/14/2018.  Negative exam and normal Ca125 however, GI symptoms concerning for recurrence versus persistent small intestinal bacterial overgrowth syndrome. CT Abdomen Pelvis on 03/22/2018 showed no acute abdominal/pelvic findings, masses, or adenopathy. No worrisome peritoneal or omental implants.   Port was removed by Dr. Wyn Quaker on 04/26/2018.    seen in July 2020 with negative exam and normal CA 125. GI symptoms at that time were improved. She has history of small intestinal bacterial overgrowth syndrome with previous negative imaging in December 2019.   03/28/2021 IMPRESSION: 1. Mild wall thickening with adjacent stranding involving the descending and sigmoid colon which may reflect mild colitis.  2. Prominence of the intra and extrahepatic biliary tree favored reservoir effect post cholecystectomy.  CA-125 results postchemotherapy 02/11/2015  10.4 03/12/2015  10.7 06/10/2015  10.0 03/15/2016   10.1 06/09/2016  9.9 09/14/2016  10.9 12/14/2016  9.8 02/17/17  12.1 03/20/2017  9.9 06/04/2017  11.4 09/13/2017  9.5 12/13/2017  8.9 03/14/2018 10.4 06/13/2018 9.2 09/10/2018 9.7 01/04/2019 10.2 05/06/2019 9.0 12/03/2019  10 09/11/20 8.6 05/26/21 9.2 11/15/21 8.6 10/10/2022         8.8 04/13/2023         8.9  Genetic testing- [employer] 83 gene Multi-Cancer Panel was negative for pathogenic mutation. She did have a VUS: SDHA c. 155C>T.   Screening mammogram - 12/12/2022 Screening Mammogram- Bi-rads category 1: negative   Problem List: Patient Active Problem List   Diagnosis Date Noted   Positive screening for depression on 9-item Patient Health Questionnaire (PHQ-9) 05/16/2023   External hemorrhoid 09/21/2022   IBS (irritable bowel syndrome) 09/21/2022   Genetic testing 05/26/2022   Routine general medical examination at a health care facility 04/25/2022   Orthopnea 12/30/2020   Lipid screening 06/21/2019   History of  anaphylaxis 06/21/2019   Screening mammogram, encounter for 01/07/2019   Encounter for follow-up surveillance of ovarian cancer 10/03/2018   Menopause 09/14/2016   Neoplasm, ovary, malignant, unspecified laterality (HCC) 12/09/2015   Acute sinusitis 08/11/2015   Status post laparoscopic hysterectomy 10/29/2014   Dysuria 01/02/2008   GERD (gastroesophageal reflux disease) 05/01/2007   PSORIASIS NEC 12/07/2006    Past Medical History: Past Medical History:  Diagnosis Date   Anxiety    Arthritis    Benign fundic gland polyps of stomach    Dizziness    vertigo   Gallstones    GERD (gastroesophageal reflux disease)    Ovarian cancer (HCC) 2016   Personal history of chemotherapy    Routine general medical examination at a health care facility 04/25/2022    Past Surgical History: Past Surgical History:  Procedure Laterality Date   CESAREAN SECTION     CHOLECYSTECTOMY     COLONOSCOPY     COLONOSCOPY WITH PROPOFOL N/A 04/29/2021   Procedure: COLONOSCOPY WITH PROPOFOL;  Surgeon: Wyline Mood, MD;  Location: [facility] ENDOSCOPY;  Service: Gastroenterology;  Laterality: N/A;   DIAGNOSTIC LAPAROSCOPY     LAPAROSCOPIC HYSTERECTOMY N/A 10/29/2014   Procedure: HYSTERECTOMY TOTAL LAPAROSCOPIC, OMENTUMECTOMY;  Surgeon: Artelia Laroche, MD;  Location: [facility] ORS;  Service: Gynecology;  Laterality: N/A;   LAPAROSCOPIC OOPHERECTOMY     OOPHORECTOMY  PORTA CATH REMOVAL N/A 04/26/2018   Procedure: PORTA CATH REMOVAL;  Surgeon: Annice Needy, MD;  Location: [facility] INVASIVE CV LAB;  Service: Cardiovascular;  Laterality: N/A;   UPPER GI ENDOSCOPY      Family History: Family History  Problem Relation Age of Onset   Hypertension Mother        9yo    Hyperlipidemia Mother    GER disease Mother    Heart disease Father        63yo   Breast cancer Sister 91   Heart disease Paternal Grandmother    Breast cancer Paternal Grandmother 56       deceased 54   Kidney disease Neg Hx    Liver disease  Neg Hx    Colon cancer Neg Hx    Colon polyps Neg Hx    Esophageal cancer Neg Hx    Pancreatic cancer Neg Hx     Social History: Social History   Socioeconomic History   Marital status: Divorced    Spouse name: Not on file   Number of children: 1   Years of education: Not on file   Highest education level: Bachelor's degree (e.g., BA, AB, BS)  Occupational History   Not on file  Tobacco Use   Smoking status: Former    Current packs/day: 0.00    Types: Cigarettes    Quit date: 04/05/1991    Years since quitting: 32.2   Smokeless tobacco: Never  Vaping Use   Vaping status: Never Used  Substance and Sexual Activity   Alcohol use: Yes    Alcohol/week: 0.0 standard drinks of alcohol    Comment: 1 drink of wine daily   Drug use: No   Sexual activity: Yes  Other Topics Concern   Not on file  Social History Narrative   Not on file   Social Drivers of Health   Financial Resource Strain: Low Risk  (05/15/2023)   Overall Financial Resource Strain (CARDIA)    Difficulty of Paying Living Expenses: Not very hard  Food Insecurity: No Food Insecurity (05/15/2023)   Hunger Vital Sign    Worried About Running Out of Food in the Last Year: Never true    Ran Out of Food in the Last Year: Never true  Transportation Needs: No Transportation Needs (05/15/2023)   PRAPARE - Administrator, Civil Service (Medical): No    Lack of Transportation (Non-Medical): No  Physical Activity: Insufficiently Active (05/15/2023)   Exercise Vital Sign    Days of Exercise per Week: 3 days    Minutes of Exercise per Session: 30 min  Stress: No Stress Concern Present (05/15/2023)   [employer] of Occupational Health - Occupational Stress Questionnaire    Feeling of Stress : Only a little  Social Connections: Moderately Integrated (05/15/2023)   Social Connection and Isolation Panel [NHANES]    Frequency of Communication with Friends and Family: More than three times a week    Frequency of  Social Gatherings with Friends and Family: Three times a week    Attends Religious Services: 1 to 4 times per year    Active Member of Clubs or Organizations: Yes    Attends Banker Meetings: 1 to 4 times per year    Marital Status: Divorced  Catering manager Violence: Not on file    Allergies: Allergies  Allergen Reactions   Ciprofloxacin Anaphylaxis   Paclitaxel Shortness Of Breath and Other (See Comments)    Other reaction(s): Tight chest (  finding) Chest and facial flushing Flushing, chest tightness, SOB w/ Taxol on 05/26/14 at [facility].   Flagyl [Metronidazole] Other (See Comments)    headache   Nutmeg Oil (Myristica Oil) Other (See Comments)    Tightness in chest and throat and difficulty swallowing and breathing.     Review of Systems General:  no complaints Skin: no complaints Eyes: no complaints HEENT: no complaints Breasts: no complaints Pulmonary: no complaints Cardiac: no complaints Gastrointestinal: stable complaints Genitourinary/Sexual: no complaints Ob/Gyn: Vulvar irritation and vaginal discharge Musculoskeletal: no complaints Hematology: no complaints Neurologic/Psych: no complaints   Objective:   Vitals:   06/21/23 1430  BP: 119/87  Pulse: 94  Resp: 20  Temp: 98.6 F (37 C)  SpO2: 99%     GENERAL: Patient is a well appearing female in no acute distress HEENT:  Atraumatic and normocephalic. PERRL, neck supple. NODES:  No cervical, supraclavicular, axillary, or inguinal lymphadenopathy palpated.  LUNGS: Normal respiratory effort ABDOMEN:  Soft, nontender. Nondistended. No masses/ascites/hernia/or hepatomegaly.  EXTREMITIES:  No peripheral edema.   SKIN:  Clear with no obvious rashes or skin changes. No nail dyscrasia. NEURO:  Nonfocal. Well oriented.  Appropriate affect.  Pelvic: EGBUS: no lesions involving the vulva. The area that bothers her is actually perianal extending from 7:00 to 12:00 Cervix: surgically  absent Vagina: persistent atrophic with some right vaginal fornix scattered small punctate erythematous tissue, smooth to palpation; o/w no lesions, no discharge or bleeding Uterus: surgically absent BME: no palpable masses Rectovaginal: deferred    Procedure Note: vaginal and vulvar biopsies  The risks and benefits of the procedure were reviewed and informed consent obtained. Time out was performed. The patient received pre-procedure teaching and expressed understanding. The post-procedure instructions were reviewed with the patient and she expressed understanding. The patient does not have any barriers to learning.  Informed Consent obtained. Time out performed. Area cleansed with Hibiclens. Biopsy performed at vaginal cuff just right of the midline at the apex. Hemostasis excellent with Monsels and AgNO3. Patient reassessed after procedure and in stable condition. No complications.  The perianal lesion was visible on as noted above. The area was numbed with hurricane jelly and injected with lidocaine. A biopsy forceps was used to obtained a biopsy at 9 o'clock.  Hemostasis was obtained with silver nitrate. She tolerated the procedure well.    Assessment:  Debbie Richardson is a 63 y.o. 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 No evidence of disease on CT or exam and CA125 normal in March 2019. Interval TLH/Omentectomy 7/16 negative for malignancy. Interval imaging significant for diverticular disease. Persistent pain worked up by GI and thought to be consistent with bacterial overgrowth syndrome and adhesive disease. Prior CT scans and PET all negative for recurrent disease. Port removed. Normal CA-125. NED on exam today.   Vulvar and vaginal biopsies obtained  Mild wall thickening with adjacent stranding involving the descending and sigmoid colon which may reflect mild colitis.  Plan:    Problem List Items Addressed This Visit       Endocrine   Neoplasm, ovary, malignant, unspecified laterality (HCC)     Other   Encounter for follow-up surveillance of ovarian cancer - Primary   Other Visit Diagnoses       Skin lesions       Relevant Orders   Surgical pathology   Surgical pathology  Vaginal lesion            CA 125 remains normal and patient is asymptomatic. Continue annual surveillance with CA 125.  Follow-up vulvar and vaginal biopsies.  She will return to clinic in 2 to 3 weeks to discuss those results.  Precautions provided and to contact us if needed for any gynecologic related concerns.   The patient's diagnosis, an outline of the further diagnostic and laboratory studies which will be required, the recommendation, and alternatives were discussed.  All questions were answered to the patient's satisfaction.  I personally had a face to face interaction and evaluated the patient. I have reviewed her history and available records and have performed the physical exam.  I have discussed the case with the patient.   Debbie Stanly Leta Jungling, MD   [facility] Leta Jungling, MD

## 2023-06-22 LAB — SURGICAL PATHOLOGY

## 2023-06-23 ENCOUNTER — Telehealth: Payer: Self-pay | Admitting: Nurse Practitioner

## 2023-06-23 ENCOUNTER — Telehealth: Payer: Self-pay | Admitting: *Deleted

## 2023-06-23 NOTE — Telephone Encounter (Signed)
 Spoke to patient re: her concerns. Sounds like expected post biopsy. Reviewed biopsy care instructions and sx that would be worrisome for complications and warrant in clinic evaluation.

## 2023-06-23 NOTE — Telephone Encounter (Signed)
 I went and spoke to Debbie Richardson and she is calling the pt.now. she has rusty color on the interior bx.

## 2023-06-27 ENCOUNTER — Telehealth: Payer: Self-pay

## 2023-06-27 NOTE — Telephone Encounter (Signed)
 Dr. Sonia Side has reviewed biopsy results. Results called to Ms. Bartolini.

## 2023-06-29 ENCOUNTER — Encounter: Payer: Self-pay | Admitting: Family Medicine

## 2023-06-29 ENCOUNTER — Ambulatory Visit (INDEPENDENT_AMBULATORY_CARE_PROVIDER_SITE_OTHER): Admitting: Family Medicine

## 2023-06-29 VITALS — BP 114/72 | HR 89 | Temp 98.6°F | Ht 63.0 in | Wt 138.2 lb

## 2023-06-29 DIAGNOSIS — E2839 Other primary ovarian failure: Secondary | ICD-10-CM

## 2023-06-29 DIAGNOSIS — C569 Malignant neoplasm of unspecified ovary: Secondary | ICD-10-CM

## 2023-06-29 DIAGNOSIS — Z13220 Encounter for screening for lipoid disorders: Secondary | ICD-10-CM

## 2023-06-29 DIAGNOSIS — Z0000 Encounter for general adult medical examination without abnormal findings: Secondary | ICD-10-CM

## 2023-06-29 DIAGNOSIS — Z1331 Encounter for screening for depression: Secondary | ICD-10-CM | POA: Diagnosis not present

## 2023-06-29 DIAGNOSIS — E538 Deficiency of other specified B group vitamins: Secondary | ICD-10-CM

## 2023-06-29 DIAGNOSIS — Z79899 Other long term (current) drug therapy: Secondary | ICD-10-CM

## 2023-06-29 DIAGNOSIS — Z8262 Family history of osteoporosis: Secondary | ICD-10-CM

## 2023-06-29 DIAGNOSIS — K219 Gastro-esophageal reflux disease without esophagitis: Secondary | ICD-10-CM | POA: Diagnosis not present

## 2023-06-29 DIAGNOSIS — E559 Vitamin D deficiency, unspecified: Secondary | ICD-10-CM

## 2023-06-29 LAB — CBC WITH DIFFERENTIAL/PLATELET
Basophils Absolute: 0 10*3/uL (ref 0.0–0.1)
Basophils Relative: 0.7 % (ref 0.0–3.0)
Eosinophils Absolute: 0 10*3/uL (ref 0.0–0.7)
Eosinophils Relative: 0.4 % (ref 0.0–5.0)
HCT: 39.5 % (ref 36.0–46.0)
Hemoglobin: 13.6 g/dL (ref 12.0–15.0)
Lymphocytes Relative: 28.8 % (ref 12.0–46.0)
Lymphs Abs: 1.3 10*3/uL (ref 0.7–4.0)
MCHC: 34.3 g/dL (ref 30.0–36.0)
MCV: 92.7 fl (ref 78.0–100.0)
Monocytes Absolute: 0.3 10*3/uL (ref 0.1–1.0)
Monocytes Relative: 6.9 % (ref 3.0–12.0)
Neutro Abs: 2.8 10*3/uL (ref 1.4–7.7)
Neutrophils Relative %: 63.2 % (ref 43.0–77.0)
Platelets: 223 10*3/uL (ref 150.0–400.0)
RBC: 4.26 Mil/uL (ref 3.87–5.11)
RDW: 13.2 % (ref 11.5–15.5)
WBC: 4.5 10*3/uL (ref 4.0–10.5)

## 2023-06-29 LAB — COMPREHENSIVE METABOLIC PANEL WITH GFR
ALT: 13 U/L (ref 0–35)
AST: 15 U/L (ref 0–37)
Albumin: 5 g/dL (ref 3.5–5.2)
Alkaline Phosphatase: 50 U/L (ref 39–117)
BUN: 11 mg/dL (ref 6–23)
CO2: 29 meq/L (ref 19–32)
Calcium: 9.4 mg/dL (ref 8.4–10.5)
Chloride: 102 meq/L (ref 96–112)
Creatinine, Ser: 0.64 mg/dL (ref 0.40–1.20)
GFR: 94.15 mL/min (ref >60.00)
Glucose, Bld: 87 mg/dL (ref 70–99)
Potassium: 3.9 meq/L (ref 3.5–5.1)
Sodium: 139 meq/L (ref 135–145)
Total Bilirubin: 0.9 mg/dL (ref 0.2–1.2)
Total Protein: 7.1 g/dL (ref 6.0–8.3)

## 2023-06-29 LAB — LIPID PANEL
Cholesterol: 230 mg/dL — ABNORMAL HIGH (ref 0–200)
HDL: 89.1 mg/dL (ref >39.00)
LDL Cholesterol: 128 mg/dL — ABNORMAL HIGH (ref 0–99)
NonHDL: 140.58
Total CHOL/HDL Ratio: 3
Triglycerides: 65 mg/dL (ref 0.0–149.0)
VLDL: 13 mg/dL (ref 0.0–40.0)

## 2023-06-29 NOTE — Assessment & Plan Note (Signed)
 Conitnues gyn/onc follow up  Doing well S/p hysterectomy

## 2023-06-29 NOTE — Assessment & Plan Note (Signed)
 Dexa ordered Early surgical menopause

## 2023-06-29 NOTE — Assessment & Plan Note (Signed)
 Much improved now that she is physically feeling better

## 2023-06-29 NOTE — Progress Notes (Signed)
 Subjective:    Patient ID: Debbie Richardson, female    DOB: 1960-11-02, 63 y.o.   MRN: [id number]  HPI  Here for health maintenance exam and to review chronic medical problems   Wt Readings from Last 3 Encounters:  06/29/23 138 lb 4 oz (62.7 kg)  05/16/23 138 lb 4 oz (62.7 kg)  11/23/22 140 lb 8 oz (63.7 kg)   24.49 kg/m  Vitals:   06/29/23 1101  BP: 114/72  Pulse: 89  Temp: 98.6 F (37 C)  SpO2: 100%    Immunization History  Administered Date(s) Administered   Influenza,inj,Quad PF,6+ Mos 05/25/2015   PFIZER(Purple Top)SARS-COV-2 Vaccination 07/12/2019, 02/07/2020   Td 04/04/1994   Tdap 08/19/2013    There are no preventive care reminders to display for this patient.  Struggled rebounding from her flu and sinus infection  Finally feeling better  Still feels drained   Also long work hours  Has considered early retirement    Shingrix vaccine - plans to take it in the future     Will be due for 10 y tetanus shot in may   Mammogram 12/2022  Self breast exam  Gyn health Had recent visit and exam with cancer specialist some peri anal and vaginal bx  Past history of ovarian cancer with adhesions / hysterectomy   Colon cancer screening  colonoscopy 04/2021 with 7 y recall Takes rifaximin for IBS prn    Bone health  Has not had dexa in years  Falls- none  Fractures-none  Supplements -wants to know what to take    Exercise  Limited by work    Eats pretty well     Mood    06/29/2023   11:05 AM 05/16/2023   10:39 AM 09/21/2022    4:16 PM 05/10/2022    8:06 AM 12/30/2020   11:37 AM  Depression screen PHQ 2/9  Decreased Interest 0 3 0 0 0  Down, Depressed, Hopeless 0 0 0 0 0  PHQ - 2 Score 0 3 0 0 0  Altered sleeping 1 3 1 1    Tired, decreased energy 0 3 1 0   Change in appetite 0 3 0 1   Feeling bad or failure about yourself  0 0 0 0   Trouble concentrating 0 3 0 0   Moving slowly or fidgety/restless 0 0 0 0   Suicidal thoughts 0 0 0 0    PHQ-9 Score 1 15 2 2    Difficult doing work/chores Not difficult at all Not difficult at all Not difficult at all Not difficult at all    Improved   GERD Protonix prn infrequently  Lipid screen Lab Results  Component Value Date   CHOL 230 (H) 06/29/2023   HDL 89.10 06/29/2023   LDLCALC 128 (H) 06/29/2023   TRIG 65.0 06/29/2023   CHOLHDL 3 06/29/2023   Due for labs     Patient Active Problem List   Diagnosis Date Noted   Vitamin D deficiency 06/30/2023   B12 deficiency 06/30/2023   Current use of proton pump inhibitor 06/29/2023   Estrogen deficiency 06/29/2023   Family history of osteoporosis 06/29/2023   External hemorrhoid 09/21/2022   IBS (irritable bowel syndrome) 09/21/2022   Genetic testing 05/26/2022   Routine general medical examination at a health care facility 04/25/2022   Orthopnea 12/30/2020   Lipid screening 06/21/2019   History of anaphylaxis 06/21/2019   Screening mammogram, encounter for 01/07/2019   Encounter for follow-up surveillance of ovarian cancer  10/03/2018   Menopause 09/14/2016   Neoplasm, ovary, malignant, unspecified laterality (HCC) 12/09/2015   Status post laparoscopic hysterectomy 10/29/2014   GERD (gastroesophageal reflux disease) 05/01/2007   PSORIASIS NEC 12/07/2006   Past Medical History:  Diagnosis Date   Anxiety    Arthritis    Benign fundic gland polyps of stomach    Dizziness    vertigo   Gallstones    GERD (gastroesophageal reflux disease)    Ovarian cancer (HCC) 2016   Personal history of chemotherapy    Routine general medical examination at a health care facility 04/25/2022   Past Surgical History:  Procedure Laterality Date   CESAREAN SECTION     CHOLECYSTECTOMY     COLONOSCOPY     COLONOSCOPY WITH PROPOFOL N/A 04/29/2021   Procedure: COLONOSCOPY WITH PROPOFOL;  Surgeon: Wyline Mood, MD;  Location: [facility] ENDOSCOPY;  Service: Gastroenterology;  Laterality: N/A;   DIAGNOSTIC LAPAROSCOPY     LAPAROSCOPIC  HYSTERECTOMY N/A 10/29/2014   Procedure: HYSTERECTOMY TOTAL LAPAROSCOPIC, OMENTUMECTOMY;  Surgeon: Artelia Laroche, MD;  Location: [facility] ORS;  Service: Gynecology;  Laterality: N/A;   LAPAROSCOPIC OOPHERECTOMY     OOPHORECTOMY     PORTA CATH REMOVAL N/A 04/26/2018   Procedure: PORTA CATH REMOVAL;  Surgeon: Annice Needy, MD;  Location: [facility] INVASIVE CV LAB;  Service: Cardiovascular;  Laterality: N/A;   UPPER GI ENDOSCOPY     Social History   Tobacco Use   Smoking status: Former    Current packs/day: 0.00    Types: Cigarettes    Quit date: 04/05/1991    Years since quitting: 32.2   Smokeless tobacco: Never  Vaping Use   Vaping status: Never Used  Substance Use Topics   Alcohol use: Yes    Alcohol/week: 0.0 standard drinks of alcohol    Comment: 1 drink of wine daily   Drug use: No   Family History  Problem Relation Age of Onset   Hypertension Mother        64yo    Hyperlipidemia Mother    GER disease Mother    Heart disease Father        33yo   Breast cancer Sister 5   Heart disease Paternal Grandmother    Breast cancer Paternal Grandmother 69       deceased 49   Kidney disease Neg Hx    Liver disease Neg Hx    Colon cancer Neg Hx    Colon polyps Neg Hx    Esophageal cancer Neg Hx    Pancreatic cancer Neg Hx    Allergies  Allergen Reactions   Ciprofloxacin Anaphylaxis   Paclitaxel Shortness Of Breath and Other (See Comments)    Other reaction(s): Tight chest (finding) Chest and facial flushing Flushing, chest tightness, SOB w/ Taxol on 05/26/14 at [facility].   Flagyl [Metronidazole] Other (See Comments)    headache   Nutmeg Oil (Myristica Oil) Other (See Comments)    Tightness in chest and throat and difficulty swallowing and breathing.   Current Outpatient Medications on File Prior to Visit  Medication Sig Dispense Refill   cholecalciferol (VITAMIN D3) 25 MCG (1000 UNIT) tablet Take 2,000 Units by mouth daily.     cyanocobalamin (VITAMIN B12) 1000  MCG tablet Take 1,000 mcg by mouth daily.     hydrocortisone (ANUSOL-HC) 2.5 % rectal cream Apply 1 Application topically 2 (two) times daily. For 1 week 30 g 0   pantoprazole (PROTONIX) 40 MG tablet TAKE 1 TABLET BY MOUTH  DAILY AS NEEDED 30 tablet 0   rifaximin (XIFAXAN) 550 MG TABS tablet TAKE 1 TABLET (550 MG TOTAL) BY MOUTH 2 (TWO) TIMES DAILY FOR 14 DAYS. (Patient taking differently: Take 550 mg by mouth daily as needed.) 28 tablet 0   No current facility-administered medications on file prior to visit.    Review of Systems  Constitutional:  Positive for fatigue. Negative for activity change, appetite change, fever and unexpected weight change.  HENT:  Negative for congestion, ear pain, rhinorrhea, sinus pressure and sore throat.   Eyes:  Negative for pain, redness and visual disturbance.  Respiratory:  Negative for cough, shortness of breath and wheezing.   Cardiovascular:  Negative for chest pain and palpitations.  Gastrointestinal:  Negative for abdominal pain, blood in stool, constipation and diarrhea.  Endocrine: Negative for polydipsia and polyuria.  Genitourinary:  Negative for dysuria, frequency and urgency.  Musculoskeletal:  Negative for arthralgias, back pain and myalgias.  Skin:  Negative for pallor and rash.  Allergic/Immunologic: Negative for environmental allergies.  Neurological:  Negative for dizziness, syncope and headaches.  Hematological:  Negative for adenopathy. Does not bruise/bleed easily.  Psychiatric/Behavioral:  Negative for decreased concentration and dysphoric mood. The patient is nervous/anxious.        Objective:   Physical Exam Constitutional:      General: She is not in acute distress.    Appearance: Normal appearance. She is well-developed and normal weight. She is not ill-appearing or diaphoretic.  HENT:     Head: Normocephalic and atraumatic.     Right Ear: Tympanic membrane, ear canal and external ear normal.     Left Ear: Tympanic membrane,  ear canal and external ear normal.     Nose: Nose normal. No congestion.     Mouth/Throat:     Mouth: Mucous membranes are moist.     Pharynx: Oropharynx is clear. No posterior oropharyngeal erythema.  Eyes:     General: No scleral icterus.    Extraocular Movements: Extraocular movements intact.     Conjunctiva/sclera: Conjunctivae normal.     Pupils: Pupils are equal, round, and reactive to light.  Neck:     Thyroid: No thyromegaly.     Vascular: No carotid bruit or JVD.  Cardiovascular:     Rate and Rhythm: Normal rate and regular rhythm.     Pulses: Normal pulses.     Heart sounds: Normal heart sounds.     No gallop.  Pulmonary:     Effort: Pulmonary effort is normal. No respiratory distress.     Breath sounds: Normal breath sounds. No wheezing.     Comments: Good air exch Chest:     Chest wall: No tenderness.  Abdominal:     General: Bowel sounds are normal. There is no distension or abdominal bruit.     Palpations: Abdomen is soft. There is no mass.     Tenderness: There is no abdominal tenderness.     Hernia: No hernia is present.  Genitourinary:    Comments: Breast exam done by gyn/onc provider recently Musculoskeletal:        General: No tenderness. Normal range of motion.     Cervical back: Normal range of motion and neck supple. No rigidity. No muscular tenderness.     Right lower leg: No edema.     Left lower leg: No edema.     Comments: No kyphosis   Lymphadenopathy:     Cervical: No cervical adenopathy.  Skin:    General: Skin is  warm and dry.     Coloration: Skin is not pale.     Findings: No erythema or rash.     Comments: Solar lentigines diffusely   Neurological:     Mental Status: She is alert. Mental status is at baseline.     Cranial Nerves: No cranial nerve deficit.     Motor: No abnormal muscle tone.     Coordination: Coordination normal.     Gait: Gait normal.     Deep Tendon Reflexes: Reflexes are normal and symmetric. Reflexes normal.   Psychiatric:        Mood and Affect: Mood normal.        Cognition and Memory: Cognition and memory normal.     Comments: Mildly anxious  Pleasant  Candidly discusses symptoms and stressors             Assessment & Plan:   Problem List Items Addressed This Visit       Digestive   GERD (gastroesophageal reflux disease)   Protonix 40 mg prn  Encouraged to avoid triggers         Endocrine   Neoplasm, ovary, malignant, unspecified laterality (HCC)   Conitnues gyn/onc follow up  Doing well S/p hysterectomy        Other   Vitamin D deficiency   New Last vitamin D Lab Results  Component Value Date   VD25OH 29.07 (L) 06/29/2023   Instructed to start 2000 international units D3 daily  Re check 2-3 mo      Routine general medical examination at a health care facility - Primary   Reviewed health habits including diet and exercise and skin cancer prevention Reviewed appropriate screening tests for age  Also reviewed health mt list, fam hx and immunization status , as well as social and family history   See HPI Labs reviewed and ordered Health Maintenance  Topic Date Due   Flu Shot  07/03/2023*   Zoster (Shingles) Vaccine (1 of 2) 09/29/2023*   COVID-19 Vaccine (3 - Pfizer risk series) 05/31/2024*   Hepatitis C Screening  06/20/2029*   HIV Screening  06/20/2029*   DTaP/Tdap/Td vaccine (3 - Td or Tdap) 08/20/2023   Mammogram  12/08/2024   Colon Cancer Screening  04/29/2028   HPV Vaccine  Aged Out  *Topic was postponed. The date shown is not the original due date.   Utd pap and gyn care with her onco/gyn for now  Considering shingrix vaccine Discussed all vaccines and encouraged to consider flu, covid , RSV vaccine  Tetanus due next time  Discussed fall prevention, supplements and exercise for bone density  Dexa ordered (pt had early menopause due to oophrectomy and has fam history of osteoporosis )   will see if ins covers  PHQ better at 1  Labs today       Relevant Orders   TSH (Completed)   Lipid Panel (Completed)   Comprehensive metabolic panel with GFR (Completed)   CBC with Differential/Platelet (Completed)   RESOLVED: Positive screening for depression on 9-item Patient Health Questionnaire (PHQ-9)   Much improved now that she is physically feeling better      Lipid screening   Disc goals for lipids and reasons to control them Rev last labs with pt Rev low sat fat diet in detail  Lab today  Diet is fairly good       Relevant Orders   Lipid Panel (Completed)   Family history of osteoporosis   Dexa ordered  Relevant Orders   DG Bone Density   Estrogen deficiency   Dexa ordered Early surgical menopause       Relevant Orders   DG Bone Density   Current use of proton pump inhibitor   Relevant Orders   VITAMIN D 25 Hydroxy (Vit-D Deficiency, Fractures) (Completed)   Vitamin B12 (Completed)   B12 deficiency   New Does take ppi prn  Lab Results  Component Value Date   VITAMINB12 180 (L) 06/29/2023   Instructed to return for shot  Take 1000 mcg daily

## 2023-06-29 NOTE — Assessment & Plan Note (Signed)
 Dexa ordered

## 2023-06-29 NOTE — Patient Instructions (Addendum)
 If you are interested in the shingles vaccine series (Shingrix), call your insurance or pharmacy to check on coverage and location it must be given.  If affordable - you can schedule it here or at your pharmacy depending on coverage   Make sure you take at least 2000 international units of vitamin D3 over the counter daily  If you diet is unbalanced a multi vitamin is a good idea   Take care of yourself    Exercise is the most important thing for quality of life  Add some strength training to your routine, this is important for bone and brain health and can reduce your risk of falls and help your body use insulin properly and regulate weight  Light weights, exercise bands , and internet videos are a good way to start  Yoga (chair or regular), machines , floor exercises or a gym with machines are also good options    Vaccinations are important Flu shot every fall  Covid shot once a year Consider a one time RSV vaccine   Pneumonia vaccine is usually given at 65 but you can get it early if you want    Labs today   You have an order for:  []   2D Mammogram  []   3D Mammogram  [x]   Bone Density     Please call for appointment:   []   [facility]  [address] [place] [address]  [phone]  [x]   [facility])   [address]  [place], [place] [address]  [phone]  []   [facility]      [address] [place], [place]        [phone]         []   [facility]  [address] [place], [place]  [phone]  []  [facility]   [address]   [place], [place] [address]  [phone]  []  [facility]  [address] [place], [place] [address] [phone]    Make sure to wear two piece clothing  No lotions powders or  deodorants the day of the appointment Make sure to bring picture ID and insurance card.  Bring list of medications you are currently taking including any supplements.   Schedule your screening mammogram through MyChart!   Select [place] imaging sites can now be scheduled through MyChart.  Log into your MyChart account.  Go to 'Visit' (or 'Appointments' if  on mobile App) --> Schedule an  Appointment  Under 'Select a Reason for Visit' choose the Mammogram  Screening option.  Complete the pre-visit questions  and select the time and place that  best fits your schedule

## 2023-06-29 NOTE — Assessment & Plan Note (Signed)
Disc goals for lipids and reasons to control them Rev last labs with pt Rev low sat fat diet in detail  Lab today  Diet is fairly good

## 2023-06-29 NOTE — Assessment & Plan Note (Signed)
 Reviewed health habits including diet and exercise and skin cancer prevention Reviewed appropriate screening tests for age  Also reviewed health mt list, fam hx and immunization status , as well as social and family history   See HPI Labs reviewed and ordered Health Maintenance  Topic Date Due   Flu Shot  07/03/2023*   Zoster (Shingles) Vaccine (1 of 2) 09/29/2023*   COVID-19 Vaccine (3 - Pfizer risk series) 05/31/2024*   Hepatitis C Screening  06/20/2029*   HIV Screening  06/20/2029*   DTaP/Tdap/Td vaccine (3 - Td or Tdap) 08/20/2023   Mammogram  12/08/2024   Colon Cancer Screening  04/29/2028   HPV Vaccine  Aged Out  *Topic was postponed. The date shown is not the original due date.   Utd pap and gyn care with her onco/gyn for now  Considering shingrix vaccine Discussed all vaccines and encouraged to consider flu, covid , RSV vaccine  Tetanus due next time  Discussed fall prevention, supplements and exercise for bone density  Dexa ordered (pt had early menopause due to oophrectomy and has fam history of osteoporosis )   will see if ins covers  PHQ better at 1  Labs today

## 2023-06-29 NOTE — Assessment & Plan Note (Signed)
 Protonix 40 mg prn  Encouraged to avoid triggers

## 2023-06-30 ENCOUNTER — Encounter: Payer: Self-pay | Admitting: Family Medicine

## 2023-06-30 DIAGNOSIS — E538 Deficiency of other specified B group vitamins: Secondary | ICD-10-CM | POA: Insufficient documentation

## 2023-06-30 DIAGNOSIS — E559 Vitamin D deficiency, unspecified: Secondary | ICD-10-CM | POA: Insufficient documentation

## 2023-06-30 LAB — VITAMIN B12: Vitamin B-12: 180 pg/mL — ABNORMAL LOW (ref 211–911)

## 2023-06-30 LAB — TSH: TSH: 1.48 u[IU]/mL (ref 0.35–5.50)

## 2023-06-30 LAB — VITAMIN D 25 HYDROXY (VIT D DEFICIENCY, FRACTURES): VITD: 29.07 ng/mL — ABNORMAL LOW (ref 30.00–100.00)

## 2023-06-30 NOTE — Addendum Note (Signed)
 Addended by: Roxy Manns A on: 06/30/2023 08:13 AM   Modules accepted: Orders

## 2023-06-30 NOTE — Assessment & Plan Note (Signed)
 New Last vitamin D Lab Results  Component Value Date   VD25OH 29.07 (L) 06/29/2023   Instructed to start 2000 international units D3 daily  Re check 2-3 mo

## 2023-06-30 NOTE — Assessment & Plan Note (Signed)
 New Does take ppi prn  Lab Results  Component Value Date   VITAMINB12 180 (L) 06/29/2023   Instructed to return for shot  Take 1000 mcg daily

## 2023-07-03 ENCOUNTER — Telehealth: Payer: Self-pay | Admitting: *Deleted

## 2023-07-03 ENCOUNTER — Ambulatory Visit: Admitting: Family Medicine

## 2023-07-03 ENCOUNTER — Ambulatory Visit: Payer: Self-pay | Admitting: Family Medicine

## 2023-07-03 ENCOUNTER — Encounter: Payer: Self-pay | Admitting: Family Medicine

## 2023-07-03 VITALS — BP 112/62 | HR 85 | Temp 98.8°F | Ht 63.0 in | Wt 139.6 lb

## 2023-07-03 DIAGNOSIS — R21 Rash and other nonspecific skin eruption: Secondary | ICD-10-CM | POA: Diagnosis not present

## 2023-07-03 MED ORDER — RIFAXIMIN 550 MG PO TABS: 550.0000 mg | ORAL_TABLET | Freq: Every day | ORAL | Status: DC | PRN

## 2023-07-03 MED ORDER — VALACYCLOVIR HCL 1 G PO TABS
1000.0000 mg | ORAL_TABLET | Freq: Three times a day (TID) | ORAL | 0 refills | Status: DC
Start: 2023-07-03 — End: 2023-08-29

## 2023-07-03 NOTE — Telephone Encounter (Signed)
 Copied from CRM [id number]. Topic: Clinical - Red Word Triage >> Jul 03, 2023  7:59 AM [name] wrote: Red Word that prompted transfer to Nurse Triage: Patient stated she started taking supplements last Friday and broke up out in a rash on her face. Stated she also had shingles in the exact spot before.   Chief Complaint: Rash Symptoms: Erythema, Pruritus, Swelling Frequency: Started yesterday Pertinent Negatives: Patient denies pain, fever Disposition: [] ED /[] Urgent Care (no appt availability in office) / [x] Appointment(In office/virtual)/ []  [facility]/ [] Home Care/ [] Refused Recommended Disposition /[] [place] [facility]/ []  Follow-up with PCP Additional Notes: CH is being triaged for an acute rash of unknown etiology. The rash started yesterday and appeared right under the patient's eye. The patient reports having taken an antihistamine and an Advil for this reaction, and only reports having taken B 12 previously. The patient also reports a history of shingles in the exact same spot previously. The patient reports this rash as a streaking rash that starts under her left eye and goes to her cheekbone. In office appointment made.   Reason for Disposition  [1] Red area or streak AND [2] fever  Answer Assessment - Initial Assessment Questions 1. APPEARANCE of RASH: "Describe the rash."      Really Reddened, slighty edematous  2. LOCATION: "Where is the rash located?"      Under Left Eye, Left Side of face.   3. NUMBER: "How many spots are there?"      1  4. SIZE: "How big are the spots?" (Inches, centimeters or compare to size of a coin)      Trailing, underneath the eye and going across the cheekbone  5. ONSET: "When did the rash start?"      Since Yesterday  6. ITCHING: "Does the rash itch?" If Yes, ask: "How bad is the itch?"  (Scale 0-10; or none, mild, moderate, severe)     Moderate  7. PAIN: "Does the rash hurt?" If Yes, ask: "How bad is the pain?"  (Scale 0-10;  or none, mild, moderate, severe)    - NONE (0): no pain    - MILD (1-3): doesn't interfere with normal activities     - MODERATE (4-7): interferes with normal activities or awakens from sleep     - SEVERE (8-10): excruciating pain, unable to do any normal activities     None  8. OTHER SYMPTOMS: "Do you have any other symptoms?" (e.g., fever)     No  9. PREGNANCY: "Is there any chance you are pregnant?" "When was your last menstrual period?"     No and No  Protocols used: Rash or Redness - Localized-A-AH

## 2023-07-03 NOTE — Patient Instructions (Signed)
 Start valtrex and call the eye clinic about getting checked.  Possible shingles.   Don't change your meds otherwise.   Take care.  Glad to see you.

## 2023-07-03 NOTE — Telephone Encounter (Addendum)
 Pt viewed lab results via mychart.  Per Dr. Milinda Antis pt just needs one b12 shot (once) and then   Labs in 2-3 months to check vitamin B12 and D levels (non fasting)  Please schedule

## 2023-07-03 NOTE — Progress Notes (Unsigned)
 Recent started taking B12, not yet on extra vitamin D.   R facial rash.  H/o shingles 2 years ago, in similar area.  It itches.  No clear trigger or trauma.  Progressive enlargement.  No ear pain.  Tingling in the area, in L V1-3.  Doesn't cross the midline.  Sees [facility].    She had a nonitchy pinkish area near the L elbow.  This doesn't feel similar to the facial lesion.  No FCNAVD.    Meds, vitals, and allergies reviewed.   ROS: Per HPI unless specifically indicated in ROS section   Nad Ncat with blanching R sided V2 rash.  No vesicles. Does not cross the midline. MMM Neck supple no LA TM and ear canals unremarkable bilaterally.

## 2023-07-04 DIAGNOSIS — B0239 Other herpes zoster eye disease: Secondary | ICD-10-CM | POA: Diagnosis not present

## 2023-07-04 NOTE — Telephone Encounter (Signed)
 Understood Labs are ordered

## 2023-07-04 NOTE — Telephone Encounter (Signed)
 Has been taking b12 supplement, would like to wait til shingles is over and then go back to supplement

## 2023-07-05 DIAGNOSIS — R21 Rash and other nonspecific skin eruption: Secondary | ICD-10-CM | POA: Insufficient documentation

## 2023-07-05 NOTE — Assessment & Plan Note (Signed)
 Concern for shingles.  Discussed with patient.  She can start valtrex and call the eye clinic about getting checked.  Routine cautions given to patient.  She agrees to plan. Discussed routine vaccination.

## 2023-07-19 ENCOUNTER — Telehealth: Payer: Self-pay | Admitting: Obstetrics and Gynecology

## 2023-07-19 NOTE — Telephone Encounter (Signed)
 I called Debbie Richardson with her results noted below. There was no answer so I left an identified-patient voice message. I recommended that she discuss with her gynecologist or I can refer to Dr. Marlyse Single at [facility]. 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   FINAL DIAGNOSIS       1. Perianus, 9 o'clock perianal biopsy :      - SPONGIOSIS, HYPERKERATOSIS, AND FOCAL PARAKERATOSIS.      - GMS STAIN IS NEGATIVE.      - NEGATIVE FOR MALIGNANCY.      - SEE NOTE.       2. Vagina, biopsy, vaginal apex biopsy :      - POORLY PRESERVED AND LARGELY DENUDED INFLAMED SQUAMOUS MUCOSA WITH FOCAL      LICHOID PATTERN OF INFLAMMATION.      - GMS STAIN IS NEGATIVE.      - NEGATIVE FOR MALIGNANCY.      - DEEPER SECTIONS WERE EXAMINED.       Diagnosis Note : The differential diagnosis for the perianal biopsy includes      lichen simplex chronicus.Stain controls worked appropriately.   Debbie Laverdiere Iola Manila, MD

## 2023-08-02 ENCOUNTER — Inpatient Hospital Stay: Attending: Obstetrics and Gynecology | Admitting: Obstetrics and Gynecology

## 2023-08-02 VITALS — BP 122/87 | HR 88 | Temp 97.8°F | Resp 20 | Wt 140.4 lb

## 2023-08-02 DIAGNOSIS — L280 Lichen simplex chronicus: Secondary | ICD-10-CM

## 2023-08-02 NOTE — Patient Instructions (Signed)
Please consider healthy vulval hygiene practices as noted below.     Avoid   Pantyhose   Synthetic underwear  Jeans and other tight pants  Swimsuits, leotards, thongs, lycra garments  Pantyliners  Scented soaps or shampoos  Bubble bath   Scented detergents   Washcloths  Feminine sprays, douches, powders   Dyed toilet articles   Hair dryers to dry vulva skin without contact       Substitute   Stockings with a garter belt  Thigh-high or knee-high stockings  Cotton underwear or no underwear  Loose pants, skirts, dresses  Loose-fitting cotton garments  Tampons or cotton pads  Fragrance-free pH neutral soap (eg, Basis, Neutrogena, Dove soap)  Tub baths in the morning and at night without additives and at a comfortable temperature Unscented detergents Use fingertips for washing; pat dry, don't rub dry  These are not necessary products and can be omitted from personal practices Toilet articles without dyes Dry vulva by gentle patting

## 2023-08-02 NOTE — Progress Notes (Signed)
 Gynecologic Oncology Interval Note  Referring Provider: Dr. 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 She is on control arm of GOG225 and completed the active portion of the study on 03/15/17.   She presents to discuss her findings from the biopsies  FINAL DIAGNOSIS       1. Perianus, 9 o'clock perianal biopsy :      - SPONGIOSIS, HYPERKERATOSIS, AND FOCAL PARAKERATOSIS.      - GMS STAIN IS NEGATIVE.      - NEGATIVE FOR MALIGNANCY.      - SEE NOTE.       2. Vagina, biopsy, vaginal apex biopsy :      - POORLY PRESERVED AND LARGELY DENUDED INFLAMED SQUAMOUS MUCOSA WITH FOCAL      LICHOID PATTERN OF INFLAMMATION.      - GMS STAIN IS NEGATIVE.      - NEGATIVE FOR MALIGNANCY.      - DEEPER SECTIONS WERE EXAMINED.       Diagnosis Note : The differential diagnosis for the perianal biopsy includes      lichen simplex chronicus.Stain controls worked appropriately.    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 See prior notes for complete details. She was taken emergently to the operating room on 04/28/2014 by Dr. Cleora Daft and a laparoscopic procedure was performed.  Bilateral salpingo-oophorectomy with  placement of the cystic lesions in the Endo Catch bag and removal through the LLQ port. Per the operative note the right ovary did rupture and contents of the cysts were spilled into the  pelvis.  The final pathology revealed high-grade serous adenocarcinoma involving both ovaries and fallopian tubes. Washings also revealed clusters of highly atypical glandular cells and surface involvement of the ovary was present. Staging was felt to be stage II at least high grade serous ovarian cancer. Preoperative CA-125 was 45.   She received 6 cycles of carboplatin /taxane chemotherapy via an IV port, last treatment June 2016.  She had an allergy to taxol and was switched to taxotere  for the last 4 cycles. She underwent interval TLH/omentectomy on 10/29/2014 for ovarian cancer. Her final pathology was negative. She enrolled on GOG225.   CT scan 01/09/2015 negative  CT scan 06/24/2015 IMPRESSION: No evidence of urolithiasis, hydronephrosis, or other acute Findings.  Colonic diverticulosis. No radiographic evidence of diverticulitis.  CT scan of abdomen and pelvis 12/09/2015 IMPRESSION: Status post hysterectomy and bilateral salpingo-oophorectomy. No evidence of recurrent or metastatic disease  PET scan 09/19/2016 IMPRESSION: Negative PET-CT.  No findings for hypermetabolic recurrent tumor.  CT scan of abdomen and pelvis 02/27/2017  IMPRESSION: 1. No acute findings are noted in the abdomen or pelvis to account for the patient's symptoms. Notably, there is a low stool burden despite the patient's reported history of constipation. 2. Colonic diverticulosis without evidence of acute diverticulitis at this time. 3. Additional incidental  findings, as above.   She had menopausal symptoms and we discussed Effexor therapy, but she did not pursue. She has used magnesium and felt that improved her symptoms.   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 Lenton Rail, GI. She has been on multiple antibiotics and Xifaxan  with complete resolution of symptoms. But recurrent symptoms.    09/22/2017 CT scan A/P  IMPRESSION: 1. Uterus  and ovaries absent. No pelvic mass. No adenopathy. No a mental lesions. No liver lesions evident. 2. No appreciable bowel wall thickening or bowel obstruction. There are occasional sigmoid diverticula without diverticulitis. No abscess in the abdomen or pelvis. Appendix appears normal. 3. No evident renal or ureteral calculus on either side. No hydronephrosis.   12/13/2017 She saw Dr. Valentine Gasmen on  with a negative exam and CA 125 8.9.  03/06/2018  She saw Dr. Lenton Rail, GI, who recommended Suggest trial of activated charcoal tablets and LOW FODMAP diet for Small Intestinal Bacterial Overgrowth. Her symptoms have continued to worsen and she is really miserable.   03/14/2018.  Negative exam and normal Ca125 however, GI symptoms concerning for recurrence versus persistent small intestinal bacterial overgrowth syndrome. CT Abdomen Pelvis on 03/22/2018 showed no acute abdominal/pelvic findings, masses, or adenopathy. No worrisome peritoneal or omental implants.   Port was removed by Dr. Vonna Guardian on 04/26/2018.    seen in July 2020 with negative exam and normal CA 125. GI symptoms at that time were improved. She has history of small intestinal bacterial overgrowth syndrome with previous negative imaging in December 2019.   03/28/2021 IMPRESSION: 1. Mild wall thickening with adjacent stranding involving the descending and sigmoid colon which may reflect mild colitis.  2. Prominence of the intra and extrahepatic biliary tree favored reservoir effect post cholecystectomy.  CA-125 results postchemotherapy 02/11/2015  10.4 03/12/2015  10.7 06/10/2015  10.0 03/15/2016   10.1 06/09/2016  9.9 09/14/2016  10.9 12/14/2016  9.8 02/17/17  12.1 03/20/2017  9.9 06/04/2017  11.4 09/13/2017  9.5 12/13/2017  8.9 03/14/2018 10.4 06/13/2018 9.2 09/10/2018 9.7 01/04/2019 10.2 05/06/2019 9.0 12/03/2019  10 09/11/20 8.6 05/26/21 9.2 11/15/21 8.6 10/10/2022         8.8 04/13/2023         8.9  Genetic testing- [employer] 83 gene  Multi-Cancer Panel was negative for pathogenic mutation. She did have a VUS: SDHA c. 155C>T.   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   Rash 07/05/2023   Vitamin D  deficiency 06/30/2023   B12 deficiency 06/30/2023   Current use of proton pump inhibitor 06/29/2023   Estrogen deficiency 06/29/2023   Family history of osteoporosis 06/29/2023   External hemorrhoid 09/21/2022   IBS (irritable bowel syndrome) 09/21/2022   Genetic testing 05/26/2022   Routine general medical examination at a health care facility 04/25/2022   Orthopnea 12/30/2020   Lipid screening 06/21/2019   History of anaphylaxis 06/21/2019   Screening mammogram, encounter for 01/07/2019   Encounter for follow-up surveillance of ovarian cancer 10/03/2018   Menopause 09/14/2016   Neoplasm, ovary, malignant, unspecified laterality (HCC) 12/09/2015   Status post laparoscopic hysterectomy 10/29/2014   GERD (gastroesophageal reflux disease) 05/01/2007   PSORIASIS NEC 12/07/2006    Past Medical History: Past Medical History:  Diagnosis Date   Anxiety    Arthritis    Benign fundic  gland polyps of stomach    Dizziness    vertigo   Gallstones    GERD (gastroesophageal reflux disease)    Ovarian cancer (HCC) 2016   Personal history of chemotherapy    Routine general medical examination at a health care facility 04/25/2022    Past Surgical History: Past Surgical History:  Procedure Laterality Date   CESAREAN SECTION     CHOLECYSTECTOMY     COLONOSCOPY     COLONOSCOPY WITH PROPOFOL  N/A 04/29/2021   Procedure: COLONOSCOPY WITH PROPOFOL ;  Surgeon: Luke Salaam, MD;  Location: [facility] ENDOSCOPY;  Service: Gastroenterology;  Laterality: N/A;   DIAGNOSTIC LAPAROSCOPY     LAPAROSCOPIC HYSTERECTOMY N/A 10/29/2014   Procedure: HYSTERECTOMY TOTAL LAPAROSCOPIC, OMENTUMECTOMY;   Surgeon: Nobie Batch, MD;  Location: [facility] ORS;  Service: Gynecology;  Laterality: N/A;   LAPAROSCOPIC OOPHERECTOMY     OOPHORECTOMY     PORTA CATH REMOVAL N/A 04/26/2018   Procedure: PORTA CATH REMOVAL;  Surgeon: Celso College, MD;  Location: [facility] INVASIVE CV LAB;  Service: Cardiovascular;  Laterality: N/A;   UPPER GI ENDOSCOPY      Family History: Family History  Problem Relation Age of Onset   Hypertension Mother        29yo    Hyperlipidemia Mother    GER disease Mother    Heart disease Father        65yo   Breast cancer Sister 64   Heart disease Paternal Grandmother    Breast cancer Paternal Grandmother 6       deceased 83   Kidney disease Neg Hx    Liver disease Neg Hx    Colon cancer Neg Hx    Colon polyps Neg Hx    Esophageal cancer Neg Hx    Pancreatic cancer Neg Hx     Social History: Social History   Socioeconomic History   Marital status: Divorced    Spouse name: Not on file   Number of children: 1   Years of education: Not on file   Highest education level: Bachelor's degree (e.g., BA, AB, BS)  Occupational History   Not on file  Tobacco Use   Smoking status: Former    Current packs/day: 0.00    Types: Cigarettes    Quit date: 04/05/1991    Years since quitting: 32.3   Smokeless tobacco: Never  Vaping Use   Vaping status: Never Used  Substance and Sexual Activity   Alcohol use: Yes    Alcohol/week: 0.0 standard drinks of alcohol    Comment: 1 drink of wine daily   Drug use: No   Sexual activity: Yes  Other Topics Concern   Not on file  Social History Narrative   Not on file   Social Drivers of Health   Financial Resource Strain: Low Risk  (05/15/2023)   Overall Financial Resource Strain (CARDIA)    Difficulty of Paying Living Expenses: Not very hard  Food Insecurity: No Food Insecurity (05/15/2023)   Hunger Vital Sign    Worried About Running Out of Food in the Last Year: Never true    Ran Out of Food in the Last Year: Never true   Transportation Needs: No Transportation Needs (05/15/2023)   PRAPARE - Administrator, Civil Service (Medical): No    Lack of Transportation (Non-Medical): No  Physical Activity: Insufficiently Active (05/15/2023)   Exercise Vital Sign    Days of Exercise per Week: 3 days    Minutes of Exercise per Session:  30 min  Stress: No Stress Concern Present (05/15/2023)   [employer] of Occupational Health - Occupational Stress Questionnaire    Feeling of Stress : Only a little  Social Connections: Moderately Integrated (05/15/2023)   Social Connection and Isolation Panel [NHANES]    Frequency of Communication with Friends and Family: More than three times a week    Frequency of Social Gatherings with Friends and Family: Three times a week    Attends Religious Services: 1 to 4 times per year    Active Member of Clubs or Organizations: Yes    Attends Banker Meetings: 1 to 4 times per year    Marital Status: Divorced  Catering manager Violence: Not on file    Allergies: Allergies  Allergen Reactions   Ciprofloxacin  Anaphylaxis   Paclitaxel Shortness Of Breath and Other (See Comments)    Other reaction(s): Tight chest (finding) Chest and facial flushing Flushing, chest tightness, SOB w/ Taxol on 05/26/14 at [facility].   Flagyl  [Metronidazole ] Other (See Comments)    headache   Nutmeg Oil (Myristica Oil) Other (See Comments)    Tightness in chest and throat and difficulty swallowing and breathing.     Review of Systems General:  no complaints Skin: no complaints Eyes: no complaints HEENT: no complaints Breasts: no complaints Pulmonary: no complaints Cardiac: no complaints Gastrointestinal: stable complaints Genitourinary/Sexual: no complaints Ob/Gyn: no complaints Musculoskeletal: no complaints Hematology: no complaints Neurologic/Psych: no complaints   Objective:   Vitals:   08/02/23 1428  BP: 122/87  Pulse: 88  Resp: 20  Temp: 97.8 F  (36.6 C)  SpO2: 100%    Deferred Pelvic: EGBUS: no lesions involving the vulva. The area that bothers her is actually perianal extending from 7:00 to 12:00 Cervix: surgically absent Vagina: persistent atrophic with some right vaginal fornix scattered small punctate erythematous tissue, smooth to palpation; o/w no lesions, no discharge or bleeding Uterus: surgically absent BME: no palpable masses Rectovaginal: deferred   Assessment:  Debbie Richardson is a 63 y.o. 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 No evidence of disease on CT or exam and CA125 normal in March 2019. Interval TLH/Omentectomy 7/16 negative for malignancy. Interval imaging significant for diverticular disease. Persistent pain worked up by GI and thought to be consistent with bacterial overgrowth syndrome and adhesive disease. Prior CT scans and PET all negative for recurrent disease. Port removed. Normal CA-125. NED on exam today.   Vulvar and vaginal biopsies, benign possible lichen simplex chronicus  Mild wall thickening with adjacent stranding involving the descending and sigmoid colon which may reflect mild colitis.  Plan:   Problem List Items Addressed This Visit       Musculoskeletal and Integument   Lichen simplex chronicus - Primary    We discussed the pathology results.  She is asymptomatic at this time so I do not think that further therapy is warranted.  I offered referral to Dr. Marlyse [facility] at [facility]. Lichen simplex can be treated with topical steroids if she becomes symptomatic.   CA 125 was normal in jan 2025. Continue annual surveillance with CA 125.    Precautions provided and to contact us  if needed for any gynecologic related concerns.   The patient's diagnosis, an outline of the further diagnostic and laboratory studies which will be required, the recommendation, and alternatives were discussed.   All questions were answered to  the patient's satisfaction.  I personally had a face to face interaction and evaluated the patient. I have reviewed her history and available records and have performed the physical exam.  I have discussed the case with the patient.   Electra Paladino Iola Manila, MD

## 2023-08-29 ENCOUNTER — Ambulatory Visit: Payer: Self-pay

## 2023-08-29 ENCOUNTER — Ambulatory Visit: Admitting: Family Medicine

## 2023-08-29 ENCOUNTER — Encounter: Payer: Self-pay | Admitting: Family Medicine

## 2023-08-29 ENCOUNTER — Ambulatory Visit: Payer: Self-pay | Admitting: Family Medicine

## 2023-08-29 ENCOUNTER — Ambulatory Visit (INDEPENDENT_AMBULATORY_CARE_PROVIDER_SITE_OTHER)
Admission: RE | Admit: 2023-08-29 | Discharge: 2023-08-29 | Disposition: A | Discharge status: Home / Self Care | Source: Ambulatory Visit | Attending: Family Medicine | Admitting: Family Medicine

## 2023-08-29 VITALS — BP 110/80 | HR 68 | Temp 98.2°F | Ht 63.0 in | Wt 141.0 lb

## 2023-08-29 DIAGNOSIS — M79672 Pain in left foot: Secondary | ICD-10-CM | POA: Insufficient documentation

## 2023-08-29 DIAGNOSIS — Z23 Encounter for immunization: Secondary | ICD-10-CM | POA: Diagnosis not present

## 2023-08-29 DIAGNOSIS — M7742 Metatarsalgia, left foot: Secondary | ICD-10-CM | POA: Diagnosis not present

## 2023-08-29 NOTE — Addendum Note (Signed)
 Addended by: Wyn Heater on: 08/29/2023 11:19 AM   Modules accepted: Orders

## 2023-08-29 NOTE — Assessment & Plan Note (Signed)
 Acute, focal tenderness to palpation over fifth metatarsal head and base of fifth toe. No palpated or visual foreign body. Possible fracture given stubbed toe.  Will evaluate with x-ray. Patient can use Advil  as needed for pain and inflammation as well as ice. Limit prolonged weightbearing until x-rays return. We did discuss how x-ray will be limited in localizing foreign body as glass and wood cannot be seen.  Return and ER precautions provided. No current infection seen and signs and symptoms of infection reviewed with patient

## 2023-08-29 NOTE — Telephone Encounter (Signed)
 Copied from CRM [id number]. Topic: Clinical - Red Word Triage >> Aug 29, 2023  9:38 AM Dorthula Gavel H wrote: Red Word that prompted transfer to Nurse Triage: pt states she has stepped on something and has no idea what it is but it is something in her foot.    Chief Complaint: Left foot pain Symptoms: pain Frequency: Sunday Pertinent Negatives: Patient denies  Disposition: [] ED /[] Urgent Care (no appt availability in office) / [x] Appointment(In office/virtual)/ []  [facility]/ [] Home Care/ [] Refused Recommended Disposition /[] [facility]/ []  Follow-up with PCP Additional Notes: agrees with appointment.  Reason for Disposition  [1] SEVERE pain (e.g., excruciating, unable to do any normal activities) AND [2] not improved after 2 hours of pain medicine  Answer Assessment - Initial Assessment Questions 1. ONSET: "When did the pain start?"      Sunday 2. LOCATION: "Where is the pain located?"      Left, ball of foot 3. PAIN: "How bad is the pain?"    (Scale 1-10; or mild, moderate, severe)  - MILD (1-3): doesn't interfere with normal activities.   - MODERATE (4-7): interferes with normal activities (e.g., work or school) or awakens from sleep, limping.   - SEVERE (8-10): excruciating pain, unable to do any normal activities, unable to walk.      Moderate 4. WORK OR EXERCISE: "Has there been any recent work or exercise that involved this part of the body?"      no 5. CAUSE: "What do you think is causing the foot pain?"     unsure 6. OTHER SYMPTOMS: "Do you have any other symptoms?" (e.g., leg pain, rash, fever, numbness)     no 7. PREGNANCY: "Is there any chance you are pregnant?" "When was your last menstrual period?"     no  Protocols used: Foot Pain-A-AH

## 2023-08-29 NOTE — Telephone Encounter (Signed)
 Seen by Dr. Cherlyn Cornet today at 10:40 am.

## 2023-08-29 NOTE — Progress Notes (Signed)
 Patient ID: Debbie Richardson, female    DOB: [birth year], 63 y.o.   MRN: [id number]  This visit was conducted in person.  BP 110/80   Pulse 68   Temp 98.2 F (36.8 C) (Temporal)   Ht 5\' 3"  (1.6 m)   Wt 141 lb (64 kg)   LMP 03/30/2013   SpO2 99%   BMI 24.98 kg/m    CC:  Chief Complaint  Patient presents with   Foot Pain    Left    Subjective:   HPI: Debbie Richardson is a 63 y.o. female  patietn of Dr. Belva Boyden presenting on 08/29/2023 for Foot Pain (Left)  New sudden onset pain in left foot.  Woke her up at night from sleep.  Np known injury but walks barefoot.  Felt swollen in left sole of foot, slightly red.  Active doing yard work, walking.   She has soaked in soapy warm water.     1 week ago  hit tip of  pinky to on coach... Has been sore and painful since   Did similar 01/2023 hurt for weeks, got better.   No warmth, no heat.   Has taken Advil  200 mg last night for pain.   Relevant past medical, surgical, family and social history reviewed and updated as indicated. Interim medical history since our last visit reviewed. Allergies and medications reviewed and updated. Outpatient Medications Prior to Visit  Medication Sig Dispense Refill   cholecalciferol (VITAMIN D3) 25 MCG (1000 UNIT) tablet Take 2,000 Units by mouth daily.     cyanocobalamin  (VITAMIN B12) 1000 MCG tablet Take 1,000 mcg by mouth daily.     hydrocortisone  (ANUSOL -HC) 2.5 % rectal cream Apply 1 Application topically 2 (two) times daily. For 1 week 30 g 0   pantoprazole  (PROTONIX ) 40 MG tablet TAKE 1 TABLET BY MOUTH DAILY AS NEEDED 30 tablet 0   rifaximin  (XIFAXAN ) 550 MG TABS tablet Take 1 tablet (550 mg total) by mouth daily as needed. (Patient not taking: Reported on 08/02/2023)     valACYclovir  (VALTREX ) 1000 MG tablet Take 1 tablet (1,000 mg total) by mouth 3 (three) times daily. (Patient not taking: Reported on 08/02/2023) 21 tablet 0   No facility-administered medications prior to visit.      Per HPI unless specifically indicated in ROS section below Review of Systems  Constitutional:  Negative for fatigue and fever.  HENT:  Negative for ear pain.   Eyes:  Negative for pain.  Respiratory:  Negative for chest tightness and shortness of breath.   Cardiovascular:  Negative for chest pain, palpitations and leg swelling.  Gastrointestinal:  Negative for abdominal pain.  Genitourinary:  Negative for dysuria.   Objective:  BP 110/80   Pulse 68   Temp 98.2 F (36.8 C) (Temporal)   Ht 5\' 3"  (1.6 m)   Wt 141 lb (64 kg)   LMP 03/30/2013   SpO2 99%   BMI 24.98 kg/m   Wt Readings from Last 3 Encounters:  08/29/23 141 lb (64 kg)  08/02/23 140 lb 6.4 oz (63.7 kg)  07/03/23 139 lb 9.6 oz (63.3 kg)      Physical Exam Constitutional:      General: She is not in acute distress.    Appearance: Normal appearance. She is well-developed. She is not ill-appearing or toxic-appearing.  HENT:     Head: Normocephalic.     Right Ear: Hearing, tympanic membrane, ear canal and external ear normal. Tympanic membrane is not erythematous,  retracted or bulging.     Left Ear: Hearing, tympanic membrane, ear canal and external ear normal. Tympanic membrane is not erythematous, retracted or bulging.     Nose: No mucosal edema or rhinorrhea.     Right Sinus: No maxillary sinus tenderness or frontal sinus tenderness.     Left Sinus: No maxillary sinus tenderness or frontal sinus tenderness.     Mouth/Throat:     Pharynx: Uvula midline.  Eyes:     General: Lids are normal. Lids are everted, no foreign bodies appreciated.     Conjunctiva/sclera: Conjunctivae normal.     Pupils: Pupils are equal, round, and reactive to light.  Neck:     Thyroid: No thyroid mass or thyromegaly.     Vascular: No carotid bruit.     Trachea: Trachea normal.  Cardiovascular:     Rate and Rhythm: Normal rate and regular rhythm.     Pulses: Normal pulses.          Dorsalis pedis pulses are 2+ on the right side  and 2+ on the left side.     Heart sounds: Normal heart sounds, S1 normal and S2 normal. No murmur heard.    No friction rub. No gallop.  Pulmonary:     Effort: Pulmonary effort is normal. No tachypnea or respiratory distress.     Breath sounds: Normal breath sounds. No decreased breath sounds, wheezing, rhonchi or rales.  Abdominal:     General: Bowel sounds are normal.     Palpations: Abdomen is soft.     Tenderness: There is no abdominal tenderness.  Musculoskeletal:     Cervical back: Normal range of motion and neck supple.     Right foot: Normal range of motion. No deformity or bunion.     Left foot: Normal range of motion. No deformity or bunion.       Feet:  Feet:     Right foot:     Skin integrity: Skin integrity normal. No ulcer, blister, skin breakdown, erythema, warmth, callus, dry skin or fissure.     Toenail Condition: Right toenails are normal.     Left foot:     Skin integrity: Skin integrity normal. No ulcer, blister, skin breakdown, erythema, warmth, callus, dry skin or fissure.     Toenail Condition: Left toenails are normal.     Comments: Ttp over fifth metatarsal head and proximal fifth phalange  Skin:    General: Skin is warm and dry.     Findings: No rash.  Neurological:     Mental Status: She is alert.  Psychiatric:        Mood and Affect: Mood is not anxious or depressed.        Speech: Speech normal.        Behavior: Behavior normal. Behavior is cooperative.        Thought Content: Thought content normal.        Judgment: Judgment normal.       Results for orders placed or performed in visit on 06/29/23  TSH   Collection Time: 06/29/23 11:42 AM  Result Value Ref Range   TSH 1.48 0.35 - 5.50 uIU/mL  Lipid Panel   Collection Time: 06/29/23 11:42 AM  Result Value Ref Range   Cholesterol 230 (H) 0 - 200 mg/dL   Triglycerides 16.1 0.0 - 149.0 mg/dL   HDL 09.60 >45.40 mg/dL   VLDL 98.1 0.0 - 19.1 mg/dL   LDL Cholesterol 478 (H) 0 - 99 mg/dL  Total CHOL/HDL Ratio 3    NonHDL 140.58   Comprehensive metabolic panel with GFR   Collection Time: 06/29/23 11:42 AM  Result Value Ref Range   Sodium 139 135 - 145 mEq/L   Potassium 3.9 3.5 - 5.1 mEq/L   Chloride 102 96 - 112 mEq/L   CO2 29 19 - 32 mEq/L   Glucose, Bld 87 70 - 99 mg/dL   BUN 11 6 - 23 mg/dL   Creatinine, Ser 5.63 0.40 - 1.20 mg/dL   Total Bilirubin 0.9 0.2 - 1.2 mg/dL   Alkaline Phosphatase 50 39 - 117 U/L   AST 15 0 - 37 U/L   ALT 13 0 - 35 U/L   Total Protein 7.1 6.0 - 8.3 g/dL   Albumin 5.0 3.5 - 5.2 g/dL   GFR 87.56 >43.32 mL/min   Calcium 9.4 8.4 - 10.5 mg/dL  CBC with Differential/Platelet   Collection Time: 06/29/23 11:42 AM  Result Value Ref Range   WBC 4.5 4.0 - 10.5 K/uL   RBC 4.26 3.87 - 5.11 Mil/uL   Hemoglobin 13.6 12.0 - 15.0 g/dL   HCT 95.1 88.4 - 16.6 %   MCV 92.7 78.0 - 100.0 fl   MCHC 34.3 30.0 - 36.0 g/dL   RDW 06.3 01.6 - 01.0 %   Platelets 223.0 150.0 - 400.0 K/uL   Neutrophils Relative % 63.2 43.0 - 77.0 %   Lymphocytes Relative 28.8 12.0 - 46.0 %   Monocytes Relative 6.9 3.0 - 12.0 %   Eosinophils Relative 0.4 0.0 - 5.0 %   Basophils Relative 0.7 0.0 - 3.0 %   Neutro Abs 2.8 1.4 - 7.7 K/uL   Lymphs Abs 1.3 0.7 - 4.0 K/uL   Monocytes Absolute 0.3 0.1 - 1.0 K/uL   Eosinophils Absolute 0.0 0.0 - 0.7 K/uL   Basophils Absolute 0.0 0.0 - 0.1 K/uL  VITAMIN D  25 Hydroxy (Vit-D Deficiency, Fractures)   Collection Time: 06/29/23 11:42 AM  Result Value Ref Range   VITD 29.07 (L) 30.00 - 100.00 ng/mL  Vitamin B12   Collection Time: 06/29/23 11:42 AM  Result Value Ref Range   Vitamin B-12 180 (L) 211 - 911 pg/mL    Assessment and Plan  Acute foot pain, left Assessment & Plan: Acute, focal tenderness to palpation over fifth metatarsal head and base of fifth toe. No palpated or visual foreign body. Possible fracture given stubbed toe.  Will evaluate with x-ray. Patient can use Advil  as needed for pain and inflammation as well as  ice. Limit prolonged weightbearing until x-rays return. We did discuss how x-ray will be limited in localizing foreign body as glass and wood cannot be seen.  Return and ER precautions provided. No current infection seen and signs and symptoms of infection reviewed with patient  Orders: -     DG Foot Complete Left    No follow-ups on file.   Herby Lolling, MD

## 2023-08-29 NOTE — Telephone Encounter (Signed)
 Aware, will watch for correspondence

## 2023-09-21 ENCOUNTER — Ambulatory Visit: Admitting: Family Medicine

## 2023-09-27 ENCOUNTER — Ambulatory Visit: Payer: Self-pay | Admitting: Family Medicine

## 2023-09-27 ENCOUNTER — Encounter: Payer: Self-pay | Admitting: Family Medicine

## 2023-09-27 ENCOUNTER — Ambulatory Visit: Admitting: Family Medicine

## 2023-09-27 ENCOUNTER — Ambulatory Visit (INDEPENDENT_AMBULATORY_CARE_PROVIDER_SITE_OTHER)
Admission: RE | Admit: 2023-09-27 | Discharge: 2023-09-27 | Disposition: A | Discharge status: Home / Self Care | Source: Ambulatory Visit | Attending: Family Medicine | Admitting: Family Medicine

## 2023-09-27 VITALS — BP 116/78 | HR 82 | Temp 98.5°F | Ht 63.0 in | Wt 140.2 lb

## 2023-09-27 DIAGNOSIS — M4802 Spinal stenosis, cervical region: Secondary | ICD-10-CM | POA: Diagnosis not present

## 2023-09-27 DIAGNOSIS — E559 Vitamin D deficiency, unspecified: Secondary | ICD-10-CM

## 2023-09-27 DIAGNOSIS — M542 Cervicalgia: Secondary | ICD-10-CM | POA: Diagnosis not present

## 2023-09-27 DIAGNOSIS — M4310 Spondylolisthesis, site unspecified: Secondary | ICD-10-CM | POA: Diagnosis not present

## 2023-09-27 DIAGNOSIS — R202 Paresthesia of skin: Secondary | ICD-10-CM | POA: Insufficient documentation

## 2023-09-27 DIAGNOSIS — E538 Deficiency of other specified B group vitamins: Secondary | ICD-10-CM

## 2023-09-27 LAB — VITAMIN D 25 HYDROXY (VIT D DEFICIENCY, FRACTURES): VITD: 38.61 ng/mL (ref 30.00–100.00)

## 2023-09-27 LAB — VITAMIN B12: Vitamin B-12: 290 pg/mL (ref 211–911)

## 2023-09-27 MED ORDER — TIZANIDINE HCL 4 MG PO TABS: 4.0000 mg | ORAL_TABLET | Freq: Every evening | ORAL | 0 refills | Status: DC | PRN

## 2023-09-27 NOTE — Assessment & Plan Note (Addendum)
 Pain and stiffness after sleeping Now some paresthesia on left side going down her arm   Reassuring /normal neuro exam and normal grip  Tenderness of left cervical musculature   Will continue heat prn Continue to look for memory foam mattress/pillow that work  Encouraged strongly to change ergonomics at work   Prescription tizanadine for pm use as needed/ caution of sedation  CS film today -some cervical disk dz with spondylosis  Consider PT   Call back and Er precautions noted in detail today

## 2023-09-27 NOTE — Assessment & Plan Note (Signed)
 Level today Takes D some of the time Trying to eat foods with added D and get outside more

## 2023-09-27 NOTE — Progress Notes (Signed)
 Subjective:    Patient ID: Debbie Richardson, female    DOB: 1960-12-13, 63 y.o.   MRN: [id number]  HPI  Wt Readings from Last 3 Encounters:  09/27/23 140 lb 4 oz (63.6 kg)  08/29/23 141 lb (64 kg)  08/02/23 140 lb 6.4 oz (63.7 kg)   24.84 kg/m  Vitals:   09/27/23 0900  BP: 116/78  Pulse: 82  Temp: 98.5 F (36.9 C)  SpO2: 96%   Pt presents for c/o  Tinging in back / left arm and left neck   Slept in a funny position (this has happened before) Woke up with nasty crick in her neck   Now little tingling sensations in left neck and arm  Triggered by sitting (desk/ car)  It helps to move  Still some neck muscle stiffness  Occational tingling goes up to jaw also  Thinks she may have pinched a nerve   No loss of strength  No altered sensation if not tingling   Normal rom of shoulder  Worse to flex neck more than extend  Also rotating left   No cp or shortness of breath  No exertional symptoms   Looking for new mattress/ needs it  Also pillow   No medicines   Used a little heat when neck is stiff    Imaging DG Cervical Spine Complete Result Date: 09/27/2023 CLINICAL DATA:  left sided neck pain with some radicular symptoms EXAM: CERVICAL SPINE - COMPLETE 4+ VIEW COMPARISON:  None Available. FINDINGS: No fracture, dislocation, or prevertebral soft tissue swelling. Mild narrowing of C3-4 and C5-6 interspaces. Minimal anterolisthesis C4-5. IMPRESSION: 1. No acute findings. 2. Mild C3-4 and C5-6 disc space narrowing. Electronically Signed   By: JONETTA Faes M.D.   On: 09/27/2023 10:07   DG Foot Complete Left Result Date: 08/29/2023 CLINICAL DATA:  left pinky toe and 5th metatarsal head pain EXAM: LEFT FOOT - COMPLETE 3+ VIEW COMPARISON:  None Available. FINDINGS: No fracture or dislocation. Normal mineralization and alignment. No radiodense foreign body. Regional soft tissues unremarkable. IMPRESSION: Negative. Electronically Signed   By: JONETTA Faes M.D.   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   Rash 07/05/2023   Vitamin D  deficiency 06/30/2023   B12 deficiency 06/30/2023   Current use of proton pump inhibitor 06/29/2023   Estrogen deficiency 06/29/2023   Family history of osteoporosis 06/29/2023   External hemorrhoid 09/21/2022   IBS (irritable bowel syndrome) 09/21/2022   Genetic testing 05/26/2022   Routine general medical examination at a health care facility 04/25/2022   Orthopnea 12/30/2020   Lipid screening 06/21/2019   History of anaphylaxis 06/21/2019   Screening mammogram, encounter for 01/07/2019   Encounter for follow-up surveillance of ovarian cancer 10/03/2018   Menopause 09/14/2016   Neoplasm, ovary, malignant, unspecified laterality (HCC) 12/09/2015   Status post laparoscopic hysterectomy 10/29/2014   GERD (gastroesophageal reflux disease) 05/01/2007   PSORIASIS NEC 12/07/2006   Past Medical History:  Diagnosis Date   Anxiety    Arthritis    Benign fundic gland polyps of stomach    Dizziness  vertigo   Gallstones    GERD (gastroesophageal reflux disease)    Ovarian cancer (HCC) 2016   Personal history of chemotherapy    Routine general medical examination at a health care facility 04/25/2022   Past Surgical History:  Procedure Laterality Date   CESAREAN SECTION     CHOLECYSTECTOMY     COLONOSCOPY     COLONOSCOPY WITH PROPOFOL  N/A 04/29/2021   Procedure: COLONOSCOPY WITH PROPOFOL ;  Surgeon: Therisa Bi, MD;  Location: [facility] ENDOSCOPY;  Service: Gastroenterology;  Laterality: N/A;   DIAGNOSTIC LAPAROSCOPY     LAPAROSCOPIC HYSTERECTOMY N/A 10/29/2014   Procedure: HYSTERECTOMY TOTAL LAPAROSCOPIC, OMENTUMECTOMY;   Surgeon: Webb Isidor Constable, MD;  Location: [facility] ORS;  Service: Gynecology;  Laterality: N/A;   LAPAROSCOPIC OOPHERECTOMY     OOPHORECTOMY     PORTA CATH REMOVAL N/A 04/26/2018   Procedure: PORTA CATH REMOVAL;  Surgeon: Marea Selinda RAMAN, MD;  Location: [facility] INVASIVE CV LAB;  Service: Cardiovascular;  Laterality: N/A;   UPPER GI ENDOSCOPY     Social History   Tobacco Use   Smoking status: Former    Current packs/day: 0.00    Types: Cigarettes    Quit date: 04/05/1991    Years since quitting: 32.5   Smokeless tobacco: Never  Vaping Use   Vaping status: Never Used  Substance Use Topics   Alcohol use: Yes    Alcohol/week: 0.0 standard drinks of alcohol    Comment: 1 drink of wine daily   Drug use: No   Family History  Problem Relation Age of Onset   Hypertension Mother        60yo    Hyperlipidemia Mother    GER disease Mother    Heart disease Father        34yo   Breast cancer Sister 52   Heart disease Paternal Grandmother    Breast cancer Paternal Grandmother 9       deceased 35   Kidney disease Neg Hx    Liver disease Neg Hx    Colon cancer Neg Hx    Colon polyps Neg Hx    Esophageal cancer Neg Hx    Pancreatic cancer Neg Hx    Allergies  Allergen Reactions   Ciprofloxacin  Anaphylaxis   Paclitaxel Shortness Of Breath and Other (See Comments)    Other reaction(s): Tight chest (finding) Chest and facial flushing Flushing, chest tightness, SOB w/ Taxol on 05/26/14 at [facility]   Flagyl  [Metronidazole ] Other (See Comments)    headache   Nutmeg Oil (Myristica Oil) Other (See Comments)    Tightness in chest and throat and difficulty swallowing and breathing.   Current Outpatient Medications on File Prior to Visit  Medication Sig Dispense Refill   cholecalciferol (VITAMIN D3) 25 MCG (1000 UNIT) tablet Take 2,000 Units by mouth daily.     cyanocobalamin  (VITAMIN B12) 1000 MCG tablet Take 1,000 mcg by mouth daily.     hydrocortisone  (ANUSOL -HC) 2.5 % rectal cream  Apply 1 Application topically 2 (two) times daily. For 1 week 30 g 0   pantoprazole  (PROTONIX ) 40 MG tablet TAKE 1 TABLET BY MOUTH DAILY AS NEEDED 30 tablet 0   No current facility-administered medications on file prior to visit.    Review of Systems  Constitutional:  Negative for activity change, appetite change, fatigue, fever and unexpected weight change.  HENT:  Negative for congestion, ear pain, facial swelling, rhinorrhea, sinus pressure and sore throat.   Eyes:  Negative for pain, redness and visual  disturbance.  Respiratory:  Negative for cough, shortness of breath and wheezing.   Cardiovascular:  Negative for chest pain and palpitations.  Gastrointestinal:  Negative for abdominal pain, blood in stool, constipation and diarrhea.  Endocrine: Negative for polydipsia and polyuria.  Genitourinary:  Negative for dysuria, frequency and urgency.  Musculoskeletal:  Positive for neck pain and neck stiffness. Negative for arthralgias, back pain, gait problem and myalgias.  Skin:  Negative for pallor and rash.  Allergic/Immunologic: Negative for environmental allergies.  Neurological:  Negative for dizziness, tremors, seizures, syncope, facial asymmetry, speech difficulty, weakness, light-headedness, numbness and headaches.       Tingling of left neck/shoulder and arm   Hematological:  Negative for adenopathy. Does not bruise/bleed easily.  Psychiatric/Behavioral:  Negative for decreased concentration and dysphoric mood. The patient is not nervous/anxious.        Objective:   Physical Exam Constitutional:      General: She is not in acute distress.    Appearance: Normal appearance. She is well-developed and normal weight.  HENT:     Head: Normocephalic and atraumatic.   Eyes:     Conjunctiva/sclera: Conjunctivae normal.     Pupils: Pupils are equal, round, and reactive to light.   Neck:     Thyroid: No thyromegaly.     Vascular: No carotid bruit or JVD.     Comments: No spinous  process tenderness   Tender over left cervical musculature No trapezius tenderness  Flex- full with left sided discomfort Ext-full  Some pain with left rotation and tilt   Mild crepitus    Cardiovascular:     Rate and Rhythm: Normal rate and regular rhythm.     Heart sounds: Normal heart sounds.     No gallop.  Pulmonary:     Effort: Pulmonary effort is normal. No respiratory distress.     Breath sounds: Normal breath sounds. No wheezing or rales.  Abdominal:     General: There is no distension or abdominal bruit.     Palpations: Abdomen is soft.   Musculoskeletal:     Cervical back: Normal range of motion and neck supple.     Right lower leg: No edema.     Left lower leg: No edema.     Comments: Normal rom bilateral shoulders  No upper arm tenderness  No elbow tenderness  Lymphadenopathy:     Cervical: No cervical adenopathy.   Skin:    General: Skin is warm and dry.     Coloration: Skin is not pale.     Findings: No rash.   Neurological:     Mental Status: She is alert.     Cranial Nerves: No cranial nerve deficit.     Sensory: No sensory deficit.     Motor: No weakness.     Coordination: Coordination normal.     Deep Tendon Reflexes: Reflexes are normal and symmetric. Reflexes normal.     Comments: Normal strength Strong grip  Psychiatric:        Mood and Affect: Mood normal.           Assessment & Plan:   Problem List Items Addressed This Visit       Other   Vitamin D  deficiency   Level today Takes D some of the time Trying to eat foods with added D and get outside more       Tingling sensation   Left neck/ shoulder/arm  Reassuring exam May be radicular in setting of stiff/sore neck  See a/p for neck pain      Neck pain on left side - Primary   Pain and stiffness after sleeping Now some paresthesia on left side going down her arm   Reassuring /normal neuro exam and normal grip  Tenderness of left cervical musculature   Will  continue heat prn Continue to look for memory foam mattress/pillow that work  Encouraged strongly to Engineer, water at work   Prescription tizanadine for pm use as needed/ caution of sedation  CS film today -some cervical disk dz with spondylosis  Consider PT   Call back and Er precautions noted in detail today        Relevant Orders   DG Cervical Spine Complete (Completed)   B12 deficiency   Taking oral B12 Lab today  On ppi

## 2023-09-27 NOTE — Assessment & Plan Note (Signed)
 Taking oral B12 Lab today  On ppi

## 2023-09-27 NOTE — Patient Instructions (Signed)
 Xray of neck today  We will reach out with results     Keep searching for right mattress and pillow   Change the position of your desk /computer when you can to keep your neck and from being in the same position all day    Try the tizanidine (muscle relaxer) at bedtime   We may consider physical therapy   Labs today for B12 and D

## 2023-09-27 NOTE — Assessment & Plan Note (Signed)
 Left neck/ shoulder/arm  Reassuring exam May be radicular in setting of stiff/sore neck  See a/p for neck pain

## 2023-10-04 ENCOUNTER — Other Ambulatory Visit

## 2023-10-09 ENCOUNTER — Telehealth: Payer: Self-pay | Admitting: Physical Therapy

## 2023-10-09 NOTE — Telephone Encounter (Signed)
 Called pt to inquire about moving up in schedule given increased availability. Pt did not answer but left VM instructing pt to call back if she wanted to move up in schedule.

## 2023-10-11 ENCOUNTER — Ambulatory Visit: Attending: Family Medicine | Admitting: Physical Therapy

## 2023-10-11 DIAGNOSIS — M5412 Radiculopathy, cervical region: Secondary | ICD-10-CM | POA: Diagnosis not present

## 2023-10-11 DIAGNOSIS — M542 Cervicalgia: Secondary | ICD-10-CM | POA: Diagnosis not present

## 2023-10-11 NOTE — Therapy (Signed)
 OUTPATIENT PHYSICAL THERAPY CERVICAL EVALUATION   Patient Name: Debbie Richardson MRN: [id number] DOB:28-Feb-1961, 63 y.o., female Today's Date: 10/11/2023  END OF SESSION:  PT End of Session - 10/11/23 1000     Visit Number 1    Number of Visits 24    Date for PT Re-Evaluation 01/03/24    Authorization Type [id number]    Authorization - Visit Number 1    Authorization - Number of Visits 24    Progress Note Due on Visit 10    PT Start Time 0815    PT Stop Time 0900    PT Time Calculation (min) 45 min    Activity Tolerance Patient tolerated treatment well    Behavior During Therapy WFL for tasks assessed/performed          Past Medical History:  Diagnosis Date   Anxiety    Arthritis    Benign fundic gland polyps of stomach    Dizziness    vertigo   Gallstones    GERD (gastroesophageal reflux disease)    Ovarian cancer (HCC) 2016   Personal history of chemotherapy    Routine general medical examination at a health care facility 04/25/2022   Past Surgical History:  Procedure Laterality Date   CESAREAN SECTION     CHOLECYSTECTOMY     COLONOSCOPY     COLONOSCOPY WITH PROPOFOL  N/A 04/29/2021   Procedure: COLONOSCOPY WITH PROPOFOL ;  Surgeon: Therisa Bi, MD;  Location: [facility] ENDOSCOPY;  Service: Gastroenterology;  Laterality: N/A;   DIAGNOSTIC LAPAROSCOPY     LAPAROSCOPIC HYSTERECTOMY N/A 10/29/2014   Procedure: HYSTERECTOMY TOTAL LAPAROSCOPIC, OMENTUMECTOMY;  Surgeon: Webb Isidor Constable, MD;  Location: [facility] ORS;  Service: Gynecology;  Laterality: N/A;   LAPAROSCOPIC OOPHERECTOMY     OOPHORECTOMY     PORTA CATH REMOVAL N/A 04/26/2018   Procedure: PORTA CATH REMOVAL;  Surgeon: Marea Selinda RAMAN, MD;  Location: [facility] INVASIVE CV LAB;  Service: Cardiovascular;  Laterality: N/A;   UPPER GI ENDOSCOPY     Patient Active Problem List   Diagnosis Date Noted   Neck pain on left side 09/27/2023   Tingling sensation 09/27/2023   Acute foot pain, left 08/29/2023   Lichen simplex  chronicus 08/02/2023   Rash 07/05/2023   Vitamin D  deficiency 06/30/2023   B12 deficiency 06/30/2023   Current use of proton pump inhibitor 06/29/2023   Estrogen deficiency 06/29/2023   Family history of osteoporosis 06/29/2023   External hemorrhoid 09/21/2022   IBS (irritable bowel syndrome) 09/21/2022   Genetic testing 05/26/2022   Routine general medical examination at a health care facility 04/25/2022   Orthopnea 12/30/2020   Lipid screening 06/21/2019   History of anaphylaxis 06/21/2019   Screening mammogram, encounter for 01/07/2019   Encounter for follow-up surveillance of ovarian cancer 10/03/2018   Menopause 09/14/2016   Neoplasm, ovary, malignant, unspecified laterality (HCC) 12/09/2015   Status post laparoscopic hysterectomy 10/29/2014   GERD (gastroesophageal reflux disease) 05/01/2007   PSORIASIS NEC 12/07/2006    PCP: Dr. Laine Balls   REFERRING PROVIDER: Dr. Laine Balls    REFERRING DIAG: M54.2 (ICD-10-CM) - Neck pain on left side  THERAPY DIAG:  Cervicalgia  Radiculopathy, cervical region  Rationale for Evaluation and Treatment: Rehabilitation  ONSET DATE: 07/04/2023    SUBJECTIVE:  SUBJECTIVE STATEMENT: See pertinent history   Hand dominance: Right  PERTINENT HISTORY:  Pt reports worsening left sided neck pain with radiating numbness and tingling down left arm in the past 3 months. The tingling radiates down into left lateral surface of humerus. She works at a desk all day and she describes being hunched over a computer all day.  It also has radiated up into her left anterior upper trap. She has started using a cervical pillow when sleeping, and it has helped to significantly reduce pain and symptoms especially when sleeping.   PAIN:  Are you having pain?  Yes: NPRS scale: 4-5/10, 8-9/10 worst Pain location: Lateral left shoulder.  Pain description: Achy, intermittent numbness and tingling   Aggravating factors: Getting up in the morning or laying  Relieving factors: Cervical pillow and tylenol    PRECAUTIONS: None  RED FLAGS: Cervical red flags: None       WEIGHT BEARING RESTRICTIONS: No  FALLS:  Has patient fallen in last 6 months? No  LIVING ENVIRONMENT: Lives with: Did not ask  Lives in: Did not ask  Stairs: Did not ask   Has following equipment at home: None  OCCUPATION: Desk   PLOF: Independent  PATIENT GOALS: She wants to feel less pain in her left neck and to not have it stop her from sleeping. She wants to also know how to correct posture and tingling.   NEXT MD VISIT: Did not ask    OBJECTIVE:  Note: Objective measures were completed at Evaluation unless otherwise noted.  VITALS: BP 111/73    DIAGNOSTIC FINDINGS:  M54.2 (ICD-10-CM) - Neck pain on left side  Left side neck pain that is non-radiating   CLINICAL DATA:  left sided neck pain with some radicular symptoms   EXAM: CERVICAL SPINE - COMPLETE 4+ VIEW   COMPARISON:  None Available.   FINDINGS: No fracture, dislocation, or prevertebral soft tissue swelling. Mild narrowing of C3-4 and C5-6 interspaces. Minimal anterolisthesis C4-5.   IMPRESSION: 1. No acute findings. 2. Mild C3-4 and C5-6 disc space narrowing.     Electronically Signed   By: JONETTA Faes M.D.   On: 09/27/2023 10:0  PATIENT SURVEYS:  NDI:  NECK DISABILITY INDEX  Date: 10/11/23 Score  Pain intensity 1 = The pain is very mild at the moment  2. Personal care (washing, dressing, etc.) 0 = I can look after myself normally without causing extra pain  3. Lifting 4 =  I can only lift very light weights  4. Reading 1 = I can read as much as I want to with slight pain in my neck  5. Headaches 0 = I have no headaches at all  6. Concentration 0 =  I can concentrate fully when I want to  with no difficulty  7. Work 0 =  I can do as much work as I want to  8. Driving 0 = I can drive my car without any neck pain  9. Sleeping 2 = My sleep is mildly disturbed (1-2 hrs sleepless)  10. Recreation 1 =  I am able to engage in all my recreation activities, with some pain in my neck  Total (9/50) x 100 = 18%    Minimum Detectable Change (90% confidence): 5 points or 10% points  COGNITION: Overall cognitive status: Within functional limits for tasks assessed  SENSATION: WFL, intermittent n/t along C3-C5 dermatomes    POSTURE: rounded shoulders  PALPATION: No areas TTP     CERVICAL ROM:  Active ROM A/PROM (deg) eval  Flexion 45  Extension 45  Right lateral flexion 35  Left lateral flexion 45  Right rotation 60  Left rotation 58   (Blank rows = not tested)  UPPER EXTREMITY ROM:  Active ROM Right eval Left eval  Shoulder flexion    Shoulder extension    Shoulder abduction    Shoulder adduction    Shoulder extension    Shoulder internal rotation    Shoulder external rotation    Elbow flexion    Elbow extension    Wrist flexion    Wrist extension    Wrist ulnar deviation    Wrist radial deviation    Wrist pronation    Wrist supination     (Blank rows = not tested)  Combined ER  Occiput  Occiput  Combined IR    PSIS      PSIS   UPPER EXTREMITY MMT:  MMT Right eval Left eval  Shoulder flexion 4+ 4+  Shoulder extension 4+ 4+  Shoulder abduction 4+ 4+  Shoulder adduction    Shoulder extension    Shoulder internal rotation 4 4  Shoulder external rotation 4 4  Middle trapezius 4 4  Lower trapezius 4- 4-  Elbow flexion 4 4  Elbow extension 4 4  Wrist flexion 4 4  Wrist extension 4 4  Wrist ulnar deviation    Wrist radial deviation    Wrist pronation    Wrist supination    Grip strength Good  Good    (Blank rows = not tested)  CERVICAL SPECIAL TESTS:  Cranial cervical flexion test: NT, Upper limb tension test (ULTT): Positive, and  Spurling's test: Negative    TREATMENT DATE: 10/11/23: JERRE  Chin tucks in sitting 1 x 10  -Pt reports increased left sided numbness and tingling  Chin tucks in supine 1 x 10  -Pt reports no symptoms    Upper Trap Stretch on left upper trap 2 x 60 sec                                                                                                         -min VC to sequence exercise    PATIENT EDUCATION:  Education details: Form and technique for correct performance of exercise and explanation of about cervical radiculopathy  Person educated: Patient Education method: Programmer, multimedia, Facilities manager, Verbal cues, and Handouts Education comprehension: verbalized understanding, returned demonstration, and verbal cues required  HOME EXERCISE PROGRAM: Access Code: [email or web address] URL: [email or web address] Date: 10/11/2023 Prepared by: Toribio Servant  Exercises - Seated Cervical Sidebending Stretch  - 1 x daily - 7 x weekly - 2-3 reps - 60 sec  hold - Supine Cervical Retraction with Towel  - 1 x daily - 7 x weekly - 2 sets - 15 reps - 2 sec  hold  ASSESSMENT:  CLINICAL IMPRESSION: Patient is a 63 y.o. white female who was seen today for physical therapy evaluation and treatment for left sided neck pain. She has signs and symptoms of left sided cervical radiculopathy with positive left upper limb tension  test and numbness and tingling that radiates down left lateral shoulder. Her deficits include decreased periscapular strength, cervical ROM, increased left side neck pain with all rotator cuff and shoulder pathology ruled out. She will benefit from skilled PT to address these aforementioned deficits to return to sleeping without excessive pain and discomfort and to maintain health benefits of sleeping.   OBJECTIVE IMPAIRMENTS: decreased ROM, decreased strength, hypomobility, impaired sensation, impaired UE functional use, postural dysfunction, and pain.   ACTIVITY LIMITATIONS:  carrying, lifting, sleeping, bed mobility, and reach over head  PARTICIPATION LIMITATIONS: occupation  PERSONAL FACTORS: Age, Fitness, and 1 comorbidity: h/o cancer are also affecting patient's functional outcome.   REHAB POTENTIAL: Good  CLINICAL DECISION MAKING: Stable/uncomplicated  EVALUATION COMPLEXITY: Low   GOALS: Goals reviewed with patient? No  SHORT TERM GOALS: Target date: 10/25/2023  Patient will demonstrate undestanding of home exercise plan by performing exercises correctly with evidence of good carry over with min to no verbal or tactile cues .   Baseline: NT  Goal status: INITIAL  2.  Patient will demonstrate understanding of correct ergonomic step for home workstation to prevent abnormal postures and buildup of muscle imbalances.  Baseline: NT  Goal status: INITIAL   LONG TERM GOALS: Target date: 01/03/2024  Patient will reduce neck disability index by >=5 pts or 10% as evidence of improved neck function that is not due to statistical error or chance. Baseline: 9/50 (18%)  Goal status: INITIAL  2.  Patient will improve right side bend AROM to be within normal limits as evidence of improved muscular tension and cervical function. Baseline: Cervical SB R/L 35/45   Goal status: INITIAL  3.  Patient will improve periscapular strength by 1/3 grade MMT (ie 4- to 4) for improved cervical stability and posture to decrease incidence of left sided neck pain and radicular symptoms. Baseline:  Goal status: INITIAL     PLAN:  PT FREQUENCY: 1-2x/week  PT DURATION: 12 weeks  PLANNED INTERVENTIONS: 97164- PT Re-evaluation, 97750- Physical Performance Testing, 97110-Therapeutic exercises, 97530- Therapeutic activity, [id number]- Neuromuscular re-education, 97535- Self Care, [id number]- Manual therapy, [phone]- Aquatic Therapy, [id number]- Electrical stimulation (unattended), [phone]- Electrical stimulation (manual), [id number]- Traction (mechanical), 20560 (1-2 muscles), 20561 (3+ muscles)-  Dry Needling, Patient/Family education, Joint mobilization, Joint manipulation, Spinal manipulation, Spinal mobilization, Vestibular training, Cryotherapy, and Moist heat  PLAN FOR NEXT SESSION: UBE for warm up. BPPV testing, Activity modification with work place setup, upper extremity nerve glides. D2 Shoulder Flexion and Extension.   Toribio Servant PT, DPT  [facility] [address], [place], [address] Phone: [phone]   Fax:  [phone]

## 2023-10-17 ENCOUNTER — Ambulatory Visit: Admitting: Physical Therapy

## 2023-10-17 DIAGNOSIS — M542 Cervicalgia: Secondary | ICD-10-CM

## 2023-10-17 DIAGNOSIS — M5412 Radiculopathy, cervical region: Secondary | ICD-10-CM

## 2023-10-17 NOTE — Therapy (Signed)
 OUTPATIENT PHYSICAL THERAPY CERVICAL TREATMENT    Patient Name: Debbie Richardson MRN: [id number] DOB:1960/10/02, 63 y.o., female Today's Date: 10/17/2023  END OF SESSION:  PT End of Session - 10/17/23 1653     Visit Number 2    Number of Visits 24    Date for PT Re-Evaluation 01/03/24    Authorization Type [id number]    Authorization - Visit Number 2    Authorization - Number of Visits 24    Progress Note Due on Visit 10    PT Start Time 1650    PT Stop Time 1730    PT Time Calculation (min) 40 min    Activity Tolerance Patient tolerated treatment well    Behavior During Therapy WFL for tasks assessed/performed           Past Medical History:  Diagnosis Date   Anxiety    Arthritis    Benign fundic gland polyps of stomach    Dizziness    vertigo   Gallstones    GERD (gastroesophageal reflux disease)    Ovarian cancer (HCC) 2016   Personal history of chemotherapy    Routine general medical examination at a health care facility 04/25/2022   Past Surgical History:  Procedure Laterality Date   CESAREAN SECTION     CHOLECYSTECTOMY     COLONOSCOPY     COLONOSCOPY WITH PROPOFOL  N/A 04/29/2021   Procedure: COLONOSCOPY WITH PROPOFOL ;  Surgeon: Therisa Bi, MD;  Location: [facility] ENDOSCOPY;  Service: Gastroenterology;  Laterality: N/A;   DIAGNOSTIC LAPAROSCOPY     LAPAROSCOPIC HYSTERECTOMY N/A 10/29/2014   Procedure: HYSTERECTOMY TOTAL LAPAROSCOPIC, OMENTUMECTOMY;  Surgeon: Webb Isidor Constable, MD;  Location: [facility] ORS;  Service: Gynecology;  Laterality: N/A;   LAPAROSCOPIC OOPHERECTOMY     OOPHORECTOMY     PORTA CATH REMOVAL N/A 04/26/2018   Procedure: PORTA CATH REMOVAL;  Surgeon: Marea Selinda RAMAN, MD;  Location: [facility] INVASIVE CV LAB;  Service: Cardiovascular;  Laterality: N/A;   UPPER GI ENDOSCOPY     Patient Active Problem List   Diagnosis Date Noted   Neck pain on left side 09/27/2023   Tingling sensation 09/27/2023   Acute foot pain, left 08/29/2023   Lichen simplex  chronicus 08/02/2023   Rash 07/05/2023   Vitamin D  deficiency 06/30/2023   B12 deficiency 06/30/2023   Current use of proton pump inhibitor 06/29/2023   Estrogen deficiency 06/29/2023   Family history of osteoporosis 06/29/2023   External hemorrhoid 09/21/2022   IBS (irritable bowel syndrome) 09/21/2022   Genetic testing 05/26/2022   Routine general medical examination at a health care facility 04/25/2022   Orthopnea 12/30/2020   Lipid screening 06/21/2019   History of anaphylaxis 06/21/2019   Screening mammogram, encounter for 01/07/2019   Encounter for follow-up surveillance of ovarian cancer 10/03/2018   Menopause 09/14/2016   Neoplasm, ovary, malignant, unspecified laterality (HCC) 12/09/2015   Status post laparoscopic hysterectomy 10/29/2014   GERD (gastroesophageal reflux disease) 05/01/2007   PSORIASIS NEC 12/07/2006    PCP: Dr. Laine Balls   REFERRING PROVIDER: Dr. Laine Balls    REFERRING DIAG: M54.2 (ICD-10-CM) - Neck pain on left side  THERAPY DIAG:  No diagnosis found.  Rationale for Evaluation and Treatment: Rehabilitation  ONSET DATE: 07/04/2023    SUBJECTIVE:  SUBJECTIVE STATEMENT: Pt states that she continues to experience numbness and tingling and pain down left shoulder intermittently. She is still not sure what exactly sets it off, but she does notice more when watching tv later and sitting at her desk while doing office work. She also notices it when laying on her right side she tends to notice it more. She is also experience more tension across shoulders and neck.  Hand dominance: Right  PERTINENT HISTORY:  Pt reports worsening left sided neck pain with radiating numbness and tingling down left arm in the past 3 months. The tingling radiates down into left  lateral surface of humerus. She works at a desk all day and she describes being hunched over a computer all day.  It also has radiated up into her left anterior upper trap. She has started using a cervical pillow when sleeping, and it has helped to significantly reduce pain and symptoms especially when sleeping.   PAIN:  Are you having pain? No, put she feels more tingling and numbness in her left arm.  Pain location: Lateral left shoulder.  Pain description: Achy, intermittent numbness and tingling   Aggravating factors: Getting up in the morning or laying  Relieving factors: Cervical pillow and tylenol    PRECAUTIONS: None  RED FLAGS: Cervical red flags: None       WEIGHT BEARING RESTRICTIONS: No  FALLS:  Has patient fallen in last 6 months? No  LIVING ENVIRONMENT: Lives with: Did not ask  Lives in: Did not ask  Stairs: Did not ask   Has following equipment at home: None  OCCUPATION: Desk   PLOF: Independent  PATIENT GOALS: She wants to feel less pain in her left neck and to not have it stop her from sleeping. She wants to also know how to correct posture and tingling.   NEXT MD VISIT: Did not ask    OBJECTIVE:  Note: Objective measures were completed at Evaluation unless otherwise noted.  VITALS: BP 111/73    DIAGNOSTIC FINDINGS:  M54.2 (ICD-10-CM) - Neck pain on left side  Left side neck pain that is non-radiating   CLINICAL DATA:  left sided neck pain with some radicular symptoms   EXAM: CERVICAL SPINE - COMPLETE 4+ VIEW   COMPARISON:  None Available.   FINDINGS: No fracture, dislocation, or prevertebral soft tissue swelling. Mild narrowing of C3-4 and C5-6 interspaces. Minimal anterolisthesis C4-5.   IMPRESSION: 1. No acute findings. 2. Mild C3-4 and C5-6 disc space narrowing.     Electronically Signed   By: JONETTA Faes M.D.   On: 09/27/2023 10:0  PATIENT SURVEYS:  NDI:  NECK DISABILITY INDEX  Date: 10/11/23 Score  Pain intensity 1 = The pain is  very mild at the moment  2. Personal care (washing, dressing, etc.) 0 = I can look after myself normally without causing extra pain  3. Lifting 4 =  I can only lift very light weights  4. Reading 1 = I can read as much as I want to with slight pain in my neck  5. Headaches 0 = I have no headaches at all  6. Concentration 0 =  I can concentrate fully when I want to with no difficulty  7. Work 0 =  I can do as much work as I want to  8. Driving 0 = I can drive my car without any neck pain  9. Sleeping 2 = My sleep is mildly disturbed (1-2 hrs sleepless)  10. Recreation 1 =  I am able to engage in all my recreation activities, with some pain in my neck  Total (9/50) x 100 = 18%    Minimum Detectable Change (90% confidence): 5 points or 10% points  COGNITION: Overall cognitive status: Within functional limits for tasks assessed  SENSATION: WFL, intermittent n/t along C3-C5 dermatomes    POSTURE: rounded shoulders  PALPATION: No areas TTP     CERVICAL ROM:   Active ROM A/PROM (deg) eval  Flexion 45  Extension 45  Right lateral flexion 35  Left lateral flexion 45  Right rotation 60  Left rotation 58   (Blank rows = not tested)  UPPER EXTREMITY ROM:  Active ROM Right eval Left eval  Shoulder flexion    Shoulder extension    Shoulder abduction    Shoulder adduction    Shoulder extension    Shoulder internal rotation    Shoulder external rotation    Elbow flexion    Elbow extension    Wrist flexion    Wrist extension    Wrist ulnar deviation    Wrist radial deviation    Wrist pronation    Wrist supination     (Blank rows = not tested)  Combined ER  Occiput  Occiput  Combined IR    PSIS      PSIS   UPPER EXTREMITY MMT:  MMT Right eval Left eval  Shoulder flexion 4+ 4+  Shoulder extension 4+ 4+  Shoulder abduction 4+ 4+  Shoulder adduction    Shoulder extension    Shoulder internal rotation 4 4  Shoulder external rotation 4 4  Middle trapezius 4 4   Lower trapezius 4- 4-  Elbow flexion 4 4  Elbow extension 4 4  Wrist flexion 4 4  Wrist extension 4 4  Wrist ulnar deviation    Wrist radial deviation    Wrist pronation    Wrist supination    Grip strength Good  Good    (Blank rows = not tested)  CERVICAL SPECIAL TESTS:  Cranial cervical flexion test: NT, Upper limb tension test (ULTT): Positive, and Spurling's test: Negative    TREATMENT DATE:   10/17/23:  MANUAL   Upper Trap trigger point release    Suboccipital release   -Fingers  -with   Theracane for suboccipital pressure    THEREX  UBE 2.5 forward and 2.5 backward    Levator Scap Stretch 2 x 30 sec   D2 PNF flexion and extension with yellow band 2 x 10  D2 PNF flexion and extension with red band 2 x 10      PATIENT EDUCATION:  Education details: Form and technique for correct performance of exercise and explanation of about cervical radiculopathy  Person educated: Patient Education method: Programmer, multimedia, Demonstration, Verbal cues, and Handouts Education comprehension: verbalized understanding, returned demonstration, and verbal cues required  HOME EXERCISE PROGRAM: Access Code: [email or web address] URL: [email or web address] Date: 10/17/2023 Prepared by: Toribio Servant  Exercises - Supine Suboccipital Release with Tennis Balls  - 1 x daily - 7 x weekly - 45-60 sec hold - Seated Cervical Sidebending Stretch  - 1 x daily - 7 x weekly - 2-3 reps - 60 sec  hold - Supine Cervical Retraction with Towel  - 1 x daily - 7 x weekly - 2 sets - 15 reps - 2 sec  hold - Seated Levator Scapulae Stretch  - 1 x daily - 7 x weekly - 2-3 reps - 60 sec  hold - Standing Shoulder Single Arm PNF  D2 Flexion with Resistance  - 3-4 x weekly - 3 sets - 10 reps  ASSESSMENT:  CLINICAL IMPRESSION: Pt presents for initial follow up follow eval for left sided radiculopathy. Pt shows increased tension throughout cervical musculature especially in suboccipitals and upper trap. She  was able to experience relief in tension with suboccipital relief. She was able to perform all exercises without an increase in her symptoms with exception of d2 pnf shoulder flexion and extension, but this did not limit her from performing exercise. She will benefit from skilled PT to address these aforementioned deficits to return to sleeping without excessive pain and discomfort and to maintain health benefits of sleeping.   OBJECTIVE IMPAIRMENTS: decreased ROM, decreased strength, hypomobility, impaired sensation, impaired UE functional use, postural dysfunction, and pain.   ACTIVITY LIMITATIONS: carrying, lifting, sleeping, bed mobility, and reach over head  PARTICIPATION LIMITATIONS: occupation  PERSONAL FACTORS: Age, Fitness, and 1 comorbidity: h/o cancer are also affecting patient's functional outcome.   REHAB POTENTIAL: Good  CLINICAL DECISION MAKING: Stable/uncomplicated  EVALUATION COMPLEXITY: Low   GOALS: Goals reviewed with patient? No  SHORT TERM GOALS: Target date: 10/25/2023  Patient will demonstrate undestanding of home exercise plan by performing exercises correctly with evidence of good carry over with min to no verbal or tactile cues .   Baseline: NT 10/17/23: Performing independently   Goal status: ACHIEVED    2.  Patient will demonstrate understanding of correct ergonomic step for home workstation to prevent abnormal postures and buildup of muscle imbalances.  Baseline: NT  Goal status: ONGOING     LONG TERM GOALS: Target date: 01/03/2024  Patient will reduce neck disability index by >=5 pts or 10% as evidence of improved neck function that is not due to statistical error or chance. Baseline: 9/50 (18%)  Goal status: ONGOING    2.  Patient will improve right side bend AROM to be within normal limits as evidence of improved muscular tension and cervical function. Baseline: Cervical SB R/L 35/45   Goal status: ONGOING    3.  Patient will improve periscapular  strength by 1/3 grade MMT (ie 4- to 4) for improved cervical stability and posture to decrease incidence of left sided neck pain and radicular symptoms. Baseline: Lower Trap  R/L 4-/4-, Mid Trap 4/4   Goal status: ONGOING       PLAN:  PT FREQUENCY: 1-2x/week  PT DURATION: 12 weeks  PLANNED INTERVENTIONS: 97164- PT Re-evaluation, 97750- Physical Performance Testing, 97110-Therapeutic exercises, 97530- Therapeutic activity, 97112- Neuromuscular re-education, 97535- Self Care, 02859- Manual therapy, [phone]- Aquatic Therapy, [id number]- Electrical stimulation (unattended), [phone]- Electrical stimulation (manual), [id number]- Traction (mechanical), 20560 (1-2 muscles), 20561 (3+ muscles)- Dry Needling, Patient/Family education, Joint mobilization, Joint manipulation, Spinal manipulation, Spinal mobilization, Vestibular training, Cryotherapy, and Moist heat  PLAN FOR NEXT SESSION:  Activity modification with work place setup, upper extremity nerve glides. Seated rows and lat pull down and horizontal abduction at 90 deg.   Toribio Servant PT, DPT  [facility] [address] [place], [address] Phone: [phone]   Fax:  [phone]

## 2023-10-19 ENCOUNTER — Ambulatory Visit: Admitting: Physical Therapy

## 2023-10-19 DIAGNOSIS — M5412 Radiculopathy, cervical region: Secondary | ICD-10-CM

## 2023-10-19 DIAGNOSIS — M542 Cervicalgia: Secondary | ICD-10-CM | POA: Diagnosis not present

## 2023-10-19 NOTE — Therapy (Signed)
 OUTPATIENT PHYSICAL THERAPY CERVICAL TREATMENT    Patient Name: Debbie Richardson MRN: [id number] DOB:23-Jan-1961, 63 y.o., female Today's Date: 10/19/2023  END OF SESSION:  PT End of Session - 10/19/23 1122     Visit Number 3    Number of Visits 24    Date for PT Re-Evaluation 01/03/24    Authorization Type [id number]    Authorization - Visit Number 3    Authorization - Number of Visits 24    Progress Note Due on Visit 10    PT Start Time 1117    PT Stop Time 1200    PT Time Calculation (min) 43 min    Activity Tolerance Patient tolerated treatment well    Behavior During Therapy WFL for tasks assessed/performed           Past Medical History:  Diagnosis Date   Anxiety    Arthritis    Benign fundic gland polyps of stomach    Dizziness    vertigo   Gallstones    GERD (gastroesophageal reflux disease)    Ovarian cancer (HCC) 2016   Personal history of chemotherapy    Routine general medical examination at a health care facility 04/25/2022   Past Surgical History:  Procedure Laterality Date   CESAREAN SECTION     CHOLECYSTECTOMY     COLONOSCOPY     COLONOSCOPY WITH PROPOFOL  N/A 04/29/2021   Procedure: COLONOSCOPY WITH PROPOFOL ;  Surgeon: Therisa Bi, MD;  Location: [facility] ENDOSCOPY;  Service: Gastroenterology;  Laterality: N/A;   DIAGNOSTIC LAPAROSCOPY     LAPAROSCOPIC HYSTERECTOMY N/A 10/29/2014   Procedure: HYSTERECTOMY TOTAL LAPAROSCOPIC, OMENTUMECTOMY;  Surgeon: Webb Isidor Constable, MD;  Location: [facility] ORS;  Service: Gynecology;  Laterality: N/A;   LAPAROSCOPIC OOPHERECTOMY     OOPHORECTOMY     PORTA CATH REMOVAL N/A 04/26/2018   Procedure: PORTA CATH REMOVAL;  Surgeon: Marea Selinda RAMAN, MD;  Location: [facility] INVASIVE CV LAB;  Service: Cardiovascular;  Laterality: N/A;   UPPER GI ENDOSCOPY     Patient Active Problem List   Diagnosis Date Noted   Neck pain on left side 09/27/2023   Tingling sensation 09/27/2023   Acute foot pain, left 08/29/2023   Lichen simplex  chronicus 08/02/2023   Rash 07/05/2023   Vitamin D  deficiency 06/30/2023   B12 deficiency 06/30/2023   Current use of proton pump inhibitor 06/29/2023   Estrogen deficiency 06/29/2023   Family history of osteoporosis 06/29/2023   External hemorrhoid 09/21/2022   IBS (irritable bowel syndrome) 09/21/2022   Genetic testing 05/26/2022   Routine general medical examination at a health care facility 04/25/2022   Orthopnea 12/30/2020   Lipid screening 06/21/2019   History of anaphylaxis 06/21/2019   Screening mammogram, encounter for 01/07/2019   Encounter for follow-up surveillance of ovarian cancer 10/03/2018   Menopause 09/14/2016   Neoplasm, ovary, malignant, unspecified laterality (HCC) 12/09/2015   Status post laparoscopic hysterectomy 10/29/2014   GERD (gastroesophageal reflux disease) 05/01/2007   PSORIASIS NEC 12/07/2006    PCP: Dr. Laine Balls   REFERRING PROVIDER: Dr. Laine Balls    REFERRING DIAG: M54.2 (ICD-10-CM) - Neck pain on left side  THERAPY DIAG:  Cervicalgia  Radiculopathy, cervical region  Rationale for Evaluation and Treatment: Rehabilitation  ONSET DATE: 07/04/2023    SUBJECTIVE:  SUBJECTIVE STATEMENT: Pt reports improvement in left cervical radiculopathy with decrease incidence in numbness and tingling radiating down right shoulder. She is not sure what to attribute this.   Hand dominance: Right  PERTINENT HISTORY:  Pt reports worsening left sided neck pain with radiating numbness and tingling down left arm in the past 3 months. The tingling radiates down into left lateral surface of humerus. She works at a desk all day and she describes being hunched over a computer all day.  It also has radiated up into her left anterior upper trap. She has started using a  cervical pillow when sleeping, and it has helped to significantly reduce pain and symptoms especially when sleeping.   PAIN:  Are you having pain? No, put she feels more tingling and numbness in her left arm.  Pain location: Lateral left shoulder.  Pain description: Achy, intermittent numbness and tingling   Aggravating factors: Getting up in the morning or laying  Relieving factors: Cervical pillow and tylenol    PRECAUTIONS: None  RED FLAGS: Cervical red flags: None       WEIGHT BEARING RESTRICTIONS: No  FALLS:  Has patient fallen in last 6 months? No  LIVING ENVIRONMENT: Lives with: Did not ask  Lives in: Did not ask  Stairs: Did not ask   Has following equipment at home: None  OCCUPATION: Desk   PLOF: Independent  PATIENT GOALS: She wants to feel less pain in her left neck and to not have it stop her from sleeping. She wants to also know how to correct posture and tingling.   NEXT MD VISIT: Did not ask    OBJECTIVE:  Note: Objective measures were completed at Evaluation unless otherwise noted.  VITALS: BP 111/73    DIAGNOSTIC FINDINGS:  M54.2 (ICD-10-CM) - Neck pain on left side  Left side neck pain that is non-radiating   CLINICAL DATA:  left sided neck pain with some radicular symptoms   EXAM: CERVICAL SPINE - COMPLETE 4+ VIEW   COMPARISON:  None Available.   FINDINGS: No fracture, dislocation, or prevertebral soft tissue swelling. Mild narrowing of C3-4 and C5-6 interspaces. Minimal anterolisthesis C4-5.   IMPRESSION: 1. No acute findings. 2. Mild C3-4 and C5-6 disc space narrowing.     Electronically Signed   By: JONETTA Faes M.D.   On: 09/27/2023 10:0  PATIENT SURVEYS:  NDI:  NECK DISABILITY INDEX  Date: 10/11/23 Score  Pain intensity 1 = The pain is very mild at the moment  2. Personal care (washing, dressing, etc.) 0 = I can look after myself normally without causing extra pain  3. Lifting 4 =  I can only lift very light weights  4.  Reading 1 = I can read as much as I want to with slight pain in my neck  5. Headaches 0 = I have no headaches at all  6. Concentration 0 =  I can concentrate fully when I want to with no difficulty  7. Work 0 =  I can do as much work as I want to  8. Driving 0 = I can drive my car without any neck pain  9. Sleeping 2 = My sleep is mildly disturbed (1-2 hrs sleepless)  10. Recreation 1 =  I am able to engage in all my recreation activities, with some pain in my neck  Total (9/50) x 100 = 18%    Minimum Detectable Change (90% confidence): 5 points or 10% points  COGNITION: Overall cognitive status: Within functional  limits for tasks assessed  SENSATION: WFL, intermittent n/t along C3-C5 dermatomes    POSTURE: rounded shoulders  PALPATION: No areas TTP     CERVICAL ROM:   Active ROM A/PROM (deg) eval  Flexion 45  Extension 45  Right lateral flexion 35  Left lateral flexion 45  Right rotation 60  Left rotation 58   (Blank rows = not tested)  UPPER EXTREMITY ROM:  Active ROM Right eval Left eval  Shoulder flexion    Shoulder extension    Shoulder abduction    Shoulder adduction    Shoulder extension    Shoulder internal rotation    Shoulder external rotation    Elbow flexion    Elbow extension    Wrist flexion    Wrist extension    Wrist ulnar deviation    Wrist radial deviation    Wrist pronation    Wrist supination     (Blank rows = not tested)  Combined ER  Occiput  Occiput  Combined IR    PSIS      PSIS   UPPER EXTREMITY MMT:  MMT Right eval Left eval  Shoulder flexion 4+ 4+  Shoulder extension 4+ 4+  Shoulder abduction 4+ 4+  Shoulder adduction    Shoulder extension    Shoulder internal rotation 4 4  Shoulder external rotation 4 4  Middle trapezius 4 4  Lower trapezius 4- 4-  Elbow flexion 4 4  Elbow extension 4 4  Wrist flexion 4 4  Wrist extension 4 4  Wrist ulnar deviation    Wrist radial deviation    Wrist pronation    Wrist  supination    Grip strength Good  Good    (Blank rows = not tested)  CERVICAL SPECIAL TESTS:  Cranial cervical flexion test: NT, Upper limb tension test (ULTT): Positive, and Spurling's test: Negative    TREATMENT DATE:   10/19/23:  THEREX   UBE seat at 8 - 2.5 min forward and 2.5 min backward  OMEGA Seated Rows #25 2 x 10   Standing Rows Black band 1 x 10   Shoulder Horizontal Abduction AROM 1 x 10  Shoulder Horizontal Abduction with yellow band 2 x 10    MANUAL  Left upper trap massage and trigger point   [facility] HOME MANAGEMENT  Education about cervical dermatomes and how her n/t in neck is connected to likely nerve impingement  [email or web address]    PATIENT EDUCATION:  Education details: Form and technique for correct performance of exercise and explanation of about cervical radiculopathy  Person educated: Patient Education method: Programmer, multimedia, Demonstration, Verbal cues, and Handouts Education comprehension: verbalized understanding, returned demonstration, and verbal cues required  HOME EXERCISE PROGRAM: Access Code: [email or web address] URL: [email or web address] Date: 10/19/2023 Prepared by: Toribio Servant  Exercises - Supine Suboccipital Release with Tennis Balls  - 1 x daily - 7 x weekly - 45-60 sec hold - Seated Cervical Sidebending Stretch  - 1 x daily - 7 x weekly - 2-3 reps - 60 sec  hold - Supine Cervical Retraction with Towel  - 1 x daily - 7 x weekly - 2 sets - 15 reps - 2 sec  hold - Seated Levator Scapulae Stretch  - 1 x daily - 7 x weekly - 2-3 reps - 60 sec  hold - Standing Shoulder Single Arm PNF D2 Flexion with Resistance  - 3-4 x weekly - 3 sets - 10 reps - Standing Bilateral Low Shoulder Row with Anchored Resistance  - 3-4  x weekly - 3 sets - 10 reps - Seated Shoulder Horizontal Abduction with Resistance  - 3-4 x weekly - 3 sets - 10 reps  ASSESSMENT:  CLINICAL IMPRESSION:  Pt progressing towards  goals with ability to perform all strengthening exercises without provocation of left sided n/t. Her n/t indicated C3/C4 nerve impingement especially with ipsilateral side flexion. She exhibits ongoing increased tension in left upper tension. She also continues to exhibit shoulder shrug for increased compensation for overhead activity. She will benefit from skilled PT to address these aforementioned deficits to return to sleeping without excessive pain and discomfort and to maintain health benefits of sleeping.   OBJECTIVE IMPAIRMENTS: decreased ROM, decreased strength, hypomobility, impaired sensation, impaired UE functional use, postural dysfunction, and pain.   ACTIVITY LIMITATIONS: carrying, lifting, sleeping, bed mobility, and reach over head  PARTICIPATION LIMITATIONS: occupation  PERSONAL FACTORS: Age, Fitness, and 1 comorbidity: h/o cancer are also affecting patient's functional outcome.   REHAB POTENTIAL: Good  CLINICAL DECISION MAKING: Stable/uncomplicated  EVALUATION COMPLEXITY: Low   GOALS: Goals reviewed with patient? No  SHORT TERM GOALS: Target date: 10/25/2023  Patient will demonstrate undestanding of home exercise plan by performing exercises correctly with evidence of good carry over with min to no verbal or tactile cues .   Baseline: NT 10/17/23: Performing independently   Goal status: ACHIEVED    2.  Patient will demonstrate understanding of correct ergonomic step for home workstation to prevent abnormal postures and buildup of muscle imbalances.  Baseline: NT  Goal status: ONGOING     LONG TERM GOALS: Target date: 01/03/2024  Patient will reduce neck disability index by >=5 pts or 10% as evidence of improved neck function that is not due to statistical error or chance. Baseline: 9/50 (18%)  Goal status: ONGOING    2.  Patient will improve right side bend AROM to be within normal limits as evidence of improved muscular tension and cervical function. Baseline:  Cervical SB R/L 35/45   Goal status: ONGOING    3.  Patient will improve periscapular strength by 1/3 grade MMT (ie 4- to 4) for improved cervical stability and posture to decrease incidence of left sided neck pain and radicular symptoms. Baseline: Lower Trap  R/L 4-/4-, Mid Trap 4/4   Goal status: ONGOING       PLAN:  PT FREQUENCY: 1-2x/week  PT DURATION: 12 weeks  PLANNED INTERVENTIONS: 97164- PT Re-evaluation, 97750- Physical Performance Testing, 97110-Therapeutic exercises, 97530- Therapeutic activity, [id number]- Neuromuscular re-education, 97535- Self Care, [id number]- Manual therapy, [phone]- Aquatic Therapy, [id number]- Electrical stimulation (unattended), [phone]- Electrical stimulation (manual), [id number]- Traction (mechanical), 20560 (1-2 muscles), 20561 (3+ muscles)- Dry Needling, Patient/Family education, Joint mobilization, Joint manipulation, Spinal manipulation, Spinal mobilization, Vestibular training, Cryotherapy, and Moist heat  PLAN FOR NEXT SESSION:  Nerve glides. Face pulls and lat pull down.    Toribio Servant PT, DPT  [facility] [address], [place], [address] Phone: [phone]   Fax:  [phone]

## 2023-10-23 ENCOUNTER — Ambulatory Visit: Admitting: Physical Therapy

## 2023-10-23 DIAGNOSIS — M542 Cervicalgia: Secondary | ICD-10-CM | POA: Diagnosis not present

## 2023-10-23 DIAGNOSIS — M5412 Radiculopathy, cervical region: Secondary | ICD-10-CM | POA: Diagnosis not present

## 2023-10-23 NOTE — Therapy (Signed)
 OUTPATIENT PHYSICAL THERAPY CERVICAL TREATMENT    Patient Name: Debbie Richardson MRN: [id number] DOB:1960-11-08, 63 y.o., female Today's Date: 10/23/2023  END OF SESSION:  PT End of Session - 10/23/23 0956     Visit Number 4    Number of Visits 24    Date for PT Re-Evaluation 01/03/24    Authorization Type [id number]    Authorization - Visit Number 4    Authorization - Number of Visits 24    Progress Note Due on Visit 10    PT Start Time 0955    PT Stop Time 1030    PT Time Calculation (min) 35 min    Activity Tolerance Patient tolerated treatment well    Behavior During Therapy [facility] for tasks assessed/performed           Past Medical History:  Diagnosis Date   Anxiety    Arthritis    Benign fundic gland polyps of stomach    Dizziness    vertigo   Gallstones    GERD (gastroesophageal reflux disease)    Ovarian cancer (HCC) 2016   Personal history of chemotherapy    Routine general medical examination at a health care facility 04/25/2022   Past Surgical History:  Procedure Laterality Date   CESAREAN SECTION     CHOLECYSTECTOMY     COLONOSCOPY     COLONOSCOPY WITH PROPOFOL  N/A 04/29/2021   Procedure: COLONOSCOPY WITH PROPOFOL ;  Surgeon: Therisa Bi, MD;  Location: [facility] ENDOSCOPY;  Service: Gastroenterology;  Laterality: N/A;   DIAGNOSTIC LAPAROSCOPY     LAPAROSCOPIC HYSTERECTOMY N/A 10/29/2014   Procedure: HYSTERECTOMY TOTAL LAPAROSCOPIC, OMENTUMECTOMY;  Surgeon: Webb Isidor Constable, MD;  Location: [facility] ORS;  Service: Gynecology;  Laterality: N/A;   LAPAROSCOPIC OOPHERECTOMY     OOPHORECTOMY     PORTA CATH REMOVAL N/A 04/26/2018   Procedure: PORTA CATH REMOVAL;  Surgeon: Marea Selinda RAMAN, MD;  Location: [facility] INVASIVE CV LAB;  Service: Cardiovascular;  Laterality: N/A;   UPPER GI ENDOSCOPY     Patient Active Problem List   Diagnosis Date Noted   Neck pain on left side 09/27/2023   Tingling sensation 09/27/2023   Acute foot pain, left 08/29/2023   Lichen simplex  chronicus 08/02/2023   Rash 07/05/2023   Vitamin D  deficiency 06/30/2023   B12 deficiency 06/30/2023   Current use of proton pump inhibitor 06/29/2023   Estrogen deficiency 06/29/2023   Family history of osteoporosis 06/29/2023   External hemorrhoid 09/21/2022   IBS (irritable bowel syndrome) 09/21/2022   Genetic testing 05/26/2022   Routine general medical examination at a health care facility 04/25/2022   Orthopnea 12/30/2020   Lipid screening 06/21/2019   History of anaphylaxis 06/21/2019   Screening mammogram, encounter for 01/07/2019   Encounter for follow-up surveillance of ovarian cancer 10/03/2018   Menopause 09/14/2016   Neoplasm, ovary, malignant, unspecified laterality (HCC) 12/09/2015   Status post laparoscopic hysterectomy 10/29/2014   GERD (gastroesophageal reflux disease) 05/01/2007   PSORIASIS NEC 12/07/2006    PCP: Dr. Laine Balls   REFERRING PROVIDER: Dr. Laine Balls    REFERRING DIAG: M54.2 (ICD-10-CM) - Neck pain on left side  THERAPY DIAG:  Cervicalgia  Radiculopathy, cervical region  Rationale for Evaluation and Treatment: Rehabilitation  ONSET DATE: 07/04/2023    SUBJECTIVE:  SUBJECTIVE STATEMENT: Pt reports painting all weekend, but she is not feeling any worse than usual. She notes that most of the time that she experiences her symptoms when sitting.   Hand dominance: Right  PERTINENT HISTORY:  Pt reports worsening left sided neck pain with radiating numbness and tingling down left arm in the past 3 months. The tingling radiates down into left lateral surface of humerus. She works at a desk all day and she describes being hunched over a computer all day.  It also has radiated up into her left anterior upper trap. She has started using a cervical pillow  when sleeping, and it has helped to significantly reduce pain and symptoms especially when sleeping.   PAIN:  Are you having pain? No, put she feels more tingling and numbness in her left arm.  Pain location: Lateral left shoulder.  Pain description: Achy, intermittent numbness and tingling   Aggravating factors: Getting up in the morning or laying  Relieving factors: Cervical pillow and tylenol    PRECAUTIONS: None  RED FLAGS: Cervical red flags: None       WEIGHT BEARING RESTRICTIONS: No  FALLS:  Has patient fallen in last 6 months? No  LIVING ENVIRONMENT: Lives with: Did not ask  Lives in: Did not ask  Stairs: Did not ask   Has following equipment at home: None  OCCUPATION: Desk   PLOF: Independent  PATIENT GOALS: She wants to feel less pain in her left neck and to not have it stop her from sleeping. She wants to also know how to correct posture and tingling.   NEXT MD VISIT: Did not ask    OBJECTIVE:  Note: Objective measures were completed at Evaluation unless otherwise noted.  VITALS: BP 111/73    DIAGNOSTIC FINDINGS:  M54.2 (ICD-10-CM) - Neck pain on left side  Left side neck pain that is non-radiating   CLINICAL DATA:  left sided neck pain with some radicular symptoms   EXAM: CERVICAL SPINE - COMPLETE 4+ VIEW   COMPARISON:  None Available.   FINDINGS: No fracture, dislocation, or prevertebral soft tissue swelling. Mild narrowing of C3-4 and C5-6 interspaces. Minimal anterolisthesis C4-5.   IMPRESSION: 1. No acute findings. 2. Mild C3-4 and C5-6 disc space narrowing.     Electronically Signed   By: JONETTA Faes M.D.   On: 09/27/2023 10:0  PATIENT SURVEYS:  NDI:  NECK DISABILITY INDEX  Date: 10/11/23 Score  Pain intensity 1 = The pain is very mild at the moment  2. Personal care (washing, dressing, etc.) 0 = I can look after myself normally without causing extra pain  3. Lifting 4 =  I can only lift very light weights  4. Reading 1 = I can read  as much as I want to with slight pain in my neck  5. Headaches 0 = I have no headaches at all  6. Concentration 0 =  I can concentrate fully when I want to with no difficulty  7. Work 0 =  I can do as much work as I want to  8. Driving 0 = I can drive my car without any neck pain  9. Sleeping 2 = My sleep is mildly disturbed (1-2 hrs sleepless)  10. Recreation 1 =  I am able to engage in all my recreation activities, with some pain in my neck  Total (9/50) x 100 = 18%    Minimum Detectable Change (90% confidence): 5 points or 10% points  COGNITION: Overall cognitive status:  Within functional limits for tasks assessed  SENSATION: WFL, intermittent n/t along C3-C5 dermatomes    POSTURE: rounded shoulders  PALPATION: No areas TTP     CERVICAL ROM:   Active ROM A/PROM (deg) eval  Flexion 45  Extension 45  Right lateral flexion 35  Left lateral flexion 45  Right rotation 60  Left rotation 58   (Blank rows = not tested)  UPPER EXTREMITY ROM:  Active ROM Right eval Left eval  Shoulder flexion    Shoulder extension    Shoulder abduction    Shoulder adduction    Shoulder extension    Shoulder internal rotation    Shoulder external rotation    Elbow flexion    Elbow extension    Wrist flexion    Wrist extension    Wrist ulnar deviation    Wrist radial deviation    Wrist pronation    Wrist supination     (Blank rows = not tested)  Combined ER  Occiput  Occiput  Combined IR    PSIS      PSIS   UPPER EXTREMITY MMT:  MMT Right eval Left eval  Shoulder flexion 4+ 4+  Shoulder extension 4+ 4+  Shoulder abduction 4+ 4+  Shoulder adduction    Shoulder extension    Shoulder internal rotation 4 4  Shoulder external rotation 4 4  Middle trapezius 4 4  Lower trapezius 4- 4-  Elbow flexion 4 4  Elbow extension 4 4  Wrist flexion 4 4  Wrist extension 4 4  Wrist ulnar deviation    Wrist radial deviation    Wrist pronation    Wrist supination    Grip strength  Good  Good    (Blank rows = not tested)  CERVICAL SPECIAL TESTS:  Cranial cervical flexion test: NT, Upper limb tension test (ULTT): Positive, and Spurling's test: Negative    TREATMENT DATE:   10/23/23:  THEREX   UBE seat at 8 - 2.5 min forward and 2.5 min backward  Median Nerve glides on LUE 2 x 10  -min VC to sequence exercise.   MANUAL   Left Upper Trap Massage with trigger point release    [facility] [facility]NAG[facility]   Home computer monitor setup video with education  Home computer chair setup video with education   PATIENT EDUCATION:  Education details: Form and technique for correct performance of exercise and explanation of about cervical radiculopathy  Person educated: Patient Education method: Programmer, multimedia, Demonstration, Verbal cues, and Handouts Education comprehension: verbalized understanding, returned demonstration, and verbal cues required  HOME EXERCISE PROGRAM: Access Code: [email or web address] URL: [email or web address] Date: 10/23/2023 Prepared by: Toribio Servant  Exercises - Supine Suboccipital Release with Tennis Balls  - 1 x daily - 7 x weekly - 45-60 sec hold - Median Nerve Flossing - Tray  - 1 x daily - 7 x weekly - 10 reps - 3 sec hold - Seated Cervical Sidebending Stretch  - 1 x daily - 7 x weekly - 2-3 reps - 60 sec  hold - Supine Cervical Retraction with Towel  - 1 x daily - 7 x weekly - 2 sets - 15 reps - 2 sec  hold - Seated Levator Scapulae Stretch  - 1 x daily - 7 x weekly - 2-3 reps - 60 sec  hold - Standing Shoulder Single Arm PNF D2 Flexion with Resistance  - 3-4 x weekly - 3 sets - 10 reps - Standing Bilateral Low Shoulder Row with Anchored Resistance  -  3-4 x weekly - 3 sets - 10 reps - Seated Shoulder Horizontal Abduction with Resistance  - 3-4 x weekly - 3 sets - 10 reps  Patient Education - Setting Up Your Workstation: Monitor - Setting Up Your Workstation: Chair  ASSESSMENT:  CLINICAL IMPRESSION: Pt continues to have  increased tension throughout left upper trap. Reviewed activity modification for patient with desk setup. Pt shows ongoing high motivation and participation in session. She will benefit from skilled PT to address these aforementioned deficits to return to sleeping without excessive pain and discomfort and to maintain health benefits of sleeping.     OBJECTIVE IMPAIRMENTS: decreased ROM, decreased strength, hypomobility, impaired sensation, impaired UE functional use, postural dysfunction, and pain.   ACTIVITY LIMITATIONS: carrying, lifting, sleeping, bed mobility, and reach over head  PARTICIPATION LIMITATIONS: occupation  PERSONAL FACTORS: Age, Fitness, and 1 comorbidity: h/o cancer are also affecting patient's functional outcome.   REHAB POTENTIAL: Good  CLINICAL DECISION MAKING: Stable/uncomplicated  EVALUATION COMPLEXITY: Low   GOALS: Goals reviewed with patient? No  SHORT TERM GOALS: Target date: 10/25/2023  Patient will demonstrate undestanding of home exercise plan by performing exercises correctly with evidence of good carry over with min to no verbal or tactile cues .   Baseline: NT 10/17/23: Performing independently   Goal status: ACHIEVED    2.  Patient will demonstrate understanding of correct ergonomic step for home workstation to prevent abnormal postures and buildup of muscle imbalances.  Baseline: NT  Goal status: ONGOING     LONG TERM GOALS: Target date: 01/03/2024  Patient will reduce neck disability index by >=5 pts or 10% as evidence of improved neck function that is not due to statistical error or chance. Baseline: 9/50 (18%)  Goal status: ONGOING    2.  Patient will improve right side bend AROM to be within normal limits as evidence of improved muscular tension and cervical function. Baseline: Cervical SB R/L 35/45   Goal status: ONGOING    3.  Patient will improve periscapular strength by 1/3 grade MMT (ie 4- to 4) for improved cervical stability and  posture to decrease incidence of left sided neck pain and radicular symptoms. Baseline: Lower Trap  R/L 4-/4-, Mid Trap 4/4   Goal status: ONGOING       PLAN:  PT FREQUENCY: 1-2x/week  PT DURATION: 12 weeks  PLANNED INTERVENTIONS: 97164- PT Re-evaluation, 97750- Physical Performance Testing, 97110-Therapeutic exercises, 97530- Therapeutic activity, [id number]- Neuromuscular re-education, 97535- Self Care, [id number]- Manual therapy, [phone]- Aquatic Therapy, [id number]- Electrical stimulation (unattended), [phone]- Electrical stimulation (manual), [id number]- Traction (mechanical), 20560 (1-2 muscles), 20561 (3+ muscles)- Dry Needling, Patient/Family education, Joint mobilization, Joint manipulation, Spinal manipulation, Spinal mobilization, Vestibular training, Cryotherapy, and Moist heat  PLAN FOR NEXT SESSION:  Nerve glides. Face pulls and lat pull down. Progress other periscapular exercises on HEP.   Toribio Servant PT, DPT  [facility] [address] [place], [address] Phone: [phone]   Fax:  [phone]

## 2023-10-25 ENCOUNTER — Ambulatory Visit: Admitting: Physical Therapy

## 2023-10-27 DIAGNOSIS — H00014 Hordeolum externum left upper eyelid: Secondary | ICD-10-CM | POA: Diagnosis not present

## 2023-11-07 ENCOUNTER — Ambulatory Visit: Admitting: Physical Therapy

## 2023-11-14 ENCOUNTER — Ambulatory Visit: Attending: Family Medicine | Admitting: Physical Therapy

## 2023-11-14 DIAGNOSIS — M542 Cervicalgia: Secondary | ICD-10-CM | POA: Diagnosis not present

## 2023-11-14 DIAGNOSIS — M5412 Radiculopathy, cervical region: Secondary | ICD-10-CM | POA: Diagnosis not present

## 2023-11-14 NOTE — Therapy (Signed)
 OUTPATIENT PHYSICAL THERAPY CERVICAL TREATMENT    Patient Name: Debbie Richardson MRN: [id number] DOB:[birth year], 63 y.o., female Today's Date: 11/14/2023  END OF SESSION:  PT End of Session - 11/14/23 1642     Visit Number 5    Number of Visits 24    Date for PT Re-Evaluation 01/03/24    Authorization Type [id number]    Authorization - Visit Number 5    Authorization - Number of Visits 24    Progress Note Due on Visit 10    PT Start Time 1645    PT Stop Time 1730    PT Time Calculation (min) 45 min    Activity Tolerance Patient tolerated treatment well    Behavior During Therapy WFL for tasks assessed/performed           Past Medical History:  Diagnosis Date   Anxiety    Arthritis    Benign fundic gland polyps of stomach    Dizziness    vertigo   Gallstones    GERD (gastroesophageal reflux disease)    Ovarian cancer (HCC) 2016   Personal history of chemotherapy    Routine general medical examination at a health care facility 04/25/2022   Past Surgical History:  Procedure Laterality Date   CESAREAN SECTION     CHOLECYSTECTOMY     COLONOSCOPY     COLONOSCOPY WITH PROPOFOL  N/A 04/29/2021   Procedure: COLONOSCOPY WITH PROPOFOL ;  Surgeon: Therisa Bi, MD;  Location: [facility] ENDOSCOPY;  Service: Gastroenterology;  Laterality: N/A;   DIAGNOSTIC LAPAROSCOPY     LAPAROSCOPIC HYSTERECTOMY N/A 10/29/2014   Procedure: HYSTERECTOMY TOTAL LAPAROSCOPIC, OMENTUMECTOMY;  Surgeon: Webb Isidor Constable, MD;  Location: [facility] ORS;  Service: Gynecology;  Laterality: N/A;   LAPAROSCOPIC OOPHERECTOMY     OOPHORECTOMY     PORTA CATH REMOVAL N/A 04/26/2018   Procedure: PORTA CATH REMOVAL;  Surgeon: Marea Selinda RAMAN, MD;  Location: [facility] INVASIVE CV LAB;  Service: Cardiovascular;  Laterality: N/A;   UPPER GI ENDOSCOPY     Patient Active Problem List   Diagnosis Date Noted   Neck pain on left side 09/27/2023   Tingling sensation 09/27/2023   Acute foot pain, left 08/29/2023   Lichen simplex  chronicus 08/02/2023   Rash 07/05/2023   Vitamin D  deficiency 06/30/2023   B12 deficiency 06/30/2023   Current use of proton pump inhibitor 06/29/2023   Estrogen deficiency 06/29/2023   Family history of osteoporosis 06/29/2023   External hemorrhoid 09/21/2022   IBS (irritable bowel syndrome) 09/21/2022   Genetic testing 05/26/2022   Routine general medical examination at a health care facility 04/25/2022   Orthopnea 12/30/2020   Lipid screening 06/21/2019   History of anaphylaxis 06/21/2019   Screening mammogram, encounter for 01/07/2019   Encounter for follow-up surveillance of ovarian cancer 10/03/2018   Menopause 09/14/2016   Neoplasm, ovary, malignant, unspecified laterality (HCC) 12/09/2015   Status post laparoscopic hysterectomy 10/29/2014   GERD (gastroesophageal reflux disease) 05/01/2007   PSORIASIS NEC 12/07/2006    PCP: Dr. Laine Balls   REFERRING PROVIDER: Dr. Laine Balls    REFERRING DIAG: M54.2 (ICD-10-CM) - Neck pain on left side  THERAPY DIAG:  Cervicalgia  Radiculopathy, cervical region  Rationale for Evaluation and Treatment: Rehabilitation  ONSET DATE: 07/04/2023    SUBJECTIVE:  SUBJECTIVE STATEMENT: Pt states that she has noticed improvements in her symptoms with a decrease in numbness and tingling in the left side of her neck and arm. She has changed her sitting posture and started to work less at home to avoid postures that exacerbate her symptoms.    Hand dominance: Right  PERTINENT HISTORY:  Pt reports worsening left sided neck pain with radiating numbness and tingling down left arm in the past 3 months. The tingling radiates down into left lateral surface of humerus. She works at a desk all day and she describes being hunched over a computer all day.   It also has radiated up into her left anterior upper trap. She has started using a cervical pillow when sleeping, and it has helped to significantly reduce pain and symptoms especially when sleeping.   PAIN:  Are you having pain? No, put she feels more tingling and numbness in her left arm.  Pain location: Lateral left shoulder.  Pain description: Achy, intermittent numbness and tingling   Aggravating factors: Getting up in the morning or laying  Relieving factors: Cervical pillow and tylenol    PRECAUTIONS: None  RED FLAGS: Cervical red flags: None       WEIGHT BEARING RESTRICTIONS: No  FALLS:  Has patient fallen in last 6 months? No  LIVING ENVIRONMENT: Lives with: Did not ask  Lives in: Did not ask  Stairs: Did not ask   Has following equipment at home: None  OCCUPATION: Desk   PLOF: Independent  PATIENT GOALS: She wants to feel less pain in her left neck and to not have it stop her from sleeping. She wants to also know how to correct posture and tingling.   NEXT MD VISIT: Did not ask    OBJECTIVE:  Note: Objective measures were completed at Evaluation unless otherwise noted.  VITALS: BP 111/73    DIAGNOSTIC FINDINGS:  M54.2 (ICD-10-CM) - Neck pain on left side  Left side neck pain that is non-radiating   CLINICAL DATA:  left sided neck pain with some radicular symptoms   EXAM: CERVICAL SPINE - COMPLETE 4+ VIEW   COMPARISON:  None Available.   FINDINGS: No fracture, dislocation, or prevertebral soft tissue swelling. Mild narrowing of C3-4 and C5-6 interspaces. Minimal anterolisthesis C4-5.   IMPRESSION: 1. No acute findings. 2. Mild C3-4 and C5-6 disc space narrowing.     Electronically Signed   By: JONETTA Faes M.D.   On: 09/27/2023 10:0  PATIENT SURVEYS:  NDI:  NECK DISABILITY INDEX  Date: 10/11/23 Score  Pain intensity 1 = The pain is very mild at the moment  2. Personal care (washing, dressing, etc.) 0 = I can look after myself normally without  causing extra pain  3. Lifting 4 =  I can only lift very light weights  4. Reading 1 = I can read as much as I want to with slight pain in my neck  5. Headaches 0 = I have no headaches at all  6. Concentration 0 =  I can concentrate fully when I want to with no difficulty  7. Work 0 =  I can do as much work as I want to  8. Driving 0 = I can drive my car without any neck pain  9. Sleeping 2 = My sleep is mildly disturbed (1-2 hrs sleepless)  10. Recreation 1 =  I am able to engage in all my recreation activities, with some pain in my neck  Total (9/50) x 100 =  18%    Minimum Detectable Change (90% confidence): 5 points or 10% points  COGNITION: Overall cognitive status: Within functional limits for tasks assessed  SENSATION: WFL, intermittent n/t along C3-C5 dermatomes    POSTURE: rounded shoulders  PALPATION: No areas TTP     CERVICAL ROM:   Active ROM A/PROM (deg) eval A/PROM  11/14/23  Flexion 45   Extension 45   Right lateral flexion 35 40  Left lateral flexion 45   Right rotation 60   Left rotation 58    (Blank rows = not tested)  UPPER EXTREMITY ROM:  Active ROM Right eval Left eval  Shoulder flexion    Shoulder extension    Shoulder abduction    Shoulder adduction    Shoulder extension    Shoulder internal rotation    Shoulder external rotation    Elbow flexion    Elbow extension    Wrist flexion    Wrist extension    Wrist ulnar deviation    Wrist radial deviation    Wrist pronation    Wrist supination     (Blank rows = not tested)  Combined ER  Occiput  Occiput  Combined IR    PSIS      PSIS   UPPER EXTREMITY MMT:  MMT Right eval Left eval  Shoulder flexion 4+ 4+  Shoulder extension 4+ 4+  Shoulder abduction 4+ 4+  Shoulder adduction    Shoulder extension    Shoulder internal rotation 4 4  Shoulder external rotation 4 4  Middle trapezius 4 4  Lower trapezius 4- 4-  Elbow flexion 4 4  Elbow extension 4 4  Wrist flexion 4 4  Wrist  extension 4 4  Wrist ulnar deviation    Wrist radial deviation    Wrist pronation    Wrist supination    Grip strength Good  Good    (Blank rows = not tested)  CERVICAL SPECIAL TESTS:  Cranial cervical flexion test: NT, Upper limb tension test (ULTT): Positive, and Spurling's test: Negative    TREATMENT DATE:   11/14/23:  THEREX   UBE seat at 8 - 2.5 min forward and 2.5 min backward  Bent Over Y's #1 DB 2 x 10   Bent Over Y's #2 DB 2 x 10  -min VC decrease speed of exercise for improved strengthening. Bent Over T's #2 DB 2 x 10    Face Pulls with red band 2 x 10  -min VC to decrease speed of eccentric phase   Neck Disability Index (NDI):  9/50 (18%) Seated Chin Tucks 1 x 10  Standing Single UE Rows with Black Band 2 x 10  -min VC to decrease speed of eccentric phase       PATIENT EDUCATION:  Education details: Form and technique for correct performance of exercise and explanation of about cervical radiculopathy  Person educated: Patient Education method: Programmer, multimedia, Demonstration, Verbal cues, and Handouts Education comprehension: verbalized understanding, returned demonstration, and verbal cues required  HOME EXERCISE PROGRAM: Access Code: [email or web address] URL: [email or web address] Date: 11/14/2023 Prepared by: Toribio Servant  Exercises - Supine Suboccipital Release with Tennis Balls  - 1 x daily - 7 x weekly - 45-60 sec hold - Median Nerve Flossing - Tray  - 1 x daily - 7 x weekly - 10 reps - 3 sec hold - Seated Cervical Sidebending Stretch  - 1 x daily - 7 x weekly - 2-3 reps - 60 sec  hold - Seated Cervical Retraction  - 3-4  x weekly - 3 sets - 10 reps - 2 sec  hold - Seated Levator Scapulae Stretch  - 1 x daily - 7 x weekly - 2-3 reps - 60 sec  hold - Shoulder Bent over Y's   - 3-4 x weekly - 3 sets - 10 reps - Standing Forward-Bent Horizontal Abduction With Dumbbells  - 3-4 x weekly - 3 sets - 10 reps - Face Pulls  - 3-4 x weekly - 3 sets - 10 reps -  Standing Single Arm Low Row with Anchored Resistance  - 3-4 x weekly - 3 sets - 10 reps  Patient Education - Setting Up Your Workstation: Monitor - Setting Up Your Workstation: Chair   ASSESSMENT:  CLINICAL IMPRESSION: Pt shows improvement in cervical mobility and her perception of neck disability has remained unchanged despite subjectively reporting improvement in neck function and decrease in symptoms. She also demonstrates improvement in periscapular strength as evidenced by ability to perform progressed exercises without compensation or an increase in her pain. She will continue tobenefit from skilled PT to address these aforementioned deficits to return to sleeping without excessive pain and discomfort and to maintain health benefits of sleeping.    OBJECTIVE IMPAIRMENTS: decreased ROM, decreased strength, hypomobility, impaired sensation, impaired UE functional use, postural dysfunction, and pain.   ACTIVITY LIMITATIONS: carrying, lifting, sleeping, bed mobility, and reach over head  PARTICIPATION LIMITATIONS: occupation  PERSONAL FACTORS: Age, Fitness, and 1 comorbidity: h/o cancer are also affecting patient's functional outcome.   REHAB POTENTIAL: Good  CLINICAL DECISION MAKING: Stable/uncomplicated  EVALUATION COMPLEXITY: Low   GOALS: Goals reviewed with patient? No  SHORT TERM GOALS: Target date: 10/25/2023  Patient will demonstrate undestanding of home exercise plan by performing exercises correctly with evidence of good carry over with min to no verbal or tactile cues .   Baseline: NT 10/17/23: Performing independently   Goal status: ACHIEVED    2.  Patient will demonstrate understanding of correct ergonomic step for home workstation to prevent abnormal postures and buildup of muscle imbalances.  Baseline: NT 10/25/23: Reviewed work place setup and modifications she could make to improve posture   Goal status: ACHIEVED        LONG TERM GOALS: Target date:  01/03/2024  Patient will reduce neck disability index by >=5 pts or 10% as evidence of improved neck function that is not due to statistical error or chance. Baseline: 9/50 (18%) 11/14/23: 9/50 (18%) Goal status: ONGOING    2.  Patient will improve right side bend AROM to be within normal limits as evidence of improved muscular tension and cervical function. Baseline: Cervical SB R/L 35/45  11/14/23: Cervical SB R 40 deg   Goal status: PARTIALLY MET    3.  Patient will improve periscapular strength by 1/3 grade MMT (ie 4- to 4) for improved cervical stability and posture to decrease incidence of left sided neck pain and radicular symptoms. Baseline: Lower Trap  R/L 4-/4-, Mid Trap 4/4   Goal status: ONGOING       PLAN:  PT FREQUENCY: 1-2x/week  PT DURATION: 12 weeks  PLANNED INTERVENTIONS: [id number]- PT Re-evaluation, 97750- Physical Performance Testing, 97110-Therapeutic exercises, 97530- Therapeutic activity, [id number]- Neuromuscular re-education, 97535- Self Care, [id number]- Manual therapy, [phone]- Aquatic Therapy, [id number]- Electrical stimulation (unattended), [phone]- Electrical stimulation (manual), [id number]- Traction (mechanical), 20560 (1-2 muscles), [id number] (3+ muscles)- Dry Needling, Patient/Family education, Joint mobilization, Joint manipulation, Spinal manipulation, Spinal mobilization, Vestibular training, Cryotherapy, and Moist heat  PLAN FOR NEXT SESSION:  Lat pull  downs and side lying ER/IR. Measure PROM cervical R SB. Measure periscapular MMT.   Toribio Servant PT, DPT  [facility] [address] [place], [address] Phone: [phone]   Fax:  [phone]

## 2023-11-16 ENCOUNTER — Ambulatory Visit: Admitting: Physical Therapy

## 2023-11-17 DIAGNOSIS — L648 Other androgenic alopecia: Secondary | ICD-10-CM | POA: Diagnosis not present

## 2023-11-17 DIAGNOSIS — L821 Other seborrheic keratosis: Secondary | ICD-10-CM | POA: Diagnosis not present

## 2023-11-17 DIAGNOSIS — Z1283 Encounter for screening for malignant neoplasm of skin: Secondary | ICD-10-CM | POA: Diagnosis not present

## 2023-11-17 DIAGNOSIS — D225 Melanocytic nevi of trunk: Secondary | ICD-10-CM | POA: Diagnosis not present

## 2023-11-20 ENCOUNTER — Ambulatory Visit: Admitting: Physical Therapy

## 2023-11-22 ENCOUNTER — Ambulatory Visit: Admitting: Physical Therapy

## 2023-11-22 ENCOUNTER — Ambulatory Visit: Payer: BC Managed Care – PPO

## 2023-11-22 ENCOUNTER — Other Ambulatory Visit: Payer: BC Managed Care – PPO

## 2023-11-22 DIAGNOSIS — M542 Cervicalgia: Secondary | ICD-10-CM

## 2023-11-22 DIAGNOSIS — M5412 Radiculopathy, cervical region: Secondary | ICD-10-CM

## 2023-11-22 NOTE — Therapy (Signed)
 OUTPATIENT PHYSICAL THERAPY CERVICAL TREATMENT    Patient Name: Debbie Richardson MRN: [id number] DOB:1960-06-27, 63 y.o., female Today's Date: 11/22/2023  END OF SESSION:  PT End of Session - 11/22/23 1435     Visit Number 6    Number of Visits 24    Date for PT Re-Evaluation 01/03/24    Authorization Type [id number]    Authorization - Visit Number 6    Authorization - Number of Visits 24    Progress Note Due on Visit 10    PT Start Time 1430    PT Stop Time 1500    PT Time Calculation (min) 30 min    Activity Tolerance Patient tolerated treatment well    Behavior During Therapy WFL for tasks assessed/performed            Past Medical History:  Diagnosis Date   Anxiety    Arthritis    Benign fundic gland polyps of stomach    Dizziness    vertigo   Gallstones    GERD (gastroesophageal reflux disease)    Ovarian cancer (HCC) 2016   Personal history of chemotherapy    Routine general medical examination at a health care facility 04/25/2022   Past Surgical History:  Procedure Laterality Date   CESAREAN SECTION     CHOLECYSTECTOMY     COLONOSCOPY     COLONOSCOPY WITH PROPOFOL  N/A 04/29/2021   Procedure: COLONOSCOPY WITH PROPOFOL ;  Surgeon: Therisa Bi, MD;  Location: [facility] ENDOSCOPY;  Service: Gastroenterology;  Laterality: N/A;   DIAGNOSTIC LAPAROSCOPY     LAPAROSCOPIC HYSTERECTOMY N/A 10/29/2014   Procedure: HYSTERECTOMY TOTAL LAPAROSCOPIC, OMENTUMECTOMY;  Surgeon: Webb Isidor Constable, MD;  Location: [facility] ORS;  Service: Gynecology;  Laterality: N/A;   LAPAROSCOPIC OOPHERECTOMY     OOPHORECTOMY     PORTA CATH REMOVAL N/A 04/26/2018   Procedure: PORTA CATH REMOVAL;  Surgeon: Marea Selinda RAMAN, MD;  Location: [facility] INVASIVE CV LAB;  Service: Cardiovascular;  Laterality: N/A;   UPPER GI ENDOSCOPY     Patient Active Problem List   Diagnosis Date Noted   Neck pain on left side 09/27/2023   Tingling sensation 09/27/2023   Acute foot pain, left 08/29/2023   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   Rash 07/05/2023   Vitamin D  deficiency 06/30/2023   B12 deficiency 06/30/2023   Current use of proton pump inhibitor 06/29/2023   Estrogen deficiency 06/29/2023   Family history of osteoporosis 06/29/2023   External hemorrhoid 09/21/2022   IBS (irritable bowel syndrome) 09/21/2022   Genetic testing 05/26/2022   Routine general medical examination at a health care facility 04/25/2022   Orthopnea 12/30/2020   Lipid screening 06/21/2019   History of anaphylaxis 06/21/2019   Screening mammogram, encounter for 01/07/2019   Encounter for follow-up surveillance of ovarian cancer 10/03/2018   Menopause 09/14/2016   Neoplasm, ovary, malignant, unspecified laterality (HCC) 12/09/2015   Status post laparoscopic hysterectomy 10/29/2014   GERD (gastroesophageal reflux disease) 05/01/2007   PSORIASIS NEC 12/07/2006    PCP: Dr. Laine Balls   REFERRING PROVIDER: Dr. Laine Balls    REFERRING DIAG: M54.2 (ICD-10-CM) - Neck pain on left side  THERAPY DIAG:  Cervicalgia  Radiculopathy, cervical region  Rationale for Evaluation and Treatment: Rehabilitation  ONSET DATE: 07/04/2023    SUBJECTIVE:  SUBJECTIVE STATEMENT: Pt reports ongoing decrease in neck symptoms. She only had one exercise with periscapular exercise that she felt an increase in her symptoms but the nerve glides helped to calm down the symptoms in the left side of her neck.   Hand dominance: Right  PERTINENT HISTORY:  Pt reports worsening left sided neck pain with radiating numbness and tingling down left arm in the past 3 months. The tingling radiates down into left lateral surface of humerus. She works at a desk all day and she describes being hunched over a computer all day.  It also has radiated up  into her left anterior upper trap. She has started using a cervical pillow when sleeping, and it has helped to significantly reduce pain and symptoms especially when sleeping.   PAIN:  Are you having pain? No, put she feels more tingling and numbness in her left arm.  Pain location: Lateral left shoulder.  Pain description: Achy, intermittent numbness and tingling   Aggravating factors: Getting up in the morning or laying  Relieving factors: Cervical pillow and tylenol    PRECAUTIONS: None  RED FLAGS: Cervical red flags: None       WEIGHT BEARING RESTRICTIONS: No  FALLS:  Has patient fallen in last 6 months? No  LIVING ENVIRONMENT: Lives with: Did not ask  Lives in: Did not ask  Stairs: Did not ask   Has following equipment at home: None  OCCUPATION: Desk   PLOF: Independent  PATIENT GOALS: She wants to feel less pain in her left neck and to not have it stop her from sleeping. She wants to also know how to correct posture and tingling.   NEXT MD VISIT: Did not ask    OBJECTIVE:  Note: Objective measures were completed at Evaluation unless otherwise noted.  VITALS: BP 111/73    DIAGNOSTIC FINDINGS:  M54.2 (ICD-10-CM) - Neck pain on left side  Left side neck pain that is non-radiating   CLINICAL DATA:  left sided neck pain with some radicular symptoms   EXAM: CERVICAL SPINE - COMPLETE 4+ VIEW   COMPARISON:  None Available.   FINDINGS: No fracture, dislocation, or prevertebral soft tissue swelling. Mild narrowing of C3-4 and C5-6 interspaces. Minimal anterolisthesis C4-5.   IMPRESSION: 1. No acute findings. 2. Mild C3-4 and C5-6 disc space narrowing.     Electronically Signed   By: JONETTA Faes M.D.   On: 09/27/2023 10:0  PATIENT SURVEYS:  NDI:  NECK DISABILITY INDEX  Date: 10/11/23 Score  Pain intensity 1 = The pain is very mild at the moment  2. Personal care (washing, dressing, etc.) 0 = I can look after myself normally without causing extra pain  3.  Lifting 4 =  I can only lift very light weights  4. Reading 1 = I can read as much as I want to with slight pain in my neck  5. Headaches 0 = I have no headaches at all  6. Concentration 0 =  I can concentrate fully when I want to with no difficulty  7. Work 0 =  I can do as much work as I want to  8. Driving 0 = I can drive my car without any neck pain  9. Sleeping 2 = My sleep is mildly disturbed (1-2 hrs sleepless)  10. Recreation 1 =  I am able to engage in all my recreation activities, with some pain in my neck  Total (9/50) x 100 = 18%    Minimum Detectable Change (  90% confidence): 5 points or 10% points  COGNITION: Overall cognitive status: Within functional limits for tasks assessed  SENSATION: WFL, intermittent n/t along C3-C5 dermatomes    POSTURE: rounded shoulders  PALPATION: No areas TTP     CERVICAL ROM:   Active ROM A/PROM (deg) eval A/PROM  11/14/23 A/PROM 11/22/23  Flexion 45    Extension 45    Right lateral flexion 35 40 45/45  Left lateral flexion 45    Right rotation 60    Left rotation 58     (Blank rows = not tested)  UPPER EXTREMITY ROM:  Active ROM Right eval Left eval  Shoulder flexion    Shoulder extension    Shoulder abduction    Shoulder adduction    Shoulder extension    Shoulder internal rotation    Shoulder external rotation    Elbow flexion    Elbow extension    Wrist flexion    Wrist extension    Wrist ulnar deviation    Wrist radial deviation    Wrist pronation    Wrist supination     (Blank rows = not tested)  Combined ER  Occiput  Occiput  Combined IR    PSIS      PSIS   UPPER EXTREMITY MMT:  MMT Right eval Left eval Right  11/22/23 Left  11/22/23    Shoulder flexion 4+ 4+    Shoulder extension 4+ 4+    Shoulder abduction 4+ 4+    Shoulder adduction      Shoulder extension      Shoulder internal rotation 4 4 4+ 4+  Shoulder external rotation 4 4 4+ 4+  Middle trapezius 4 4 4+ 4+  Lower trapezius 4- 4- 4 4   Elbow flexion 4 4    Elbow extension 4 4    Wrist flexion 4 4    Wrist extension 4 4    Wrist ulnar deviation      Wrist radial deviation      Wrist pronation      Wrist supination      Grip strength Good  Good      (Blank rows = not tested)  CERVICAL SPECIAL TESTS:  Cranial cervical flexion test: NT, Upper limb tension test (ULTT): Positive, and Spurling's test: Negative    TREATMENT DATE:   11/22/23: THEREX   UBE seat at 8 - 2.5 min forward and 2.5 min backward  Shoulder Horizontal Abduction with red band 1 x 10   Shoulder Horizontal Abduction with green band 1 x 10   -min VC to decrease speed of eccentric phase    Cervical ROM (See above) Periscapular Strength (See above) Lat Pull Down with green band 1 x 10   Lat Pull Down with blue band  1 x 10     PATIENT EDUCATION:  Education details: Form and technique for correct performance of exercise and explanation of about cervical radiculopathy  Person educated: Patient Education method: Explanation, Demonstration, Verbal cues, and Handouts Education comprehension: verbalized understanding, returned demonstration, and verbal cues required  HOME EXERCISE PROGRAM: Access Code: [email or web address] URL: [email or web address] Date: 11/22/2023 Prepared by: Toribio Servant  Exercises - Supine Suboccipital Release with Tennis Balls  - 1 x daily - 7 x weekly - 45-60 sec hold - Median Nerve Flossing - Tray  - 1 x daily - 7 x weekly - 10 reps - 3 sec hold - Seated Cervical Sidebending Stretch  - 1 x daily - 7 x weekly - 2-3  reps - 60 sec  hold - Seated Cervical Retraction  - 3-4 x weekly - 3 sets - 10 reps - 2 sec  hold - Seated Levator Scapulae Stretch  - 1 x daily - 7 x weekly - 2-3 reps - 60 sec  hold - Shoulder Bent over Y's   - 3-4 x weekly - 3 sets - 10 reps - Face Pulls  - 3-4 x weekly - 3 sets - 10 reps - Standing Single Arm Low Row with Anchored Resistance  - 3-4 x weekly - 3 sets - 10 reps - Standing Shoulder  Horizontal Abduction with Resistance  - 3-4 x weekly - 3 sets - 10 reps - Lat Pull Down   - 3-4 x weekly - 3 sets - 10 reps  Patient Education - Setting Up Your Workstation: Monitor - Setting Up Your Workstation: Chair   ASSESSMENT:  CLINICAL IMPRESSION: Pt progressing towards rehab goals with improvement in cervical mobility and shoulder and periscapular strength. She continues to have lower pain and symptoms for the past several weeks. She is nearing the end of her plan of care with the only remaining goal being her perception of her cervical function. She will continue tobenefit from skilled PT to address these aforementioned deficits to return to sleeping without excessive pain and discomfort and to maintain health benefits of sleeping.   OBJECTIVE IMPAIRMENTS: decreased ROM, decreased strength, hypomobility, impaired sensation, impaired UE functional use, postural dysfunction, and pain.   ACTIVITY LIMITATIONS: carrying, lifting, sleeping, bed mobility, and reach over head  PARTICIPATION LIMITATIONS: occupation  PERSONAL FACTORS: Age, Fitness, and 1 comorbidity: h/o cancer are also affecting patient's functional outcome.   REHAB POTENTIAL: Good  CLINICAL DECISION MAKING: Stable/uncomplicated  EVALUATION COMPLEXITY: Low   GOALS: Goals reviewed with patient? No  SHORT TERM GOALS: Target date: 10/25/2023  Patient will demonstrate undestanding of home exercise plan by performing exercises correctly with evidence of good carry over with min to no verbal or tactile cues .   Baseline: NT 10/17/23: Performing independently   Goal status: ACHIEVED    2.  Patient will demonstrate understanding of correct ergonomic step for home workstation to prevent abnormal postures and buildup of muscle imbalances.  Baseline: NT 10/25/23: Reviewed work place setup and modifications she could make to improve posture   Goal status: ACHIEVED        LONG TERM GOALS: Target date: 01/03/2024  Patient  will reduce neck disability index by >=5 pts or 10% as evidence of improved neck function that is not due to statistical error or chance. Baseline: 9/50 (18%) 11/14/23: 9/50 (18%) Goal status: ONGOING    2.  Patient will improve right side bend AROM to be within normal limits as evidence of improved muscular tension and cervical function. Baseline: Cervical SB R/L 35/45  11/14/23: Cervical SB R 40 deg 11/22/23: Cervical R SB 45/45   Goal status: ACHIEVED     3.  Patient will improve periscapular strength by 1/3 grade MMT (ie 4- to 4) for improved cervical stability and posture to decrease incidence of left sided neck pain and radicular symptoms. Baseline: Lower Trap  R/L 4-/4-, Mid Trap 4/4  11/22/23: Lower Trap R/L 4/4 11/22/23: Lower Trap R/L 4/4, Mid Trap R/L 4+/4+, Shoulder ER at 0 deg R/L 4+/4+, Shoulder IR at 0 deg R/L 4+/4+   Goal status: ACHIEVED       PLAN:  PT FREQUENCY: 1-2x/week  PT DURATION: 12 weeks  PLANNED INTERVENTIONS: 02835- PT Re-evaluation, 97750-  Physical Performance Testing, 97110-Therapeutic exercises, 97530- Therapeutic activity, [id number]- Neuromuscular re-education, [phone]- Self Care, 02859- Manual therapy, [phone]- Aquatic Therapy, [phone]- Electrical stimulation (unattended), [phone]- Electrical stimulation (manual), [id number]- Traction (mechanical), [phone] (1-2 muscles), [phone] (3+ muscles)- Dry Needling, Patient/Family education, Joint mobilization, Joint manipulation, Spinal manipulation, Spinal mobilization, Vestibular training, Cryotherapy, and Moist heat  PLAN FOR NEXT SESSION:  Take Neck Disability Index. Side lying ER/IR at 0 deg abduction. Begin to introduce UE machine exercises  Toribio Servant PT, DPT  [facility] [address], [place], [address] Phone: [phone]   Fax:  [phone]

## 2023-11-23 ENCOUNTER — Other Ambulatory Visit: Payer: BC Managed Care – PPO

## 2023-11-23 ENCOUNTER — Ambulatory Visit: Admitting: Physical Therapy

## 2023-11-28 ENCOUNTER — Ambulatory Visit: Admitting: Physical Therapy

## 2023-11-30 ENCOUNTER — Ambulatory Visit: Admitting: Physical Therapy

## 2023-12-05 ENCOUNTER — Ambulatory Visit: Admitting: Physical Therapy

## 2023-12-07 ENCOUNTER — Ambulatory Visit: Admitting: Physical Therapy

## 2023-12-11 ENCOUNTER — Ambulatory Visit: Admitting: Physical Therapy

## 2023-12-11 ENCOUNTER — Telehealth: Payer: Self-pay | Admitting: Physical Therapy

## 2023-12-11 NOTE — Telephone Encounter (Signed)
 Called pt to inquire about absence from apt. She reports that she had previously requested that all of her Monday apts be cancelled and that today's apt should have already been cancelled. PT apologized for miscommunication and confirmed that she would not be counted as a No Show and that all of her Monday appointments would now be cancelled and confirmed that her next apt was this Thursday.

## 2023-12-14 ENCOUNTER — Ambulatory Visit: Attending: Family Medicine | Admitting: Physical Therapy

## 2023-12-14 DIAGNOSIS — M5412 Radiculopathy, cervical region: Secondary | ICD-10-CM | POA: Insufficient documentation

## 2023-12-14 DIAGNOSIS — M542 Cervicalgia: Secondary | ICD-10-CM | POA: Insufficient documentation

## 2023-12-14 NOTE — Therapy (Addendum)
 OUTPATIENT PHYSICAL THERAPY CERVICAL DISCHARGE     Patient Name: Debbie Richardson MRN: [id number] DOB:02-29-1961, 63 y.o., female Today's Date: 12/14/2023  END OF SESSION:  PT End of Session - 12/14/23 1123     Visit Number 7    Number of Visits 24    Date for PT Re-Evaluation 01/03/24    Authorization Type [id number]    Authorization - Visit Number 7    Authorization - Number of Visits 24    Progress Note Due on Visit 10    PT Start Time 1120    PT Stop Time 1200    PT Time Calculation (min) 40 min    Activity Tolerance Patient tolerated treatment well    Behavior During Therapy WFL for tasks assessed/performed             Past Medical History:  Diagnosis Date   Anxiety    Arthritis    Benign fundic gland polyps of stomach    Dizziness    vertigo   Gallstones    GERD (gastroesophageal reflux disease)    Ovarian cancer (HCC) 2016   Personal history of chemotherapy    Routine general medical examination at a health care facility 04/25/2022   Past Surgical History:  Procedure Laterality Date   CESAREAN SECTION     CHOLECYSTECTOMY     COLONOSCOPY     COLONOSCOPY WITH PROPOFOL  N/A 04/29/2021   Procedure: COLONOSCOPY WITH PROPOFOL ;  Surgeon: Therisa Bi, MD;  Location: [facility] ENDOSCOPY;  Service: Gastroenterology;  Laterality: N/A;   DIAGNOSTIC LAPAROSCOPY     LAPAROSCOPIC HYSTERECTOMY N/A 10/29/2014   Procedure: HYSTERECTOMY TOTAL LAPAROSCOPIC, OMENTUMECTOMY;  Surgeon: Webb Isidor Constable, MD;  Location: [facility] ORS;  Service: Gynecology;  Laterality: N/A;   LAPAROSCOPIC OOPHERECTOMY     OOPHORECTOMY     PORTA CATH REMOVAL N/A 04/26/2018   Procedure: PORTA CATH REMOVAL;  Surgeon: Marea Selinda RAMAN, MD;  Location: [facility] INVASIVE CV LAB;  Service: Cardiovascular;  Laterality: N/A;   UPPER GI ENDOSCOPY     Patient Active Problem List   Diagnosis Date Noted   Neck pain on left side 09/27/2023   Tingling sensation 09/27/2023   Acute foot pain, left 08/29/2023   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   Rash 07/05/2023   Vitamin D  deficiency 06/30/2023   B12 deficiency 06/30/2023   Current use of proton pump inhibitor 06/29/2023   Estrogen deficiency 06/29/2023   Family history of osteoporosis 06/29/2023   External hemorrhoid 09/21/2022   IBS (irritable bowel syndrome) 09/21/2022   Genetic testing 05/26/2022   Routine general medical examination at a health care facility 04/25/2022   Orthopnea 12/30/2020   Lipid screening 06/21/2019   History of anaphylaxis 06/21/2019   Screening mammogram, encounter for 01/07/2019   Encounter for follow-up surveillance of ovarian cancer 10/03/2018   Menopause 09/14/2016   Neoplasm, ovary, malignant, unspecified laterality (HCC) 12/09/2015   Status post laparoscopic hysterectomy 10/29/2014   GERD (gastroesophageal reflux disease) 05/01/2007   PSORIASIS NEC 12/07/2006    PCP: Dr. Laine Balls   REFERRING PROVIDER: Dr. Laine Balls    REFERRING DIAG: M54.2 (ICD-10-CM) - Neck pain on left side  THERAPY DIAG:  Cervicalgia  Radiculopathy, cervical region  Rationale for Evaluation and Treatment: Rehabilitation  ONSET DATE: 07/04/2023    SUBJECTIVE:  SUBJECTIVE STATEMENT: Pt states that she continues to feel decreased pain in her neck and she continues to perform exercises without difficulty.    Hand dominance: Right  PERTINENT HISTORY:  Pt reports worsening left sided neck pain with radiating numbness and tingling down left arm in the past 3 months. The tingling radiates down into left lateral surface of humerus. She works at a desk all day and she describes being hunched over a computer all day.  It also has radiated up into her left anterior upper trap. She has started using a cervical pillow when sleeping, and it has  helped to significantly reduce pain and symptoms especially when sleeping.   PAIN:  Are you having pain? No, put she feels more tingling and numbness in her left arm.  Pain location: Lateral left shoulder.  Pain description: Achy, intermittent numbness and tingling   Aggravating factors: Getting up in the morning or laying  Relieving factors: Cervical pillow and tylenol    PRECAUTIONS: None  RED FLAGS: Cervical red flags: None       WEIGHT BEARING RESTRICTIONS: No  FALLS:  Has patient fallen in last 6 months? No  LIVING ENVIRONMENT: Lives with: Did not ask  Lives in: Did not ask  Stairs: Did not ask   Has following equipment at home: None  OCCUPATION: Desk   PLOF: Independent  PATIENT GOALS: She wants to feel less pain in her left neck and to not have it stop her from sleeping. She wants to also know how to correct posture and tingling.   NEXT MD VISIT: Did not ask    OBJECTIVE:  Note: Objective measures were completed at Evaluation unless otherwise noted.  VITALS: BP 111/73    DIAGNOSTIC FINDINGS:  M54.2 (ICD-10-CM) - Neck pain on left side  Left side neck pain that is non-radiating   CLINICAL DATA:  left sided neck pain with some radicular symptoms   EXAM: CERVICAL SPINE - COMPLETE 4+ VIEW   COMPARISON:  None Available.   FINDINGS: No fracture, dislocation, or prevertebral soft tissue swelling. Mild narrowing of C3-4 and C5-6 interspaces. Minimal anterolisthesis C4-5.   IMPRESSION: 1. No acute findings. 2. Mild C3-4 and C5-6 disc space narrowing.     Electronically Signed   By: JONETTA Faes M.D.   On: 09/27/2023 10:0  PATIENT SURVEYS:  NDI:  NECK DISABILITY INDEX  Date: 10/11/23 Score  Pain intensity 1 = The pain is very mild at the moment  2. Personal care (washing, dressing, etc.) 0 = I can look after myself normally without causing extra pain  3. Lifting 4 =  I can only lift very light weights  4. Reading 1 = I can read as much as I want to with  slight pain in my neck  5. Headaches 0 = I have no headaches at all  6. Concentration 0 =  I can concentrate fully when I want to with no difficulty  7. Work 0 =  I can do as much work as I want to  8. Driving 0 = I can drive my car without any neck pain  9. Sleeping 2 = My sleep is mildly disturbed (1-2 hrs sleepless)  10. Recreation 1 =  I am able to engage in all my recreation activities, with some pain in my neck  Total (9/50) x 100 = 18%    Minimum Detectable Change (90% confidence): 5 points or 10% points  COGNITION: Overall cognitive status: Within functional limits for tasks assessed  SENSATION: WFL, intermittent n/t along C3-C5 dermatomes    POSTURE: rounded shoulders  PALPATION: No areas TTP     CERVICAL ROM:   Active ROM A/PROM (deg) eval A/PROM  11/14/23 A/PROM 11/22/23  Flexion 45    Extension 45    Right lateral flexion 35 40 45/45  Left lateral flexion 45    Right rotation 60    Left rotation 58     (Blank rows = not tested)  UPPER EXTREMITY ROM:  Active ROM Right eval Left eval  Shoulder flexion    Shoulder extension    Shoulder abduction    Shoulder adduction    Shoulder extension    Shoulder internal rotation    Shoulder external rotation    Elbow flexion    Elbow extension    Wrist flexion    Wrist extension    Wrist ulnar deviation    Wrist radial deviation    Wrist pronation    Wrist supination     (Blank rows = not tested)  Combined ER  Occiput  Occiput  Combined IR    PSIS      PSIS   UPPER EXTREMITY MMT:  MMT Right eval Left eval Right  11/22/23 Left  11/22/23    Shoulder flexion 4+ 4+    Shoulder extension 4+ 4+    Shoulder abduction 4+ 4+    Shoulder adduction      Shoulder extension      Shoulder internal rotation 4 4 4+ 4+  Shoulder external rotation 4 4 4+ 4+  Middle trapezius 4 4 4+ 4+  Lower trapezius 4- 4- 4 4  Elbow flexion 4 4    Elbow extension 4 4    Wrist flexion 4 4    Wrist extension 4 4    Wrist ulnar  deviation      Wrist radial deviation      Wrist pronation      Wrist supination      Grip strength Good  Good      (Blank rows = not tested)  CERVICAL SPECIAL TESTS:  Cranial cervical flexion test: NT, Upper limb tension test (ULTT): Positive, and Spurling's test: Negative    TREATMENT DATE:   12/14/23: THEREX  OMEGA Lat Pull Down #20 1 x 10   OMEGA Cable Machine with Face Pulls #10 lbs 1 x 10    OMEGA  Scapular Rows #20 1 x 10   -min VC to stop retraction when elbows reach thoracic side wall   Shoulder Front Raises with #2 DB 2 x 10   -min VC to maintain extended elbows    Shoulder Lateral Raises in the plane of Scaption #1 DB 2 x 10    SELF CARE HOME MANAGEMENT  -Instruction about how to accurately progress exercises by using RPE scale or 8-10 reps without compensation and unable to do anymore.  -Instruction to increase resistance by 10% or until you cannot complete more than 8 to 10 reps       PATIENT EDUCATION:  Education details: Form and technique for correct performance of exercise and explanation of about cervical radiculopathy  Person educated: Patient Education method: Explanation, Demonstration, Verbal cues, and Handouts Education comprehension: verbalized understanding, returned demonstration, and verbal cues required  HOME EXERCISE PROGRAM: Access Code: [email or web address] URL: [email or web address] Date: 12/14/2023 Prepared by: Toribio Servant  Program Notes Cable Machine with face pulls #10 lbs 3 x 10    Exercises - Supine Suboccipital Release with Tennis Balls  -  1 x daily - 7 x weekly - 45-60 sec hold - Median Nerve Flossing - Tray  - 1 x daily - 7 x weekly - 10 reps - 3 sec hold - Seated Cervical Sidebending Stretch  - 1 x daily - 7 x weekly - 2-3 reps - 60 sec  hold - Seated Levator Scapulae Stretch  - 1 x daily - 7 x weekly - 2-3 reps - 60 sec  hold - Seated Cervical Retraction  - 3-4 x weekly - 3 sets - 10 reps - 2 sec  hold - Supine Shoulder  Flexion Extension Full Range AROM  - 3-4 x weekly - 3 sets - 10 reps - Bicep Curl to Shoulder Press with Dumbbells  - 3-4 x weekly - 3 sets - 10 reps - Shoulder Flexion to 180 standing against wall  - 3-4 x weekly - 3 sets - 10 reps - Scaption with Dumbbells  - 3-4 x weekly - 3 sets - 10 reps - Bent Over Single Arm Shoulder Row with Dumbbell  - 3-4 x weekly - 3 sets - 10 reps - Face Pulls  - 3-4 x weekly - 3 sets - 10 reps - Standing Shoulder Horizontal Abduction with Resistance  - 3-4 x weekly - 3 sets - 10 reps - Lat Pull Down   - 3-4 x weekly - 3 sets - 10 reps - Sidelying Shoulder ER with Towel and Dumbbell  - 3-4 x weekly - 3 sets - 10 reps - Sidelying Shoulder ER with Towel and Dumbbell (Mirrored)  - 3-4 x weekly - 3 sets - 10 reps - Lat Pull Down  - 3-4 x weekly - 3 sets - 10 reps - Seated Row Cable Machine  - 3-4 x weekly - 3 sets - 10 reps  Patient Education - Setting Up Your Workstation: Monitor - Development worker, international aid: Chair   ASSESSMENT:  CLINICAL IMPRESSION: Pt has now met nearly all her rehab goals with improvement in perception of neck function. PT spent remainder of session focusing on educating patient on shoulder and periscapular strengthening exercises which patient was able to demonstrate without compensation and without an increase in her pain. She is now ready for discharge and she has been educated on progressions for her home exercise plan so she can continue to improve shoulder and periscapular strength to maintain cervical function.   OBJECTIVE IMPAIRMENTS: decreased ROM, decreased strength, hypomobility, impaired sensation, impaired UE functional use, postural dysfunction, and pain.   ACTIVITY LIMITATIONS: carrying, lifting, sleeping, bed mobility, and reach over head  PARTICIPATION LIMITATIONS: occupation  PERSONAL FACTORS: Age, Fitness, and 1 comorbidity: h/o cancer are also affecting patient's functional outcome.   REHAB POTENTIAL: Good  CLINICAL  DECISION MAKING: Stable/uncomplicated  EVALUATION COMPLEXITY: Low   GOALS: Goals reviewed with patient? No  SHORT TERM GOALS: Target date: 10/25/2023  Patient will demonstrate undestanding of home exercise plan by performing exercises correctly with evidence of good carry over with min to no verbal or tactile cues .   Baseline: NT 10/17/23: Performing independently   Goal status: ACHIEVED    2.  Patient will demonstrate understanding of correct ergonomic step for home workstation to prevent abnormal postures and buildup of muscle imbalances.  Baseline: NT 10/25/23: Reviewed work place setup and modifications she could make to improve posture   Goal status: ACHIEVED        LONG TERM GOALS: Target date: 01/03/2024  Patient will reduce neck disability index by >=5 pts or 10%  as evidence of improved neck function that is not due to statistical error or chance. Baseline: 9/50 (18%) 11/14/23: 9/50 (18%)  12/14/23: 5/50 (10%) Goal status: PARTIALLY MET    2.  Patient will improve right side bend AROM to be within normal limits as evidence of improved muscular tension and cervical function. Baseline: Cervical SB R/L 35/45  11/14/23: Cervical SB R 40 deg 11/22/23: Cervical R SB 45/45   Goal status: ACHIEVED     3.  Patient will improve periscapular strength by 1/3 grade MMT (ie 4- to 4) for improved cervical stability and posture to decrease incidence of left sided neck pain and radicular symptoms. Baseline: Lower Trap  R/L 4-/4-, Mid Trap 4/4  11/22/23: Lower Trap R/L 4/4 11/22/23: Lower Trap R/L 4/4, Mid Trap R/L 4+/4+, Shoulder ER at 0 deg R/L 4+/4+, Shoulder IR at 0 deg R/L 4+/4+   Goal status: ACHIEVED       PLAN:  PT FREQUENCY: 1-2x/week  PT DURATION: 12 weeks  PLANNED INTERVENTIONS: 97164- PT Re-evaluation, 97750- Physical Performance Testing, 97110-Therapeutic exercises, 97530- Therapeutic activity, 97112- Neuromuscular re-education, 97535- Self Care, 02859- Manual therapy, [id number]-  Aquatic Therapy, H9716- Electrical stimulation (unattended), [id number]- Electrical stimulation (manual), [id number]- Traction (mechanical), 20560 (1-2 muscles), 20561 (3+ muscles)- Dry Needling, Patient/Family education, Joint mobilization, Joint manipulation, Spinal manipulation, Spinal mobilization, Vestibular training, Cryotherapy, and Moist heat  PLAN FOR NEXT SESSION:  Discharge from PT.    Toribio Servant PT, DPT  [facility] [address], [place], [address] Phone: [phone]   Fax:  [phone]

## 2023-12-18 ENCOUNTER — Ambulatory Visit: Admitting: Physical Therapy

## 2023-12-18 DIAGNOSIS — H00015 Hordeolum externum left lower eyelid: Secondary | ICD-10-CM | POA: Diagnosis not present

## 2023-12-18 DIAGNOSIS — H00014 Hordeolum externum left upper eyelid: Secondary | ICD-10-CM | POA: Diagnosis not present

## 2023-12-21 ENCOUNTER — Ambulatory Visit: Admitting: Physical Therapy

## 2024-01-21 ENCOUNTER — Other Ambulatory Visit: Payer: Self-pay | Admitting: Family Medicine

## 2024-02-08 ENCOUNTER — Other Ambulatory Visit: Payer: Self-pay | Admitting: Family Medicine

## 2024-02-08 DIAGNOSIS — Z1231 Encounter for screening mammogram for malignant neoplasm of breast: Secondary | ICD-10-CM

## 2024-03-22 ENCOUNTER — Ambulatory Visit
Admission: RE | Admit: 2024-03-22 | Discharge: 2024-03-22 | Disposition: A | Discharge status: Home / Self Care | Source: Ambulatory Visit | Attending: Family Medicine | Admitting: Family Medicine

## 2024-03-22 DIAGNOSIS — Z1231 Encounter for screening mammogram for malignant neoplasm of breast: Secondary | ICD-10-CM | POA: Insufficient documentation

## 2024-03-28 ENCOUNTER — Ambulatory Visit: Payer: Self-pay | Admitting: Family Medicine

## 2024-04-30 ENCOUNTER — Ambulatory Visit: Payer: Self-pay

## 2024-04-30 ENCOUNTER — Other Ambulatory Visit: Payer: Self-pay | Admitting: Family Medicine

## 2024-04-30 MED ORDER — PANTOPRAZOLE SODIUM 40 MG PO TBEC: 40.0000 mg | DELAYED_RELEASE_TABLET | Freq: Every day | ORAL | 0 refills | Status: DC | PRN

## 2024-04-30 NOTE — Telephone Encounter (Signed)
 Refilled protonix    Will see as planned

## 2024-04-30 NOTE — Telephone Encounter (Signed)
 FYI Only or Action Required?: scheduled 1/28  Patient was last seen in primary care on 09/27/2023 by Randeen Laine LABOR, MD.  Called Nurse Triage reporting Eye Problem.  Symptoms began several months ago.  Interventions attempted: Rest, hydration, or home remedies.  Symptoms are: gradually worsening.  Triage Disposition: See Physician Within 24 Hours  Patient/caregiver understands and will follow disposition?: Yes, will follow disposition  Reason for Triage: Patient thinks she has an eye infection and heartburn-she has a stye on and off since August. States her left eye is swollen    Reason for Disposition  MODERATE-SEVERE eyelid swelling on one side  (Exception: Due to a mosquito bite.)  Answer Assessment - Initial Assessment Questions 1. ONSET: When did the swelling start? (e.g., minutes, hours, days)     Intermittently since Aug 2. LOCATION: What part of the eyelids is swollen?     L eye 3. SEVERITY: How swollen is it?     moderate 4. ITCHING: Is there any itching? If Yes, ask: How much?   (Scale 1-10; mild, moderate or severe)     denies 5. PAIN: Is the swelling painful to touch? If Yes, ask: How painful is it?   (Scale 1-10; mild, moderate or severe)     Mild from swelling per pt 6. FEVER: Do you have a fever? If Yes, ask: What is it, how was it measured, and when did it start?      denies 7. CAUSE: What do you think is causing the swelling?     Unsure, thought stye 8. RECURRENT SYMPTOM: Have you had eyelid swelling before? If Yes, ask: When was the last time? What happened that time?     denies 9. OTHER SYMPTOMS: Do you have any other symptoms? (e.g., blurred vision, eye discharge, rash, runny nose)     Denies vision changes, pt does states there is discharge-clear  Pt states that she is also needing more protonix , pt states this she takes this for heartburn, states that she has been dx with heartburn and this feels exactly the same. Pt states  that she needs her rx refilled.  Protocols used: Eye - Swelling-A-AH

## 2024-05-01 ENCOUNTER — Ambulatory Visit: Admitting: Family Medicine

## 2024-05-01 ENCOUNTER — Encounter: Payer: Self-pay | Admitting: Family Medicine

## 2024-05-01 VITALS — BP 122/68 | HR 91 | Temp 98.1°F | Ht 63.0 in | Wt 140.4 lb

## 2024-05-01 DIAGNOSIS — H00014 Hordeolum externum left upper eyelid: Secondary | ICD-10-CM

## 2024-05-01 DIAGNOSIS — H0014 Chalazion left upper eyelid: Secondary | ICD-10-CM | POA: Diagnosis not present

## 2024-05-01 DIAGNOSIS — K219 Gastro-esophageal reflux disease without esophagitis: Secondary | ICD-10-CM

## 2024-05-01 DIAGNOSIS — H00019 Hordeolum externum unspecified eye, unspecified eyelid: Secondary | ICD-10-CM | POA: Insufficient documentation

## 2024-05-01 DIAGNOSIS — H0016 Chalazion left eye, unspecified eyelid: Secondary | ICD-10-CM | POA: Insufficient documentation

## 2024-05-01 MED ORDER — PANTOPRAZOLE SODIUM 40 MG PO TBEC: 40.0000 mg | DELAYED_RELEASE_TABLET | Freq: Every day | ORAL | 2 refills | Status: AC | PRN

## 2024-05-01 MED ORDER — AMOXICILLIN-POT CLAVULANATE 875-125 MG PO TABS: 1.0000 | ORAL_TABLET | Freq: Two times a day (BID) | ORAL | 0 refills | Status: AC

## 2024-05-01 NOTE — Assessment & Plan Note (Addendum)
 Since August-improved , medial eyelid , has seen oph provider and was treated with oral amoxicillin  Per pt overall improving  Encouraged to update if the symptoms return or worsen (especially if vision is affected)

## 2024-05-01 NOTE — Assessment & Plan Note (Addendum)
 Heartburn returned/worsened recently after eating differently over the holidays and drinking red wine That was followed by a GI virus which worsened heartburn as well Patient ran out of Protonix  40 mg and took Prilosec OTC instead which does not work as well Refill Protonix  40 mg to take 1 daily as needed Encouraged to call if this does not help Encouraged to avoid triggers which she is doing

## 2024-05-01 NOTE — Patient Instructions (Signed)
 Keep eyelid clean with eye friendly cleanser (J and J baby shampoo)  Continue to use warm compresses   Take the oral augmentin  as directed   Return to eye doctor if the chalazion does not continue to improve  If any vision change let us  know   Update if stye not starting to improve in a week or if worsening  Or if worse pink eye symptoms (itchy/pain/ discharge)   I refilled protonix  Try to avoid your heartburn triggers

## 2024-05-01 NOTE — Progress Notes (Signed)
 "  Subjective:    Patient ID: Debbie Richardson, female    DOB: 25-Jul-1960, 64 y.o.   MRN: [id number]  HPI  Wt Readings from Last 3 Encounters:  05/01/24 140 lb 6 oz (63.7 kg)  09/27/23 140 lb 4 oz (63.6 kg)  08/29/23 141 lb (64 kg)   24.87 kg/m  Vitals:   05/01/24 1500  BP: 122/68  Pulse: 91  Temp: 98.1 F (36.7 C)  SpO2: 98%     Pt presents with  Left eye complaint / left eye swelling following a stye (with a new stye now) Heartburn    Left eye problem  Started in August as a big stye  It was bigger than usual  She saw oph-put her on doxy (did not tolerate) , then amox- helped but did not cure Went back after that  Bump continues after that   Now more inflamed again  New stye in corner of eye Can still feel the older one   No change in vision   Last few days -some discharge from eye (clear) Eye itself is red in am  Uses refresh art tears Does not wear contacts   Has not had uri symptoms lately      Goes through phases with heartburn Recently worse  Holiday eating  Red wine Then has a GI virus -made it worse  Ran out of protonix  40 ,   got some prilosec over the counter-does not work as well  No abdominal pain  No antacids   Bananas are also trigger    GERD Takes protonix  40 mg prn  (usually fine if she eats right)     History of ovarian cancer  Had normal CA 125 in 04/2023 at 8.9 Patient Active Problem List   Diagnosis Date Noted   Neck pain on left side 09/27/2023   Tingling sensation 09/27/2023   Acute foot pain, left 08/29/2023   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   Rash 07/05/2023   Vitamin D  deficiency 06/30/2023   B12 deficiency 06/30/2023   Current use of proton pump inhibitor 06/29/2023   Estrogen deficiency 06/29/2023   Family history of osteoporosis 06/29/2023   External hemorrhoid 09/21/2022   IBS (irritable bowel syndrome) 09/21/2022   Genetic testing 05/26/2022   Routine general medical examination at a health care  facility 04/25/2022   Orthopnea 12/30/2020   Lipid screening 06/21/2019   History of anaphylaxis 06/21/2019   Screening mammogram, encounter for 01/07/2019   Encounter for follow-up surveillance of ovarian cancer 10/03/2018   Menopause 09/14/2016   Neoplasm, ovary, malignant, unspecified laterality (HCC) 12/09/2015   Status post laparoscopic hysterectomy 10/29/2014   GERD (gastroesophageal reflux disease) 05/01/2007   PSORIASIS NEC 12/07/2006   Past Medical History:  Diagnosis Date   Anxiety    Arthritis    Benign fundic gland polyps of stomach    Dizziness    vertigo   Gallstones    GERD (gastroesophageal reflux disease)    Ovarian cancer (HCC) 2016   Personal history of chemotherapy    Routine general medical examination at a health care facility 04/25/2022   Past Surgical History:  Procedure Laterality Date   CESAREAN SECTION     CHOLECYSTECTOMY     COLONOSCOPY     COLONOSCOPY WITH PROPOFOL  N/A 04/29/2021   Procedure: COLONOSCOPY WITH PROPOFOL ;  Surgeon: Therisa Bi, MD;  Location: [facility] ENDOSCOPY;  Service: Gastroenterology;  Laterality: N/A;   DIAGNOSTIC LAPAROSCOPY     LAPAROSCOPIC HYSTERECTOMY N/A 10/29/2014   Procedure:  HYSTERECTOMY TOTAL LAPAROSCOPIC, OMENTUMECTOMY;  Surgeon: Webb Isidor Constable, MD;  Location: [facility] ORS;  Service: Gynecology;  Laterality: N/A;   LAPAROSCOPIC OOPHERECTOMY     OOPHORECTOMY     PORTA CATH REMOVAL N/A 04/26/2018   Procedure: PORTA CATH REMOVAL;  Surgeon: Marea Selinda RAMAN, MD;  Location: [facility] INVASIVE CV LAB;  Service: Cardiovascular;  Laterality: N/A;   UPPER GI ENDOSCOPY     Social History[1] Family History  Problem Relation Age of Onset   Hypertension Mother        10yo    Hyperlipidemia Mother    GER disease Mother    Heart disease Father        3yo   Breast cancer Sister 17   Heart disease Paternal Grandmother    Breast cancer Paternal Grandmother 7       deceased 79   Kidney disease Neg Hx    Liver disease Neg Hx     Colon cancer Neg Hx    Colon polyps Neg Hx    Esophageal cancer Neg Hx    Pancreatic cancer Neg Hx    Allergies[2] Medications Ordered Prior to Encounter[3]  Review of Systems  Constitutional:  Negative for activity change, appetite change, fatigue, fever and unexpected weight change.  HENT:  Negative for congestion, ear pain, rhinorrhea, sinus pressure and sore throat.   Eyes:  Positive for discharge. Negative for photophobia, pain, redness, itching and visual disturbance.  Respiratory:  Negative for cough, shortness of breath and wheezing.   Cardiovascular:  Negative for chest pain and palpitations.  Gastrointestinal:  Negative for abdominal distention, abdominal pain, anal bleeding, blood in stool, constipation, diarrhea, nausea, rectal pain and vomiting.       Heartburn    Endocrine: Negative for polydipsia and polyuria.  Genitourinary:  Negative for dysuria, frequency and urgency.  Musculoskeletal:  Negative for arthralgias, back pain and myalgias.  Skin:  Negative for pallor and rash.  Allergic/Immunologic: Negative for environmental allergies.  Neurological:  Negative for dizziness, syncope and headaches.  Hematological:  Negative for adenopathy. Does not bruise/bleed easily.  Psychiatric/Behavioral:  Negative for decreased concentration and dysphoric mood. The patient is not nervous/anxious.        Objective:   Physical Exam Constitutional:      General: She is not in acute distress.    Appearance: Normal appearance. She is normal weight. She is not ill-appearing or diaphoretic.  Eyes:     General: Vision grossly intact. Gaze aligned appropriately. No scleral icterus.       Right eye: No discharge.        Left eye: Hordeolum present.No discharge.     Extraocular Movements: Extraocular movements intact.     Conjunctiva/sclera: Conjunctivae normal.     Right eye: Right conjunctiva is not injected. No exudate or hemorrhage.    Left eye: Left conjunctiva is not injected.  No exudate or hemorrhage.    Pupils: Pupils are equal, round, and reactive to light.     Comments: Left eyelid is mildly erythematous and swollen  Tender along lateral upper lid edge - hordeolum noted /non draining   Small bump felt in medial upper lid where previous chalazion was (non tender)   No significant conj injection   Abdominal:     General: Abdomen is flat. Bowel sounds are normal. There is no distension or abdominal bruit.     Palpations: There is no hepatomegaly, splenomegaly, mass or pulsatile mass.     Tenderness: There is no abdominal tenderness.  Skin:    Findings: No bruising, erythema or rash.  Neurological:     Mental Status: She is alert.     Cranial Nerves: No cranial nerve deficit.  Psychiatric:        Mood and Affect: Mood normal.           Assessment & Plan:   Assessment & Plan Gastroesophageal reflux disease, unspecified whether esophagitis present Heartburn returned/worsened recently after eating differently over the holidays and drinking red wine That was followed by a GI virus which worsened heartburn as well Patient ran out of Protonix  40 mg and took Prilosec OTC instead which does not work as well Refill Protonix  40 mg to take 1 daily as needed Encouraged to call if this does not help Encouraged to avoid triggers which she is doing    Hordeolum externum of left upper eyelid Lateral upper left lid  Complicated by past chalazion (medial) still present from summer -which required oral antibiotic from oph provider  Lid is erythematous and swollen  Eye exam otherwise reassuring  Will treat with Augmentin  twice daily for 7 days and update Encouraged use of warm compresses and cleansing with eye friendly cleanser like J&J baby shampoo Update if not starting to improve in a week or if worsening  Call back and Er precautions noted in detail today      Chalazion of left upper eyelid Since August-improved , medial eyelid , has seen oph provider and  was treated with oral amoxicillin  Per pt overall improving  Encouraged to update if the symptoms return or worsen (especially if vision is affected)            [1]  Social History Tobacco Use   Smoking status: Former    Current packs/day: 0.00    Types: Cigarettes    Quit date: 04/05/1991    Years since quitting: 33.0   Smokeless tobacco: Never  Vaping Use   Vaping status: Never Used  Substance Use Topics   Alcohol use: Yes    Alcohol/week: 0.0 standard drinks of alcohol    Comment: 1 drink of wine daily   Drug use: No  [2]  Allergies Allergen Reactions   Ciprofloxacin  Anaphylaxis   Paclitaxel Shortness Of Breath and Other (See Comments)    Other reaction(s): Tight chest (finding) Chest and facial flushing Flushing, chest tightness, SOB w/ Taxol on 05/26/14 at [facility]   Flagyl  [Metronidazole ] Other (See Comments)    headache   Nutmeg Oil (Myristica Oil) Other (See Comments)    Tightness in chest and throat and difficulty swallowing and breathing.  [3]  Current Outpatient Medications on File Prior to Visit  Medication Sig Dispense Refill   cholecalciferol (VITAMIN D3) 25 MCG (1000 UNIT) tablet Take 2,000 Units by mouth daily.     cyanocobalamin  (VITAMIN B12) 1000 MCG tablet Take 1,000 mcg by mouth daily.     hydrocortisone  (ANUSOL -HC) 2.5 % rectal cream Apply 1 Application topically 2 (two) times daily. For 1 week (Patient taking differently: Apply 1 Application topically daily as needed.) 30 g 0   pantoprazole  (PROTONIX ) 40 MG tablet Take 1 tablet (40 mg total) by mouth daily as needed. 90 tablet 0   No current facility-administered medications on file prior to visit.   "

## 2024-05-01 NOTE — Assessment & Plan Note (Addendum)
 Lateral upper left lid  Complicated by past chalazion (medial) still present from summer -which required oral antibiotic from oph provider  Lid is erythematous and swollen  Eye exam otherwise reassuring  Will treat with Augmentin  twice daily for 7 days and update Encouraged use of warm compresses and cleansing with eye friendly cleanser like J&J baby shampoo Update if not starting to improve in a week or if worsening  Call back and Er precautions noted in detail today

## 2024-06-05 ENCOUNTER — Other Ambulatory Visit

## 2024-06-05 ENCOUNTER — Ambulatory Visit

## 2024-06-25 ENCOUNTER — Other Ambulatory Visit

## 2024-07-02 ENCOUNTER — Encounter: Admitting: Family Medicine
